# Patient Record
Sex: Male | Born: 1955 | Race: White | Hispanic: No | Marital: Married | Smoking: Former smoker
Health system: Southern US, Community
[De-identification: ages and names within clinical notes are randomized; demographics above are authoritative.]

## PROBLEM LIST (undated history)

## (undated) ENCOUNTER — Emergency Department (HOSPITAL_COMMUNITY): Payer: Self-pay | Source: Home / Self Care

## (undated) DIAGNOSIS — Z8719 Personal history of other diseases of the digestive system: Secondary | ICD-10-CM

## (undated) DIAGNOSIS — K219 Gastro-esophageal reflux disease without esophagitis: Secondary | ICD-10-CM

## (undated) DIAGNOSIS — H53419 Scotoma involving central area, unspecified eye: Secondary | ICD-10-CM

## (undated) DIAGNOSIS — G629 Polyneuropathy, unspecified: Secondary | ICD-10-CM

## (undated) DIAGNOSIS — G4733 Obstructive sleep apnea (adult) (pediatric): Secondary | ICD-10-CM

## (undated) DIAGNOSIS — H269 Unspecified cataract: Secondary | ICD-10-CM

## (undated) DIAGNOSIS — Z860100 Personal history of colon polyps, unspecified: Secondary | ICD-10-CM

## (undated) DIAGNOSIS — E119 Type 2 diabetes mellitus without complications: Secondary | ICD-10-CM

## (undated) DIAGNOSIS — H409 Unspecified glaucoma: Secondary | ICD-10-CM

## (undated) DIAGNOSIS — D509 Iron deficiency anemia, unspecified: Secondary | ICD-10-CM

## (undated) DIAGNOSIS — Z87442 Personal history of urinary calculi: Secondary | ICD-10-CM

## (undated) DIAGNOSIS — I639 Cerebral infarction, unspecified: Secondary | ICD-10-CM

## (undated) DIAGNOSIS — Z973 Presence of spectacles and contact lenses: Secondary | ICD-10-CM

## (undated) DIAGNOSIS — K5790 Diverticulosis of intestine, part unspecified, without perforation or abscess without bleeding: Secondary | ICD-10-CM

## (undated) DIAGNOSIS — K5792 Diverticulitis of intestine, part unspecified, without perforation or abscess without bleeding: Secondary | ICD-10-CM

## (undated) DIAGNOSIS — N189 Chronic kidney disease, unspecified: Secondary | ICD-10-CM

## (undated) DIAGNOSIS — F419 Anxiety disorder, unspecified: Secondary | ICD-10-CM

## (undated) DIAGNOSIS — D649 Anemia, unspecified: Secondary | ICD-10-CM

## (undated) DIAGNOSIS — T7840XA Allergy, unspecified, initial encounter: Secondary | ICD-10-CM

## (undated) HISTORY — DX: Diverticulitis of intestine, part unspecified, without perforation or abscess without bleeding: K57.92

## (undated) HISTORY — DX: Unspecified glaucoma: H40.9

## (undated) HISTORY — DX: Unspecified cataract: H26.9

## (undated) HISTORY — PX: COLONOSCOPY: SHX174

## (undated) HISTORY — DX: Allergy, unspecified, initial encounter: T78.40XA

## (undated) HISTORY — DX: Type 2 diabetes mellitus without complications: E11.9

## (undated) HISTORY — DX: Gastro-esophageal reflux disease without esophagitis: K21.9

## (undated) HISTORY — PX: OTHER SURGICAL HISTORY: SHX169

## (undated) HISTORY — DX: Diverticulosis of intestine, part unspecified, without perforation or abscess without bleeding: K57.90

## (undated) HISTORY — DX: Anemia, unspecified: D64.9

## (undated) HISTORY — DX: Cerebral infarction, unspecified: I63.9

## (undated) HISTORY — PX: UPPER GASTROINTESTINAL ENDOSCOPY: SHX188

## (undated) HISTORY — PX: WISDOM TOOTH EXTRACTION: SHX21

---

## 2001-05-24 ENCOUNTER — Encounter: Admission: RE | Admit: 2001-05-24 | Discharge: 2001-06-23 | Payer: Self-pay | Admitting: Family Medicine

## 2003-09-29 ENCOUNTER — Encounter: Admission: RE | Admit: 2003-09-29 | Discharge: 2003-09-29 | Payer: Self-pay | Admitting: General Surgery

## 2003-10-20 ENCOUNTER — Ambulatory Visit (HOSPITAL_COMMUNITY): Admission: RE | Admit: 2003-10-20 | Discharge: 2003-10-20 | Payer: Self-pay | Admitting: Gastroenterology

## 2008-03-16 ENCOUNTER — Ambulatory Visit: Payer: Self-pay | Admitting: Gastroenterology

## 2008-03-16 DIAGNOSIS — K5900 Constipation, unspecified: Secondary | ICD-10-CM | POA: Insufficient documentation

## 2008-03-16 DIAGNOSIS — E119 Type 2 diabetes mellitus without complications: Secondary | ICD-10-CM | POA: Insufficient documentation

## 2008-03-16 DIAGNOSIS — Z794 Long term (current) use of insulin: Secondary | ICD-10-CM | POA: Insufficient documentation

## 2008-04-12 ENCOUNTER — Encounter: Payer: Self-pay | Admitting: Gastroenterology

## 2008-04-12 ENCOUNTER — Telehealth: Payer: Self-pay | Admitting: Gastroenterology

## 2008-04-18 ENCOUNTER — Telehealth: Payer: Self-pay | Admitting: Gastroenterology

## 2008-04-19 ENCOUNTER — Ambulatory Visit: Payer: Self-pay | Admitting: Gastroenterology

## 2009-04-18 ENCOUNTER — Emergency Department (HOSPITAL_COMMUNITY): Admission: EM | Admit: 2009-04-18 | Discharge: 2009-04-18 | Payer: Self-pay | Admitting: Family Medicine

## 2011-03-04 LAB — POCT URINALYSIS DIP (DEVICE)
Bilirubin Urine: NEGATIVE
Glucose, UA: 500 mg/dL — AB
Hgb urine dipstick: NEGATIVE
Ketones, ur: NEGATIVE mg/dL
Nitrite: NEGATIVE
Protein, ur: NEGATIVE mg/dL
Specific Gravity, Urine: 1.01 (ref 1.005–1.030)
Urobilinogen, UA: 0.2 mg/dL (ref 0.0–1.0)
pH: 5.5 (ref 5.0–8.0)

## 2012-02-09 ENCOUNTER — Encounter: Payer: Self-pay | Admitting: Cardiology

## 2012-09-01 ENCOUNTER — Encounter: Payer: Self-pay | Admitting: Gastroenterology

## 2012-09-24 ENCOUNTER — Encounter: Payer: Self-pay | Admitting: Gastroenterology

## 2012-10-04 ENCOUNTER — Ambulatory Visit (INDEPENDENT_AMBULATORY_CARE_PROVIDER_SITE_OTHER): Payer: 59 | Admitting: Gastroenterology

## 2012-10-04 ENCOUNTER — Encounter: Payer: Self-pay | Admitting: Gastroenterology

## 2012-10-04 VITALS — BP 126/60 | HR 104 | Ht 69.0 in | Wt 198.8 lb

## 2012-10-04 DIAGNOSIS — K219 Gastro-esophageal reflux disease without esophagitis: Secondary | ICD-10-CM | POA: Insufficient documentation

## 2012-10-04 DIAGNOSIS — R1032 Left lower quadrant pain: Secondary | ICD-10-CM

## 2012-10-04 MED ORDER — OMEPRAZOLE-SODIUM BICARBONATE 40-1100 MG PO CAPS: ORAL_CAPSULE | ORAL | Status: DC

## 2012-10-04 NOTE — Assessment & Plan Note (Addendum)
Pt has long-standing reflux. He's currently more symptomatic, primarily at night. Although he's been taking ibuprofen for couple of years, this may be contributing to his symptoms.  Recommendations #1 D/C protonix and begin Zegerid 40 mg every morning and each bedtime #2 pt was instructed to take his sinus medication earlier in the evening #3 if symptoms are not improved we'll consider holding Advil and possibly doing an upper endoscopy

## 2012-10-04 NOTE — Patient Instructions (Addendum)
Discontinue Protonix Follow in one month Call back in 1 week Take advil early in the day as needed We are giving you Zegerid samples today

## 2012-10-04 NOTE — Progress Notes (Signed)
History of Present Illness: Mr. Fandrich is a pleasant 56 year old white male with diabetes and history of esophageal stricture here for evaluation of pyrosis. Symptoms are primarily nocturnal. He's been taking Protonix twice a day for years. He also takes a sinus medication that contains ibuprofen before going to bed. He's been on this regimen for a couple of years as well. He denies dysphagia sore throat or cough. Claims to have had several episodes of low-grade diverticulitis manifested by discomfort in the left lower quadrant. He's been on antibiotics in the past for this. Endoscopy 2004 demonstrated a distal esophageal stricture which was dilated.     Past Medical History  Diagnosis Date  . DM (diabetes mellitus)   . Kidney stones   . Diverticulosis   . Diverticulitis   . GERD (gastroesophageal reflux disease)   . Cataracts, bilateral    Past Surgical History  Procedure Date  . Cataract surgery     bilateral   family history includes Colon polyps in his father and Diabetes in his mother and sister. Current Outpatient Prescriptions  Medication Sig Dispense Refill  . gabapentin (NEURONTIN) 300 MG capsule Take 300 mg by mouth daily.      Marland Kitchen glimepiride (AMARYL) 4 MG tablet Take 4 mg by mouth 2 (two) times daily.      . Liraglutide (VICTOZA Caroline) Inject 1.8 mLs into the skin daily.      . metFORMIN (GLUCOPHAGE) 1000 MG tablet Take 500 mg by mouth 2 (two) times daily with a meal. Take 4 tablets by mouth every night      . Pantoprazole Sodium (PROTONIX PO) Take 1 tablet by mouth 2 (two) times daily.      . sertraline (ZOLOFT) 25 MG tablet Take 25 mg by mouth daily.       Allergies as of 10/04/2012 - Review Complete 10/04/2012  Allergen Reaction Noted  . Aspirin  03/16/2008  . Penicillins  03/16/2008    reports that he quit smoking about 15 years ago. He has never used smokeless tobacco. He reports that he does not drink alcohol or use illicit drugs.     Review of Systems: Pertinent  positive and negative review of systems were noted in the above HPI section. All other review of systems were otherwise negative.  Vital signs were reviewed in today's medical record Physical Exam: General: Well developed , well nourished, no acute distress Head: Normocephalic and atraumatic Eyes:  sclerae anicteric, EOMI Ears: Normal auditory acuity Mouth: No deformity or lesions Neck: Supple, no masses or thyromegaly Lungs: Clear throughout to auscultation Heart: Regular rate and rhythm; no murmurs, rubs or bruits Abdomen: Soft, non tender and non distended. No masses, hepatosplenomegaly or hernias noted. Normal Bowel sounds. There is diastases recti Rectal:deferred Musculoskeletal: Symmetrical with no gross deformities  Skin: No lesions on visible extremities Pulses:  Normal pulses noted Extremities: No clubbing, cyanosis, edema or deformities noted Neurological: Alert oriented x 4, grossly nonfocal Cervical Nodes:  No significant cervical adenopathy Inguinal Nodes: No significant inguinal adenopathy Psychological:  Alert and cooperative. Normal mood and affect

## 2012-10-04 NOTE — Assessment & Plan Note (Signed)
The patient has a history of intermittent left lower quadrant pain attributed to diverticulitis.  Last colonoscopy 2009.  Recommendations #1 no further GI workup. Should he should have recurrent pain would consider CT scan to document diverticulitis.

## 2012-11-08 ENCOUNTER — Ambulatory Visit (INDEPENDENT_AMBULATORY_CARE_PROVIDER_SITE_OTHER): Payer: 59 | Admitting: Gastroenterology

## 2012-11-08 ENCOUNTER — Encounter: Payer: Self-pay | Admitting: Gastroenterology

## 2012-11-08 VITALS — BP 120/64 | HR 100 | Ht 69.0 in | Wt 196.0 lb

## 2012-11-08 DIAGNOSIS — K219 Gastro-esophageal reflux disease without esophagitis: Secondary | ICD-10-CM

## 2012-11-08 MED ORDER — OMEPRAZOLE-SODIUM BICARBONATE 40-1100 MG PO CAPS: ORAL_CAPSULE | ORAL | Status: DC

## 2012-11-08 NOTE — Progress Notes (Signed)
History of Present Illness:  The patient has returned for followup of pyrosis. On a regimen of Zegerid before breakfast and dinner he is symptom-free. He has no GI complaints.    Review of Systems: Pertinent positive and negative review of systems were noted in the above HPI section. All other review of systems were otherwise negative.    Current Medications, Allergies, Past Medical History, Past Surgical History, Family History and Social History were reviewed in [employer] electronic medical record  Vital signs were reviewed in today's medical record. Physical Exam: General: Well developed , well nourished, no acute distress

## 2012-11-08 NOTE — Assessment & Plan Note (Signed)
Symptoms are well controlled on twice a day Zegerid. Plan to reduce to once daily in the evenings. Patient is at liberty to switch to Zegerid every morning if he prefers.

## 2012-11-08 NOTE — Patient Instructions (Addendum)
Follow up as needed 90 day supply of Zegerid sent to [facility]

## 2013-01-17 ENCOUNTER — Telehealth: Payer: Self-pay | Admitting: Gastroenterology

## 2013-01-17 ENCOUNTER — Other Ambulatory Visit: Payer: Self-pay | Admitting: Gastroenterology

## 2013-01-17 DIAGNOSIS — H4010X0 Unspecified open-angle glaucoma, stage unspecified: Secondary | ICD-10-CM | POA: Insufficient documentation

## 2013-01-17 DIAGNOSIS — R109 Unspecified abdominal pain: Secondary | ICD-10-CM

## 2013-01-17 DIAGNOSIS — H53419 Scotoma involving central area, unspecified eye: Secondary | ICD-10-CM | POA: Insufficient documentation

## 2013-01-17 MED ORDER — CIPROFLOXACIN HCL 500 MG PO TABS: 500.0000 mg | ORAL_TABLET | Freq: Two times a day (BID) | ORAL | Status: AC

## 2013-01-17 NOTE — Telephone Encounter (Signed)
Begin Cipro 500 mg twice a day and schedule CT of the abdomen and pelvis. Carefully instructed to contact me if she's not improving over the next 24-48 hours

## 2013-01-17 NOTE — Telephone Encounter (Signed)
Pt states he is having a flare of his diverticulitis. Pt states that over the weekend he started having stomach pain, loose stools and chills. Pt is requesting some antibiotics be called in for him. Please advise.

## 2013-01-17 NOTE — Telephone Encounter (Signed)
Pt aware and rx sent to pharmacy. 

## 2013-05-16 ENCOUNTER — Other Ambulatory Visit: Payer: Self-pay | Admitting: Gastroenterology

## 2013-06-03 ENCOUNTER — Telehealth: Payer: Self-pay | Admitting: Gastroenterology

## 2013-06-06 NOTE — Telephone Encounter (Signed)
Dr Arlyce Dice, the CT was not ordered in February , 2014 after a Diverticulitis attack; he did take the Cipro. At that time he was having a fever and cramping. Today, he reports bouts of diarrhea and cramping now and then, but nothing like February; states he's avoiding salads. Do we order the CT since he's basicall asymptomatic? Thanks.

## 2013-06-06 NOTE — Telephone Encounter (Signed)
Informed pt no need for CT scan and he stated understanding.

## 2013-06-06 NOTE — Telephone Encounter (Signed)
No need for CT scan.

## 2013-12-09 ENCOUNTER — Other Ambulatory Visit: Payer: Self-pay | Admitting: Gastroenterology

## 2014-12-11 ENCOUNTER — Telehealth: Payer: Self-pay | Admitting: Gastroenterology

## 2014-12-11 MED ORDER — OMEPRAZOLE-SODIUM BICARBONATE 40-1100 MG PO CAPS: ORAL_CAPSULE | ORAL | Status: DC

## 2014-12-11 NOTE — Telephone Encounter (Signed)
LM that med was sent

## 2015-01-10 ENCOUNTER — Other Ambulatory Visit: Payer: Self-pay | Admitting: *Deleted

## 2015-01-10 MED ORDER — OMEPRAZOLE 40 MG PO CPDR: 40.0000 mg | DELAYED_RELEASE_CAPSULE | Freq: Two times a day (BID) | ORAL | Status: DC

## 2015-01-10 MED ORDER — SODIUM BICARBONATE 650 MG PO TABS: 650.0000 mg | ORAL_TABLET | Freq: Two times a day (BID) | ORAL | Status: DC

## 2015-01-31 ENCOUNTER — Ambulatory Visit (INDEPENDENT_AMBULATORY_CARE_PROVIDER_SITE_OTHER): Payer: 59 | Admitting: Gastroenterology

## 2015-01-31 ENCOUNTER — Encounter: Payer: Self-pay | Admitting: Gastroenterology

## 2015-01-31 VITALS — BP 130/70 | HR 104 | Ht 69.0 in | Wt 192.1 lb

## 2015-01-31 DIAGNOSIS — R12 Heartburn: Secondary | ICD-10-CM

## 2015-01-31 DIAGNOSIS — K219 Gastro-esophageal reflux disease without esophagitis: Secondary | ICD-10-CM

## 2015-01-31 NOTE — Patient Instructions (Signed)
Gastroesophageal Reflux Disease, Adult  Gastroesophageal reflux disease (GERD) happens when acid from your stomach flows up into the esophagus. When acid comes in contact with the esophagus, the acid causes soreness (inflammation) in the esophagus. Over time, GERD may create small holes (ulcers) in the lining of the esophagus.  CAUSES   · Increased body weight. This puts pressure on the stomach, making acid rise from the stomach into the esophagus.  · Smoking. This increases acid production in the stomach.  · Drinking alcohol. This causes decreased pressure in the lower esophageal sphincter (valve or ring of muscle between the esophagus and stomach), allowing acid from the stomach into the esophagus.  · Late evening meals and a full stomach. This increases pressure and acid production in the stomach.  · A malformed lower esophageal sphincter.  Sometimes, no cause is found.  SYMPTOMS   · Burning pain in the lower part of the mid-chest behind the breastbone and in the mid-stomach area. This may occur twice a week or more often.  · Trouble swallowing.  · Sore throat.  · Dry cough.  · Asthma-like symptoms including chest tightness, shortness of breath, or wheezing.  DIAGNOSIS   Your caregiver may be able to diagnose GERD based on your symptoms. In some cases, X-rays and other tests may be done to check for complications or to check the condition of your stomach and esophagus.  TREATMENT   Your caregiver may recommend over-the-counter or prescription medicines to help decrease acid production. Ask your caregiver before starting or adding any new medicines.   HOME CARE INSTRUCTIONS   · Change the factors that you can control. Ask your caregiver for guidance concerning weight loss, quitting smoking, and alcohol consumption.  · Avoid foods and drinks that make your symptoms worse, such as:  ¨ Caffeine or alcoholic drinks.  ¨ Chocolate.  ¨ Peppermint or mint flavorings.  ¨ Garlic and onions.  ¨ Spicy foods.  ¨ Citrus fruits,  such as oranges, lemons, or limes.  ¨ Tomato-based foods such as sauce, chili, salsa, and pizza.  ¨ Fried and fatty foods.  · Avoid lying down for the 3 hours prior to your bedtime or prior to taking a nap.  · Eat small, frequent meals instead of large meals.  · Wear loose-fitting clothing. Do not wear anything tight around your waist that causes pressure on your stomach.  · Raise the head of your bed 6 to 8 inches with wood blocks to help you sleep. Extra pillows will not help.  · Only take over-the-counter or prescription medicines for pain, discomfort, or fever as directed by your caregiver.  · Do not take aspirin, ibuprofen, or other nonsteroidal anti-inflammatory drugs (NSAIDs).  SEEK IMMEDIATE MEDICAL CARE IF:   · You have pain in your arms, neck, jaw, teeth, or back.  · Your pain increases or changes in intensity or duration.  · You develop nausea, vomiting, or sweating (diaphoresis).  · You develop shortness of breath, or you faint.  · Your vomit is green, yellow, black, or looks like coffee grounds or blood.  · Your stool is red, bloody, or black.  These symptoms could be signs of other problems, such as heart disease, gastric bleeding, or esophageal bleeding.  MAKE SURE YOU:   · Understand these instructions.  · Will watch your condition.  · Will get help right away if you are not doing well or get worse.  Document Released: 08/20/2005 Document Revised: 02/02/2012 Document Reviewed: 05/30/2011  ExitCare® Patient   Information ©2015 [employer]. This information is not intended to replace advice given to you by your health care provider. Make sure you discuss any questions you have with your health care provider.  Food Choices for Gastroesophageal Reflux Disease  When you have gastroesophageal reflux disease (GERD), the foods you eat and your eating habits are very important. Choosing the right foods can help ease the discomfort of GERD.  WHAT GENERAL GUIDELINES DO I NEED TO FOLLOW?  · Choose fruits,  vegetables, whole grains, low-fat dairy products, and low-fat meat, fish, and poultry.  · Limit fats such as oils, salad dressings, butter, nuts, and avocado.  · Keep a food diary to identify foods that cause symptoms.  · Avoid foods that cause reflux. These may be different for different people.  · Eat frequent small meals instead of three large meals each day.  · Eat your meals slowly, in a relaxed setting.  · Limit fried foods.  · Cook foods using methods other than frying.  · Avoid drinking alcohol.  · Avoid drinking large amounts of liquids with your meals.  · Avoid bending over or lying down until 2-3 hours after eating.  WHAT FOODS ARE NOT RECOMMENDED?  The following are some foods and drinks that may worsen your symptoms:  Vegetables  Tomatoes. Tomato juice. Tomato and spaghetti sauce. Chili peppers. Onion and garlic. Horseradish.  Fruits  Oranges, grapefruit, and lemon (fruit and juice).  Meats  High-fat meats, fish, and poultry. This includes hot dogs, ribs, ham, sausage, salami, and bacon.  Dairy  Whole milk and chocolate milk. Sour cream. Cream. Butter. Ice cream. Cream cheese.   Beverages  Coffee and tea, with or without caffeine. Carbonated beverages or energy drinks.  Condiments  Hot sauce. Barbecue sauce.   Sweets/Desserts  Chocolate and cocoa. Donuts. Peppermint and spearmint.  Fats and Oils  High-fat foods, including French fries and potato chips.  Other  Vinegar. Strong spices, such as black pepper, white pepper, red pepper, cayenne, curry powder, cloves, ginger, and chili powder.  The items listed above may not be a complete list of foods and beverages to avoid. Contact your dietitian for more information.  Document Released: 11/10/2005 Document Revised: 11/15/2013 Document Reviewed: 09/14/2013  ExitCare® Patient Information ©2015 [employer]. This information is not intended to replace advice given to you by your health care provider. Make sure you discuss any questions you have with your  health care provider.

## 2015-01-31 NOTE — Progress Notes (Signed)
      History of Present Illness:  [name] has reflux for which she is taking omeprazole.  He takes antacids as needed.  In 2004 a distal esophageal stricture was dilated.  With dietary discretion reflux symptoms are well controlled.    Review of Systems: Pertinent positive and negative review of systems were noted in the above HPI section. All other review of systems were otherwise negative.    Current Medications, Allergies, Past Medical History, Past Surgical History, Family History and Social History were reviewed in [place] record  Vital signs were reviewed in today's medical record. Physical Exam: General: Well developed , well nourished, no acute distress Skin: anicteric Head: Normocephalic and atraumatic Eyes:  sclerae anicteric, EOMI Ears: Normal auditory acuity Mouth: No deformity or lesions Lungs: Clear throughout to auscultation Heart: Regular rate and rhythm; no murmurs, rubs or bruits Abdomen: Soft, non tender and non distended. No masses, hepatosplenomegaly or hernias noted. Normal Bowel sounds Rectal:deferred Musculoskeletal: Symmetrical with no gross deformities  Pulses:  Normal pulses noted Extremities: No clubbing, cyanosis, edema or deformities noted Neurological: Alert oriented x 4, grossly nonfocal Psychological:  Alert and cooperative. Normal mood and affect  See Assessment and Plan under Problem List

## 2015-01-31 NOTE — Assessment & Plan Note (Signed)
Symptoms well-controlled with omeprazole.  He has occasional breakthrough pyrosis for which she takes antacids.  Plan to continue current therapy.

## 2015-03-23 ENCOUNTER — Telehealth: Payer: Self-pay | Admitting: Gastroenterology

## 2015-03-23 NOTE — Telephone Encounter (Signed)
Called back and left a message in the voicemail. Asked if she would fax the patient's records to [phone]

## 2015-03-26 NOTE — Telephone Encounter (Signed)
Records received. Patient will see Adam Quinn 03/29/15. Beverlee Nims will contact the patient.

## 2015-03-29 ENCOUNTER — Ambulatory Visit: Payer: 59 | Admitting: Nurse Practitioner

## 2015-04-13 ENCOUNTER — Ambulatory Visit (INDEPENDENT_AMBULATORY_CARE_PROVIDER_SITE_OTHER): Payer: 59 | Admitting: Nurse Practitioner

## 2015-04-13 ENCOUNTER — Encounter: Payer: Self-pay | Admitting: Nurse Practitioner

## 2015-04-13 VITALS — BP 130/64 | HR 88 | Ht 68.0 in | Wt 191.0 lb

## 2015-04-13 DIAGNOSIS — K219 Gastro-esophageal reflux disease without esophagitis: Secondary | ICD-10-CM

## 2015-04-13 DIAGNOSIS — D509 Iron deficiency anemia, unspecified: Secondary | ICD-10-CM | POA: Diagnosis not present

## 2015-04-13 NOTE — Progress Notes (Signed)
HPI :  Patient is a 59 year old male known remotely to Dr. Deatra Ina. He had a screening colonoscopy May 2009 with findings only diverticulosis. Patient is referred by PCP for evaluation of iron deficiency anemia. Labs late April revealed hemoglobin of 9.7, MCV of 70. Patient states that 3 Hemoccult cards were negative a few months back. He was started on iron process 6 months ago but only takes it when he remembers which is usually 2-3 times a week. Patient has taken 4 ibuprofen at bedtime for many years. He is on twice daily PPI for history of GERD  Past Medical History  Diagnosis Date  . DM (diabetes mellitus)   . Kidney stones   . Diverticulosis   . Diverticulitis   . GERD (gastroesophageal reflux disease)   . Hyperlipemia     Family History  Problem Relation Age of Onset  . Colon polyps Father   . Diabetes Mother   . Diabetes Sister     x 2   History  Substance Use Topics  . Smoking status: Former Smoker    Quit date: 10/04/1997  . Smokeless tobacco: Never Used  . Alcohol Use: No   Current Outpatient Prescriptions  Medication Sig Dispense Refill  . gabapentin (NEURONTIN) 300 MG capsule Take 300 mg by mouth daily.    Marland Kitchen glimepiride (AMARYL) 4 MG tablet Take 4 mg by mouth 2 (two) times daily.    . Liraglutide (VICTOZA Terminous) Inject 1.8 mLs into the skin daily.    Marland Kitchen lisinopril (PRINIVIL,ZESTRIL) 5 MG tablet Take 5 mg by mouth daily.    . metFORMIN (GLUCOPHAGE) 1000 MG tablet Take 500 mg by mouth 2 (two) times daily with a meal. Take 4 tablets by mouth every night    . omeprazole (PRILOSEC) 40 MG capsule Take 1 capsule (40 mg total) by mouth 2 (two) times daily. 60 capsule 3  . sertraline (ZOLOFT) 25 MG tablet Take 100 mg by mouth daily.     . simvastatin (ZOCOR) 20 MG tablet Take 20 mg by mouth every evening.    . sodium bicarbonate 650 MG tablet Take 1 tablet (650 mg total) by mouth 2 (two) times daily. 60 tablet 3   No current facility-administered medications for this  visit.   Allergies  Allergen Reactions  . Aspirin   . Penicillins      Review of Systems: All systems reviewed and negative except where noted in HPI.   Physical Exam: BP 130/64 mmHg  Pulse 88  Ht 5\' 8"  (1.727 m)  Wt 191 lb (86.637 kg)  BMI 29.05 kg/m2 Constitutional: Pleasant,well-developed, white male in no acute distress. HEENT: Normocephalic and atraumatic. Conjunctivae are normal. No scleral icterus. Neck supple.  Cardiovascular: Normal rate, regular rhythm.  Pulmonary/chest: Effort normal and breath sounds normal. No wheezing, rales or rhonchi. Abdominal: Soft, nondistended, nontender. Bowel sounds active throughout. There are no masses palpable. No hepatomegaly. Extremities: no edema Lymphadenopathy: No cervical adenopathy noted. Neurological: Alert and oriented to person place and time. Skin: Skin is warm and dry. No rashes noted. Psychiatric: Normal mood and affect. Behavior is normal.   ASSESSMENT AND PLAN:  59 year-old male with microcytic anemia. Apparently started on iron 6 months ago but only taking it 2-3 times a week. No overt bleeding. Patient states 3 Hemoccult cards were negative a few months back. He does take 4 ibuprofen a day. For further evaluation of microcytic anemia patient will be scheduled for upper endoscopy. His last colonoscopy was in 2007, I  think is reasonable to proceed with repeat colonoscopy as well. The risks, benefits, and alternatives to EGD and colonoscopy with possible biopsy and possible polypectomy were discussed with the patient and he consents to proceed.   CC: Kelton Pillar, MD

## 2015-04-13 NOTE — Patient Instructions (Signed)
After you discuss this with your wife, call us and we will be glad to schedule you for the Endoscopy and Colonoscopy. Whether it is at [facility] or our [facility], in our building.  Regarding insurance costs,  With your wife being a [employer] Employee, the procedures would be cheaper done in our facility.

## 2015-04-16 NOTE — Progress Notes (Signed)
Reviewed and agree with management. Sandy Salaam. Deatra Ina, M.D., [facility] [-'-[id number]\ AZG" JHGoiuytre

## 2015-04-17 ENCOUNTER — Telehealth: Payer: Self-pay | Admitting: Gastroenterology

## 2015-04-17 ENCOUNTER — Other Ambulatory Visit: Payer: Self-pay

## 2015-04-17 DIAGNOSIS — Z1211 Encounter for screening for malignant neoplasm of colon: Secondary | ICD-10-CM

## 2015-04-17 DIAGNOSIS — D509 Iron deficiency anemia, unspecified: Secondary | ICD-10-CM

## 2015-04-17 NOTE — Telephone Encounter (Signed)
Beth can you help me with this? Im in clinic with Adam Quinn and Im bringing patients back tomorrow. Until the afternoon. I dont think we have any hospital spots

## 2015-04-17 NOTE — Telephone Encounter (Signed)
Left message for patient to call back. Double procedure scheduled for 06/04/15 arrive at 11:00 am to [facility].

## 2015-04-18 NOTE — Telephone Encounter (Signed)
Patient is checking the date to be certain his wife can be there for him.

## 2015-05-07 ENCOUNTER — Telehealth: Payer: Self-pay | Admitting: Gastroenterology

## 2015-05-07 NOTE — Telephone Encounter (Signed)
Returned [employer].  Need authorization to refill patients Omeprazole  Refilled with 3 refills

## 2015-05-21 ENCOUNTER — Ambulatory Visit (AMBULATORY_SURGERY_CENTER): Payer: Self-pay | Admitting: *Deleted

## 2015-05-21 VITALS — Ht 68.0 in | Wt 192.4 lb

## 2015-05-21 DIAGNOSIS — D509 Iron deficiency anemia, unspecified: Secondary | ICD-10-CM

## 2015-05-21 NOTE — Progress Notes (Signed)
No egg or soy allergy No issues with past sedation No diet pills No home 02 emmi declined

## 2015-05-29 ENCOUNTER — Encounter (HOSPITAL_COMMUNITY): Payer: Self-pay | Admitting: *Deleted

## 2015-05-30 ENCOUNTER — Telehealth: Payer: Self-pay | Admitting: Gastroenterology

## 2015-05-30 MED ORDER — SODIUM BICARBONATE 650 MG PO TABS: 650.0000 mg | ORAL_TABLET | Freq: Two times a day (BID) | ORAL | Status: DC

## 2015-05-30 NOTE — Telephone Encounter (Signed)
Medication sent.

## 2015-06-04 ENCOUNTER — Encounter (HOSPITAL_COMMUNITY): Payer: Self-pay | Admitting: *Deleted

## 2015-06-04 ENCOUNTER — Ambulatory Visit (HOSPITAL_COMMUNITY)
Admission: RE | Admit: 2015-06-04 | Discharge: 2015-06-04 | Disposition: A | Discharge status: Home / Self Care | Payer: 59 | Source: Ambulatory Visit | Attending: Gastroenterology | Admitting: Gastroenterology

## 2015-06-04 ENCOUNTER — Ambulatory Visit (HOSPITAL_COMMUNITY): Payer: 59 | Admitting: Anesthesiology

## 2015-06-04 ENCOUNTER — Encounter (HOSPITAL_COMMUNITY)
Admission: RE | Disposition: A | Discharge status: Home / Self Care | Payer: Self-pay | Source: Ambulatory Visit | Attending: Gastroenterology

## 2015-06-04 DIAGNOSIS — Z87891 Personal history of nicotine dependence: Secondary | ICD-10-CM | POA: Insufficient documentation

## 2015-06-04 DIAGNOSIS — K2970 Gastritis, unspecified, without bleeding: Secondary | ICD-10-CM | POA: Diagnosis not present

## 2015-06-04 DIAGNOSIS — D120 Benign neoplasm of cecum: Secondary | ICD-10-CM | POA: Diagnosis not present

## 2015-06-04 DIAGNOSIS — K219 Gastro-esophageal reflux disease without esophagitis: Secondary | ICD-10-CM | POA: Insufficient documentation

## 2015-06-04 DIAGNOSIS — K2990 Gastroduodenitis, unspecified, without bleeding: Secondary | ICD-10-CM

## 2015-06-04 DIAGNOSIS — K5730 Diverticulosis of large intestine without perforation or abscess without bleeding: Secondary | ICD-10-CM | POA: Insufficient documentation

## 2015-06-04 DIAGNOSIS — D509 Iron deficiency anemia, unspecified: Secondary | ICD-10-CM | POA: Diagnosis present

## 2015-06-04 DIAGNOSIS — Z8371 Family history of colonic polyps: Secondary | ICD-10-CM | POA: Diagnosis not present

## 2015-06-04 DIAGNOSIS — Z1211 Encounter for screening for malignant neoplasm of colon: Secondary | ICD-10-CM

## 2015-06-04 DIAGNOSIS — E119 Type 2 diabetes mellitus without complications: Secondary | ICD-10-CM | POA: Diagnosis not present

## 2015-06-04 DIAGNOSIS — Z79899 Other long term (current) drug therapy: Secondary | ICD-10-CM | POA: Insufficient documentation

## 2015-06-04 DIAGNOSIS — E785 Hyperlipidemia, unspecified: Secondary | ICD-10-CM | POA: Diagnosis not present

## 2015-06-04 DIAGNOSIS — K648 Other hemorrhoids: Secondary | ICD-10-CM | POA: Diagnosis not present

## 2015-06-04 DIAGNOSIS — Z791 Long term (current) use of non-steroidal anti-inflammatories (NSAID): Secondary | ICD-10-CM | POA: Diagnosis not present

## 2015-06-04 HISTORY — PX: ESOPHAGOGASTRODUODENOSCOPY: SHX5428

## 2015-06-04 HISTORY — PX: COLONOSCOPY: SHX5424

## 2015-06-04 HISTORY — DX: Personal history of urinary calculi: Z87.442

## 2015-06-04 HISTORY — DX: Anxiety disorder, unspecified: F41.9

## 2015-06-04 HISTORY — DX: Chronic kidney disease, unspecified: N18.9

## 2015-06-04 SURGERY — EGD (ESOPHAGOGASTRODUODENOSCOPY)
Anesthesia: Monitor Anesthesia Care

## 2015-06-04 MED ORDER — PROPOFOL 10 MG/ML IV BOLUS
INTRAVENOUS | Status: AC
  Filled 2015-06-04: qty 20

## 2015-06-04 MED ORDER — LACTATED RINGERS IV SOLN
INTRAVENOUS | Status: DC
  Administered 2015-06-04: 1000 mL via INTRAVENOUS

## 2015-06-04 MED ORDER — SODIUM CHLORIDE 0.9 % IV SOLN: INTRAVENOUS | Status: DC

## 2015-06-04 MED ORDER — PROPOFOL INFUSION 10 MG/ML OPTIME
INTRAVENOUS | Status: DC | PRN
  Administered 2015-06-04: 300 ug/kg/min via INTRAVENOUS

## 2015-06-04 NOTE — Op Note (Signed)
[facility] [place], [address]   ENDOSCOPY PROCEDURE REPORT  PATIENT: Adam, Quinn  MR#: [id number] BIRTHDATE: 18-Apr-1956 , 58  yrs. old GENDER: male ENDOSCOPIST: Inda Castle, MD REFERRED BY:  Kelton Pillar, M.D. PROCEDURE DATE:  06/04/2015 PROCEDURE:  EGD, diagnostic ASA CLASS:     Class II INDICATIONS:  iron deficiency anemia.  shouldn't had previously been taking NSAIDs MEDICATIONS: Residual sedation present and Monitored anesthesia care  TOPICAL ANESTHETIC:  DESCRIPTION OF PROCEDURE: After the risks benefits and alternatives of the procedure were thoroughly explained, informed consent was obtained.  The    endoscope was introduced through the mouth and advanced to the second portion of the duodenum , Without limitations.  The instrument was slowly withdrawn as the mucosa was fully examined.    STOMACH: Moderate non-erosive gastritis (inflammation) was found in the gastric antrum.   Except for the findings listed, the EGD was otherwise normal.  Retroflexed views revealed no abnormalities. The scope was then withdrawn from the patient and the procedure completed.  COMPLICATIONS: There were no immediate complications.  ENDOSCOPIC IMPRESSION: 1.   Non-erosive gastritis (inflammation) was found in the gastric antrum 2.   EGD was otherwise normal    RECOMMENDATIONS: follow-up Hemoccults in 10-14 days CBC  REPEAT EXAM:  eSigned:  Inda Castle, MD 06/04/2015 1:18 PM    CC:

## 2015-06-04 NOTE — Discharge Instructions (Signed)
Esophagogastroduodenoscopy °Care After °Refer to this sheet in the next few weeks. These instructions provide you with information on caring for yourself after your procedure. Your caregiver may also give you more specific instructions. Your treatment has been planned according to current medical practices, but problems sometimes occur. Call your caregiver if you have any problems or questions after your procedure.  °HOME CARE INSTRUCTIONS °· Do not eat or drink anything until the numbing medicine (local anesthetic) has worn off and your gag reflex has returned. You will know that the local anesthetic has worn off when you can swallow comfortably. °· Do not drive for 12 hours after the procedure or as directed by your caregiver. °· Only take medicines as directed by your caregiver. °SEEK MEDICAL CARE IF:  °· You cannot stop coughing. °· You are not urinating at all or less than usual. °SEEK IMMEDIATE MEDICAL CARE IF: °· You have difficulty swallowing. °· You cannot eat or drink. °· You have worsening throat or chest pain. °· You have dizziness, lightheadedness, or you faint. °· You have nausea or vomiting. °· You have chills. °· You have a fever. °· You have severe abdominal pain. °· You have black, tarry, or bloody stools. °Document Released: 10/27/2012 Document Reviewed: 10/27/2012 °ExitCare® Patient Information ©2015 [employer]. This information is not intended to replace advice given to you by your health care provider. Make sure you discuss any questions you have with your health care provider. ° °Colonoscopy, Care After °Refer to this sheet in the next few weeks. These instructions provide you with information on caring for yourself after your procedure. Your health care provider may also give you more specific instructions. Your treatment has been planned according to current medical practices, but problems sometimes occur. Call your health care provider if you have any problems or questions after your  procedure. °WHAT TO EXPECT AFTER THE PROCEDURE  °After your procedure, it is typical to have the following: °· A small amount of blood in your stool. °· Moderate amounts of gas and mild abdominal cramping or bloating. °HOME CARE INSTRUCTIONS °· Do not drive, operate machinery, or sign important documents for 24 hours. °· You may shower and resume your regular physical activities, but move at a slower pace for the first 24 hours. °· Take frequent rest periods for the first 24 hours. °· Walk around or put a warm pack on your abdomen to help reduce abdominal cramping and bloating. °· Drink enough fluids to keep your urine clear or pale yellow. °· You may resume your normal diet as instructed by your health care provider. Avoid heavy or fried foods that are hard to digest. °· Avoid drinking alcohol for 24 hours or as instructed by your health care provider. °· Only take over-the-counter or prescription medicines as directed by your health care provider. °· If a tissue sample (biopsy) was taken during your procedure: °¨ Do not take aspirin or blood thinners for 7 days, or as instructed by your health care provider. °¨ Do not drink alcohol for 7 days, or as instructed by your health care provider. °¨ Eat soft foods for the first 24 hours. °SEEK MEDICAL CARE IF: °You have persistent spotting of blood in your stool 2-3 days after the procedure. °SEEK IMMEDIATE MEDICAL CARE IF: °· You have more than a small spotting of blood in your stool. °· You pass large blood clots in your stool. °· Your abdomen is swollen (distended). °· You have nausea or vomiting. °· You have a   fever. °· You have increasing abdominal pain that is not relieved with medicine. °Document Released: 06/24/2004 Document Revised: 08/31/2013 Document Reviewed: 07/18/2013 °ExitCare® Patient Information ©2015 [employer]. This information is not intended to replace advice given to you by your health care provider. Make sure you discuss any questions you have  with your health care provider. ° ° ° °

## 2015-06-04 NOTE — Anesthesia Procedure Notes (Signed)
Procedure Name: MAC Date/Time: 06/04/2015 12:37 PM Performed by: Dione Booze Pre-anesthesia Checklist: Patient identified, Emergency Drugs available, Suction available and Patient being monitored Patient Re-evaluated:Patient Re-evaluated prior to inductionOxygen Delivery Method: Nasal cannula Placement Confirmation: positive ETCO2

## 2015-06-04 NOTE — H&P (Signed)
Patient is a 59 year old male known remotely to Dr. Deatra Ina. He had a screening colonoscopy May 2009 with findings only diverticulosis. Patient is referred by PCP for evaluation of iron deficiency anemia. Labs late April revealed hemoglobin of 9.7, MCV of 70. Patient states that 3 Hemoccult cards were negative a few months back. He was started on iron process 6 months ago but only takes it when he remembers which is usually 2-3 times a week. Patient has taken 4 ibuprofen at bedtime for many years. He is on twice daily PPI for history of GERD  Past Medical History  Diagnosis Date  . DM (diabetes mellitus)   . Kidney stones   . Diverticulosis   . Diverticulitis   . GERD (gastroesophageal reflux disease)   . Hyperlipemia     Family History  Problem Relation Age of Onset  . Colon polyps Father   . Diabetes Mother   . Diabetes Sister     x 2   History  Substance Use Topics  . Smoking status: Former Smoker    Quit date: 10/04/1997  . Smokeless tobacco: Never Used  . Alcohol Use: No   Current Outpatient Prescriptions  Medication Sig Dispense Refill  . gabapentin (NEURONTIN) 300 MG capsule Take 300 mg by mouth daily.    Marland Kitchen glimepiride (AMARYL) 4 MG tablet Take 4 mg by mouth 2 (two) times daily.    . Liraglutide (VICTOZA [place]) Inject 1.8 mLs into the skin daily.    Marland Kitchen lisinopril (PRINIVIL,ZESTRIL) 5 MG tablet Take 5 mg by mouth daily.    . metFORMIN (GLUCOPHAGE) 1000 MG tablet Take 500 mg by mouth 2 (two) times daily with a meal. Take 4 tablets by mouth every night    . omeprazole (PRILOSEC) 40 MG capsule Take 1 capsule (40 mg total) by mouth 2 (two) times daily. 60 capsule 3  . sertraline (ZOLOFT) 25 MG tablet Take 100 mg by mouth daily.     . simvastatin (ZOCOR) 20 MG tablet Take 20 mg by mouth every evening.    . sodium bicarbonate 650 MG tablet Take 1 tablet (650 mg total) by mouth 2  (two) times daily. 60 tablet 3   No current facility-administered medications for this visit.   Allergies  Allergen Reactions  . Aspirin   . Penicillins      Review of Systems: All systems reviewed and negative except where noted in HPI.   Physical Exam: BP 130/64 mmHg  Pulse 88  Ht 5\' 8"  (1.727 m)  Wt 191 lb (86.637 kg)  BMI 29.05 kg/m2 Constitutional: Pleasant,well-developed, white male in no acute distress. HEENT: Normocephalic and atraumatic. Conjunctivae are normal. No scleral icterus. Neck supple.  Cardiovascular: Normal rate, regular rhythm.  Pulmonary/chest: Effort normal and breath sounds normal. No wheezing, rales or rhonchi. Abdominal: Soft, nondistended, nontender. Bowel sounds active throughout. There are no masses palpable. No hepatomegaly. Extremities: no edema Lymphadenopathy: No cervical adenopathy noted. Neurological: Alert and oriented to person place and time. Skin: Skin is warm and dry. No rashes noted. Psychiatric: Normal mood and affect. Behavior is normal.   ASSESSMENT AND PLAN:  59 year-old male with microcytic anemia. Apparently started on iron 6 months ago but only taking it 2-3 times a week. No overt bleeding. Patient states 3 Hemoccult cards were negative a few months back. He does take 4 ibuprofen a day. For further evaluation of microcytic anemia patient will be scheduled for upper endoscopy. His last colonoscopy was in 2007, I think is reasonable to proceed  with repeat colonoscopy as well. The risks, benefits, and alternatives to EGD and colonoscopy with possible biopsy and possible polypectomy were discussed with the patient and he consents to proceed.

## 2015-06-04 NOTE — Transfer of Care (Signed)
Immediate Anesthesia Transfer of Care Note  Patient: Adam Quinn  Procedure(s) Performed: Procedure(s): ESOPHAGOGASTRODUODENOSCOPY (EGD) (N/A) COLONOSCOPY (N/A)  Patient Location: PACU and Endoscopy Unit  Anesthesia Type:MAC  Level of Consciousness: awake, alert , oriented and patient cooperative  Airway & Oxygen Therapy: Patient Spontanous Breathing and Patient connected to nasal cannula oxygen  Post-op Assessment: Report given to RN and Post -op Vital signs reviewed and stable  Post vital signs: Reviewed and stable  Last Vitals:  Filed Vitals:   06/04/15 1102  BP: 174/86  Temp: 37.1 C  Resp: 16    Complications: No apparent anesthesia complications

## 2015-06-04 NOTE — Op Note (Addendum)
[facility] [place], [address]   COLONOSCOPY PROCEDURE REPORT     EXAM DATE: 06/04/2015  PATIENT NAME:      Adam Quinn, Adam Quinn           MR #:      [id number]  BIRTHDATE:       Nov 28, 1955      VISIT #:     [id number]  ATTENDING:     Inda Castle, MD     STATUS:     outpatient ASSISTANT:      Cletis Athens and Dustin Flock  INDICATIONS:  The patient is a 59 yr old male here for a colonoscopy due to iron deficiency anemia. PROCEDURE PERFORMED:     Colonoscopy with snare polypectomy MEDICATIONS:     Monitored anesthesia care ESTIMATED BLOOD LOSS:     None  CONSENT: The patient understands the risks and benefits of the procedure and understands that these risks include, but are not limited to: sedation, allergic reaction, infection, perforation and/or bleeding. Alternative means of evaluation and treatment include, among others: physical exam, x-rays, and/or surgical intervention. The patient elects to proceed with this endoscopic procedure.  DESCRIPTION OF PROCEDURE: During intra-op preparation period all mechanical & medical equipment was checked for proper function. Hand hygiene and appropriate measures for infection prevention was taken. After the risks, benefits and alternatives of the procedure were thoroughly explained, Informed consent was verified, confirmed and timeout was successfully executed by the treatment team. A digital exam revealed no abnormalities of the rectum. The EC-[id number] ([id number]) endoscope was introduced through the anus and advanced to the cecum, which was identified by both the appendix and ileocecal valve. (Suprep was used) good. The instrument was then slowly withdrawn as the colon was fully examined.Estimated blood loss is zero unless otherwise noted in this procedure report. COLON FINDINGS: A semi-pedunculated polyp measuring 15 mm in size was found at the cecum.  A polypectomy was performed using  snare cautery.  The resection was complete but the polyp tissue was not retrieved.   There was moderate diverticulosis noted in the ascending colon, at the cecum, in the transverse colon, sigmoid colon, and descending colon.   Internal hemorrhoids were found. Retroflexed views revealed no abnormalities. The scope was then completely withdrawn from the patient and the procedure terminated.  SCOPE WITHDRAWAL TIME:    ADVERSE EVENTS:      There were no immediate complications.  IMPRESSIONS:     1.  Semi-pedunculated polyp was found at the cecum; polypectomy was performed using snare cautery 2.  There was moderate diverticulosis noted in the ascending colon, at the cecum, in the transverse colon, sigmoid colon, and descending colon 3.  Internal hemorrhoids  RECOMMENDATIONS:     Repeat Colonoscopy in 3 years. RECALL:  _____________________________ Inda Castle, MD eSigned:  Inda Castle, MD 06/04/2015 1:18 PM Revised: 06/04/2015 1:18 PM  cc:  Kelton Pillar, MD   CPT CODES: ICD CODES:  The ICD and CPT codes recommended by this software are interpretations from the data that the clinical staff has captured with the software.  The verification of the translation of this report to the ICD and CPT codes and modifiers is the sole responsibility of the health care institution and practicing physician where this report was generated.  [employer] will not be held responsible for the validity of the ICD and CPT codes included on this report.  AMA assumes no liability for data contained or  not contained herein. CPT is a Designer, television/film set of the [employer].   PATIENT NAME:  Adam Quinn MR#: [id number]

## 2015-06-04 NOTE — Anesthesia Preprocedure Evaluation (Signed)
Anesthesia Evaluation  Patient identified by MRN, date of birth, ID band Patient awake    Reviewed: Allergy & Precautions, NPO status , Patient's Chart, lab work & pertinent test results  Airway Mallampati: II  TM Distance: >3 FB Neck ROM: Full    Dental no notable dental hx.    Pulmonary former smoker,  breath sounds clear to auscultation  Pulmonary exam normal       Cardiovascular negative cardio ROS Normal cardiovascular examRhythm:Regular Rate:Normal     Neuro/Psych negative neurological ROS  negative psych ROS   GI/Hepatic negative GI ROS, Neg liver ROS,   Endo/Other  diabetes, Type 2, Oral Hypoglycemic Agents  Renal/GU negative Renal ROS  negative genitourinary   Musculoskeletal negative musculoskeletal ROS (+)   Abdominal   Peds negative pediatric ROS (+)  Hematology negative hematology ROS (+)   Anesthesia Other Findings   Reproductive/Obstetrics negative OB ROS                             Anesthesia Physical Anesthesia Plan  ASA: II  Anesthesia Plan: MAC   Post-op Pain Management:    Induction:   Airway Management Planned: Natural Airway  Additional Equipment:   Intra-op Plan:   Post-operative Plan:   Informed Consent: I have reviewed the patients History and Physical, chart, labs and discussed the procedure including the risks, benefits and alternatives for the proposed anesthesia with the patient or authorized representative who has indicated his/her understanding and acceptance.   Dental advisory given  Plan Discussed with: CRNA  Anesthesia Plan Comments:         Anesthesia Quick Evaluation

## 2015-06-05 ENCOUNTER — Encounter (HOSPITAL_COMMUNITY): Payer: Self-pay | Admitting: Gastroenterology

## 2015-06-05 NOTE — Anesthesia Postprocedure Evaluation (Signed)
  Anesthesia Post-op Note  Patient: Adam Quinn  Procedure(s) Performed: Procedure(s) (LRB): ESOPHAGOGASTRODUODENOSCOPY (EGD) (N/A) COLONOSCOPY (N/A)  Patient Location: PACU  Anesthesia Type: MAC  Level of Consciousness: awake and alert   Airway and Oxygen Therapy: Patient Spontanous Breathing  Post-op Pain: mild  Post-op Assessment: Post-op Vital signs reviewed, Patient's Cardiovascular Status Stable, Respiratory Function Stable, Patent Airway and No signs of Nausea or vomiting  Last Vitals:  Filed Vitals:   06/04/15 1339  BP: 161/77  Pulse: 88  Temp:   Resp: 14    Post-op Vital Signs: stable   Complications: No apparent anesthesia complications

## 2015-06-11 ENCOUNTER — Other Ambulatory Visit: Payer: Self-pay

## 2015-06-11 DIAGNOSIS — D509 Iron deficiency anemia, unspecified: Secondary | ICD-10-CM

## 2015-06-12 ENCOUNTER — Telehealth: Payer: Self-pay

## 2015-06-12 NOTE — Telephone Encounter (Signed)
Patient had an EGD on 06/04/15. He needs follow up CBC and Hemoccult cards as per procedure note. Hemoccult cards mailed to the patient. Left a message on his Voice mail. DPR on file.

## 2015-10-16 ENCOUNTER — Encounter: Payer: Self-pay | Admitting: Gastroenterology

## 2015-12-03 DIAGNOSIS — E291 Testicular hypofunction: Secondary | ICD-10-CM | POA: Diagnosis not present

## 2015-12-24 DIAGNOSIS — E291 Testicular hypofunction: Secondary | ICD-10-CM | POA: Diagnosis not present

## 2016-01-14 DIAGNOSIS — E291 Testicular hypofunction: Secondary | ICD-10-CM | POA: Diagnosis not present

## 2016-02-04 DIAGNOSIS — E291 Testicular hypofunction: Secondary | ICD-10-CM | POA: Diagnosis not present

## 2016-03-10 DIAGNOSIS — E291 Testicular hypofunction: Secondary | ICD-10-CM | POA: Diagnosis not present

## 2016-03-20 DIAGNOSIS — N183 Chronic kidney disease, stage 3 (moderate): Secondary | ICD-10-CM | POA: Diagnosis not present

## 2016-03-20 DIAGNOSIS — E785 Hyperlipidemia, unspecified: Secondary | ICD-10-CM | POA: Diagnosis not present

## 2016-03-20 DIAGNOSIS — E1142 Type 2 diabetes mellitus with diabetic polyneuropathy: Secondary | ICD-10-CM | POA: Diagnosis not present

## 2016-03-20 DIAGNOSIS — F39 Unspecified mood [affective] disorder: Secondary | ICD-10-CM | POA: Diagnosis not present

## 2016-03-20 DIAGNOSIS — E291 Testicular hypofunction: Secondary | ICD-10-CM | POA: Diagnosis not present

## 2016-03-20 DIAGNOSIS — I129 Hypertensive chronic kidney disease with stage 1 through stage 4 chronic kidney disease, or unspecified chronic kidney disease: Secondary | ICD-10-CM | POA: Diagnosis not present

## 2016-03-20 DIAGNOSIS — R002 Palpitations: Secondary | ICD-10-CM | POA: Diagnosis not present

## 2016-03-20 DIAGNOSIS — N5201 Erectile dysfunction due to arterial insufficiency: Secondary | ICD-10-CM | POA: Diagnosis not present

## 2016-03-20 DIAGNOSIS — Z0000 Encounter for general adult medical examination without abnormal findings: Secondary | ICD-10-CM | POA: Diagnosis not present

## 2016-03-20 DIAGNOSIS — D509 Iron deficiency anemia, unspecified: Secondary | ICD-10-CM | POA: Diagnosis not present

## 2016-03-27 ENCOUNTER — Other Ambulatory Visit (HOSPITAL_COMMUNITY): Payer: Self-pay | Admitting: Family Medicine

## 2016-03-27 DIAGNOSIS — R202 Paresthesia of skin: Secondary | ICD-10-CM

## 2016-03-27 DIAGNOSIS — R209 Unspecified disturbances of skin sensation: Secondary | ICD-10-CM

## 2016-03-31 DIAGNOSIS — E291 Testicular hypofunction: Secondary | ICD-10-CM | POA: Diagnosis not present

## 2016-04-03 ENCOUNTER — Ambulatory Visit (HOSPITAL_COMMUNITY)
Admission: RE | Admit: 2016-04-03 | Discharge: 2016-04-03 | Disposition: A | Discharge status: Home / Self Care | Payer: 59 | Source: Ambulatory Visit | Attending: Family Medicine | Admitting: Family Medicine

## 2016-04-03 DIAGNOSIS — M4802 Spinal stenosis, cervical region: Secondary | ICD-10-CM | POA: Diagnosis not present

## 2016-04-03 DIAGNOSIS — I6502 Occlusion and stenosis of left vertebral artery: Secondary | ICD-10-CM | POA: Diagnosis not present

## 2016-04-03 DIAGNOSIS — R209 Unspecified disturbances of skin sensation: Secondary | ICD-10-CM

## 2016-04-03 DIAGNOSIS — R202 Paresthesia of skin: Secondary | ICD-10-CM | POA: Diagnosis not present

## 2016-04-03 MED ORDER — GADOBENATE DIMEGLUMINE 529 MG/ML IV SOLN
19.0000 mL | Freq: Once | INTRAVENOUS | Status: AC | PRN
  Administered 2016-04-03: 19 mL via INTRAVENOUS

## 2016-04-22 DIAGNOSIS — E291 Testicular hypofunction: Secondary | ICD-10-CM | POA: Diagnosis not present

## 2016-05-12 DIAGNOSIS — E291 Testicular hypofunction: Secondary | ICD-10-CM | POA: Diagnosis not present

## 2016-05-14 DIAGNOSIS — R200 Anesthesia of skin: Secondary | ICD-10-CM | POA: Diagnosis not present

## 2016-05-14 DIAGNOSIS — Z6829 Body mass index (BMI) 29.0-29.9, adult: Secondary | ICD-10-CM | POA: Diagnosis not present

## 2016-05-14 DIAGNOSIS — R030 Elevated blood-pressure reading, without diagnosis of hypertension: Secondary | ICD-10-CM | POA: Diagnosis not present

## 2016-05-16 ENCOUNTER — Other Ambulatory Visit (HOSPITAL_COMMUNITY): Payer: Self-pay | Admitting: Neurological Surgery

## 2016-05-16 DIAGNOSIS — R200 Anesthesia of skin: Secondary | ICD-10-CM

## 2016-05-22 ENCOUNTER — Ambulatory Visit (HOSPITAL_COMMUNITY)
Admission: RE | Admit: 2016-05-22 | Discharge: 2016-05-22 | Disposition: A | Discharge status: Home / Self Care | Payer: 59 | Source: Ambulatory Visit | Attending: Neurological Surgery | Admitting: Neurological Surgery

## 2016-05-22 DIAGNOSIS — R200 Anesthesia of skin: Secondary | ICD-10-CM

## 2016-05-22 DIAGNOSIS — I639 Cerebral infarction, unspecified: Secondary | ICD-10-CM | POA: Diagnosis not present

## 2016-05-22 LAB — POCT I-STAT CREATININE: Creatinine, Ser: 1.3 mg/dL — ABNORMAL HIGH (ref 0.61–1.24)

## 2016-05-22 MED ORDER — GADOBENATE DIMEGLUMINE 529 MG/ML IV SOLN
20.0000 mL | Freq: Once | INTRAVENOUS | Status: AC | PRN
  Administered 2016-05-22: 18 mL via INTRAVENOUS

## 2016-06-02 DIAGNOSIS — E291 Testicular hypofunction: Secondary | ICD-10-CM | POA: Diagnosis not present

## 2016-06-04 DIAGNOSIS — Z6829 Body mass index (BMI) 29.0-29.9, adult: Secondary | ICD-10-CM | POA: Diagnosis not present

## 2016-06-04 DIAGNOSIS — I639 Cerebral infarction, unspecified: Secondary | ICD-10-CM | POA: Diagnosis not present

## 2016-06-19 DIAGNOSIS — H401123 Primary open-angle glaucoma, left eye, severe stage: Secondary | ICD-10-CM | POA: Diagnosis not present

## 2016-06-19 DIAGNOSIS — E113292 Type 2 diabetes mellitus with mild nonproliferative diabetic retinopathy without macular edema, left eye: Secondary | ICD-10-CM | POA: Diagnosis not present

## 2016-06-19 DIAGNOSIS — H5213 Myopia, bilateral: Secondary | ICD-10-CM | POA: Diagnosis not present

## 2016-06-19 DIAGNOSIS — H40051 Ocular hypertension, right eye: Secondary | ICD-10-CM | POA: Diagnosis not present

## 2016-06-19 DIAGNOSIS — Z961 Presence of intraocular lens: Secondary | ICD-10-CM | POA: Diagnosis not present

## 2016-06-19 DIAGNOSIS — H401113 Primary open-angle glaucoma, right eye, severe stage: Secondary | ICD-10-CM | POA: Diagnosis not present

## 2016-06-23 DIAGNOSIS — E291 Testicular hypofunction: Secondary | ICD-10-CM | POA: Diagnosis not present

## 2016-06-26 ENCOUNTER — Encounter: Payer: Self-pay | Admitting: Neurology

## 2016-06-26 ENCOUNTER — Ambulatory Visit (INDEPENDENT_AMBULATORY_CARE_PROVIDER_SITE_OTHER): Payer: 59 | Admitting: Neurology

## 2016-06-26 DIAGNOSIS — I639 Cerebral infarction, unspecified: Secondary | ICD-10-CM

## 2016-06-26 DIAGNOSIS — R200 Anesthesia of skin: Secondary | ICD-10-CM | POA: Diagnosis not present

## 2016-06-26 DIAGNOSIS — I6502 Occlusion and stenosis of left vertebral artery: Secondary | ICD-10-CM

## 2016-06-26 DIAGNOSIS — I6381 Other cerebral infarction due to occlusion or stenosis of small artery: Secondary | ICD-10-CM | POA: Insufficient documentation

## 2016-06-26 DIAGNOSIS — I6509 Occlusion and stenosis of unspecified vertebral artery: Secondary | ICD-10-CM | POA: Insufficient documentation

## 2016-06-26 MED ORDER — CLOPIDOGREL BISULFATE 75 MG PO TABS: 75.0000 mg | ORAL_TABLET | Freq: Every day | ORAL | 11 refills | Status: DC

## 2016-06-26 NOTE — Patient Instructions (Signed)
I had a long d/w patient and wife about his recent stroke, risk for recurrent stroke/TIAs, personally independently reviewed imaging studies and stroke evaluation results and answered questions.check CT angiogram of the neck to evaluate left vertebral artery occlusion and polysomnogram for sleep apnea. Start Plavix 75 mg daily  for secondary stroke prevention and maintain strict control of hypertension with blood pressure goal below 130/90, diabetes with hemoglobin A1c goal below 6.5% and lipids with LDL cholesterol goal below 70 mg/dL. I also advised the patient to eat a healthy diet with plenty of whole grains, cereals, fruits and vegetables, exercise regularly and maintain ideal body weight Followup in the future with me in 3 months or call earlier if necessary.  Stroke Prevention Some medical conditions and behaviors are associated with an increased chance of having a stroke. You may prevent a stroke by making healthy choices and managing medical conditions. HOW CAN I REDUCE MY RISK OF HAVING A STROKE?   Stay physically active. Get at least 30 minutes of activity on most or all days.  Do not smoke. It may also be helpful to avoid exposure to secondhand smoke.  Limit alcohol use. Moderate alcohol use is considered to be:  No more than 2 drinks per day for men.  No more than 1 drink per day for nonpregnant women.  Eat healthy foods. This involves:  Eating 5 or more servings of fruits and vegetables a day.  Making dietary changes that address high blood pressure (hypertension), high cholesterol, diabetes, or obesity.  Manage your cholesterol levels.  Making food choices that are high in fiber and low in saturated fat, trans fat, and cholesterol may control cholesterol levels.  Take any prescribed medicines to control cholesterol as directed by your health care provider.  Manage your diabetes.  Controlling your carbohydrate and sugar intake is recommended to manage diabetes.  Take any  prescribed medicines to control diabetes as directed by your health care provider.  Control your hypertension.  Making food choices that are low in salt (sodium), saturated fat, trans fat, and cholesterol is recommended to manage hypertension.  Ask your health care provider if you need treatment to lower your blood pressure. Take any prescribed medicines to control hypertension as directed by your health care provider.  If you are 30-67 years of age, have your blood pressure checked every 3-5 years. If you are 75 years of age or older, have your blood pressure checked every year.  Maintain a healthy weight.  Reducing calorie intake and making food choices that are low in sodium, saturated fat, trans fat, and cholesterol are recommended to manage weight.  Stop drug abuse.  Avoid taking birth control pills.  Talk to your health care provider about the risks of taking birth control pills if you are over 25 years old, smoke, get migraines, or have ever had a blood clot.  Get evaluated for sleep disorders (sleep apnea).  Talk to your health care provider about getting a sleep evaluation if you snore a lot or have excessive sleepiness.  Take medicines only as directed by your health care provider.  For some people, aspirin or blood thinners (anticoagulants) are helpful in reducing the risk of forming abnormal blood clots that can lead to stroke. If you have the irregular heart rhythm of atrial fibrillation, you should be on a blood thinner unless there is a good reason you cannot take them.  Understand all your medicine instructions.  Make sure that other conditions (such as anemia or atherosclerosis)  are addressed. SEEK IMMEDIATE MEDICAL CARE IF:   You have sudden weakness or numbness of the face, arm, or leg, especially on one side of the body.  Your face or eyelid droops to one side.  You have sudden confusion.  You have trouble speaking (aphasia) or understanding.  You have  sudden trouble seeing in one or both eyes.  You have sudden trouble walking.  You have dizziness.  You have a loss of balance or coordination.  You have a sudden, severe headache with no known cause.  You have new chest pain or an irregular heartbeat. Any of these symptoms may represent a serious problem that is an emergency. Do not wait to see if the symptoms will go away. Get medical help at once. Call your local emergency services (911 in [place] Do not drive yourself to the hospital.   This information is not intended to replace advice given to you by your health care provider. Make sure you discuss any questions you have with your health care provider.   Document Released: 12/18/2004 Document Revised: 12/01/2014 Document Reviewed: 05/13/2013 Elsevier Interactive Patient Education [employer].

## 2016-06-26 NOTE — Progress Notes (Signed)
[facility] [address] [place]. [place] [address] [phone]       OFFICE CONSULT NOTE  [name] Date of Birth:  03-Nov-1956 Medical Record Number:  [id number]   Referring MD:  Kristeen Miss  Reason for Referral:  stroke  HPI: Mr [name] is a 28 year Caucasian male who developed sudden onset of left face and neck and arm pain and paresthesias on April 15 while lifting boxes when he was mowing his house. He describes a feeling of sensation of on the top of his head and face. this lasted for a couple of weeks and gradually improved. he did not seek immediate help but eventually size primary physician. tender mri scan cervical spine which i have reviewed which showed mild foraminal degenerative changes without significant compression. he was referred to dr. 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 the left vertebral artery flow void was absent in the right vertebral artery was dominant and from the basilar artery. patient has no significant vascular risk factors except diabetes which appears to be well controlled on last hemoglobin a1c a few months ago was 6.7. he has no history of hypertension. he has been started on statins years ago for more for primary prevention  As part of a studybut has no known history of cardiac disease or strokes. he does not smoke. he does have however family history is multiple family members on both sides having strokes. he is allergic to aspirin. patient does have history of snoring and restless sleep as well as he feels significantly tired upon awakening. he has not yet had a sleep study.  ROS:   14 system review of systems is positive for   hearing loss, ringing in the ears, snoring, importance, cramps, and  anemia, numbness, dizziness, anxiety, decreased energy, snoring and all other systems negative PMH:  Past Medical History:  Diagnosis Date  . Allergy    seasonal  . Anemia   . Anxiety   . Cataract    cataract surgery bilaterally resolved issues  . Chronic kidney disease    small protien showing uses Lisinopril for this  . Diverticulitis   . Diverticulosis   . DM (diabetes mellitus) ([place])   . GERD (gastroesophageal reflux disease)   . Glaucoma   . History of kidney stones    x1  . Stroke [facility])     Social History:  Social History   Social History  . Marital status: Married    Spouse name: N/A  . Number of children: 3  . Years of education: N/A   Occupational History  . consultant    Social History Main Topics  . Smoking status: Former Smoker    Quit date: 10/04/1997  . Smokeless tobacco: Never Used  . Alcohol use No  . Drug use: No  . Sexual activity: Not on file   Other Topics Concern  . Not on file   Social History Narrative  . No narrative on file    Medications:   Current Outpatient Prescriptions on File Prior to Visit  Medication Sig Dispense Refill  . ferrous fumarate (HEMOCYTE - 106 MG FE) 325 (106 FE) MG TABS tablet Take 1 tablet by mouth daily.    Marland Kitchen gabapentin (NEURONTIN) 300 MG capsule Take 300 mg by mouth  daily.    . glimepiride (AMARYL) 4 MG tablet Take 4 mg by mouth 2 (two) times daily.    . Liraglutide (VICTOZA [place]) Inject 1.8 mLs into the skin daily.    Marland Kitchen lisinopril (PRINIVIL,ZESTRIL) 5 MG tablet Take 5 mg by mouth daily.    . Loratadine (ALAVERT PO) Take 1 tablet by mouth at bedtime. Alavert D    . omeprazole (PRILOSEC) 40 MG capsule Take 1 capsule (40 mg total) by mouth 2 (two) times daily. 60 capsule 3  . sertraline (ZOLOFT) 25 MG tablet Take 100 mg by mouth daily.     . simvastatin (ZOCOR) 20 MG tablet Take 20 mg by mouth every evening.    . sodium bicarbonate 650 MG tablet Take 1 tablet (650 mg total) by mouth 2 (two) times daily. 60  tablet 3  . Testosterone 40.5 MG/2.5GM (1.62%) GEL Place 4 Squirts onto the skin daily.    . Travoprost, BAK Free, (TRAVATAN Z) 0.004 % SOLN ophthalmic solution Place 1 drop into both eyes at bedtime.     No current facility-administered medications on file prior to visit.     Allergies:   Allergies  Allergen Reactions  . Aspirin Hives  . Shrimp [Shellfish Allergy] Hives    Just shrimp     Physical Exam General: mildly obese seated, in no evident distress Head: head normocephalic and atraumatic.   Neck: supple with no carotid or supraclavicular bruits Cardiovascular: regular rate and rhythm, no murmurs Musculoskeletal: no deformity Skin:  no rash/petichiae Vascular:  Normal pulses all extremities  Neurologic Exam Mental Status: Awake and fully alert. Oriented to place and time. Recent and remote memory intact. Attention span, concentration and fund of knowledge appropriate. Mood and affect appropriate.  Cranial Nerves: Fundoscopic exam reveals sharp disc margins. Pupils equal, briskly reactive to light. Extraocular movements full without nystagmus. Visual fields full to confrontation. Hearing intact. Facial sensation intact. Face, tongue, palate moves normally and symmetrically.  Motor: Normal bulk and tone. Normal strength in all tested extremity muscles. Sensory.: intact to touch , pinprick , position and vibratory sensation.  Coordination: Rapid alternating movements normal in all extremities. Finger-to-nose and heel-to-shin performed accurately bilaterally. Gait and Station: Arises from chair without difficulty. Stance is normal. Gait demonstrates normal stride length and balance . Able to heel, toe and tandem walk with slight difficulty.  Reflexes: 1+ and symmetric. Toes downgoing.   NIHSS  0 Modified Rankin  1   ASSESSMENT: 89 year Caucasian male with episode of left face and arm pain and numbness likely secondary to a small brainstem infarct. MRI scan of the brain shows  multiple bilateral subcortical lacunar infarcts. Etiology unclear as to small vessel disease or left vertebral  artery occlusion    PLAN: I had a long d/w patient and wife about his recent stroke, risk for recurrent stroke/TIAs, personally independently reviewed imaging studies and stroke evaluation results and answered questions.check CT angiogram of the neck to evaluate left vertebral artery occlusion versus congenital hypolasia and polysomnogram for sleep apnea. Start Plavix 75 mg daily  for secondary stroke prevention and maintain strict control of hypertension with blood pressure goal below 130/90, diabetes with hemoglobin A1c goal below 6.5% and lipids with LDL cholesterol goal below 70 mg/dL. I also advised the patient to eat a healthy diet with plenty of whole grains, cereals, fruits and vegetables, exercise regularly and maintain ideal body weight .greater than 50% time during this 45 and a consultation visit was spent on counseling and coordination  of care about stroke and TIA risk, prevention and treatment Followup in the future with me in 3 months or call earlier if necessary.  Antony Contras, MD  [facility] [address] [place], [place] [address]  Phone [phone] Fax [phone]  Note: This document was prepared with digital dictation and possible smart phrase technology. Any transcriptional errors that result from this process are unintentional.

## 2016-07-02 DIAGNOSIS — H40051 Ocular hypertension, right eye: Secondary | ICD-10-CM | POA: Diagnosis not present

## 2016-07-02 DIAGNOSIS — H401113 Primary open-angle glaucoma, right eye, severe stage: Secondary | ICD-10-CM | POA: Diagnosis not present

## 2016-07-08 ENCOUNTER — Ambulatory Visit (INDEPENDENT_AMBULATORY_CARE_PROVIDER_SITE_OTHER): Payer: 59 | Admitting: Neurology

## 2016-07-08 ENCOUNTER — Encounter: Payer: Self-pay | Admitting: Neurology

## 2016-07-08 VITALS — BP 130/72 | HR 96 | Resp 20 | Ht 68.0 in | Wt 195.0 lb

## 2016-07-08 DIAGNOSIS — G478 Other sleep disorders: Secondary | ICD-10-CM

## 2016-07-08 DIAGNOSIS — G4752 REM sleep behavior disorder: Secondary | ICD-10-CM

## 2016-07-08 DIAGNOSIS — R0683 Snoring: Secondary | ICD-10-CM | POA: Diagnosis not present

## 2016-07-08 NOTE — Addendum Note (Signed)
Addended by: Larey Seat on: 07/08/2016 12:09 PM   Modules accepted: Orders

## 2016-07-08 NOTE — Progress Notes (Signed)
SLEEP MEDICINE CLINIC   Provider:  Larey Seat, M D  Referring Provider: Kelton Pillar, MD Primary Care Physician:  Osborne Casco, MD  Chief Complaint  Patient presents with  . New Patient (Initial Visit)    never had a sleep study, snores at night    HPI:  Adam Quinn is a 60 y.o. male , seen here as a referral from Dr. Leonie Man,  Adam Quinn is a 60 year old, right-handed, Caucasian married male who developed sudden onset of left face and neck as well as left arm pain with paresthesias Tesio has been 03/08/2016. He had just lifted some heavy boxes he was moving. He described a feeling of a sensation on the top of his head and face when from their descending. Interesting is that it lasted a couple of weeks therefore he did not seek immediate help. An MRI scan of the cervical spine has been performed by Dr. Laurann Montana, and there was no significant compression noted and next Dr. Ellene Route ordered an MRI scan of the brain  This was performed on 05/22/2016 and it showed an old left pontine paramedian lacunar infarct and multiple smaller lacunar infarcts bilaterally the left vertebral artery was blocked. Dr. Leonie Man evaluated the vascular risk factors in his previous note. The patient does not smoke has nor has family history of cardiac disease but family history of multiple family members having strokes. He has an allergy to aspirin. Since he reported also a history of snoring and restless sleep and daytime fatigue Dr. Leonie Man had felt it  indicated to order a sleep study.  Adam Quinn reports that in April 2017 he and his wife had a stress full time, caring for his in laws. Soon after they had to move their household. He works as an Passenger transport manager.  Adam Quinn is office bound and very sedentary.   Sleep habits are as follows: The patient usually goes to bed and falls asleep promptly at about 11 PM, his bedroom is cool and quiet and dark. He shares a bed with his wife, who has noted him to  snore loudly and she has also reported that he sits up in bed gasping for breath. He has none or one bathroom break at night this usually does not fragment his sleep. He sleeps usually through until 6 AM in the morning. He relies in an alarm to wake him for work. He sleeps on 2 pillows and he prefers to go to sleep on his right side. He spends a lot of time sleeping supine. Over the last couple of years he has more often acted out upon a dream and he reports plenty of visit dreams. He has hit his wife accidentally he has rash or kicked in bed and he has woken himself sleep talking or yelling. He does not sleep walk. Sometimes he is woken up by his acid reflux condition.   Sleep medical history and family sleep history:  No known family history of sleep disorder.   Social history:  Mr. Files does not drink alcohol, he does not use tobacco products in any form, he drinks Diet Coke with caffeine 4-5 cans a day. No coffee, but used to drink sweet coffee.     Dr.  Clydene Fake ASSESSMENT: 59 year Caucasian male with episode of left face and arm pain and numbness likely secondary to a small brainstem infarct. MRI scan of the brain shows multiple bilateral subcortical lacunar infarcts. Etiology unclear as to small vessel disease or left vertebral  artery  occlusion  PLAN: I had a long d/w patient and wife about his recent stroke, risk for recurrent stroke/TIAs, personally independently reviewed imaging studies and stroke evaluation results and answered questions.check CT angiogram of the neck to evaluate left vertebral artery occlusion versus congenital hypolasia and polysomnogram for sleep apnea. Start Plavix 75 mg daily  for secondary stroke prevention and maintain strict control of hypertension with blood pressure goal below 130/90, diabetes with hemoglobin A1c goal below 6.5% and lipids with LDL cholesterol goal below 70 mg/dL. I also advised the patient to eat a healthy diet with plenty of whole grains,  cereals, fruits and vegetables, exercise regularly and maintain ideal body weight .greater than 50% time during this 45 and a consultation visit was spent on counseling and coordination of care about stroke and TIA risk, prevention and treatment Followup in the future with me in 3 months or call earlier if necessary.  Adam Contras, MD  Review of Systems: Out of a complete 14 system review, the patient complains of only the following symptoms, and all other reviewed systems are negative. Sleep talking, yelling, thrashing and kicking, REM BD, no RLS, cramping in legs.   Epworth score 8-9  , Fatigue severity score 50  , depression score n/a   Social History   Social History  . Marital status: Married    Spouse name: N/A  . Number of children: 3  . Years of education: N/A   Occupational History  . consultant    Social History Main Topics  . Smoking status: Former Smoker    Quit date: 10/04/1997  . Smokeless tobacco: Never Used  . Alcohol use No  . Drug use: No  . Sexual activity: Not on file   Other Topics Concern  . Not on file   Social History Narrative  . No narrative on file    Family History  Problem Relation Age of Onset  . Colon polyps Father   . Lung cancer Father   . Diabetes Mother   . Dementia Mother   . Diabetes Sister     x 2  . Stroke Maternal Grandmother   . Stroke Paternal Grandfather   . Colon cancer Neg Hx   . Rectal cancer Neg Hx   . Stomach cancer Neg Hx     Past Medical History:  Diagnosis Date  . Allergy    seasonal  . Anemia   . Anxiety   . Cataract    cataract surgery bilaterally resolved issues  . Chronic kidney disease    small protien showing uses Lisinopril for this  . Diverticulitis   . Diverticulosis   . DM (diabetes mellitus) ([place])   . GERD (gastroesophageal reflux disease)   . Glaucoma   . History of kidney stones    x1  . Stroke [facility])     Past Surgical History:  Procedure Laterality Date  . cataract surgery      bilateral  . COLONOSCOPY    . COLONOSCOPY N/A 06/04/2015   Procedure: COLONOSCOPY;  Surgeon: Inda Castle, MD;  Location: [facility] ENDOSCOPY;  Service: Endoscopy;  Laterality: N/A;  . ESOPHAGOGASTRODUODENOSCOPY N/A 06/04/2015   Procedure: ESOPHAGOGASTRODUODENOSCOPY (EGD);  Surgeon: Inda Castle, MD;  Location: [facility];  Service: Endoscopy;  Laterality: N/A;  . UPPER GASTROINTESTINAL ENDOSCOPY    . WISDOM TOOTH EXTRACTION     with sedation    Current Outpatient Prescriptions  Medication Sig Dispense Refill  . clopidogrel (PLAVIX) 75 MG tablet Take 1 tablet (75 mg  total) by mouth daily. 30 tablet 11  . Coenzyme Q10 (COQ10 PO) Take 200 mg by mouth 2 (two) times daily.    . ferrous fumarate (HEMOCYTE - 106 MG FE) 325 (106 FE) MG TABS tablet Take 1 tablet by mouth daily.    Marland Kitchen gabapentin (NEURONTIN) 300 MG capsule Take 300 mg by mouth daily.    Marland Kitchen glimepiride (AMARYL) 4 MG tablet Take 4 mg by mouth 2 (two) times daily.    Marland Kitchen ibuprofen (ADVIL,MOTRIN) 800 MG tablet Take 800 mg by mouth every 8 (eight) hours as needed.    . Liraglutide (VICTOZA [place]) Inject 1.8 mLs into the skin daily.    Marland Kitchen lisinopril (PRINIVIL,ZESTRIL) 5 MG tablet Take 5 mg by mouth daily.    . Loratadine (ALAVERT PO) Take 1 tablet by mouth at bedtime. Alavert D    . metFORMIN (GLUCOPHAGE) 500 MG tablet Take 500 mg by mouth.    Marland Kitchen omeprazole (PRILOSEC) 40 MG capsule Take 1 capsule (40 mg total) by mouth 2 (two) times daily. 60 capsule 3  . sertraline (ZOLOFT) 25 MG tablet Take 100 mg by mouth daily.     . simvastatin (ZOCOR) 20 MG tablet Take 20 mg by mouth every evening.    . sodium bicarbonate 650 MG tablet Take 1 tablet (650 mg total) by mouth 2 (two) times daily. 60 tablet 3  . TESTOSTERONE IM Inject into the muscle.    . Travoprost, BAK Free, (TRAVATAN Z) 0.004 % SOLN ophthalmic solution Place 1 drop into both eyes at bedtime.     No current facility-administered medications for this visit.     Allergies as of 07/08/2016  - Review Complete 07/08/2016  Allergen Reaction Noted  . Aspirin Hives 03/16/2008  . Shrimp [shellfish allergy] Hives 05/21/2015    Vitals: BP 130/72   Pulse 96   Resp 20   Ht 5\' 8"  (1.727 m)   Wt 195 lb (88.5 kg)   BMI 29.65 kg/m  Last Weight:  Wt Readings from Last 1 Encounters:  07/08/16 195 lb (88.5 kg)   [id number] mass index is 29.65 kg/m.     Last Height:   Ht Readings from Last 1 Encounters:  07/08/16 5\' 8"  (1.727 m)    Physical exam:  General: The patient is awake, alert and appears not in acute distress. The patient is well groomed. Head: Normocephalic, atraumatic. Neck is supple. Mallampati 4,  neck circumference: 17.5 short and thick. Nasal airflow patent today, TMJ is not  evident . Retrognathia is seen.  Double chin.  Cardiovascular:  Regular rate and rhythm , without  murmurs or carotid bruit, and without distended neck veins. Respiratory: Lungs are clear to auscultation. Skin:  Without evidence of edema, or rash Trunk: BMI is elevated , abdominal and truncal girth. The patient's posture is erect  Neurologic exam : The patient is awake and alert, oriented to place and time.   Memory subjective described as intact.  Attention span & concentration ability appears normal.  Speech is fluent,  without  dysarthria, but with dysphonia or aphasia.  Mood and affect are appropriate.  Cranial nerves: Pupils are equal and briskly reactive to light. Funduscopic exam without evidence of pallor or edema. Extraocular movements  in vertical and horizontal planes intact and without nystagmus. Visual fields by finger perimetry are intact.Hearing to finger rub intact. Facial sensation intact to fine touch.Facial motor strength is symmetric and tongue and uvula move midline. Shoulder shrug was symmetrical.   The patient was advised  of the nature of the diagnosed sleep disorder , the treatment options and risks for general a health and wellness arising from not treating the  condition.  I spent more than 30 minutes of face to face time with the patient. Greater than 50% of time was spent in counseling and coordination of care. We have discussed the diagnosis and differential and I answered the patient's questions.     Assessment:  After physical and neurologic examination, review of laboratory studies,  Personal review of imaging studies, reports of other /same  Imaging studies ,  Results of polysomnography/ neurophysiology testing and pre-existing records as far as provided in visit., my assessment is   1) Mr. Broshears has significant risk factors for obstructive sleep apnea including his body mass index, and his neck circumference. He has a Mallampati grade 4 the tip of the uvula cannot lift above the tongue ground, his tonsils have atrophied he does have swelling of the soft palate and some reddening indicating that he does suffer from acid reflux. This would definitely explain his snoring. As to the degree that apnea may be present I will order a sleep study. Since he has suffered several silent strokes the sleep study will also give [place] evidence of paroxysmal atrial atrial fibrillation, nocturnal blood pressure spikes or heart rate irregularities. Hypoxemia will be evaluated as well he does not have sleep related headaches and therefore I will not need Mammography to be part of the study. He has reported REM behavior and I will ask the technologist to place this patient in a bedroom with excellent video and audio to see if his REM stages are associated with muscle tone abnormalities He is invited to take 5 or 6 mg of melatonin each night to suppress REM behavior disorder until he has a sleep study.    Plan:  Treatment plan and additional workup : SPLIT , no Co2.  REM BD, treat with Melatonin, OTC     Adam Partridge Kataryna Mcquilkin MD  07/08/2016   CC: Kelton Pillar, Md [address] [place], [place] [id number]

## 2016-07-10 ENCOUNTER — Telehealth: Payer: Self-pay | Admitting: Neurology

## 2016-07-10 ENCOUNTER — Other Ambulatory Visit: Payer: Self-pay

## 2016-07-10 DIAGNOSIS — I6502 Occlusion and stenosis of left vertebral artery: Secondary | ICD-10-CM

## 2016-07-10 NOTE — Telephone Encounter (Signed)
Rn call [facility] and spoke with the radiology department. They stated patient needs orders saying I stat creatine. While on the phone the staff stated it was correct. Pt schedule for Ct angio contrast. Orders put in correctly.

## 2016-07-10 NOTE — Addendum Note (Signed)
Addended by: Marval Regal on: 07/10/2016 03:18 PM   Modules accepted: Orders

## 2016-07-10 NOTE — Telephone Encounter (Signed)
[facility] [phone] called to advise, patient is scheduled for 8:00am tomorrow for CT scan with contrast, they do not have a recent Creatinine on patient. If no recent Creatinine, order can be put in epic and they will do when patient gets there tomorrow. Skyped nurse Katrina, she will put order in epic for Creatinine.

## 2016-07-11 ENCOUNTER — Ambulatory Visit (HOSPITAL_COMMUNITY)
Admission: RE | Admit: 2016-07-11 | Discharge: 2016-07-11 | Disposition: A | Discharge status: Home / Self Care | Payer: 59 | Source: Ambulatory Visit | Attending: Neurology | Admitting: Neurology

## 2016-07-11 ENCOUNTER — Encounter (HOSPITAL_COMMUNITY): Payer: Self-pay

## 2016-07-11 DIAGNOSIS — I6502 Occlusion and stenosis of left vertebral artery: Secondary | ICD-10-CM | POA: Diagnosis not present

## 2016-07-11 LAB — POCT I-STAT CREATININE: Creatinine, Ser: 1.3 mg/dL — ABNORMAL HIGH (ref 0.61–1.24)

## 2016-07-11 MED ORDER — IOPAMIDOL (ISOVUE-370) INJECTION 76%
100.0000 mL | Freq: Once | INTRAVENOUS | Status: AC | PRN
  Administered 2016-07-11: 100 mL via INTRAVENOUS

## 2016-07-14 DIAGNOSIS — E291 Testicular hypofunction: Secondary | ICD-10-CM | POA: Diagnosis not present

## 2016-07-17 DIAGNOSIS — H401123 Primary open-angle glaucoma, left eye, severe stage: Secondary | ICD-10-CM | POA: Diagnosis not present

## 2016-08-04 DIAGNOSIS — E291 Testicular hypofunction: Secondary | ICD-10-CM | POA: Diagnosis not present

## 2016-08-13 ENCOUNTER — Ambulatory Visit (INDEPENDENT_AMBULATORY_CARE_PROVIDER_SITE_OTHER): Payer: 59 | Admitting: Neurology

## 2016-08-13 DIAGNOSIS — R0683 Snoring: Secondary | ICD-10-CM

## 2016-08-13 DIAGNOSIS — G4710 Hypersomnia, unspecified: Secondary | ICD-10-CM | POA: Diagnosis not present

## 2016-08-13 DIAGNOSIS — G4752 REM sleep behavior disorder: Secondary | ICD-10-CM

## 2016-08-25 DIAGNOSIS — Z23 Encounter for immunization: Secondary | ICD-10-CM | POA: Diagnosis not present

## 2016-08-25 DIAGNOSIS — E291 Testicular hypofunction: Secondary | ICD-10-CM | POA: Diagnosis not present

## 2016-09-03 ENCOUNTER — Telehealth: Payer: Self-pay

## 2016-09-03 DIAGNOSIS — G4733 Obstructive sleep apnea (adult) (pediatric): Secondary | ICD-10-CM

## 2016-09-03 NOTE — Telephone Encounter (Signed)
I called pt to discuss sleep study results. No answer, left a message asking him to call me back.    

## 2016-09-08 NOTE — Telephone Encounter (Signed)
I spoke to pt regarding his sleep study results. 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 Dr. Brett Fairy recommends coming in for a full-night attended, CPAP titration study to optimize therapy. Dr. Brett Fairy also recommends melatonin to treat the PLMs, and if this doesn't work, pt should try klonopin. Pt is agreeable to a cpap titration study. Pt says that he does not take narcotics so therefore, cannot reduce narcotic dose. I advised pt to avoid caffeine containing beverages and chocolate after lunch time. Pt verbalized understanding of results. Pt had no questions at this time but was encouraged to call back if questions arise.  Pt says that he received an "EOB" which says he owes the sleep lab money. He was under the impression he just needed to pay his copay. I advised him that I would ask the sleep lab/accounting to call him. Pt verbalized understanding.  I spoke to [place] in Billing, and she will call the pt to discuss.

## 2016-09-15 DIAGNOSIS — E291 Testicular hypofunction: Secondary | ICD-10-CM | POA: Diagnosis not present

## 2016-09-22 DIAGNOSIS — I639 Cerebral infarction, unspecified: Secondary | ICD-10-CM | POA: Diagnosis not present

## 2016-09-22 DIAGNOSIS — I129 Hypertensive chronic kidney disease with stage 1 through stage 4 chronic kidney disease, or unspecified chronic kidney disease: Secondary | ICD-10-CM | POA: Diagnosis not present

## 2016-09-22 DIAGNOSIS — G4730 Sleep apnea, unspecified: Secondary | ICD-10-CM | POA: Diagnosis not present

## 2016-09-22 DIAGNOSIS — E1142 Type 2 diabetes mellitus with diabetic polyneuropathy: Secondary | ICD-10-CM | POA: Diagnosis not present

## 2016-09-22 DIAGNOSIS — L723 Sebaceous cyst: Secondary | ICD-10-CM | POA: Diagnosis not present

## 2016-09-22 DIAGNOSIS — E1121 Type 2 diabetes mellitus with diabetic nephropathy: Secondary | ICD-10-CM | POA: Diagnosis not present

## 2016-09-22 DIAGNOSIS — D509 Iron deficiency anemia, unspecified: Secondary | ICD-10-CM | POA: Diagnosis not present

## 2016-09-22 DIAGNOSIS — E11319 Type 2 diabetes mellitus with unspecified diabetic retinopathy without macular edema: Secondary | ICD-10-CM | POA: Diagnosis not present

## 2016-09-24 ENCOUNTER — Ambulatory Visit (INDEPENDENT_AMBULATORY_CARE_PROVIDER_SITE_OTHER): Payer: 59 | Admitting: Neurology

## 2016-09-24 DIAGNOSIS — G4733 Obstructive sleep apnea (adult) (pediatric): Secondary | ICD-10-CM

## 2016-09-26 ENCOUNTER — Telehealth: Payer: Self-pay | Admitting: Neurology

## 2016-09-26 DIAGNOSIS — G4733 Obstructive sleep apnea (adult) (pediatric): Secondary | ICD-10-CM

## 2016-09-26 NOTE — Telephone Encounter (Signed)
CPAP ordered  at 7 cm water with airfit P 10 nasal pillow in small size.

## 2016-09-29 NOTE — Telephone Encounter (Signed)
I spoke to pt.  I advised him that his cpap titration results revealed a good response to cpap titration and Dr. Brett Fairy recommends starting a CPAP. Pt is agreeable to starting a cpap. I advised pt that an order for cpap will be sent to a DME and they will call pt for set up within a week. Pt was advised to not drive or operate hazardous machinery when sleepy. I advised pt to plan dietary and exercise routines to lose weight and reduce caffeine intake. Will send order to [facility]. Pt verbalized understanding of results. A follow up appt was made for 12/22/16 at 11:00am. Pt had no questions at this time but was encouraged to call back if questions arise.

## 2016-09-30 ENCOUNTER — Ambulatory Visit (INDEPENDENT_AMBULATORY_CARE_PROVIDER_SITE_OTHER): Payer: 59 | Admitting: Neurology

## 2016-09-30 ENCOUNTER — Encounter: Payer: Self-pay | Admitting: Neurology

## 2016-09-30 VITALS — BP 130/77 | HR 99 | Ht 68.0 in | Wt 196.4 lb

## 2016-09-30 DIAGNOSIS — I6990 Unspecified sequelae of unspecified cerebrovascular disease: Secondary | ICD-10-CM | POA: Diagnosis not present

## 2016-09-30 NOTE — Progress Notes (Signed)
[facility] [address] [place]. [place] [address] [phone]       OFFICE FOLLOW UP VISIT NOTE  [name] Date of Birth:  01/05/1956 Medical Record Number:  [id number]   Referring MD:  Kristeen Miss  Reason for Referral:  stroke  HPI: Initial Consult 06/26/2016 ;  [name] is a 31 year Caucasian male who developed sudden onset of left face and neck and arm pain and paresthesias on April 15 while lifting boxes when he was mowing his house. He describes a feeling of sensation of on the top of his head and face. this lasted for a couple of weeks and gradually improved. he did not seek immediate help but eventually size primary physician. tender mri scan cervical spine which i have reviewed which showed mild foraminal degenerative changes without significant compression. he was referred to dr. 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 the left vertebral artery flow void was absent in the right vertebral artery was dominant and from the basilar artery. patient has no significant vascular risk factors except diabetes which appears to be well controlled on last hemoglobin a1c a few months ago was 6.7. he has no history of hypertension. he has been started on statins years ago for more for primary prevention  As part of a studybut has no known history of cardiac disease or strokes. he does not smoke. he does have however family history is multiple family members on both sides having strokes. he is allergic to aspirin. patient does have history of snoring and restless sleep as well as he feels significantly tired upon awakening. he has not yet had a sleep study. Update 09/30/2016 ; Patient returns for follow-up after last 3 months ago. He states is doing  well and has not had any recurrent stroke or TIA symptoms. 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 Patient feels as a code fully advanced no deficits. Patient has not been taking Plavix as is concerned about possible interaction with omeprazole which is taking and was pointed out to him by his brother-in-law. Patient states his tried Pepcid before but did not work for him. I advised him to discuss with his primary physician switching to centigrade which he could tolerate value in the past. Patient is allergic to aspirin and cannot be switch to aspirin. He did undergo polysomnogram and was diagnosed with sleep apnea. He has been fitted with a CPAP mask but has not yet received that and plans to start it soon. He was seen by Dr. Brett Fairy for sleep apnea as well. ROS:   14 system review of systems is positive for   hearing loss, ringing in the ears, snoring, importance, cramps, and anemia, numbness, dizziness, anxiety, decreased energy, snoring and all other systems negative PMH:  Past Medical History:  Diagnosis Date  . Allergy    seasonal  . Anemia   . Anxiety   . Cataract    cataract surgery bilaterally resolved issues  . Chronic kidney disease    small protien showing uses Lisinopril for this  . Diverticulitis   . Diverticulosis   . DM (diabetes mellitus) ([place])   . GERD (gastroesophageal reflux disease)   .  Glaucoma   . History of kidney stones    x1  . Stroke [facility])     Social History:  Social History   Social History  . Marital status: Married    Spouse name: N/A  . Number of children: 3  . Years of education: N/A   Occupational History  . consultant    Social History Main Topics  . Smoking status: Former Smoker    Quit date: 10/04/1997  . Smokeless tobacco: Never Used  . Alcohol use No  . Drug use: No  . Sexual activity: Not on file   Other Topics Concern    . Not on file   Social History Narrative  . No narrative on file    Medications:   Current Outpatient Prescriptions on File Prior to Visit  Medication Sig Dispense Refill  . Coenzyme Q10 (COQ10 PO) Take 200 mg by mouth 2 (two) times daily.    . ferrous fumarate (HEMOCYTE - 106 MG FE) 325 (106 FE) MG TABS tablet Take 1 tablet by mouth daily.    Marland Kitchen gabapentin (NEURONTIN) 300 MG capsule Take 300 mg by mouth daily.    Marland Kitchen glimepiride (AMARYL) 4 MG tablet Take 4 mg by mouth 2 (two) times daily.    Marland Kitchen ibuprofen (ADVIL,MOTRIN) 800 MG tablet Take 800 mg by mouth every 8 (eight) hours as needed.    . Liraglutide (VICTOZA [place]) Inject 1.8 mLs into the skin daily.    Marland Kitchen lisinopril (PRINIVIL,ZESTRIL) 5 MG tablet Take 5 mg by mouth daily.    . Loratadine (ALAVERT PO) Take 1 tablet by mouth at bedtime. Alavert D    . omeprazole (PRILOSEC) 40 MG capsule Take 1 capsule (40 mg total) by mouth 2 (two) times daily. 60 capsule 3  . sertraline (ZOLOFT) 25 MG tablet Take 100 mg by mouth daily.     . simvastatin (ZOCOR) 20 MG tablet Take 20 mg by mouth every evening.    . sodium bicarbonate 650 MG tablet Take 1 tablet (650 mg total) by mouth 2 (two) times daily. 60 tablet 3  . TESTOSTERONE IM Inject into the muscle.    . Travoprost, BAK Free, (TRAVATAN Z) 0.004 % SOLN ophthalmic solution Place 1 drop into both eyes at bedtime.    . clopidogrel (PLAVIX) 75 MG tablet Take 1 tablet (75 mg total) by mouth daily. (Patient not taking: Reported on 09/30/2016) 30 tablet 11   No current facility-administered medications on file prior to visit.     Allergies:   Allergies  Allergen Reactions  . Aspirin Hives  . Shrimp [Shellfish Allergy] Hives    Just shrimp     Physical Exam General: mildly obese seated, in no evident distress Head: head normocephalic and atraumatic.   Neck: supple with no carotid or supraclavicular bruits Cardiovascular: regular rate and rhythm, no murmurs Musculoskeletal: no deformity Skin:   no rash/petichiae Vascular:  Normal pulses all extremities  Neurologic Exam Mental Status: Awake and fully alert. Oriented to place and time. Recent and remote memory intact. Attention span, concentration and fund of knowledge appropriate. Mood and affect appropriate.  Cranial Nerves: Fundoscopic exam not done. Pupils equal, briskly reactive to light. Extraocular movements full without nystagmus. Visual fields full to confrontation. Hearing intact. Facial sensation intact. Face, tongue, palate moves normally and symmetrically.  Motor: Normal bulk and tone. Normal strength in all tested extremity muscles. Sensory.: intact to touch , pinprick , position and vibratory sensation.  Coordination: Rapid alternating movements normal in all extremities.  Finger-to-nose and heel-to-shin performed accurately bilaterally. Gait and Station: Arises from chair without difficulty. Stance is normal. Gait demonstrates normal stride length and balance . Able to heel, toe and tandem walk with slight difficulty.  Reflexes: 1+ and symmetric. Toes downgoing.   NIHSS  0 Modified Rankin  1   ASSESSMENT: 58 year Caucasian male with episode of left face and arm pain and numbness likely secondary to a small brainstem infarct. MRI scan of the brain shows multiple bilateral subcortical lacunar infarcts. Etiology unclear as to small vessel disease or left vertebral  artery occlusion. New diagnosis of obstructive sleep apnea     PLAN: I had a long d/w patient and his wife  about his recent stroke, risk for recurrent stroke/TIAs, personally independently reviewed imaging studies and stroke evaluation results and answered questions.Continue Plavix  for secondary stroke prevention and maintain strict control of hypertension with blood pressure goal below 130/90, diabetes with hemoglobin A1c goal below 6.5% and lipids with LDL cholesterol goal below 70 mg/dL. I also counseled the patient to start using CPAP and to be compliant  with it. Follow-up with Dr. Brett Fairy for obstructive sleep apnea treatment I also advised the patient to eat a healthy diet with plenty of whole grains, cereals, fruits and vegetables, exercise regularly and maintain ideal body weight .greater than 50% time during this 25 minute visit was spent on counseling and coordination of care about stroke risk and sleep apnea Followup in the future with my nurse practitioner in 6 months or call earlier if necessary.  Antony Contras, MD  [facility] [address] [place], [place] [address]  Phone [phone] Fax [phone]  Note: This document was prepared with digital dictation and possible smart phrase technology. Any transcriptional errors that result from this process are unintentional.

## 2016-09-30 NOTE — Patient Instructions (Addendum)
Sleep Apnea  Sleep apnea is a sleep disorder characterized by abnormal pauses in breathing while you sleep. When your breathing pauses, the level of oxygen in your blood decreases. This causes you to move out of deep sleep and into light sleep. As a result, your quality of sleep is poor, and the system that carries your blood throughout your body (cardiovascular system) experiences stress. If sleep apnea remains untreated, the following conditions can develop:  High blood pressure (hypertension).  Coronary artery disease.  Inability to achieve or maintain an erection (impotence).  Impairment of your thought process (cognitive dysfunction). There are three types of sleep apnea: 1. Obstructive sleep apnea--Pauses in breathing during sleep because of a blocked airway. 2. Central sleep apnea--Pauses in breathing during sleep because the area of the brain that controls your breathing does not send the correct signals to the muscles that control breathing. 3. Mixed sleep apnea--A combination of both obstructive and central sleep apnea. RISK FACTORS The following risk factors can increase your risk of developing sleep apnea:  Being overweight.  Smoking.  Having narrow passages in your nose and throat.  Being of older age.  Being male.  Alcohol use.  Sedative and tranquilizer use.  Ethnicity. Among individuals younger than 35 years, African Americans are at increased risk of sleep apnea. SYMPTOMS   Difficulty staying asleep.  Daytime sleepiness and fatigue.  Loss of energy.  Irritability.  Loud, heavy snoring.  Morning headaches.  Trouble concentrating.  Forgetfulness.  Decreased interest in sex.  Unexplained sleepiness. DIAGNOSIS  In order to diagnose sleep apnea, your caregiver will perform a physical examination. A sleep study done in the comfort of your own home may be appropriate if you are otherwise healthy. Your caregiver may also recommend that you spend the  night in a sleep lab. In the sleep lab, several monitors record information about your heart, lungs, and brain while you sleep. Your leg and arm movements and blood oxygen level are also recorded. TREATMENT The following actions may help to resolve mild sleep apnea:  Sleeping on your side.   Using a decongestant if you have nasal congestion.   Avoiding the use of depressants, including alcohol, sedatives, and narcotics.   Losing weight and modifying your diet if you are overweight. There also are devices and treatments to help open your airway:  Oral appliances. These are custom-made mouthpieces that shift your lower jaw forward and slightly open your bite. This opens your airway.  Devices that create positive airway pressure. This positive pressure "splints" your airway open to help you breathe better during sleep. The following devices create positive airway pressure:  Continuous positive airway pressure (CPAP) device. The CPAP device creates a continuous level of air pressure with an air pump. The air is delivered to your airway through a mask while you sleep. This continuous pressure keeps your airway open.  Nasal expiratory positive airway pressure (EPAP) device. The EPAP device creates positive air pressure as you exhale. The device consists of single-use valves, which are inserted into each nostril and held in place by adhesive. The valves create very little resistance when you inhale but create much more resistance when you exhale. That increased resistance creates the positive airway pressure. This positive pressure while you exhale keeps your airway open, making it easier to breath when you inhale again.  Bilevel positive airway pressure (BPAP) device. The BPAP device is used mainly in patients with central sleep apnea. This device is similar to the CPAP device because   it also uses an air pump to deliver continuous air pressure through a mask. However, with the BPAP machine, the  pressure is set at two different levels. The pressure when you exhale is lower than the pressure when you inhale.  Surgery. Typically, surgery is only done if you cannot comply with less invasive treatments or if the less invasive treatments do not improve your condition. Surgery involves removing excess tissue in your airway to create a wider passage way.   This information is not intended to replace advice given to you by your health care provider. Make sure you discuss any questions you have with your health care provider.   Document Released: 10/31/2002 Document Revised: 12/01/2014 Document Reviewed: 03/18/2012 Elsevier Interactive Patient Education 2016 [employer]   

## 2016-10-01 NOTE — Progress Notes (Signed)
I agree with the assessment and plan as directed by Dr Leonie Man  Future follow up with NP.     Larey Seat, MD

## 2016-10-03 ENCOUNTER — Ambulatory Visit: Payer: 59 | Admitting: Neurology

## 2016-10-06 DIAGNOSIS — E291 Testicular hypofunction: Secondary | ICD-10-CM | POA: Diagnosis not present

## 2016-10-20 DIAGNOSIS — G4733 Obstructive sleep apnea (adult) (pediatric): Secondary | ICD-10-CM | POA: Diagnosis not present

## 2016-10-28 DIAGNOSIS — E291 Testicular hypofunction: Secondary | ICD-10-CM | POA: Diagnosis not present

## 2016-11-19 DIAGNOSIS — E291 Testicular hypofunction: Secondary | ICD-10-CM | POA: Diagnosis not present

## 2016-11-19 DIAGNOSIS — G4733 Obstructive sleep apnea (adult) (pediatric): Secondary | ICD-10-CM | POA: Diagnosis not present

## 2016-12-08 DIAGNOSIS — E291 Testicular hypofunction: Secondary | ICD-10-CM | POA: Diagnosis not present

## 2016-12-20 DIAGNOSIS — G4733 Obstructive sleep apnea (adult) (pediatric): Secondary | ICD-10-CM | POA: Diagnosis not present

## 2016-12-22 ENCOUNTER — Ambulatory Visit: Payer: Self-pay | Admitting: Neurology

## 2016-12-23 ENCOUNTER — Encounter: Payer: Self-pay | Admitting: Neurology

## 2016-12-29 DIAGNOSIS — E291 Testicular hypofunction: Secondary | ICD-10-CM | POA: Diagnosis not present

## 2017-01-14 DIAGNOSIS — R300 Dysuria: Secondary | ICD-10-CM | POA: Diagnosis not present

## 2017-01-14 DIAGNOSIS — E1142 Type 2 diabetes mellitus with diabetic polyneuropathy: Secondary | ICD-10-CM | POA: Diagnosis not present

## 2017-01-19 DIAGNOSIS — E291 Testicular hypofunction: Secondary | ICD-10-CM | POA: Diagnosis not present

## 2017-01-20 DIAGNOSIS — G4733 Obstructive sleep apnea (adult) (pediatric): Secondary | ICD-10-CM | POA: Diagnosis not present

## 2017-01-29 DIAGNOSIS — G4733 Obstructive sleep apnea (adult) (pediatric): Secondary | ICD-10-CM | POA: Diagnosis not present

## 2017-02-09 DIAGNOSIS — E291 Testicular hypofunction: Secondary | ICD-10-CM | POA: Diagnosis not present

## 2017-03-02 DIAGNOSIS — E291 Testicular hypofunction: Secondary | ICD-10-CM | POA: Diagnosis not present

## 2017-03-23 DIAGNOSIS — E291 Testicular hypofunction: Secondary | ICD-10-CM | POA: Diagnosis not present

## 2017-03-30 ENCOUNTER — Encounter: Payer: Self-pay | Admitting: Nurse Practitioner

## 2017-03-30 ENCOUNTER — Ambulatory Visit (INDEPENDENT_AMBULATORY_CARE_PROVIDER_SITE_OTHER): Payer: 59 | Admitting: Nurse Practitioner

## 2017-03-30 VITALS — BP 146/72 | HR 96 | Ht 68.0 in | Wt 202.2 lb

## 2017-03-30 DIAGNOSIS — G4733 Obstructive sleep apnea (adult) (pediatric): Secondary | ICD-10-CM

## 2017-03-30 DIAGNOSIS — I10 Essential (primary) hypertension: Secondary | ICD-10-CM | POA: Insufficient documentation

## 2017-03-30 DIAGNOSIS — E785 Hyperlipidemia, unspecified: Secondary | ICD-10-CM | POA: Diagnosis not present

## 2017-03-30 DIAGNOSIS — I639 Cerebral infarction, unspecified: Secondary | ICD-10-CM

## 2017-03-30 DIAGNOSIS — Z9989 Dependence on other enabling machines and devices: Secondary | ICD-10-CM | POA: Diagnosis not present

## 2017-03-30 DIAGNOSIS — I6381 Other cerebral infarction due to occlusion or stenosis of small artery: Secondary | ICD-10-CM

## 2017-03-30 MED ORDER — CLOPIDOGREL BISULFATE 75 MG PO TABS
75.0000 mg | ORAL_TABLET | Freq: Every day | ORAL | 1 refills | Status: DC
Start: 2017-03-30 — End: 2017-11-05

## 2017-03-30 NOTE — Patient Instructions (Signed)
Start Plavix for secondary stroke prevention LDL cholesterol below 70 , Continue Zocor for hyperlipidemia labs followed by primary care Diabetes with hemoglobin A1c below 6.5 continue diabetic medications Keep blood pressure systolic less than 335 today's reading 146/72 Diabetic diets plenty of whole grains fruits and vegetables Continue CPAP machine for obstructive sleep apnea  Discharge from stroke service

## 2017-03-30 NOTE — Progress Notes (Signed)
[facility]  PATIENT: Adam Quinn DOB: 19-Oct-1956   REASON FOR VISIT: Follow-up for history of stroke the diagnosis of obstructive sleep apnea with CPAP HISTORY FROM: Patient    HISTORY OF PRESENT ILLNESS:Initial Consult 06/26/2016 ;  Adam Quinn is a 39 year Caucasian male who developed sudden onset of left face and neck and arm pain and paresthesias on April 15 while lifting boxes when he was mowing his house. He describes a feeling of sensation of on the top of his head and face. this lasted for a couple of weeks and gradually improved. he did not seek immediate help but eventually size primary physician. tender mri scan cervical spine which i have reviewed which showed mild foraminal degenerative changes without significant compression. he was referred to dr. 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 the left vertebral artery flow void was absent in the right vertebral artery was dominant and from the basilar artery. patient has no significant vascular risk factors except diabetes which appears to be well controlled on last hemoglobin a1c a few months ago was 6.7. he has no history of hypertension. he has been started on statins years ago for more for primary prevention  As part of a studybut has no known history of cardiac disease or strokes. he does not smoke. he does have however family history is multiple family members on both sides having strokes. he is allergic to aspirin. patient does have history of snoring and restless sleep as well as he feels significantly tired upon awakening. he has not yet had a sleep study. Update 09/30/2016 ; Patient returns for follow-up after last 3 months ago. He states is doing well and has not had any recurrent  stroke or TIA symptoms. 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 Patient feels as a code fully advanced no deficits. Patient has not been taking Plavix as is concerned about possible interaction with omeprazole which is taking and was pointed out to him by his brother-in-law. Patient states his tried Pepcid before but did not work for him. I advised him to discuss with his primary physician  Patient is allergic to aspirin and cannot be switch to aspirin. He did undergo polysomnogram and was diagnosed with sleep apnea. He has been fitted with a CPAP mask but has not yet received that and plans to start it soon. He was seen by Dr. Brett Fairy for sleep apnea as well. 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 the left vertebral artery flow void was absent in the right vertebral artery was dominant and from the basilar artery. The left vertebral artery is small size likely from birth. The patient has not had further stroke or TIA symptoms The patient tells me today he has never started the Plavix because his wife who is an RN told him if he ever had surgery he would have to be off the medication for several days. Patient has an allergy to aspirin. Patient is diabetic  and claims his fasting sugars are between 120-170. Blood pressure in the office today 146/72 he claims he is compliant with his blood pressure medications. Patient was recently diagnosed with obstructive sleep apnea and is now on CPAP. He claims his fatigue is much less. He has not had a follow-up with his sleep doctor.  He returns for reevaluation  REVIEW OF SYSTEMS: Full 14 system review of systems performed and notable  only for those listed, all others are neg:  Constitutional: neg  Cardiovascular: neg Ear/Nose/Throat: Ringing in the ears Skin: neg Eyes: neg Respiratory: neg Gastroitestinal: neg  Hematology/Lymphatic: Anemia Endocrine: neg Musculoskeletal:neg Allergy/Immunology: neg Neurological: neg Psychiatric: neg Sleep : Obstructive sleep apnea with CPAP   ALLERGIES: Allergies  Allergen Reactions  . Aspirin Hives  . Shrimp [Shellfish Allergy] Hives    Just shrimp     HOME MEDICATIONS: Outpatient Medications Prior to Visit  Medication Sig Dispense Refill  . Coenzyme Q10 (COQ10 PO) Take 200 mg by mouth 2 (two) times daily.    . ferrous fumarate (HEMOCYTE - 106 MG FE) 325 (106 FE) MG TABS tablet Take 1 tablet by mouth daily.    Marland Kitchen gabapentin (NEURONTIN) 300 MG capsule Take 300 mg by mouth daily.    Marland Kitchen ibuprofen (ADVIL,MOTRIN) 800 MG tablet Take 800 mg by mouth every 8 (eight) hours as needed.    . Liraglutide (VICTOZA [place]) Inject 1.8 mLs into the skin daily.    Marland Kitchen lisinopril (PRINIVIL,ZESTRIL) 5 MG tablet Take 5 mg by mouth daily.    . Loratadine (ALAVERT PO) Take 1 tablet by mouth at bedtime. Alavert D    . MELATONIN PO Take 9 mg by mouth at bedtime.     . metFORMIN (GLUCOPHAGE-XR) 500 MG 24 hr tablet Take 500 mg by mouth daily with breakfast.    . omeprazole (PRILOSEC) 40 MG capsule Take 1 capsule (40 mg total) by mouth 2 (two) times daily. 60 capsule 3  . sertraline (ZOLOFT) 25 MG tablet Take 100 mg by mouth daily.     . simvastatin (ZOCOR) 20 MG tablet Take 20 mg by mouth every evening.    . sodium bicarbonate 650 MG tablet Take 1 tablet (650 mg total) by mouth 2 (two) times daily. 60 tablet 3  . TESTOSTERONE IM Inject into the muscle.    . Travoprost, BAK Free, (TRAVATAN Z) 0.004 % SOLN ophthalmic solution Place 1 drop into both eyes at bedtime.    . clopidogrel (PLAVIX) 75 MG tablet Take 1 tablet (75 mg total) by mouth daily. (Patient not taking: Reported on 09/30/2016) 30 tablet 11    . glimepiride (AMARYL) 4 MG tablet Take 4 mg by mouth 2 (two) times daily.     No facility-administered medications prior to visit.     PAST MEDICAL HISTORY: Past Medical History:  Diagnosis Date  . Allergy    seasonal  . Anemia   . Anxiety   . Cataract    cataract surgery bilaterally resolved issues  . Chronic kidney disease    small protien showing uses Lisinopril for this  . Diverticulitis   . Diverticulosis   . DM (diabetes mellitus) ([place])   . GERD (gastroesophageal reflux disease)   . Glaucoma   . History of kidney stones    x1  . Stroke [facility])     PAST SURGICAL HISTORY: Past Surgical History:  Procedure Laterality Date  . cataract surgery     bilateral  . COLONOSCOPY    . COLONOSCOPY N/A 06/04/2015  Procedure: COLONOSCOPY;  Surgeon: Inda Castle, MD;  Location: [facility] ENDOSCOPY;  Service: Endoscopy;  Laterality: N/A;  . ESOPHAGOGASTRODUODENOSCOPY N/A 06/04/2015   Procedure: ESOPHAGOGASTRODUODENOSCOPY (EGD);  Surgeon: Inda Castle, MD;  Location: [facility];  Service: Endoscopy;  Laterality: N/A;  . UPPER GASTROINTESTINAL ENDOSCOPY    . WISDOM TOOTH EXTRACTION     with sedation    FAMILY HISTORY: Family History  Problem Relation Age of Onset  . Colon polyps Father   . Lung cancer Father   . Diabetes Mother   . Dementia Mother   . Diabetes Sister     x 2  . Stroke Maternal Grandmother   . Stroke Paternal Grandfather   . Colon cancer Neg Hx   . Rectal cancer Neg Hx   . Stomach cancer Neg Hx     SOCIAL HISTORY: Social History   Social History  . Marital status: Married    Spouse name: N/A  . Number of children: 3  . Years of education: N/A   Occupational History  . consultant    Social History Main Topics  . Smoking status: Former Smoker    Quit date: 10/04/1997  . Smokeless tobacco: Never Used  . Alcohol use No  . Drug use: No  . Sexual activity: Not on file   Other Topics Concern  . Not on file   Social History Narrative  . No  narrative on file     PHYSICAL EXAM  Vitals:   03/30/17 1517  BP: (!) 146/72  Pulse: 96  Weight: 202 lb 3.2 oz (91.7 kg)  Height: 5\' 8"  (1.727 m)   Body mass index is 30.74 kg/m.  Generalized: Well developed, obese male in no acute distress  Head: normocephalic and atraumatic,. Oropharynx benign  Neck: Supple, no carotid bruits  Cardiac: Regular rate rhythm, no murmur  Musculoskeletal: No deformity   Neurological examination   Mentation: Alert oriented to time, place, history taking. Attention span and concentration appropriate. Recent and remote memory intact.  Follows all commands speech and language fluent.   Cranial nerve II-XII: Pupils were equal round reactive to light extraocular movements were full, visual field were full on confrontational test. Facial sensation and strength were normal. hearing was intact to finger rubbing bilaterally. Uvula tongue midline. head turning and shoulder shrug were normal and symmetric.Tongue protrusion into cheek strength was normal. Motor: normal bulk and tone, full strength in the BUE, BLE, Sensory: normal and symmetric to light touch, in the upper and lower extremities Coordination: finger-nose-finger, heel-to-shin bilaterally, no dysmetria Reflexes: 1+ upper lower and symmetric, plantar responses were flexor bilaterally. Gait and Station: Rising up from seated position without assistance, normal stance,  moderate stride, good arm swing, smooth turning, able to perform tiptoe, and heel walking without difficulty. Tandem gait is steady  DIAGNOSTIC DATA (LABS, IMAGING, TESTING) - I reviewed patient records, labs, notes, testing and imaging myself where available.      Component Value Date/Time   CREATININE 1.30 (H) 07/11/2016 0109      ASSESSMENT AND PLAN 47 year Caucasian male with episode of left face and arm pain and numbness likely secondary to a small brainstem infarct. MRI scan of the brain 6/2017shows multiple bilateral  subcortical lacunar infarcts. Etiology unclear as to small vessel disease or left vertebral  artery occlusion. New diagnosis of obstructive sleep apnea. He has a past medical history of  Chronic kidney disease;  And DM (diabetes mellitus) ([place]); patient has never started Plavix.   PLAN: Discussed  with Dr. Leonie Man who came in and talked to the patient about the importance of taking Plavix to prevent stroke. Patient is allergic to aspirin. Patient is concerned that he would have to be off the medication for a major surgical procedure however he has no surgical procedures planned and he would have to be off of aspirin or other anti-coagulants if surgery was planned . Start Plavix for secondary stroke prevention will refill for 6 months and then obtain from PCP Dr. Laurann Montana LDL cholesterol below 70 , Continue Zocor for hyperlipidemia labs followed by primary care Diabetes with hemoglobin A1c below 6.5 continue diabetic medications, hemoglobin A1c followed by primary care Keep blood pressure systolic less than 117 today's reading 146/72, continue blood pressure medications Diabetic diet, plenty of whole grains fruits and vegetables Continue CPAP machine for obstructive sleep apnea  Need to exercise by walking for overall health and well-being Discharge from stroke service, no further stroke or TIA symptoms since April 2017 when he developed sudden onset of left face and neck and arm pain and paresthesias  lifting boxes when he was moving his house.  I spent 30 min  in total face to face time with the patient more than 50% of which was spent counseling and coordination of care, reviewing test results reviewing medications and discussing and reviewing the diagnosis of stroke and treatment options and management of risk factors. Adam Quinn, [facility], [facility], APRN  [facility] [address] [place], [place] [address] [phone]

## 2017-03-31 NOTE — Progress Notes (Signed)
I agree with the above plan 

## 2017-04-06 DIAGNOSIS — N183 Chronic kidney disease, stage 3 (moderate): Secondary | ICD-10-CM | POA: Diagnosis not present

## 2017-04-06 DIAGNOSIS — Z0000 Encounter for general adult medical examination without abnormal findings: Secondary | ICD-10-CM | POA: Diagnosis not present

## 2017-04-06 DIAGNOSIS — F39 Unspecified mood [affective] disorder: Secondary | ICD-10-CM | POA: Diagnosis not present

## 2017-04-06 DIAGNOSIS — N5201 Erectile dysfunction due to arterial insufficiency: Secondary | ICD-10-CM | POA: Diagnosis not present

## 2017-04-06 DIAGNOSIS — E1121 Type 2 diabetes mellitus with diabetic nephropathy: Secondary | ICD-10-CM | POA: Diagnosis not present

## 2017-04-06 DIAGNOSIS — E785 Hyperlipidemia, unspecified: Secondary | ICD-10-CM | POA: Diagnosis not present

## 2017-04-06 DIAGNOSIS — D509 Iron deficiency anemia, unspecified: Secondary | ICD-10-CM | POA: Diagnosis not present

## 2017-04-06 DIAGNOSIS — Z125 Encounter for screening for malignant neoplasm of prostate: Secondary | ICD-10-CM | POA: Diagnosis not present

## 2017-04-06 DIAGNOSIS — E1142 Type 2 diabetes mellitus with diabetic polyneuropathy: Secondary | ICD-10-CM | POA: Diagnosis not present

## 2017-04-06 DIAGNOSIS — I129 Hypertensive chronic kidney disease with stage 1 through stage 4 chronic kidney disease, or unspecified chronic kidney disease: Secondary | ICD-10-CM | POA: Diagnosis not present

## 2017-04-06 DIAGNOSIS — E291 Testicular hypofunction: Secondary | ICD-10-CM | POA: Diagnosis not present

## 2017-04-06 DIAGNOSIS — I639 Cerebral infarction, unspecified: Secondary | ICD-10-CM | POA: Diagnosis not present

## 2017-04-13 DIAGNOSIS — E291 Testicular hypofunction: Secondary | ICD-10-CM | POA: Diagnosis not present

## 2017-04-13 DIAGNOSIS — D509 Iron deficiency anemia, unspecified: Secondary | ICD-10-CM | POA: Diagnosis not present

## 2017-04-21 ENCOUNTER — Encounter: Payer: Self-pay | Admitting: Neurology

## 2017-04-23 ENCOUNTER — Encounter: Payer: Self-pay | Admitting: Neurology

## 2017-04-23 ENCOUNTER — Ambulatory Visit: Payer: Self-pay | Admitting: Neurology

## 2017-04-23 VITALS — BP 122/65 | HR 106 | Resp 20 | Ht 68.0 in | Wt 202.0 lb

## 2017-04-23 DIAGNOSIS — G4752 REM sleep behavior disorder: Secondary | ICD-10-CM | POA: Diagnosis not present

## 2017-04-23 DIAGNOSIS — Z9989 Dependence on other enabling machines and devices: Secondary | ICD-10-CM | POA: Diagnosis not present

## 2017-04-23 DIAGNOSIS — G4733 Obstructive sleep apnea (adult) (pediatric): Secondary | ICD-10-CM

## 2017-04-23 DIAGNOSIS — D649 Anemia, unspecified: Secondary | ICD-10-CM | POA: Diagnosis not present

## 2017-04-23 NOTE — Progress Notes (Signed)
SLEEP MEDICINE CLINIC   Provider:  Larey Seat, M D  Referring Provider: Kelton Pillar, MD Primary Care Physician:  Kelton Pillar, MD  Chief Complaint  Patient presents with  . Follow-up    cpap    HPI:  Adam Quinn is a 61 y.o. male , seen here as a referral from Dr. Leonie Man,   Adam Quinn underwent a CPAP titration study following a baseline polysomnogram which revealed an AHI of 11.6 and an oxygen nadir of 75% desaturation. He was titrated to 7 cm water pressure. It was also recommended that he reduce his caffeine intake. He had reported REM BD- and was asked t use melatonin before any prescription medication w should be used. He takes 9 mg with good success.  Interval history the patient is here for his yearly revisit dedicated to CPAP compliance. He has used his machine 97% of the last 30 days with an average user time of 7 hours and 19 minutes, CPAP is still set at 7 cm water with an EPR of 3 cm water the residual AHI is 3.5 he does have occasional air leaks. 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   Adam Quinn is a 61 year old, right-handed, Caucasian married male who developed sudden onset of left face and neck as well as left arm pain with paresthesias Tesio has been 03/08/2016. He had just lifted some heavy boxes he was moving. He described a feeling of a sensation on the top of his head and face when from their descending. Interesting is that it lasted a couple of weeks therefore he did not seek immediate help. An MRI scan of the cervical spine has been performed by Dr. Laurann Montana, and there was no significant compression noted and next Dr. 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 Dr. Leonie Man evaluated the vascular risk factors in his previous note.   The patient does not smoke has nor has family history of cardiac disease but family history of multiple family members having strokes. He has an allergy to aspirin. Since he reported also a history of snoring and restless sleep and daytime fatigue Dr. Leonie Man had felt it  indicated to order a sleep study. Adam Quinn reports that in April 2017 he and his wife had a stress full time, caring for his in laws. Soon after they had to move their household. He works as an Passenger transport manager.  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   Sleep habits are as follows: The patient usually goes to bed and falls asleep promptly at about 11 PM, his bedroom is cool and quiet and dark. He shares a bed with his wife, who has noted him to snore loudly and she has also reported that he sits up in bed gasping for breath. He has none or one bathroom break at night this usually does not fragment his sleep. He sleeps usually through until 6 AM in the morning. He relies in an alarm to wake him for work. He sleeps on 2 pillows and he prefers to go to sleep on his right side. He spends a lot of time sleeping supine. Over the last couple of years he has more often acted out  upon a dream and he reports plenty of visit dreams. He has hit his wife accidentally he has rash or kicked in bed and he has woken himself sleep talking or yelling. He does not sleep walk. Sometimes he is woken up by his acid reflux condition. Sleep medical history and family sleep history:  No known family history of sleep disorder.  Social history:  Adam Quinn does not drink alcohol, he does not use tobacco products in any form, he drinks Diet Coke with caffeine 4-5 cans a day. He considered this an addiction. No coffee, but used to drink sweet coffee.  Adam Quinn has significant risk factors for obstructive sleep apnea including his body mass index, and his neck circumference. He  has a Mallampati grade 4 the tip of the uvula cannot lift above the tongue ground, his tonsils have atrophied he does have swelling of the soft palate and some reddening indicating that he does suffer from acid reflux. This would definitely explain his snoring. As to the degree that apnea may be present I will order a sleep study. Since he has suffered several silent strokes the sleep study will also give [place] evidence of paroxysmal atrial atrial fibrillation, nocturnal blood pressure spikes or heart rate irregularities. Hypoxemia will be evaluated as well he does not have sleep related headaches and therefore I will not need Mammography to be part of the study. He has reported REM behavior and I will ask the technologist to place this patient in a bedroom with excellent video and audio to see if his REM stages are associated with muscle tone abnormalities He is invited to take 5 or 6 mg of melatonin each night to suppress REM behavior disorder until he has a sleep study.     Dr.  Clydene Fake ASSESSMENT: 79 year Caucasian male with episode of left face and arm pain and numbness likely secondary to a small brainstem infarct. MRI scan of the brain shows multiple bilateral subcortical lacunar infarcts. Etiology unclear as to small vessel disease or left vertebral  artery occlusion  PLAN: I had a long d/w patient and wife about his recent stroke, risk for recurrent stroke/TIAs, personally independently reviewed imaging studies and stroke evaluation results and answered questions.check CT angiogram of the neck to evaluate left vertebral artery occlusion versus congenital hypolasia and polysomnogram for sleep apnea. Start Plavix 75 mg daily  for secondary stroke prevention and maintain strict control of hypertension with blood pressure goal below 130/90, diabetes with hemoglobin A1c goal below 6.5% and lipids with LDL cholesterol goal below 70 mg/dL. I also advised the patient to eat a healthy diet with plenty of  whole grains, cereals, fruits and vegetables, exercise regularly and maintain ideal body weight .greater than 50% time during this 45 and a consultation visit was spent on counseling and coordination of care about stroke and TIA risk, prevention and treatment Followup in the future with me in 3 months or call earlier if necessary.  Antony Contras, MD  Review of Systems: Out of a complete 14 system review, the patient complains of only the following symptoms, and all other reviewed systems are negative. Sleep talking, yelling, thrashing and kicking, REM BD, no RLS, cramping in legs.   Epworth score 8-9  , Fatigue severity score 50  , depression score n/a   Social History   Social History  . Marital status: Married    Spouse name: N/A  . Number of children: 3  . Years of education: N/A   Occupational  History  . consultant    Social History Main Topics  . Smoking status: Former Smoker    Quit date: 10/04/1997  . Smokeless tobacco: Never Used  . Alcohol use No  . Drug use: No  . Sexual activity: Not on file   Other Topics Concern  . Not on file   Social History Narrative  . No narrative on file    Family History  Problem Relation Age of Onset  . Colon polyps Father   . Lung cancer Father   . Diabetes Mother   . Dementia Mother   . Diabetes Sister        x 2  . Stroke Maternal Grandmother   . Stroke Paternal Grandfather   . Colon cancer Neg Hx   . Rectal cancer Neg Hx   . Stomach cancer Neg Hx     Past Medical History:  Diagnosis Date  . Allergy    seasonal  . Anemia   . Anxiety   . Cataract    cataract surgery bilaterally resolved issues  . Chronic kidney disease    small protien showing uses Lisinopril for this  . Diverticulitis   . Diverticulosis   . DM (diabetes mellitus) ([place])   . GERD (gastroesophageal reflux disease)   . Glaucoma   . History of kidney stones    x1  . Stroke [facility])     Past Surgical History:  Procedure Laterality Date  . cataract  surgery     bilateral  . COLONOSCOPY    . COLONOSCOPY N/A 06/04/2015   Procedure: COLONOSCOPY;  Surgeon: Inda Castle, MD;  Location: [facility] ENDOSCOPY;  Service: Endoscopy;  Laterality: N/A;  . ESOPHAGOGASTRODUODENOSCOPY N/A 06/04/2015   Procedure: ESOPHAGOGASTRODUODENOSCOPY (EGD);  Surgeon: Inda Castle, MD;  Location: [facility];  Service: Endoscopy;  Laterality: N/A;  . UPPER GASTROINTESTINAL ENDOSCOPY    . WISDOM TOOTH EXTRACTION     with sedation    Current Outpatient Prescriptions  Medication Sig Dispense Refill  . clopidogrel (PLAVIX) 75 MG tablet Take 1 tablet (75 mg total) by mouth daily. 90 tablet 1  . Coenzyme Q10 (COQ10 PO) Take 200 mg by mouth 2 (two) times daily.    . ferrous fumarate (HEMOCYTE - 106 MG FE) 325 (106 FE) MG TABS tablet Take 1 tablet by mouth daily.    Marland Kitchen gabapentin (NEURONTIN) 300 MG capsule Take 300 mg by mouth daily.    Marland Kitchen ibuprofen (ADVIL,MOTRIN) 800 MG tablet Take 800 mg by mouth every 8 (eight) hours as needed.    . Liraglutide (VICTOZA Carter) Inject 1.8 mLs into the skin daily.    Marland Kitchen lisinopril (PRINIVIL,ZESTRIL) 5 MG tablet Take 5 mg by mouth daily.    . Loratadine (ALAVERT PO) Take 1 tablet by mouth at bedtime. Alavert D    . MELATONIN PO Take 9 mg by mouth at bedtime.     . metFORMIN (GLUCOPHAGE-XR) 500 MG 24 hr tablet Take 500 mg by mouth daily with breakfast.    . omeprazole (PRILOSEC) 40 MG capsule Take 1 capsule (40 mg total) by mouth 2 (two) times daily. 60 capsule 3  . sertraline (ZOLOFT) 25 MG tablet Take 100 mg by mouth daily.     . simvastatin (ZOCOR) 20 MG tablet Take 20 mg by mouth every evening.    . sodium bicarbonate 650 MG tablet Take 1 tablet (650 mg total) by mouth 2 (two) times daily. 60 tablet 3  . TESTOSTERONE IM Inject into the muscle.    Marland Kitchen  TOUJEO SOLOSTAR 300 UNIT/ML SOPN Inject 40 Units into the skin at bedtime.   1  . Travoprost, BAK Free, (TRAVATAN Z) 0.004 % SOLN ophthalmic solution Place 1 drop into both eyes at bedtime.      No current facility-administered medications for this visit.     Allergies as of 04/23/2017 - Review Complete 04/23/2017  Allergen Reaction Noted  . Aspirin Hives 03/16/2008  . Shrimp [shellfish allergy] Hives 05/21/2015    Vitals: BP 122/65   Pulse (!) 106   Resp 20   Ht 5\' 8"  (1.727 m)   Wt 202 lb (91.6 kg)   BMI 30.71 kg/m  Last Weight:  Wt Readings from Last 1 Encounters:  04/23/17 202 lb (91.6 kg)   DVV:OHYW mass index is 30.71 kg/m.     Last Height:   Ht Readings from Last 1 Encounters:  04/23/17 5\' 8"  (1.727 m)    Physical exam:  General: The patient is awake, alert and appears not in acute distress. The patient is well groomed.he looks very pale, and his tongue is pale.  Head: Normocephalic, atraumatic. Neck is supple. Mallampati 4,  neck circumference: 17.5 short and thick. Nasal airflow patent today, Retrognathia is seen.  Double chin.  Cardiovascular:  Regular rate and rhythm , without  murmurs or carotid bruit, and without distended neck veins. Respiratory: Lungs are clear to auscultation. Skin:  Without evidence of edema- Trunk: BMI is elevated , abdominal and truncal girth. The patient's posture is erect  Neurologic exam : The patient is awake and alert, oriented to place and time.   Memory subjective described as intact.  Attention span & concentration ability appears normal.  Speech is fluent,  without  dysarthria, but with dysphonia or aphasia.  Mood and affect are appropriate.  Cranial nerves: Pupils are equal and briskly reactive to light. Visual fields by finger perimetry are intact.Hearing to finger rub intact. Facial sensation intact to fine touch.Facial motor strength is symmetric and tongue and uvula move midline.    The patient was advised of the nature of the diagnosed sleep disorder , the treatment options and risks for general a health and wellness arising from not treating the condition.  I spent more than 15 minutes of face to face  time with the patient. Greater than 50% of time was spent in counseling and coordination of care. We have discussed the diagnosis and differential and I answered the patient's questions.     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 By mouth at bedtime.  2 mild obstructive sleep apnea is very well controlled under the current CPAP setting of only 7 cm water pressure with an EPR of 3. The residual AHI is 3.5 insufficient. Compliance is 97%  3) the patient has recovered well from his pontine stroke, he is now released from the stroke clinic under Dr. Leonie Man guidance  4). Anemia. Adam Quinn has struggled with anemia for several years he's taking oral iron supplements but his last hemoglobin and hematocrit was still low. Dr. Leonie Man recomended a hematologist referral. HIs wife (RN)  sees a doctor in [facility].    Plan:  Treatment plan and additional workup :  I like for the patient to continue melatonin, his oral iron and vitamin B complex, it would probably not hurt to take a vitamin D. He should continue using his CPAP as compliant as he is now. I would like indeed for him to see a hematologist to be worked up for anemia.      Asencion Partridge Ernestine Rohman MD  04/23/2017   CC:    CC:

## 2017-05-04 DIAGNOSIS — E291 Testicular hypofunction: Secondary | ICD-10-CM | POA: Diagnosis not present

## 2017-05-25 DIAGNOSIS — E291 Testicular hypofunction: Secondary | ICD-10-CM | POA: Diagnosis not present

## 2017-05-26 ENCOUNTER — Ambulatory Visit: Payer: 59

## 2017-05-26 ENCOUNTER — Other Ambulatory Visit (HOSPITAL_BASED_OUTPATIENT_CLINIC_OR_DEPARTMENT_OTHER): Payer: 59

## 2017-05-26 ENCOUNTER — Ambulatory Visit (HOSPITAL_BASED_OUTPATIENT_CLINIC_OR_DEPARTMENT_OTHER): Payer: 59 | Admitting: Family

## 2017-05-26 ENCOUNTER — Other Ambulatory Visit: Payer: Self-pay | Admitting: Family

## 2017-05-26 VITALS — BP 142/66 | HR 99 | Temp 98.2°F | Resp 18 | Ht 68.0 in | Wt 200.0 lb

## 2017-05-26 DIAGNOSIS — D649 Anemia, unspecified: Secondary | ICD-10-CM | POA: Diagnosis not present

## 2017-05-26 DIAGNOSIS — D509 Iron deficiency anemia, unspecified: Secondary | ICD-10-CM | POA: Diagnosis not present

## 2017-05-26 DIAGNOSIS — E291 Testicular hypofunction: Secondary | ICD-10-CM

## 2017-05-26 DIAGNOSIS — K909 Intestinal malabsorption, unspecified: Secondary | ICD-10-CM | POA: Insufficient documentation

## 2017-05-26 DIAGNOSIS — Z8673 Personal history of transient ischemic attack (TIA), and cerebral infarction without residual deficits: Secondary | ICD-10-CM

## 2017-05-26 DIAGNOSIS — K648 Other hemorrhoids: Secondary | ICD-10-CM

## 2017-05-26 DIAGNOSIS — E119 Type 2 diabetes mellitus without complications: Secondary | ICD-10-CM

## 2017-05-26 DIAGNOSIS — D500 Iron deficiency anemia secondary to blood loss (chronic): Secondary | ICD-10-CM | POA: Insufficient documentation

## 2017-05-26 LAB — COMPREHENSIVE METABOLIC PANEL
ALT: 22 U/L (ref 0–55)
AST: 16 U/L (ref 5–34)
Albumin: 3.7 g/dL (ref 3.5–5.0)
Alkaline Phosphatase: 83 U/L (ref 40–150)
Anion Gap: 11 mEq/L (ref 3–11)
BUN: 12.7 mg/dL (ref 7.0–26.0)
CALCIUM: 9.5 mg/dL (ref 8.4–10.4)
CHLORIDE: 104 meq/L (ref 98–109)
CO2: 25 meq/L (ref 22–29)
Creatinine: 1.2 mg/dL (ref 0.7–1.3)
EGFR: 62 mL/min/{1.73_m2} — ABNORMAL LOW (ref >90)
Glucose: 175 mg/dl — ABNORMAL HIGH (ref 70–140)
POTASSIUM: 4.2 meq/L (ref 3.5–5.1)
SODIUM: 140 meq/L (ref 136–145)
Total Bilirubin: 0.28 mg/dL (ref 0.20–1.20)
Total Protein: 6.9 g/dL (ref 6.4–8.3)

## 2017-05-26 LAB — CHCC SATELLITE - SMEAR

## 2017-05-26 LAB — CBC WITH DIFFERENTIAL (CANCER CENTER ONLY)
BASO#: 0.1 10*3/uL (ref 0.0–0.2)
BASO%: 1.1 % (ref 0.0–2.0)
EOS ABS: 0.1 10*3/uL (ref 0.0–0.5)
EOS%: 1.8 % (ref 0.0–7.0)
HCT: 32.7 % — ABNORMAL LOW (ref 38.7–49.9)
HGB: 9.1 g/dL — ABNORMAL LOW (ref 13.0–17.1)
LYMPH#: 1.2 10*3/uL (ref 0.9–3.3)
LYMPH%: 21.8 % (ref 14.0–48.0)
MCH: 18.8 pg — ABNORMAL LOW (ref 28.0–33.4)
MCHC: 27.8 g/dL — AB (ref 32.0–35.9)
MCV: 68 fL — ABNORMAL LOW (ref 82–98)
MONO#: 0.5 10*3/uL (ref 0.1–0.9)
MONO%: 8.5 % (ref 0.0–13.0)
NEUT%: 66.8 % (ref 40.0–80.0)
NEUTROS ABS: 3.7 10*3/uL (ref 1.5–6.5)
PLATELETS: 301 10*3/uL (ref 145–400)
RBC: 4.84 10*6/uL (ref 4.20–5.70)
RDW: 18.8 % — AB (ref 11.1–15.7)
WBC: 5.5 10*3/uL (ref 4.0–10.0)

## 2017-05-26 LAB — IRON AND TIBC
%SAT: 3 % — AB (ref 20–55)
Iron: 16 ug/dL — ABNORMAL LOW (ref 42–163)
TIBC: 470 ug/dL — AB (ref 202–409)
UIBC: 454 ug/dL — AB (ref 117–376)

## 2017-05-26 LAB — LACTATE DEHYDROGENASE: LDH: 156 U/L (ref 125–245)

## 2017-05-26 LAB — FERRITIN: Ferritin: 6 ng/ml — ABNORMAL LOW (ref 22–316)

## 2017-05-26 NOTE — Progress Notes (Signed)
Hematology/Oncology Consultation   Name: Adam Quinn      MRN: [id number]    Location: Room/bed info not found  Date: 05/26/2017 Time:10:13 AM   REFERRING PHYSICIAN: Larey Seat, MD  REASON FOR CONSULT: Anemia   DIAGNOSIS:  Iron deficiency anemia Erythropoietin deficiency anemia  Of Note: Patient has history of ischemic stroke   HISTORY OF PRESENT ILLNESS: Adam Quinn is a very pleasant 61 yo caucasian gentleman with history of anemia. He is symptomatic with fatigue and mild SOB.  He has tried taking oral iron BID for the last 3-4 years but has had no improvement. Hgb today is 9.1 with an MCV of 68.  He has type II diabetes and is on Victoza, Toujeo and Metformin daily. His last Hgb A1c was 8.5.  He has low testosterone and has been receiving injections every 3 weeks fort he last year or so.  He has history of an ischemic stroke in April 2017. He is currently on Plavix. He denies having had any episodes of bleeding but has noticed that he does bruise easily.  His last endoscopy/colonoscopy were in 2016. He had 1 polyp removed from the cecum and had also had internal hemorrhoids.  No fever, chills, n/v, cough, rash, dizziness, chest pain, palpitations, abdominal pain or changes in bowel or bladder habits. He stool is dark and he has had constipation with the oral iron supplement.  He has sleep apnea and uses a CPAP at night.  No swelling or tenderness in his extremities at this time. The neuropathy in his feet is unchanged.  He is on simvastatin and has occasional aches and pains and leg/foot cramping. He has started taking COQ10 which seems to have helped.  He has maintained a good appetite and is staying well hydrated. His weight is stable.  He was previously a smoker, 1 ppd, but quit in 1997. No ETOH. He stays busy helping with his family and working as an Passenger transport manager.   ROS: All other 10 point review of systems is negative.   PAST MEDICAL HISTORY:   Past Medical History:   Diagnosis Date  . Allergy    seasonal  . Anemia   . Anxiety   . Cataract    cataract surgery bilaterally resolved issues  . Chronic kidney disease    small protien showing uses Lisinopril for this  . Diverticulitis   . Diverticulosis   . DM (diabetes mellitus) ([place])   . GERD (gastroesophageal reflux disease)   . Glaucoma   . History of kidney stones    x1  . Stroke [facility])     ALLERGIES: Allergies  Allergen Reactions  . Aspirin Hives  . Shrimp [Shellfish Allergy] Hives    Just shrimp       MEDICATIONS:  Current Outpatient Prescriptions on File Prior to Visit  Medication Sig Dispense Refill  . clopidogrel (PLAVIX) 75 MG tablet Take 1 tablet (75 mg total) by mouth daily. 90 tablet 1  . Coenzyme Q10 (COQ10 PO) Take 200 mg by mouth 2 (two) times daily.    . ferrous fumarate (HEMOCYTE - 106 MG FE) 325 (106 FE) MG TABS tablet Take 2 tablets by mouth daily.     Marland Kitchen gabapentin (NEURONTIN) 300 MG capsule Take 300 mg by mouth daily.    Marland Kitchen ibuprofen (ADVIL,MOTRIN) 800 MG tablet Take 800 mg by mouth every 8 (eight) hours as needed.    . Liraglutide (VICTOZA [place]) Inject 1.8 mLs into the skin daily.    Marland Kitchen lisinopril (PRINIVIL,ZESTRIL)  5 MG tablet Take 5 mg by mouth daily.    . Loratadine (ALAVERT PO) Take 1 tablet by mouth at bedtime. Alavert D    . MELATONIN PO Take 9 mg by mouth at bedtime.     . metFORMIN (GLUCOPHAGE-XR) 500 MG 24 hr tablet Take 2,000 mg by mouth at bedtime. Take four tablets at bedtime.    Marland Kitchen omeprazole (PRILOSEC) 40 MG capsule Take 1 capsule (40 mg total) by mouth 2 (two) times daily. 60 capsule 3  . sertraline (ZOLOFT) 25 MG tablet Take 100 mg by mouth daily.     . simvastatin (ZOCOR) 20 MG tablet Take 20 mg by mouth every evening.    . sodium bicarbonate 650 MG tablet Take 1 tablet (650 mg total) by mouth 2 (two) times daily. 60 tablet 3  . TESTOSTERONE IM Inject into the muscle.    Nelva Nay SOLOSTAR 300 UNIT/ML SOPN Inject 42 Units into the skin at bedtime.   1  .  Travoprost, BAK Free, (TRAVATAN Z) 0.004 % SOLN ophthalmic solution Place 1 drop into both eyes at bedtime.     No current facility-administered medications on file prior to visit.      PAST SURGICAL HISTORY Past Surgical History:  Procedure Laterality Date  . cataract surgery     bilateral  . COLONOSCOPY    . COLONOSCOPY N/A 06/04/2015   Procedure: COLONOSCOPY;  Surgeon: Inda Castle, MD;  Location: [facility];  Service: Endoscopy;  Laterality: N/A;  . ESOPHAGOGASTRODUODENOSCOPY N/A 06/04/2015   Procedure: ESOPHAGOGASTRODUODENOSCOPY (EGD);  Surgeon: Inda Castle, MD;  Location: [facility];  Service: Endoscopy;  Laterality: N/A;  . UPPER GASTROINTESTINAL ENDOSCOPY    . WISDOM TOOTH EXTRACTION     with sedation    FAMILY HISTORY: Family History  Problem Relation Age of Onset  . Colon polyps Father   . Lung cancer Father   . Diabetes Mother   . Dementia Mother   . Diabetes Sister        x 2  . Stroke Maternal Grandmother   . Stroke Paternal Grandfather   . Colon cancer Neg Hx   . Rectal cancer Neg Hx   . Stomach cancer Neg Hx     SOCIAL HISTORY:  reports that he quit smoking about 19 years ago. He has never used smokeless tobacco. He reports that he does not drink alcohol or use drugs.  PERFORMANCE STATUS: The patient's performance status is 1 - Symptomatic but completely ambulatory  PHYSICAL EXAM: Most Recent Vital Signs: Blood pressure (!) 142/66, pulse 99, temperature 98.2 F (36.8 C), temperature source Oral, resp. rate 18, height 5\' 8"  (1.727 m), weight 200 lb (90.7 kg), SpO2 98 %. BP (!) 142/66 (BP Location: Left Arm, Patient Position: Sitting)   Pulse 99   Temp 98.2 F (36.8 C) (Oral)   Resp 18   Ht 5\' 8"  (1.727 m)   Wt 200 lb (90.7 kg)   SpO2 98%   BMI 30.41 kg/m   General Appearance:    Alert, cooperative, no distress, appears stated age  Head:    Normocephalic, without obvious abnormality, atraumatic  Eyes:    PERRL, conjunctiva/corneas clear,  EOM's intact, fundi    benign, both eyes             Throat:   Lips, mucosa, and tongue normal; teeth and gums normal  Neck:   Supple, symmetrical, trachea midline, no adenopathy;       thyroid:  No enlargement/tenderness/nodules; no  carotid   bruit or JVD  Back:     Symmetric, no curvature, ROM normal, no CVA tenderness  Lungs:     Clear to auscultation bilaterally, respirations unlabored  Chest wall:    No tenderness or deformity  Heart:    Regular rate and rhythm, S1 and S2 normal, no murmur, rub   or gallop  Abdomen:     Soft, non-tender, bowel sounds active all four quadrants,    no masses, no organomegaly        Extremities:   Extremities normal, atraumatic, no cyanosis or edema  Pulses:   2+ and symmetric all extremities  Skin:   Skin color, texture, turgor normal, no rashes or lesions  Lymph nodes:   Cervical, supraclavicular, and axillary nodes normal  Neurologic:   CNII-XII intact. Normal strength, sensation and reflexes      throughout    LABORATORY DATA:  Results for orders placed or performed in visit on 05/26/17 (from the past 48 hour(s))  CBC w/Diff     Status: Abnormal   Collection Time: 05/26/17  8:56 AM  Result Value Ref Range   WBC 5.5 4.0 - 10.0 10e3/uL   RBC 4.84 4.20 - 5.70 10e6/uL   HGB 9.1 (L) 13.0 - 17.1 g/dL   HCT 32.7 (L) 38.7 - 49.9 %   MCV 68 (L) 82 - 98 fL   MCH 18.8 (L) 28.0 - 33.4 pg   MCHC 27.8 (L) 32.0 - 35.9 g/dL   RDW 18.8 (H) 11.1 - 15.7 %   Platelets 301 145 - 400 10e3/uL   NEUT# 3.7 1.5 - 6.5 10e3/uL   LYMPH# 1.2 0.9 - 3.3 10e3/uL   MONO# 0.5 0.1 - 0.9 10e3/uL   Eosinophils Absolute 0.1 0.0 - 0.5 10e3/uL   BASO# 0.1 0.0 - 0.2 10e3/uL   NEUT% 66.8 40.0 - 80.0 %   LYMPH% 21.8 14.0 - 48.0 %   MONO% 8.5 0.0 - 13.0 %   EOS% 1.8 0.0 - 7.0 %   BASO% 1.1 0.0 - 2.0 %  Smear     Status: None   Collection Time: 05/26/17  8:56 AM  Result Value Ref Range   Smear Result Smear Available       RADIOGRAPHY: No results found.      PATHOLOGY: None   ASSESSMENT/PLAN: Mr. Iglesia is a very pleasant 61 yo caucasian gentleman with history of anemia. He is symptomatic with fatigue and mild SOB. Hgb is 9.1 with an MCV of 68.  We will see what his lab work shows and bring him back in later this week for IV iron if needed.  With his history of ischemic stroke, now on plavix, we may need to forgo the ESA.  We discussed the multiple factors including malabsorption of iron and diabetes effect on the kidneys and erythropoietin production in detail. Once we have his lab work we will schedule a follow-up and repeat lab work.    All questions were answered. He will contact our office with any questions or concerns. We can certainly see him much sooner if necessary.  He was discussed with and also seen by Dr. Marin Olp and he is in agreement with the aforementioned.   [facility] M     Addendum:  I saw and examined patient with Adam Quinn. I agree with her assessment.  I'm sure that he has iron deficiency. He has had a very thorough GI evaluation. I suspect he may not have absorption of iron.  I also suspect  that he may have erythropoietin efficiency. He has diabetes. 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  Unfortunately, we probably cannot use ESA on him because of past history of strokes.  We will see what his iron studies show.  We spent about 40 minutes with him. We answered all of his questions. We went over his lab work.  Lattie Haw, MD

## 2017-05-27 LAB — RETICULOCYTES: Reticulocyte Count: 1.4 % (ref 0.6–2.6)

## 2017-05-27 LAB — VITAMIN B12: VITAMIN B 12: 290 pg/mL (ref 232–1245)

## 2017-05-27 LAB — ERYTHROPOIETIN: Erythropoietin: 87.1 m[IU]/mL — ABNORMAL HIGH (ref 2.6–18.5)

## 2017-05-28 ENCOUNTER — Other Ambulatory Visit: Payer: Self-pay | Admitting: Family

## 2017-05-28 ENCOUNTER — Ambulatory Visit (HOSPITAL_BASED_OUTPATIENT_CLINIC_OR_DEPARTMENT_OTHER): Payer: 59

## 2017-05-28 VITALS — BP 129/61 | HR 94 | Temp 98.0°F | Resp 16

## 2017-05-28 DIAGNOSIS — D500 Iron deficiency anemia secondary to blood loss (chronic): Secondary | ICD-10-CM

## 2017-05-28 DIAGNOSIS — D509 Iron deficiency anemia, unspecified: Secondary | ICD-10-CM

## 2017-05-28 MED ORDER — SODIUM CHLORIDE 0.9 % IV SOLN
Freq: Once | INTRAVENOUS | Status: AC
  Administered 2017-05-28: 15:00:00 via INTRAVENOUS

## 2017-05-28 MED ORDER — SODIUM CHLORIDE 0.9 % IV SOLN
510.0000 mg | Freq: Once | INTRAVENOUS | Status: AC
  Administered 2017-05-28: 510 mg via INTRAVENOUS
  Filled 2017-05-28: qty 17

## 2017-05-28 NOTE — Patient Instructions (Signed)

## 2017-06-08 ENCOUNTER — Ambulatory Visit (HOSPITAL_BASED_OUTPATIENT_CLINIC_OR_DEPARTMENT_OTHER): Payer: 59

## 2017-06-08 VITALS — BP 131/72 | HR 82 | Temp 97.9°F | Resp 20

## 2017-06-08 DIAGNOSIS — D500 Iron deficiency anemia secondary to blood loss (chronic): Secondary | ICD-10-CM

## 2017-06-08 DIAGNOSIS — D509 Iron deficiency anemia, unspecified: Secondary | ICD-10-CM | POA: Diagnosis not present

## 2017-06-08 MED ORDER — SODIUM CHLORIDE 0.9 % IV SOLN
Freq: Once | INTRAVENOUS | Status: AC
  Administered 2017-06-08: 14:00:00 via INTRAVENOUS

## 2017-06-08 MED ORDER — SODIUM CHLORIDE 0.9 % IV SOLN
510.0000 mg | Freq: Once | INTRAVENOUS | Status: AC
  Administered 2017-06-08: 510 mg via INTRAVENOUS
  Filled 2017-06-08: qty 17

## 2017-06-08 NOTE — Patient Instructions (Signed)

## 2017-06-09 DIAGNOSIS — G4733 Obstructive sleep apnea (adult) (pediatric): Secondary | ICD-10-CM | POA: Diagnosis not present

## 2017-06-15 DIAGNOSIS — E291 Testicular hypofunction: Secondary | ICD-10-CM | POA: Diagnosis not present

## 2017-06-22 DIAGNOSIS — E113293 Type 2 diabetes mellitus with mild nonproliferative diabetic retinopathy without macular edema, bilateral: Secondary | ICD-10-CM | POA: Diagnosis not present

## 2017-06-22 DIAGNOSIS — H401132 Primary open-angle glaucoma, bilateral, moderate stage: Secondary | ICD-10-CM | POA: Diagnosis not present

## 2017-06-22 DIAGNOSIS — H35033 Hypertensive retinopathy, bilateral: Secondary | ICD-10-CM | POA: Diagnosis not present

## 2017-06-22 DIAGNOSIS — Z961 Presence of intraocular lens: Secondary | ICD-10-CM | POA: Diagnosis not present

## 2017-07-03 ENCOUNTER — Other Ambulatory Visit: Payer: Self-pay | Admitting: *Deleted

## 2017-07-06 ENCOUNTER — Ambulatory Visit (HOSPITAL_BASED_OUTPATIENT_CLINIC_OR_DEPARTMENT_OTHER): Payer: 59 | Admitting: Family

## 2017-07-06 ENCOUNTER — Other Ambulatory Visit: Payer: Self-pay | Admitting: Family

## 2017-07-06 ENCOUNTER — Other Ambulatory Visit (HOSPITAL_BASED_OUTPATIENT_CLINIC_OR_DEPARTMENT_OTHER): Payer: 59

## 2017-07-06 VITALS — BP 153/73 | HR 81 | Temp 98.5°F | Resp 18 | Wt 202.0 lb

## 2017-07-06 DIAGNOSIS — D509 Iron deficiency anemia, unspecified: Secondary | ICD-10-CM

## 2017-07-06 DIAGNOSIS — K909 Intestinal malabsorption, unspecified: Secondary | ICD-10-CM | POA: Diagnosis not present

## 2017-07-06 DIAGNOSIS — E291 Testicular hypofunction: Secondary | ICD-10-CM | POA: Diagnosis not present

## 2017-07-06 DIAGNOSIS — D500 Iron deficiency anemia secondary to blood loss (chronic): Secondary | ICD-10-CM

## 2017-07-06 LAB — CBC WITH DIFFERENTIAL (CANCER CENTER ONLY)
BASO#: 0 10*3/uL (ref 0.0–0.2)
BASO%: 0.7 % (ref 0.0–2.0)
EOS ABS: 0.1 10*3/uL (ref 0.0–0.5)
EOS%: 1.5 % (ref 0.0–7.0)
HCT: 42.4 % (ref 38.7–49.9)
HEMOGLOBIN: 13.2 g/dL (ref 13.0–17.1)
LYMPH#: 1.1 10*3/uL (ref 0.9–3.3)
LYMPH%: 18.3 % (ref 14.0–48.0)
MCH: 24.7 pg — AB (ref 28.0–33.4)
MCHC: 31.1 g/dL — AB (ref 32.0–35.9)
MONO#: 0.6 10*3/uL (ref 0.1–0.9)
MONO%: 9.4 % (ref 0.0–13.0)
NEUT%: 70.1 % (ref 40.0–80.0)
NEUTROS ABS: 4.2 10*3/uL (ref 1.5–6.5)
Platelets: 218 10*3/uL (ref 145–400)
RBC: 5.34 10*6/uL (ref 4.20–5.70)
WBC: 6 10*3/uL (ref 4.0–10.0)

## 2017-07-06 LAB — TECHNOLOGIST REVIEW CHCC SATELLITE

## 2017-07-06 NOTE — Progress Notes (Signed)
Hematology and Oncology Follow Up Visit  JAICOB DIA [id number] [birth year] 61 y.o. 07/06/2017   Principle Diagnosis:  Iron deficiency anemia Erythropoietin deficiency anemia  Current Therapy:   IV iron as indicated    Interim History:  Mr. Scheck is is here today for follow-up. He received 2 doses of IV iron last month and has responded nicely. He states that he is feeling much better but still has occasional fatigue and chills that come and go.  He has had no episodes of bleeding, no bruising or petechiae.  No fever, n/v, dizziness, SOB, chest pain, palpitations, abdominal pain or changes in bowel or bladder habits.  No lymphadenopathy found on exam.  He is using his CPAP at night.  No swelling, tenderness, numbness or tingling in his extremities. No c/o pain at this time.  He has maintained a good appetite and is staying well hydrated. His weight is stable.  He states that his blood sugars have been better controlled.   ECOG Performance Status: 1 - Symptomatic but completely ambulatory  Medications:  Allergies as of 07/06/2017      Reactions   Aspirin Hives   Shrimp [shellfish Allergy] Hives   Just shrimp      Medication List       Accurate as of 07/06/17  1:21 PM. Always use your most recent med list.          ALAVERT PO Take 1 tablet by mouth at bedtime. Alavert D   clopidogrel 75 MG tablet Commonly known as:  PLAVIX Take 1 tablet (75 mg total) by mouth daily.   COQ10 PO Take 200 mg by mouth 2 (two) times daily.   ferrous fumarate 325 (106 Fe) MG Tabs tablet Commonly known as:  HEMOCYTE - 106 mg FE Take 2 tablets by mouth daily.   gabapentin 300 MG capsule Commonly known as:  NEURONTIN Take 300 mg by mouth daily.   ibuprofen 800 MG tablet Commonly known as:  ADVIL,MOTRIN Take 800 mg by mouth every 8 (eight) hours as needed.   lisinopril 5 MG tablet Commonly known as:  PRINIVIL,ZESTRIL Take 5 mg by mouth daily.   MELATONIN PO Take 9 mg by  mouth at bedtime.   metFORMIN 500 MG 24 hr tablet Commonly known as:  GLUCOPHAGE-XR Take 2,000 mg by mouth at bedtime. Take four tablets at bedtime.   omeprazole 40 MG capsule Commonly known as:  PRILOSEC Take 1 capsule (40 mg total) by mouth 2 (two) times daily.   sertraline 25 MG tablet Commonly known as:  ZOLOFT Take 100 mg by mouth daily.   simvastatin 20 MG tablet Commonly known as:  ZOCOR Take 20 mg by mouth every evening.   sodium bicarbonate 650 MG tablet Take 1 tablet (650 mg total) by mouth 2 (two) times daily.   TESTOSTERONE IM Inject into the muscle.   TOUJEO SOLOSTAR 300 UNIT/ML Sopn Generic drug:  Insulin Glargine Inject 42 Units into the skin at bedtime.   TRAVATAN Z 0.004 % Soln ophthalmic solution Generic drug:  Travoprost (BAK Free) Place 1 drop into both eyes at bedtime.   VICTOZA [employer] Inject 1.8 mLs into the skin daily.       Allergies:  Allergies  Allergen Reactions  . Aspirin Hives  . Shrimp [Shellfish Allergy] Hives    Just shrimp     Past Medical History, Surgical history, Social history, and Family History were reviewed and updated.  Review of Systems: All other 10 point review of systems is  negative.   Physical Exam:  vitals were not taken for this visit.  Wt Readings from Last 3 Encounters:  05/26/17 200 lb (90.7 kg)  04/23/17 202 lb (91.6 kg)  03/30/17 202 lb 3.2 oz (91.7 kg)    Ocular: Sclerae unicteric, pupils equal, round and reactive to light Ear-nose-throat: Oropharynx clear, dentition fair Lymphatic: No cervical, supraclavicular or axillary adenopathy Lungs no rales or rhonchi, good excursion bilaterally Heart regular rate and rhythm, no murmur appreciated Abd soft, nontender, positive bowel sounds, no liver or spleen tip palpated on exam, no fluid wave MSK no focal spinal tenderness, no joint edema Neuro: non-focal, well-oriented, appropriate affect Breasts: Deferred   Lab Results  Component Value Date   WBC 5.5  05/26/2017   HGB 9.1 (L) 05/26/2017   HCT 32.7 (L) 05/26/2017   MCV 68 (L) 05/26/2017   PLT 301 05/26/2017   Lab Results  Component Value Date   FERRITIN 6 (L) 05/26/2017   IRON 16 (L) 05/26/2017   TIBC 470 (H) 05/26/2017   UIBC 454 (H) 05/26/2017   IRONPCTSAT 3 (L) 05/26/2017   Lab Results  Component Value Date   RBC 4.84 05/26/2017   No results found for: KPAFRELGTCHN, LAMBDASER, KAPLAMBRATIO No results found for: IGGSERUM, IGA, IGMSERUM No results found for: Odetta Pink, SPEI   Chemistry      Component Value Date/Time   NA 140 05/26/2017 0856   K 4.2 05/26/2017 0856   CO2 25 05/26/2017 0856   BUN 12.7 05/26/2017 0856   CREATININE 1.2 05/26/2017 0856      Component Value Date/Time   CALCIUM 9.5 05/26/2017 0856   ALKPHOS 83 05/26/2017 0856   AST 16 05/26/2017 0856   ALT 22 05/26/2017 0856   BILITOT 0.28 05/26/2017 0856      Impression and Plan: Mr. Albro is a very pleasant 61 yo caucasian gentleman with iron deficiency anemia secondary to malabsorption. His Hgb is up to 13.2 with an MCV of 79.  He is feeling much better but still has some occasional fatigue and chills.  We will see what his iron studies show and bring him back in later this week for infusion if needed.  We will go ahead and plan to see him back again in 2 months for repeat lab work and follow-up.  He will contact our office with any questions or concerns. We can certainly see him sooner if need be.   Eliezer Bottom, NP 8/13/20181:21 PM

## 2017-07-07 LAB — IRON AND TIBC
%SAT: 12 % — AB (ref 20–55)
IRON: 43 ug/dL (ref 42–163)
TIBC: 349 ug/dL (ref 202–409)
UIBC: 306 ug/dL (ref 117–376)

## 2017-07-07 LAB — RETICULOCYTES: Reticulocyte Count: 0.9 % (ref 0.6–2.6)

## 2017-07-07 LAB — FERRITIN: FERRITIN: 57 ng/mL (ref 22–316)

## 2017-07-10 ENCOUNTER — Ambulatory Visit (HOSPITAL_BASED_OUTPATIENT_CLINIC_OR_DEPARTMENT_OTHER): Payer: 59

## 2017-07-10 VITALS — BP 137/72 | HR 89 | Temp 98.3°F | Resp 18

## 2017-07-10 DIAGNOSIS — D509 Iron deficiency anemia, unspecified: Secondary | ICD-10-CM | POA: Diagnosis not present

## 2017-07-10 DIAGNOSIS — D500 Iron deficiency anemia secondary to blood loss (chronic): Secondary | ICD-10-CM

## 2017-07-10 MED ORDER — FERUMOXYTOL INJECTION 510 MG/17 ML
510.0000 mg | Freq: Once | INTRAVENOUS | Status: AC
  Administered 2017-07-10: 510 mg via INTRAVENOUS
  Filled 2017-07-10: qty 17

## 2017-07-10 NOTE — Patient Instructions (Signed)

## 2017-07-21 IMAGING — CT CT ANGIO NECK
1 of 6 series · 12 of 46 positions shown, 18 images · IV contrast (ISOVUE)
Comparison: MRI and MRA of the head 05/22/2016

CLINICAL DATA: Occlusion and stenosis of vertebral artery. History
of stroke

EXAM:
CT ANGIOGRAPHY NECK
TECHNIQUE: Multidetector CT imaging of the neck was performed using the
standard protocol during bolus administration of intravenous
contrast. Multiplanar CT image reconstructions and MIPs were
obtained to evaluate the vascular anatomy. Carotid stenosis
measurements (when applicable) are obtained utilizing NASCET
criteria, using the distal internal carotid diameter as the
denominator.
CONTRAST:  100 mL Isovue 370 IV

[Series 4: cta neck · axial · 0.39mm/px · z∈[-238,-34]mm · 12 of 120 slices shown, 18 images]
[im 9/120  soft-tissue]
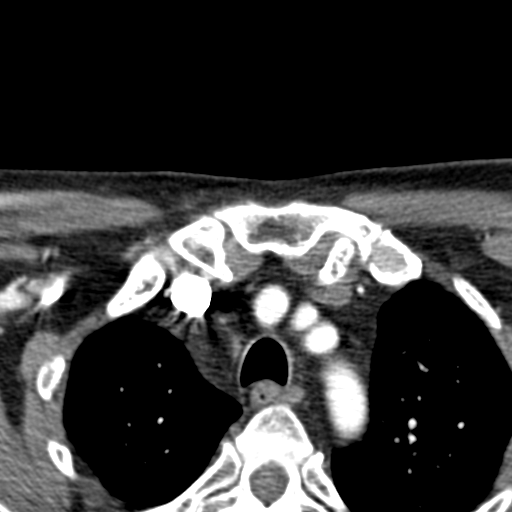
[im 9/120  bone]
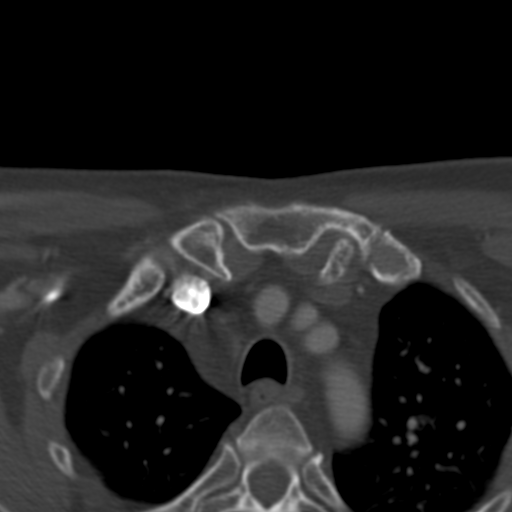
[im 18/120  soft-tissue]
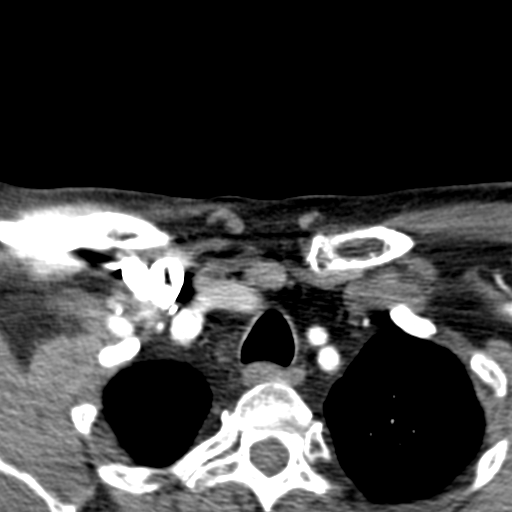
[im 26/120  soft-tissue]
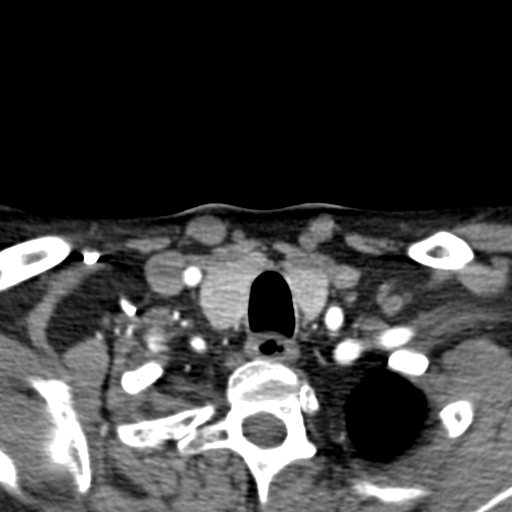
[im 35/120  soft-tissue]
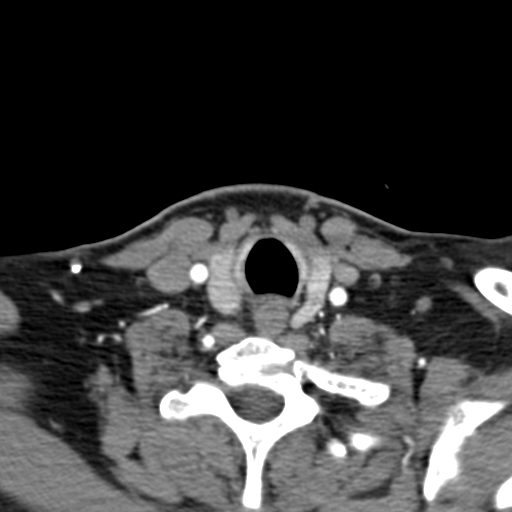
[im 43/120  soft-tissue]
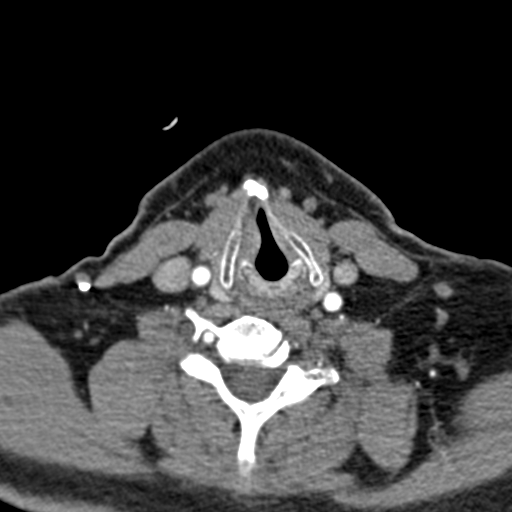
[im 52/120  soft-tissue]
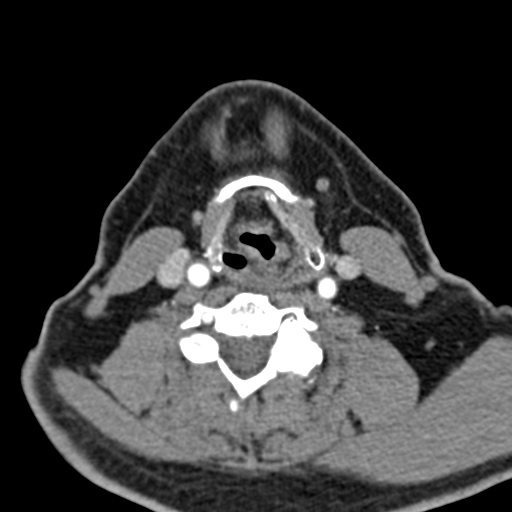
[im 69/120  soft-tissue]
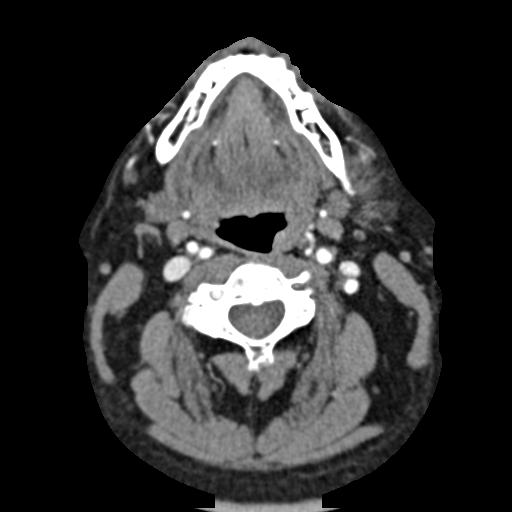
[im 77/120  soft-tissue]
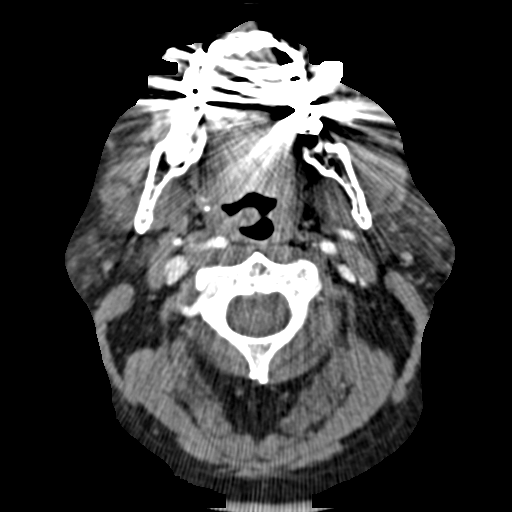
[im 86/120  soft-tissue]
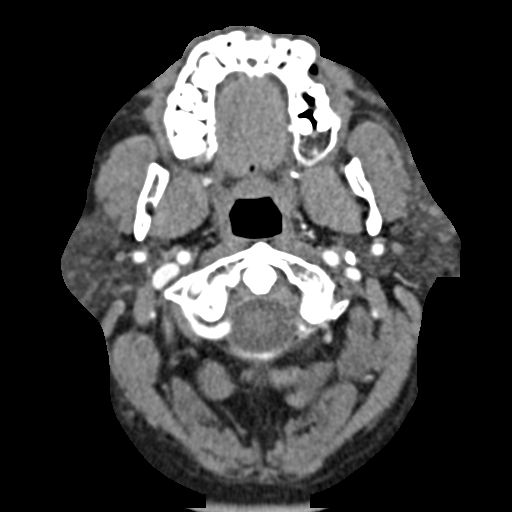
[im 86/120  lung]
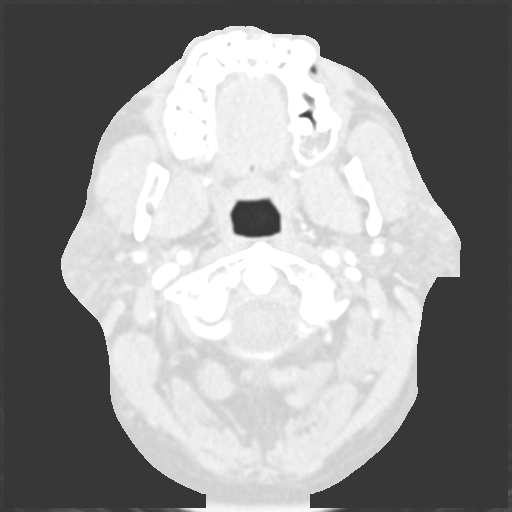
[im 86/120  bone]
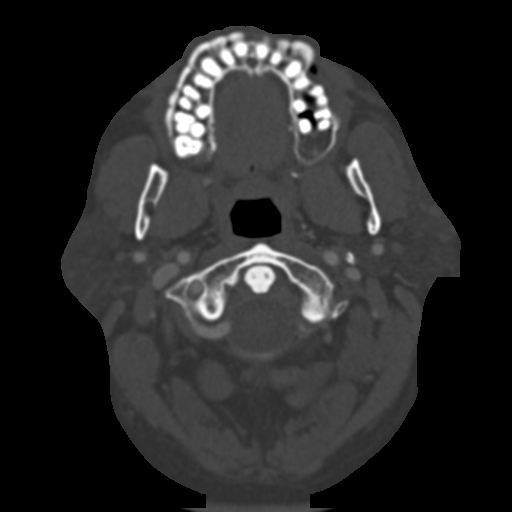
[im 94/120  soft-tissue]
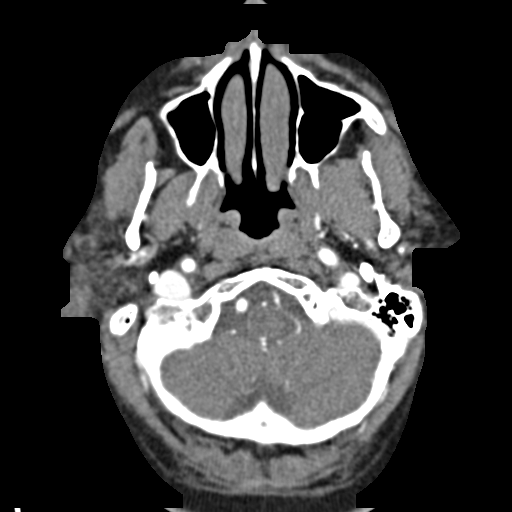
[im 94/120  lung]
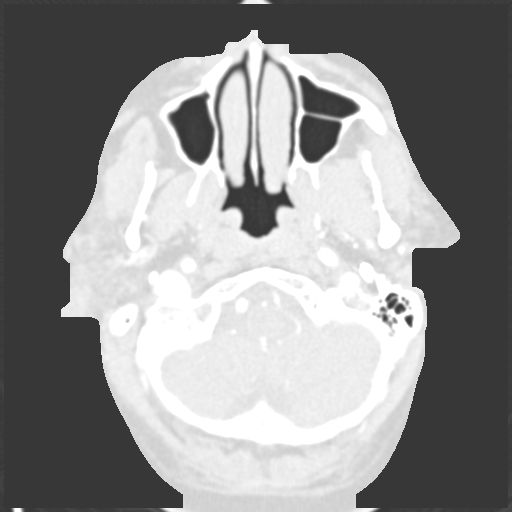
[im 103/120  soft-tissue]
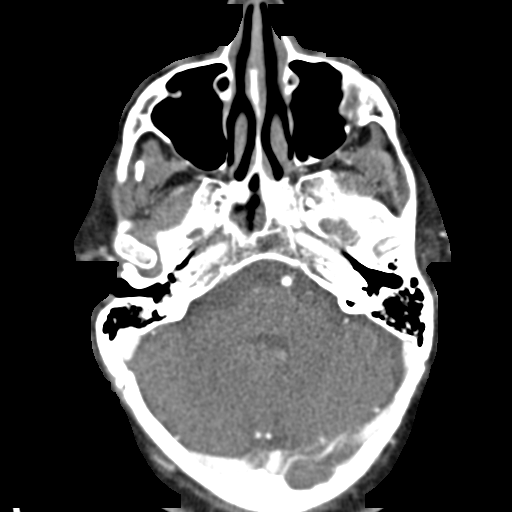
[im 103/120  lung]
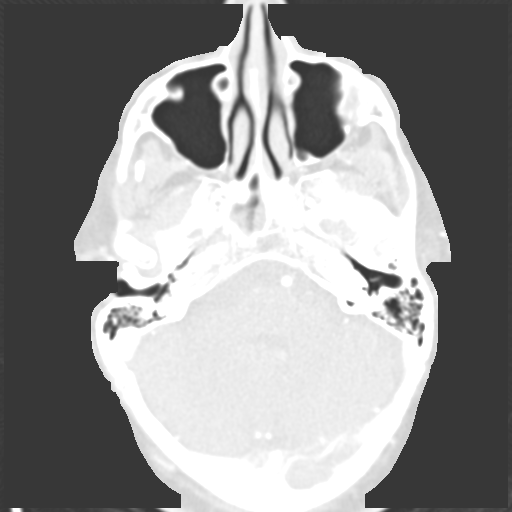
[im 111/120  soft-tissue]
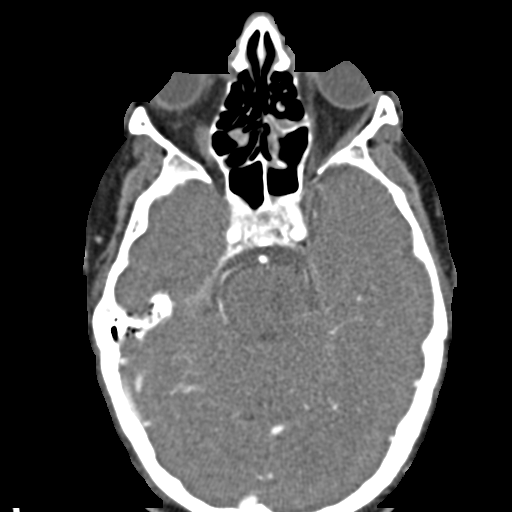
[im 111/120  lung]
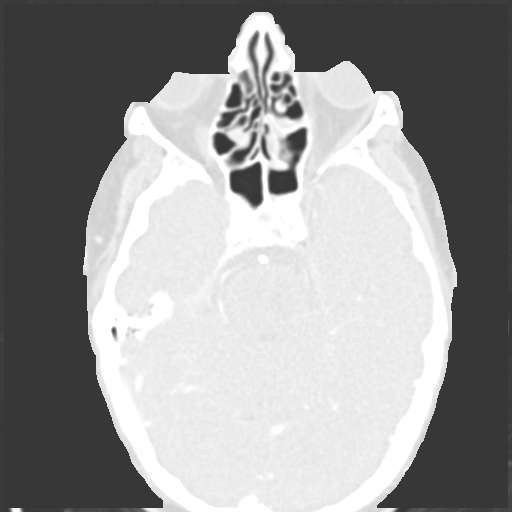

[12 of 46 positions shown; findings below may reference images not displayed]

FINDINGS: Aortic arch: Normal aortic arch. Three vessel aortic arch. Proximal
great vessels widely patent without significant atherosclerotic
disease.

Right carotid system: Normal right carotid. Negative for
atherosclerotic disease or dissection. No stenosis

Left carotid system: Normal left carotid. Negative for stenosis,
dissection, or atherosclerotic disease

Vertebral arteries:Right vertebral artery widely patent to the
basilar without stenosis or atherosclerotic disease. No dissection.

Left vertebral artery is very small and patent proximally. This
appears to occlude in the mid cervical spine with reconstitution at
the C2 level. The vessel then appears to occlude at the level of the
dura and reconstitutes distally via PICA collaterals. PICA patent
bilaterally.

Skeleton: Cervical spine degenerative changes. There is moderate
facet degeneration on the left at C3-4, C4-5, C5-6, and C6-7. Facet
overgrowth causes marked vertebral artery foramen stenosis on the
left at C5-6 which could be the cause of vertebral artery occlusion.
Additional foraminal encroachment also present due to bony
overgrowth at C3-4 and C4-5. No acute skeletal abnormality.

Other neck: Mild enlargement right lobe of the thyroid compatible
with goiter. No mass. No adenopathy in the neck.

Upper chest: Lung apices clear.  Superior mediastinum negative.
IMPRESSION: Normal carotid artery in the neck bilaterally. Normal right
vertebral artery.

Congenitally small left vertebral artery which is patent proximally
and then occludes in the mid neck with reconstitution distally via
collaterals. Left-sided facet hypertrophy causing left vertebral
artery foraminal encroachment which could be the cause of left
vertebral artery occlusion in the mid neck.

No evidence of atherosclerotic disease in the neck.

## 2017-07-28 DIAGNOSIS — E291 Testicular hypofunction: Secondary | ICD-10-CM | POA: Diagnosis not present

## 2017-08-03 DIAGNOSIS — Z7984 Long term (current) use of oral hypoglycemic drugs: Secondary | ICD-10-CM | POA: Diagnosis not present

## 2017-08-03 DIAGNOSIS — E1121 Type 2 diabetes mellitus with diabetic nephropathy: Secondary | ICD-10-CM | POA: Diagnosis not present

## 2017-08-03 DIAGNOSIS — Z794 Long term (current) use of insulin: Secondary | ICD-10-CM | POA: Diagnosis not present

## 2017-08-17 DIAGNOSIS — E291 Testicular hypofunction: Secondary | ICD-10-CM | POA: Diagnosis not present

## 2017-09-07 ENCOUNTER — Other Ambulatory Visit (HOSPITAL_BASED_OUTPATIENT_CLINIC_OR_DEPARTMENT_OTHER): Payer: 59

## 2017-09-07 ENCOUNTER — Ambulatory Visit (HOSPITAL_BASED_OUTPATIENT_CLINIC_OR_DEPARTMENT_OTHER): Payer: 59 | Admitting: Family

## 2017-09-07 VITALS — BP 160/81 | HR 86 | Temp 98.2°F | Resp 16 | Wt 201.0 lb

## 2017-09-07 DIAGNOSIS — K909 Intestinal malabsorption, unspecified: Secondary | ICD-10-CM | POA: Diagnosis not present

## 2017-09-07 DIAGNOSIS — D509 Iron deficiency anemia, unspecified: Secondary | ICD-10-CM

## 2017-09-07 DIAGNOSIS — E291 Testicular hypofunction: Secondary | ICD-10-CM | POA: Diagnosis not present

## 2017-09-07 DIAGNOSIS — D508 Other iron deficiency anemias: Secondary | ICD-10-CM

## 2017-09-07 DIAGNOSIS — Z23 Encounter for immunization: Secondary | ICD-10-CM | POA: Diagnosis not present

## 2017-09-07 LAB — CBC WITH DIFFERENTIAL (CANCER CENTER ONLY)
BASO#: 0 10*3/uL (ref 0.0–0.2)
BASO%: 0.5 % (ref 0.0–2.0)
EOS%: 1.1 % (ref 0.0–7.0)
Eosinophils Absolute: 0.1 10*3/uL (ref 0.0–0.5)
HEMATOCRIT: 47.4 % (ref 38.7–49.9)
HEMOGLOBIN: 15.9 g/dL (ref 13.0–17.1)
LYMPH#: 1.1 10*3/uL (ref 0.9–3.3)
LYMPH%: 17.2 % (ref 14.0–48.0)
MCH: 29 pg (ref 28.0–33.4)
MCHC: 33.5 g/dL (ref 32.0–35.9)
MCV: 86 fL (ref 82–98)
MONO#: 0.5 10*3/uL (ref 0.1–0.9)
MONO%: 7.6 % (ref 0.0–13.0)
NEUT%: 73.6 % (ref 40.0–80.0)
NEUTROS ABS: 4.6 10*3/uL (ref 1.5–6.5)
Platelets: 225 10*3/uL (ref 145–400)
RBC: 5.49 10*6/uL (ref 4.20–5.70)
RDW: 17.3 % — ABNORMAL HIGH (ref 11.1–15.7)
WBC: 6.2 10*3/uL (ref 4.0–10.0)

## 2017-09-07 LAB — IRON AND TIBC
%SAT: 17 % — AB (ref 20–55)
IRON: 64 ug/dL (ref 42–163)
TIBC: 378 ug/dL (ref 202–409)
UIBC: 313 ug/dL (ref 117–376)

## 2017-09-07 LAB — FERRITIN: FERRITIN: 33 ng/mL (ref 22–316)

## 2017-09-07 NOTE — Progress Notes (Signed)
Hematology and Oncology Follow Up Visit  Adam Quinn [id number] 11-Dec-1955 61 y.o. 09/07/2017   Principle Diagnosis:  Iron deficiency anemia Erythropoietin deficiency anemia  Current Therapy:   IV iron as indicated    Interim History:  Adam Quinn is here today for follow-up. He is doing quite well since receiving IV iron in August and has no complaints at this time.  His Hgb is now up to 15.9 with an MCV of 86. He denies having had any episodes of bleeding, bruising or petechiae.  No fever, chills, n/v, cough, rash, dizziness, SOB, chest pain, palpitations, abdominal pain or changes in bowel or bladder habits.  No swelling, tenderness, numbness or tingling in his extremities. No c/o pain.  He has maintained a good appetite and is staying well hydrated. His weight is stable.  He states that his recent A1c was improved at 7.7.   ECOG Performance Status: 0 - Asymptomatic  Medications:  Allergies as of 09/07/2017      Reactions   Aspirin Hives   Shrimp [shellfish Allergy] Hives   Just shrimp      Medication List       Accurate as of 09/07/17  9:12 AM. Always use your most recent med list.          ALAVERT PO Take 1 tablet by mouth at bedtime. Alavert D   clopidogrel 75 MG tablet Commonly known as:  PLAVIX Take 1 tablet (75 mg total) by mouth daily.   COQ10 PO Take 200 mg by mouth 2 (two) times daily.   ferrous fumarate 325 (106 Fe) MG Tabs tablet Commonly known as:  HEMOCYTE - 106 mg FE Take 2 tablets by mouth daily.   FREESTYLE LITE test strip Generic drug:  glucose blood   gabapentin 300 MG capsule Commonly known as:  NEURONTIN Take 300 mg by mouth daily.   ibuprofen 800 MG tablet Commonly known as:  ADVIL,MOTRIN Take 800 mg by mouth every 8 (eight) hours as needed.   lisinopril 5 MG tablet Commonly known as:  PRINIVIL,ZESTRIL Take 5 mg by mouth daily.   MELATONIN PO Take 9 mg by mouth at bedtime.   metFORMIN 500 MG 24 hr tablet Commonly  known as:  GLUCOPHAGE-XR Take 2,000 mg by mouth at bedtime. Take four tablets at bedtime.   omeprazole 40 MG capsule Commonly known as:  PRILOSEC Take 1 capsule (40 mg total) by mouth 2 (two) times daily.   sertraline 100 MG tablet Commonly known as:  ZOLOFT   simvastatin 20 MG tablet Commonly known as:  ZOCOR Take 20 mg by mouth every evening.   sodium bicarbonate 650 MG tablet Take 1 tablet (650 mg total) by mouth 2 (two) times daily.   TESTOSTERONE IM Inject into the muscle.   TOUJEO SOLOSTAR 300 UNIT/ML Sopn Generic drug:  Insulin Glargine Inject 42 Units into the skin at bedtime.   TRAVATAN Z 0.004 % Soln ophthalmic solution Generic drug:  Travoprost (BAK Free) Place 1 drop into both eyes at bedtime.   VICTOZA [employer] Inject 1.8 mLs into the skin daily.       Allergies:  Allergies  Allergen Reactions  . Aspirin Hives  . Shrimp [Shellfish Allergy] Hives    Just shrimp     Past Medical History, Surgical history, Social history, and Family History were reviewed and updated.  Review of Systems: All other 10 point review of systems is negative.   Physical Exam:  vitals were not taken for this visit.  Wt  Readings from Last 3 Encounters:  07/06/17 202 lb (91.6 kg)  05/26/17 200 lb (90.7 kg)  04/23/17 202 lb (91.6 kg)    Ocular: Sclerae unicteric, pupils equal, round and reactive to light Ear-nose-throat: Oropharynx clear, dentition fair Lymphatic: No cervical, supraclavicular or axillary adenopathy Lungs no rales or rhonchi, good excursion bilaterally Heart regular rate and rhythm, no murmur appreciated Abd soft, nontender, positive bowel sounds, no liver or spleen tip palpated on exam, no fluid wave MSK no focal spinal tenderness, no joint edema Neuro: non-focal, well-oriented, appropriate affect Breasts: Deferred   Lab Results  Component Value Date   WBC 6.0 07/06/2017   HGB 13.2 07/06/2017   HCT 42.4 07/06/2017   MCV 79 run X2 (L) 07/06/2017   PLT  218 07/06/2017   Lab Results  Component Value Date   FERRITIN 57 07/06/2017   IRON 43 07/06/2017   TIBC 349 07/06/2017   UIBC 306 07/06/2017   IRONPCTSAT 12 (L) 07/06/2017   Lab Results  Component Value Date   RBC 5.34 07/06/2017   No results found for: KPAFRELGTCHN, LAMBDASER, KAPLAMBRATIO No results found for: IGGSERUM, IGA, IGMSERUM No results found for: Odetta Pink, SPEI   Chemistry      Component Value Date/Time   NA 140 05/26/2017 0856   K 4.2 05/26/2017 0856   CO2 25 05/26/2017 0856   BUN 12.7 05/26/2017 0856   CREATININE 1.2 05/26/2017 0856      Component Value Date/Time   CALCIUM 9.5 05/26/2017 0856   ALKPHOS 83 05/26/2017 0856   AST 16 05/26/2017 0856   ALT 22 05/26/2017 0856   BILITOT 0.28 05/26/2017 0856       Impression and Plan: Adam Quinn is a very pleasant 61 yo caucasian gentleman with iron deficiency anemia secondary to malabsorption. Hgb is 15.9 with an MCV of 86. He has responded nicely to IV iron and is asymptomatic at this time.  We will see what his iron studies show and bring him back in later this week for an infusion if needed.  We will go ahead and plan to see him back in another 3 months for follow-up and lab.  He will contact our office with any questions or concerns. We can certainly see him sooner if need be.   Eliezer Bottom, NP 10/15/20189:12 AM

## 2017-09-08 LAB — RETICULOCYTES: Reticulocyte Count: 1 % (ref 0.6–2.6)

## 2017-09-11 ENCOUNTER — Ambulatory Visit (HOSPITAL_BASED_OUTPATIENT_CLINIC_OR_DEPARTMENT_OTHER): Payer: 59

## 2017-09-11 VITALS — BP 150/64 | HR 97 | Temp 98.2°F | Resp 16

## 2017-09-11 DIAGNOSIS — D509 Iron deficiency anemia, unspecified: Secondary | ICD-10-CM

## 2017-09-11 DIAGNOSIS — D500 Iron deficiency anemia secondary to blood loss (chronic): Secondary | ICD-10-CM

## 2017-09-11 MED ORDER — SODIUM CHLORIDE 0.9 % IV SOLN
510.0000 mg | Freq: Once | INTRAVENOUS | Status: AC
  Administered 2017-09-11: 510 mg via INTRAVENOUS
  Filled 2017-09-11: qty 17

## 2017-09-11 NOTE — Patient Instructions (Signed)

## 2017-09-28 DIAGNOSIS — E291 Testicular hypofunction: Secondary | ICD-10-CM | POA: Diagnosis not present

## 2017-10-13 DIAGNOSIS — E291 Testicular hypofunction: Secondary | ICD-10-CM | POA: Diagnosis not present

## 2017-10-13 DIAGNOSIS — E785 Hyperlipidemia, unspecified: Secondary | ICD-10-CM | POA: Diagnosis not present

## 2017-10-13 DIAGNOSIS — E1142 Type 2 diabetes mellitus with diabetic polyneuropathy: Secondary | ICD-10-CM | POA: Diagnosis not present

## 2017-10-13 DIAGNOSIS — E1121 Type 2 diabetes mellitus with diabetic nephropathy: Secondary | ICD-10-CM | POA: Diagnosis not present

## 2017-10-13 DIAGNOSIS — N183 Chronic kidney disease, stage 3 (moderate): Secondary | ICD-10-CM | POA: Diagnosis not present

## 2017-10-13 DIAGNOSIS — E11319 Type 2 diabetes mellitus with unspecified diabetic retinopathy without macular edema: Secondary | ICD-10-CM | POA: Diagnosis not present

## 2017-10-13 DIAGNOSIS — I129 Hypertensive chronic kidney disease with stage 1 through stage 4 chronic kidney disease, or unspecified chronic kidney disease: Secondary | ICD-10-CM | POA: Diagnosis not present

## 2017-10-19 DIAGNOSIS — E1121 Type 2 diabetes mellitus with diabetic nephropathy: Secondary | ICD-10-CM | POA: Diagnosis not present

## 2017-10-19 DIAGNOSIS — E291 Testicular hypofunction: Secondary | ICD-10-CM | POA: Diagnosis not present

## 2017-10-28 ENCOUNTER — Other Ambulatory Visit: Payer: Self-pay | Admitting: Nurse Practitioner

## 2017-10-28 DIAGNOSIS — I6381 Other cerebral infarction due to occlusion or stenosis of small artery: Secondary | ICD-10-CM

## 2017-11-05 ENCOUNTER — Other Ambulatory Visit: Payer: Self-pay | Admitting: *Deleted

## 2017-11-05 DIAGNOSIS — I6381 Other cerebral infarction due to occlusion or stenosis of small artery: Secondary | ICD-10-CM

## 2017-11-05 MED ORDER — CLOPIDOGREL BISULFATE 75 MG PO TABS: 75.0000 mg | ORAL_TABLET | Freq: Every day | ORAL | 0 refills | Status: DC

## 2017-11-05 NOTE — Telephone Encounter (Signed)
LMVM for pt that he is discharged from our stroke service and will need to get from his pcp, I will go ahead and give 2 months at this time and he is to check with his pcp to refill.  If gets from our office will need one yr RV for refills.

## 2017-11-09 DIAGNOSIS — E291 Testicular hypofunction: Secondary | ICD-10-CM | POA: Diagnosis not present

## 2017-11-23 DIAGNOSIS — E291 Testicular hypofunction: Secondary | ICD-10-CM | POA: Diagnosis not present

## 2017-12-07 ENCOUNTER — Other Ambulatory Visit: Payer: Self-pay

## 2017-12-07 ENCOUNTER — Inpatient Hospital Stay: Payer: 59 | Attending: Family | Admitting: Family

## 2017-12-07 ENCOUNTER — Inpatient Hospital Stay: Payer: 59

## 2017-12-07 VITALS — BP 120/80 | HR 94 | Temp 97.7°F | Resp 18 | Wt 202.0 lb

## 2017-12-07 DIAGNOSIS — D509 Iron deficiency anemia, unspecified: Secondary | ICD-10-CM | POA: Insufficient documentation

## 2017-12-07 DIAGNOSIS — D508 Other iron deficiency anemias: Secondary | ICD-10-CM

## 2017-12-07 DIAGNOSIS — E291 Testicular hypofunction: Secondary | ICD-10-CM | POA: Diagnosis not present

## 2017-12-07 DIAGNOSIS — D500 Iron deficiency anemia secondary to blood loss (chronic): Secondary | ICD-10-CM

## 2017-12-07 DIAGNOSIS — K909 Intestinal malabsorption, unspecified: Secondary | ICD-10-CM | POA: Diagnosis not present

## 2017-12-07 LAB — CBC WITH DIFFERENTIAL (CANCER CENTER ONLY)
BASOS ABS: 0 10*3/uL (ref 0.0–0.1)
BASOS PCT: 1 %
EOS ABS: 0.1 10*3/uL (ref 0.0–0.5)
Eosinophils Relative: 2 %
HEMATOCRIT: 51.7 % — AB (ref 38.7–49.9)
Hemoglobin: 17.2 g/dL — ABNORMAL HIGH (ref 13.0–17.1)
Lymphocytes Relative: 19 %
Lymphs Abs: 1.2 10*3/uL (ref 0.9–3.3)
MCH: 31 pg (ref 28.0–33.4)
MCHC: 33.3 g/dL (ref 32.0–35.9)
MCV: 93.2 fL (ref 82.0–98.0)
MONO ABS: 0.6 10*3/uL (ref 0.1–0.9)
MONOS PCT: 9 %
NEUTROS ABS: 4.2 10*3/uL (ref 1.5–6.5)
NEUTROS PCT: 69 %
Platelet Count: 191 10*3/uL (ref 140–400)
RBC: 5.55 MIL/uL (ref 4.20–5.70)
RDW: 14.7 % (ref 11.1–15.7)
WBC Count: 6.1 10*3/uL (ref 4.0–10.3)

## 2017-12-07 LAB — FERRITIN: FERRITIN: 65 ng/mL (ref 22–316)

## 2017-12-07 LAB — RETICULOCYTES
RBC.: 5.33 MIL/uL (ref 4.20–5.82)
Retic Count, Absolute: 85.3 10*3/uL (ref 34.8–93.9)
Retic Ct Pct: 1.6 % (ref 0.8–1.8)

## 2017-12-07 LAB — IRON AND TIBC
IRON: 69 ug/dL (ref 42–163)
SATURATION RATIOS: 20 % — AB (ref 42–163)
TIBC: 351 ug/dL (ref 202–409)
UIBC: 283 ug/dL

## 2017-12-07 NOTE — Progress Notes (Signed)
Hematology and Oncology Follow Up Visit  Adam Quinn [id number] 1956/04/07 62 y.o. 12/07/2017   Principle Diagnosis:  Iron deficiency anemia Erythropoietin deficiency anemia  Current Therapy:   IV iron as indicated    Interim History:  Adam Quinn is here today for follow-up. He is doing well and his Hgb has come up to 17.2. He is doing Testosterone injections once every 2 weeks.  No fever, chills, n/v, cough, rash, SOB, chest pain, palpitations, abdominal pain or changes in bowel or bladder habits.  He has occasional dizziness on Neurontin. No episodes of bleeding. He does bruise easily on Plavix.  No lymphadenopathy found on exam.  No swelling, tenderness, numbness or tingling in his extremities. No c/o pain.  He is eating well and staying hydrated. Weight is stable.  He states that his blood sugars have been well controlled.   ECOG Performance Status: 1 - Symptomatic but completely ambulatory  Medications:  Allergies as of 12/07/2017      Reactions   Aspirin Hives   Shrimp [shellfish Allergy] Hives   Just shrimp      Medication List        Accurate as of 12/07/17  9:06 AM. Always use your most recent med list.          ALAVERT PO Take 1 tablet by mouth at bedtime. Alavert D   clopidogrel 75 MG tablet Commonly known as:  PLAVIX Take 1 tablet (75 mg total) by mouth daily.   COQ10 PO Take 200 mg by mouth 2 (two) times daily.   FREESTYLE LITE test strip Generic drug:  glucose blood   gabapentin 300 MG capsule Commonly known as:  NEURONTIN Take 300 mg by mouth daily.   ibuprofen 800 MG tablet Commonly known as:  ADVIL,MOTRIN Take 800 mg by mouth every 8 (eight) hours as needed.   lisinopril 5 MG tablet Commonly known as:  PRINIVIL,ZESTRIL Take 5 mg by mouth daily.   MELATONIN PO Take 9 mg by mouth at bedtime.   metFORMIN 500 MG 24 hr tablet Commonly known as:  GLUCOPHAGE-XR Take 2,000 mg by mouth at bedtime. Take four tablets at bedtime.     omeprazole 40 MG capsule Commonly known as:  PRILOSEC Take 1 capsule (40 mg total) by mouth 2 (two) times daily.   sertraline 100 MG tablet Commonly known as:  ZOLOFT   simvastatin 20 MG tablet Commonly known as:  ZOCOR Take 20 mg by mouth every evening.   sodium bicarbonate 650 MG tablet Take 1 tablet (650 mg total) by mouth 2 (two) times daily.   TESTOSTERONE IM Inject into the muscle.   TOUJEO SOLOSTAR 300 UNIT/ML Sopn Generic drug:  Insulin Glargine Inject 42 Units into the skin at bedtime.   TRAVATAN Z 0.004 % Soln ophthalmic solution Generic drug:  Travoprost (BAK Free) Place 1 drop into both eyes at bedtime.       Allergies:  Allergies  Allergen Reactions  . Aspirin Hives  . Shrimp [Shellfish Allergy] Hives    Just shrimp     Past Medical History, Surgical history, Social history, and Family History were reviewed and updated.  Review of Systems: All other 10 point review of systems is negative.   Physical Exam:  vitals were not taken for this visit.   Wt Readings from Last 3 Encounters:  09/07/17 201 lb (91.2 kg)  07/06/17 202 lb (91.6 kg)  05/26/17 200 lb (90.7 kg)    Ocular: Sclerae unicteric, pupils equal, round and reactive  to light Ear-nose-throat: Oropharynx clear, dentition fair Lymphatic: No cervical, supraclavicular or axillary adenopathy Lungs no rales or rhonchi, good excursion bilaterally Heart regular rate and rhythm, no murmur appreciated Abd soft, nontender, positive bowel sounds, no liver or spleen tip palpated on exam, no fluid wave  MSK no focal spinal tenderness, no joint edema Neuro: non-focal, well-oriented, appropriate affect Breasts: Deferred   Lab Results  Component Value Date   WBC 6.2 09/07/2017   HGB 15.9 09/07/2017   HCT 47.4 09/07/2017   MCV 86 09/07/2017   PLT 225 09/07/2017   Lab Results  Component Value Date   FERRITIN 33 09/07/2017   IRON 64 09/07/2017   TIBC 378 09/07/2017   UIBC 313 09/07/2017    IRONPCTSAT 17 (L) 09/07/2017   Lab Results  Component Value Date   RBC 5.49 09/07/2017   No results found for: KPAFRELGTCHN, LAMBDASER, KAPLAMBRATIO No results found for: IGGSERUM, IGA, IGMSERUM No results found for: Odetta Pink, SPEI   Chemistry      Component Value Date/Time   NA 140 05/26/2017 0856   K 4.2 05/26/2017 0856   CO2 25 05/26/2017 0856   BUN 12.7 05/26/2017 0856   CREATININE 1.2 05/26/2017 0856      Component Value Date/Time   CALCIUM 9.5 05/26/2017 0856   ALKPHOS 83 05/26/2017 0856   AST 16 05/26/2017 0856   ALT 22 05/26/2017 0856   BILITOT 0.28 05/26/2017 0856      Impression and Plan: Adam Quinn is a very pleasant 62 yo caucasian gentleman with iron deficiency anemia secondary to malabsorption. 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  We will continue to follow along with him and plan to see him back in another 4 months for follow-up and lab.  He will contact our office with any questions or concerns. We can certainly see him sooner if need be.   Adam Peace, NP 1/14/20199:06 AM

## 2017-12-21 DIAGNOSIS — E291 Testicular hypofunction: Secondary | ICD-10-CM | POA: Diagnosis not present

## 2018-01-04 DIAGNOSIS — E291 Testicular hypofunction: Secondary | ICD-10-CM | POA: Diagnosis not present

## 2018-01-18 DIAGNOSIS — E291 Testicular hypofunction: Secondary | ICD-10-CM | POA: Diagnosis not present

## 2018-02-01 DIAGNOSIS — E291 Testicular hypofunction: Secondary | ICD-10-CM | POA: Diagnosis not present

## 2018-02-15 DIAGNOSIS — E291 Testicular hypofunction: Secondary | ICD-10-CM | POA: Diagnosis not present

## 2018-02-15 DIAGNOSIS — Z7984 Long term (current) use of oral hypoglycemic drugs: Secondary | ICD-10-CM | POA: Diagnosis not present

## 2018-02-15 DIAGNOSIS — E1121 Type 2 diabetes mellitus with diabetic nephropathy: Secondary | ICD-10-CM | POA: Diagnosis not present

## 2018-03-01 DIAGNOSIS — E291 Testicular hypofunction: Secondary | ICD-10-CM | POA: Diagnosis not present

## 2018-03-15 DIAGNOSIS — E291 Testicular hypofunction: Secondary | ICD-10-CM | POA: Diagnosis not present

## 2018-03-29 DIAGNOSIS — E291 Testicular hypofunction: Secondary | ICD-10-CM | POA: Diagnosis not present

## 2018-04-05 ENCOUNTER — Encounter: Payer: Self-pay | Admitting: Gastroenterology

## 2018-04-05 ENCOUNTER — Inpatient Hospital Stay (HOSPITAL_BASED_OUTPATIENT_CLINIC_OR_DEPARTMENT_OTHER): Payer: 59 | Admitting: Family

## 2018-04-05 ENCOUNTER — Inpatient Hospital Stay: Payer: 59

## 2018-04-05 ENCOUNTER — Other Ambulatory Visit: Payer: Self-pay

## 2018-04-05 ENCOUNTER — Inpatient Hospital Stay: Payer: 59 | Attending: Hematology & Oncology

## 2018-04-05 VITALS — BP 141/80 | HR 106 | Temp 98.1°F | Resp 16 | Wt 198.0 lb

## 2018-04-05 DIAGNOSIS — Z8673 Personal history of transient ischemic attack (TIA), and cerebral infarction without residual deficits: Secondary | ICD-10-CM | POA: Insufficient documentation

## 2018-04-05 DIAGNOSIS — D582 Other hemoglobinopathies: Secondary | ICD-10-CM

## 2018-04-05 DIAGNOSIS — G629 Polyneuropathy, unspecified: Secondary | ICD-10-CM | POA: Diagnosis not present

## 2018-04-05 DIAGNOSIS — D509 Iron deficiency anemia, unspecified: Secondary | ICD-10-CM | POA: Insufficient documentation

## 2018-04-05 DIAGNOSIS — E119 Type 2 diabetes mellitus without complications: Secondary | ICD-10-CM | POA: Diagnosis not present

## 2018-04-05 DIAGNOSIS — E291 Testicular hypofunction: Secondary | ICD-10-CM | POA: Diagnosis not present

## 2018-04-05 DIAGNOSIS — K909 Intestinal malabsorption, unspecified: Secondary | ICD-10-CM

## 2018-04-05 DIAGNOSIS — D508 Other iron deficiency anemias: Secondary | ICD-10-CM

## 2018-04-05 LAB — CBC WITH DIFFERENTIAL (CANCER CENTER ONLY)
Basophils Absolute: 0 10*3/uL (ref 0.0–0.1)
Basophils Relative: 1 %
Eosinophils Absolute: 0.1 10*3/uL (ref 0.0–0.5)
Eosinophils Relative: 1 %
HEMATOCRIT: 54.6 % — AB (ref 38.7–49.9)
HEMOGLOBIN: 18.4 g/dL — AB (ref 13.0–17.1)
LYMPHS PCT: 16 %
Lymphs Abs: 1.1 10*3/uL (ref 0.9–3.3)
MCH: 29.9 pg (ref 28.0–33.4)
MCHC: 33.7 g/dL (ref 32.0–35.9)
MCV: 88.8 fL (ref 82.0–98.0)
MONO ABS: 0.6 10*3/uL (ref 0.1–0.9)
MONOS PCT: 8 %
NEUTROS ABS: 5.2 10*3/uL (ref 1.5–6.5)
Neutrophils Relative %: 74 %
Platelet Count: 209 10*3/uL (ref 145–400)
RBC: 6.15 MIL/uL — ABNORMAL HIGH (ref 4.20–5.70)
RDW: 13.9 % (ref 11.1–15.7)
WBC Count: 7 10*3/uL (ref 4.0–10.0)

## 2018-04-05 LAB — IRON AND TIBC
IRON: 73 ug/dL (ref 42–163)
SATURATION RATIOS: 19 % — AB (ref 42–163)
TIBC: 390 ug/dL (ref 202–409)
UIBC: 317 ug/dL

## 2018-04-05 LAB — FERRITIN: Ferritin: 40 ng/mL (ref 22–316)

## 2018-04-05 LAB — RETICULOCYTES
RBC.: 6.01 MIL/uL — AB (ref 4.20–5.82)
Retic Count, Absolute: 78.1 10*3/uL (ref 34.8–93.9)
Retic Ct Pct: 1.3 % (ref 0.8–1.8)

## 2018-04-05 NOTE — Progress Notes (Signed)
Hematology and Oncology Follow Up Visit  JAHFARI AMBERS [id number] 17-Sep-1956 62 y.o. 04/05/2018   Principle Diagnosis:  Iron deficiency anemia Erythropoietin deficiency anemia  Current Therapy:   IV iron as indicated    Interim History:  Mr. Utsey is here today for follow-up. He is feeling fatigued and has had some episodes of blurred vision and dizziness. He has been receiving testosterone injections every 2 weeks and his Hgb is now 18.4 and Hct 54.6. 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 With his elevated Hgb is at significant risk of having another stroke and/or development of thrombus.  He uses his CPAP at night and has a follow-up with his SA doctor later this month.  He has uncontrolled diabetes and states that he was changed to Trulicity in May.  No fever, chills, n/v, cough, rash, SOB, chest pain, palpitations, abdominal pain or changes in bowel or bladder habits.  No bruising, bleeding or petechiae. No lymphadenopathy noted on exam.  The neuropathy in his hands and feet is unchanged. No swelling or tenderness in his extremities.  He has no appetite but is staying hydrated. His weight is stable.   ECOG Performance Status: 1 - Symptomatic but completely ambulatory  Medications:  Allergies as of 04/05/2018      Reactions   Aspirin Hives   Shrimp [shellfish Allergy] Hives   Just shrimp      Medication List        Accurate as of 04/05/18 11:13 AM. Always use your most recent med list.          ALAVERT PO Take 1 tablet by mouth at bedtime. Alavert D   clopidogrel 75 MG tablet Commonly known as:  PLAVIX Take 1 tablet (75 mg total) by mouth daily.   COQ10 PO Take 200 mg by mouth 2 (two) times daily.   dorzolamide 2 % ophthalmic solution Commonly known as:  TRUSOPT   FREESTYLE LITE test strip Generic drug:  glucose blood   gabapentin 300 MG capsule Commonly known as:  NEURONTIN Take 300 mg by mouth  daily.   ibuprofen 800 MG tablet Commonly known as:  ADVIL,MOTRIN Take 800 mg by mouth every 8 (eight) hours as needed.   lisinopril 5 MG tablet Commonly known as:  PRINIVIL,ZESTRIL Take 5 mg by mouth daily.   MELATONIN PO Take 9 mg by mouth at bedtime.   metFORMIN 500 MG 24 hr tablet Commonly known as:  GLUCOPHAGE-XR Take 2,000 mg by mouth at bedtime. Take four tablets at bedtime.   omeprazole 40 MG capsule Commonly known as:  PRILOSEC Take 1 capsule (40 mg total) by mouth 2 (two) times daily.   sertraline 100 MG tablet Commonly known as:  ZOLOFT daily.   simvastatin 20 MG tablet Commonly known as:  ZOCOR Take 20 mg by mouth every evening.   sodium bicarbonate 650 MG tablet Take 1 tablet (650 mg total) by mouth 2 (two) times daily.   TESTOSTERONE IM Inject into the muscle. Every two weeks   TOUJEO SOLOSTAR 300 UNIT/ML Sopn Generic drug:  Insulin Glargine Inject 44 Units into the skin at bedtime.   TRAVATAN Z 0.004 % Soln ophthalmic solution Generic drug:  Travoprost (BAK Free) Place 1 drop into both eyes at bedtime.   TRULICITY 8.12 XN/1.7GY Sopn Generic drug:  Dulaglutide Inject into the skin.       Allergies:  Allergies  Allergen Reactions  . Aspirin Hives  . Shrimp [  Shellfish Allergy] Hives    Just shrimp     Past Medical History, Surgical history, Social history, and Family History were reviewed and updated.  Review of Systems: All other 10 point review of systems is negative.   Physical Exam:  weight is 198 lb (89.8 kg). His oral temperature is 98.1 F (36.7 C). His blood pressure is 141/80 (abnormal) and his pulse is 106 (abnormal). His respiration is 16 and oxygen saturation is 95%.   Wt Readings from Last 3 Encounters:  04/05/18 198 lb (89.8 kg)  12/07/17 202 lb (91.6 kg)  09/07/17 201 lb (91.2 kg)    Ocular: Sclerae unicteric, pupils equal, round and reactive to light Ear-nose-throat: Oropharynx clear, dentition fair Lymphatic: No  cervical, supraclavicular or axillary adenopathy Lungs no rales or rhonchi, good excursion bilaterally Heart regular rate and rhythm, no murmur appreciated Abd soft, nontender, positive bowel sounds, no liver or spleen tip palpated on exam, no fluid wave  MSK no focal spinal tenderness, no joint edema Neuro: non-focal, well-oriented, appropriate affect Breasts: Deferred   Lab Results  Component Value Date   WBC 7.0 04/05/2018   HGB 18.4 (H) 04/05/2018   HCT 54.6 (H) 04/05/2018   MCV 88.8 04/05/2018   PLT 209 04/05/2018   Lab Results  Component Value Date   FERRITIN 65 12/07/2017   IRON 69 12/07/2017   TIBC 351 12/07/2017   UIBC 283 12/07/2017   IRONPCTSAT 20 (L) 12/07/2017   Lab Results  Component Value Date   RETICCTPCT 1.6 12/07/2017   RBC 6.15 (H) 04/05/2018   No results found for: KPAFRELGTCHN, LAMBDASER, KAPLAMBRATIO No results found for: IGGSERUM, IGA, IGMSERUM No results found for: Odetta Pink, SPEI   Chemistry      Component Value Date/Time   NA 140 05/26/2017 0856   K 4.2 05/26/2017 0856   CO2 25 05/26/2017 0856   BUN 12.7 05/26/2017 0856   CREATININE 1.2 05/26/2017 0856      Component Value Date/Time   CALCIUM 9.5 05/26/2017 0856   ALKPHOS 83 05/26/2017 0856   AST 16 05/26/2017 0856   ALT 22 05/26/2017 0856   BILITOT 0.28 05/26/2017 0856      Impression and Plan: Mr. 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  He now has a high Hgb/Hct secondary to frequent testosterone injections. 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 I spoke with Dr. Marin Olp and we will phlebotomize him today and again next week. He will hold his testosterone for now and we will recheck at his follow-up in 1 month.  He would like to have his testosterone checked and given here as it is much closer to home.  He  will contact our office with any questions or concerns. We can certainly see him sooner if need be.   Laverna Peace, NP 5/13/201911:13 AM

## 2018-04-05 NOTE — Progress Notes (Signed)
Adam Quinn presents today for phlebotomy per MD orders. Phlebotomy procedure started at 1110 and ended at 1130 via 18 g angio cath to left ac. 550 grams removed. Patient observed for 30 minutes after procedure without any incident.  Snack and drink taken.  Patient tolerated procedure well. IV needle removed intact.  Pressure dressing clean, dry and intact to left ac at time of discharge.  VS stable.  Phlebotomy discharge instructions reviewed with pt.  Teach back done.

## 2018-04-05 NOTE — Patient Instructions (Signed)

## 2018-04-06 ENCOUNTER — Other Ambulatory Visit: Payer: 59

## 2018-04-06 ENCOUNTER — Ambulatory Visit: Payer: 59 | Admitting: Family

## 2018-04-12 ENCOUNTER — Inpatient Hospital Stay: Payer: 59

## 2018-04-12 VITALS — BP 136/74 | HR 98

## 2018-04-12 DIAGNOSIS — D500 Iron deficiency anemia secondary to blood loss (chronic): Secondary | ICD-10-CM

## 2018-04-12 DIAGNOSIS — D582 Other hemoglobinopathies: Secondary | ICD-10-CM

## 2018-04-12 MED ORDER — SODIUM CHLORIDE 0.9 % IV SOLN: 510.0000 mg | Freq: Once | INTRAVENOUS | Status: DC

## 2018-04-12 NOTE — Patient Instructions (Signed)
Therapeutic Phlebotomy Therapeutic phlebotomy is the controlled removal of blood from a person's body for the purpose of treating a medical condition. The procedure is similar to donating blood. Usually, about a pint (470 mL, or 0.47L) of blood is removed. The average adult has 9-12 pints (4.3-5.7 L) of blood. Therapeutic phlebotomy may be used to treat the following medical conditions:  Hemochromatosis. This is a condition in which the blood contains too much iron.  Polycythemia vera. This is a condition in which the blood contains too many red blood cells.  Porphyria cutanea tarda. This is a disease in which an important part of hemoglobin is not made properly. It results in the buildup of abnormal amounts of porphyrins in the body.  Sickle cell disease. This is a condition in which the red blood cells form an abnormal crescent shape rather than a round shape.  Tell a health care provider about:  Any allergies you have.  All medicines you are taking, including vitamins, herbs, eye drops, creams, and over-the-counter medicines.  Any problems you or family members have had with anesthetic medicines.  Any blood disorders you have.  Any surgeries you have had.  Any medical conditions you have. What are the risks? Generally, this is a safe procedure. However, problems may occur, including:  Nausea or light-headedness.  Low blood pressure.  Soreness, bleeding, swelling, or bruising at the needle insertion site.  Infection.  What happens before the procedure?  Follow instructions from your health care provider about eating or drinking restrictions.  Ask your health care provider about changing or stopping your regular medicines. This is especially important if you are taking diabetes medicines or blood thinners.  Wear clothing with sleeves that can be raised above the elbow.  Plan to have someone take you home after the procedure.  You may have a blood sample taken. What  happens during the procedure?  A needle will be inserted into one of your veins.  Tubing and a collection bag will be attached to that needle.  Blood will flow through the needle and tubing into the collection bag.  You may be asked to open and close your hand slowly and continually during the entire collection.  After the specified amount of blood has been removed from your body, the collection bag and tubing will be clamped.  The needle will be removed from your vein.  Pressure will be held on the site of the needle insertion to stop the bleeding.  A bandage (dressing) will be placed over the needle insertion site. The procedure may vary among health care providers and hospitals. What happens after the procedure?  Your recovery will be assessed and monitored.  You can return to your normal activities as directed by your health care provider. This information is not intended to replace advice given to you by your health care provider. Make sure you discuss any questions you have with your health care provider. Document Released: 04/14/2011 Document Revised: 07/12/2016 Document Reviewed: 11/06/2014 Elsevier Interactive Patient Education  2018 Elsevier Inc.  

## 2018-04-12 NOTE — Progress Notes (Signed)
Adam Quinn presents today for phlebotomy per MD orders. Phlebotomy procedure started at 0900 and ended at 0920 via 18 g angio cath to left ac.  525 grams removed. Patient observed for 30 minutes after procedure without any incident.  Snack and drink taken.  Patient tolerated procedure well. IV needle removed intact.  Pressure dressing to left ac clean, dry and intact at time of discharge.

## 2018-04-22 ENCOUNTER — Encounter: Payer: Self-pay | Admitting: Nurse Practitioner

## 2018-04-22 NOTE — Progress Notes (Signed)
[facility]  PATIENT: Adam Quinn DOB: 1956/05/31   REASON FOR VISIT: Follow-up for obstructive sleep apnea with CPAP HISTORY FROM: Patient    HISTORY OF PRESENT ILLNESS:UPDATE 6/3/2019CM Adam Quinn, 62 year old male returns for follow-up for his obstructive sleep apnea and CPAP compliance.  He has no issues with his CPAP machine.  Data dated 03/24/2018-04/22/2018 shows compliance greater than 4 hours at 93%.  Average usage 7 hours 35 minutes.  Set pressure 7 cm.  EPR level 3 AHI 4.7 ESS 3.  He has a history of iron deficiency anemia secondary to malabsorption.  Has a significant history of high hemoglobin hematocrit secondary to frequent testosterone injections.  He gets periodic phlebotomies.  He returns for reevaluation. 04/23/17 CDMr. Quinn underwent a CPAP titration study following a baseline polysomnogram which revealed an AHI of 11.6 and an oxygen nadir of 75% desaturation. He was titrated to 7 cm water pressure. It was also recommended that he reduce his caffeine intake. He had reported REM BD- and was asked t use melatonin before any prescription medication w should be used. He takes 9 mg with good success.  Interval history the patient is here for his yearly revisit dedicated to CPAP compliance. He has used his machine 97% of the last 30 days with an average user time of 7 hours and 19 minutes, CPAP is still set at 7 cm water with an EPR of 3 cm water the residual AHI is 3.5 he does have occasional air leaks. 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   Adam Quinn is a 62 year old, right-handed, Caucasian married male who developed sudden onset of left face and neck as well as left arm pain with paresthesias Tesio has been 03/08/2016. He had just lifted some heavy boxes he was moving. He described a feeling of a sensation on the top of his head and  face when from their descending. Interesting is that it lasted a couple of weeks therefore he did not seek immediate help. An MRI scan of the cervical spine has been performed by Dr. Laurann Montana, and there was no significant compression noted and next Dr. Ellene Route ordered an MRI scan of the brain  This was performed on 05/22/2016 and it showed an old left pontine paramedian lacunar infarct and multiple smaller lacunar infarcts bilaterally the left vertebral artery was blocked. Dr. Leonie Man evaluated the vascular risk factors in his previous note.   The patient does not smoke has nor has family history of cardiac disease but family history of multiple family members having strokes. He has an allergy to aspirin. Since he reported also a history of snoring and restless sleep and daytime fatigue Dr. Leonie Man had felt it  indicated to order a sleep study. Adam Quinn reports that in April 2017 he and his wife had a stress full time, caring for his in laws. Soon after they had to move their household. He works as an Passenger transport manager.  Adam Quinn is office bound and very sedentary.   Sleep habits are as follows: The patient usually goes to bed and falls asleep promptly at about 11 PM, his bedroom is cool and quiet and dark. He shares a bed with his wife, who has noted him to snore loudly and she has also reported that he sits up in bed gasping for breath. He has none or one bathroom break at  night this usually does not fragment his sleep. He sleeps usually through until 6 AM in the morning. He relies in an alarm to wake him for work. He sleeps on 2 pillows and he prefers to go to sleep on his right side. He spends a lot of time sleeping supine. Over the last couple of years he has more often acted out upon a dream and he reports plenty of visit dreams. He has hit his wife accidentally he has rash or kicked in bed and he has woken himself sleep talking or yelling. He does not sleep walk. Sometimes he is woken up by his acid  reflux condition.   REVIEW OF SYSTEMS: Full 14 system review of systems performed and notable only for those listed, all others are neg:  Constitutional: neg  Cardiovascular: neg Ear/Nose/Throat: neg  Skin: neg Eyes: neg Respiratory: neg Gastroitestinal: neg  Hematology/Lymphatic: neg  Endocrine: neg Musculoskeletal:neg Allergy/Immunology: neg Neurological: neg Psychiatric: neg Sleep : Obstructive sleep apnea with CPAP   ALLERGIES: Allergies  Allergen Reactions  . Aspirin Hives  . Shrimp [Shellfish Allergy] Hives    Just shrimp     HOME MEDICATIONS: Outpatient Medications Prior to Visit  Medication Sig Dispense Refill  . clopidogrel (PLAVIX) 75 MG tablet Take 1 tablet (75 mg total) by mouth daily. 90 tablet 0  . Coenzyme Q10 (COQ10 PO) Take 200 mg by mouth 2 (two) times daily.    . dorzolamide (TRUSOPT) 2 % ophthalmic solution   3  . FREESTYLE LITE test strip   3  . gabapentin (NEURONTIN) 300 MG capsule Take 300 mg by mouth daily.    Marland Kitchen ibuprofen (ADVIL,MOTRIN) 800 MG tablet Take 800 mg by mouth every 8 (eight) hours as needed.    Marland Kitchen lisinopril (PRINIVIL,ZESTRIL) 5 MG tablet Take 5 mg by mouth daily.    . Loratadine (ALAVERT PO) Take 1 tablet by mouth at bedtime. Alavert D    . MELATONIN PO Take 9 mg by mouth at bedtime.     . metFORMIN (GLUCOPHAGE-XR) 500 MG 24 hr tablet Take 2,000 mg by mouth at bedtime. Take four tablets at bedtime.    Marland Kitchen omeprazole (PRILOSEC) 40 MG capsule Take 1 capsule (40 mg total) by mouth 2 (two) times daily. 60 capsule 3  . sertraline (ZOLOFT) 100 MG tablet daily.   1  . simvastatin (ZOCOR) 20 MG tablet Take 20 mg by mouth every evening.    . sodium bicarbonate 650 MG tablet Take 1 tablet (650 mg total) by mouth 2 (two) times daily. 60 tablet 3  . TESTOSTERONE IM Inject into the muscle. Every two weeks    . TOUJEO SOLOSTAR 300 UNIT/ML SOPN Inject 44 Units into the skin at bedtime.   1  . Travoprost, BAK Free, (TRAVATAN Z) 0.004 % SOLN ophthalmic  solution Place 1 drop into both eyes at bedtime.    . TRULICITY 6.14 ER/1.5QM SOPN Inject into the skin.  5   No facility-administered medications prior to visit.     PAST MEDICAL HISTORY: Past Medical History:  Diagnosis Date  . Allergy    seasonal  . Anemia   . Anxiety   . Cataract    cataract surgery bilaterally resolved issues  . Chronic kidney disease    small protien showing uses Lisinopril for this  . Diverticulitis   . Diverticulosis   . DM (diabetes mellitus) ([place])   . GERD (gastroesophageal reflux disease)   . Glaucoma   . History of kidney stones  x1  . Stroke [facility])     PAST SURGICAL HISTORY: Past Surgical History:  Procedure Laterality Date  . cataract surgery     bilateral  . COLONOSCOPY    . COLONOSCOPY N/A 06/04/2015   Procedure: COLONOSCOPY;  Surgeon: Inda Castle, MD;  Location: [facility]COPY;  Service: Endoscopy;  Laterality: N/A;  . ESOPHAGOGASTRODUODENOSCOPY N/A 06/04/2015   Procedure: ESOPHAGOGASTRODUODENOSCOPY (EGD);  Surgeon: Inda Castle, MD;  Location: [facility];  Service: Endoscopy;  Laterality: N/A;  . UPPER GASTROINTESTINAL ENDOSCOPY    . WISDOM TOOTH EXTRACTION     with sedation    FAMILY HISTORY: Family History  Problem Relation Age of Onset  . Colon polyps Father   . Lung cancer Father   . Diabetes Mother   . Dementia Mother   . Diabetes Sister        x 2  . Stroke Maternal Grandmother   . Stroke Paternal Grandfather   . Colon cancer Neg Hx   . Rectal cancer Neg Hx   . Stomach cancer Neg Hx     SOCIAL HISTORY: Social History   Socioeconomic History  . Marital status: Married    Spouse name: Not on file  . Number of children: 3  . Years of education: Not on file  . Highest education level: Not on file  Occupational History  . Occupation: Optometrist  Social Needs  . Financial resource strain: Not on file  . Food insecurity:    Worry: Not on file    Inability: Not on file  . Transportation needs:     Medical: Not on file    Non-medical: Not on file  Tobacco Use  . Smoking status: Former Smoker    Last attempt to quit: 10/04/1997    Years since quitting: 20.5  . Smokeless tobacco: Never Used  Substance and Sexual Activity  . Alcohol use: No  . Drug use: No  . Sexual activity: Not on file  Lifestyle  . Physical activity:    Days per week: Not on file    Minutes per session: Not on file  . Stress: Not on file  Relationships  . Social connections:    Talks on phone: Not on file    Gets together: Not on file    Attends religious service: Not on file    Active member of club or organization: Not on file    Attends meetings of clubs or organizations: Not on file    Relationship status: Not on file  . Intimate partner violence:    Fear of current or ex partner: Not on file    Emotionally abused: Not on file    Physically abused: Not on file    Forced sexual activity: Not on file  Other Topics Concern  . Not on file  Social History Narrative  . Not on file     PHYSICAL EXAM  Vitals:   04/26/18 1506  BP: 114/61  Pulse: 97  Weight: 199 lb 9.6 oz (90.5 kg)  Height: 5\' 8"  (1.727 m)   Body mass index is 30.35 kg/m.  Generalized: Well developed, obese male in no acute distress  Head: normocephalic and atraumatic,. Oropharynx benign mallopatti 4 Neck: Supple, circumference 17.5 Musculoskeletal: No deformity   Neurological examination   Mentation: Alert oriented to time, place, history taking. Attention span and concentration appropriate. Recent and remote memory intact.  Follows all commands speech and language fluent.   Cranial nerve II-XII: Pupils were equal round reactive to light  extraocular movements were full, visual field were full on confrontational test. Facial sensation and strength were normal. hearing was intact to finger rubbing bilaterally. Uvula tongue midline. head turning and shoulder shrug were normal and symmetric.Tongue protrusion into cheek strength  was normal. Motor: normal bulk and tone, full strength in the BUE, BLE,  Sensory: normal and symmetric to light touch,  Coordination: finger-nose-finger,  no dysmetria Gait and Station: Rising up from seated position without assistance, normal stance,  moderate stride, good arm swing, smooth turning, able to perform tiptoe, and heel walking without difficulty. Tandem gait is steady, no assistive device  DIAGNOSTIC DATA (LABS, IMAGING, TESTING) - I reviewed patient records, labs, notes, testing and imaging myself where available.  Lab Results  Component Value Date   WBC 7.0 04/05/2018   HGB 18.4 (H) 04/05/2018   HCT 54.6 (H) 04/05/2018   MCV 88.8 04/05/2018   PLT 209 04/05/2018      Component Value Date/Time   NA 140 05/26/2017 0856   K 4.2 05/26/2017 0856   CO2 25 05/26/2017 0856   GLUCOSE 175 (H) 05/26/2017 0856   BUN 12.7 05/26/2017 0856   CREATININE 1.2 05/26/2017 0856   CALCIUM 9.5 05/26/2017 0856   PROT 6.9 05/26/2017 0856   ALBUMIN 3.7 05/26/2017 0856   AST 16 05/26/2017 0856   ALT 22 05/26/2017 0856   ALKPHOS 83 05/26/2017 0856   BILITOT 0.28 05/26/2017 0856    Lab Results  Component Value Date   VITAMINB12 290 05/26/2017       ASSESSMENT AND PLAN  62 y.o. year old male  has a past medical history of Allergy, Anemia, Anxiety, Chronic kidney disease, Diverticulitis, Diverticulosis, DM (diabetes mellitus) ([place]), Stroke ([place]).iron deficiency anemia secondary to malabsorption.  Has a significant history of high hemoglobin hematocrit secondary to frequent testosterone injections.  He gets periodic phlebotomies.  He is here to follow-up for his CPAP compliance.Data dated 03/24/2018-04/22/2018 shows compliance greater than 4 hours at 93%.  Average usage 7 hours 35 minutes.  Set pressure 7 cm.  EPR level 3 AHI 4.7 ESS 3.     PLAN: CPAP compliance 93% Continue same settings will order supplies F/U yearly and prn Dennie Bible, [facility], [facility], APRN  [facility] [address] [place], [place] [address] [phone]

## 2018-04-26 ENCOUNTER — Ambulatory Visit: Payer: 59 | Admitting: Nurse Practitioner

## 2018-04-26 ENCOUNTER — Encounter: Payer: Self-pay | Admitting: Nurse Practitioner

## 2018-04-26 VITALS — BP 114/61 | HR 97 | Ht 68.0 in | Wt 199.6 lb

## 2018-04-26 DIAGNOSIS — G4733 Obstructive sleep apnea (adult) (pediatric): Secondary | ICD-10-CM

## 2018-04-26 DIAGNOSIS — Z9989 Dependence on other enabling machines and devices: Secondary | ICD-10-CM | POA: Diagnosis not present

## 2018-04-26 NOTE — Patient Instructions (Signed)
CPAP compliance 93% Continue same settings F/U yearly and prn

## 2018-04-27 NOTE — Progress Notes (Signed)
Fax confirmation received for [employer], cpap [phone].

## 2018-04-29 NOTE — Progress Notes (Signed)
I agree with the assessment and plan as directed by NP .The patient is known to the stroke service.  Johnmichael Melhorn, MD

## 2018-05-03 DIAGNOSIS — I639 Cerebral infarction, unspecified: Secondary | ICD-10-CM | POA: Diagnosis not present

## 2018-05-03 DIAGNOSIS — N183 Chronic kidney disease, stage 3 (moderate): Secondary | ICD-10-CM | POA: Diagnosis not present

## 2018-05-03 DIAGNOSIS — E1142 Type 2 diabetes mellitus with diabetic polyneuropathy: Secondary | ICD-10-CM | POA: Diagnosis not present

## 2018-05-03 DIAGNOSIS — N5201 Erectile dysfunction due to arterial insufficiency: Secondary | ICD-10-CM | POA: Diagnosis not present

## 2018-05-03 DIAGNOSIS — E785 Hyperlipidemia, unspecified: Secondary | ICD-10-CM | POA: Diagnosis not present

## 2018-05-03 DIAGNOSIS — Z0000 Encounter for general adult medical examination without abnormal findings: Secondary | ICD-10-CM | POA: Diagnosis not present

## 2018-05-03 DIAGNOSIS — I129 Hypertensive chronic kidney disease with stage 1 through stage 4 chronic kidney disease, or unspecified chronic kidney disease: Secondary | ICD-10-CM | POA: Diagnosis not present

## 2018-05-03 DIAGNOSIS — F39 Unspecified mood [affective] disorder: Secondary | ICD-10-CM | POA: Diagnosis not present

## 2018-05-03 DIAGNOSIS — E1121 Type 2 diabetes mellitus with diabetic nephropathy: Secondary | ICD-10-CM | POA: Diagnosis not present

## 2018-05-06 ENCOUNTER — Encounter: Payer: Self-pay | Admitting: Family

## 2018-05-06 ENCOUNTER — Other Ambulatory Visit: Payer: Self-pay

## 2018-05-06 ENCOUNTER — Inpatient Hospital Stay: Payer: 59

## 2018-05-06 ENCOUNTER — Inpatient Hospital Stay: Payer: 59 | Attending: Hematology & Oncology | Admitting: Family

## 2018-05-06 VITALS — BP 146/63 | HR 87 | Temp 98.1°F | Resp 16 | Wt 198.0 lb

## 2018-05-06 DIAGNOSIS — D582 Other hemoglobinopathies: Secondary | ICD-10-CM

## 2018-05-06 DIAGNOSIS — E291 Testicular hypofunction: Secondary | ICD-10-CM

## 2018-05-06 DIAGNOSIS — D509 Iron deficiency anemia, unspecified: Secondary | ICD-10-CM | POA: Diagnosis not present

## 2018-05-06 DIAGNOSIS — D508 Other iron deficiency anemias: Secondary | ICD-10-CM

## 2018-05-06 DIAGNOSIS — K909 Intestinal malabsorption, unspecified: Secondary | ICD-10-CM | POA: Insufficient documentation

## 2018-05-06 DIAGNOSIS — D500 Iron deficiency anemia secondary to blood loss (chronic): Secondary | ICD-10-CM

## 2018-05-06 LAB — IRON AND TIBC
Iron: 77 ug/dL (ref 42–163)
SATURATION RATIOS: 20 % — AB (ref 42–163)
TIBC: 379 ug/dL (ref 202–409)
UIBC: 302 ug/dL

## 2018-05-06 LAB — CBC WITH DIFFERENTIAL (CANCER CENTER ONLY)
BASOS PCT: 1 %
Basophils Absolute: 0 10*3/uL (ref 0.0–0.1)
Eosinophils Absolute: 0.1 10*3/uL (ref 0.0–0.5)
Eosinophils Relative: 1 %
HEMATOCRIT: 44.1 % (ref 38.7–49.9)
HEMOGLOBIN: 14.7 g/dL (ref 13.0–17.1)
Lymphocytes Relative: 17 %
Lymphs Abs: 1 10*3/uL (ref 0.9–3.3)
MCH: 29.5 pg (ref 28.0–33.4)
MCHC: 33.3 g/dL (ref 32.0–35.9)
MCV: 88.6 fL (ref 82.0–98.0)
MONOS PCT: 7 %
Monocytes Absolute: 0.4 10*3/uL (ref 0.1–0.9)
NEUTROS ABS: 4.6 10*3/uL (ref 1.5–6.5)
NEUTROS PCT: 74 %
Platelet Count: 249 10*3/uL (ref 145–400)
RBC: 4.98 MIL/uL (ref 4.20–5.70)
RDW: 13.2 % (ref 11.1–15.7)
WBC Count: 6.1 10*3/uL (ref 4.0–10.0)

## 2018-05-06 LAB — CMP (CANCER CENTER ONLY)
ALBUMIN: 3.9 g/dL (ref 3.5–5.0)
ALT: 22 U/L (ref 0–55)
AST: 19 U/L (ref 5–34)
Alkaline Phosphatase: 100 U/L (ref 40–150)
Anion gap: 10 (ref 3–11)
BUN: 13 mg/dL (ref 7–26)
CHLORIDE: 102 mmol/L (ref 98–109)
CO2: 26 mmol/L (ref 22–29)
CREATININE: 1.22 mg/dL (ref 0.70–1.30)
Calcium: 10 mg/dL (ref 8.4–10.4)
GFR, Est AFR Am: >60 mL/min (ref >60)
GFR, Estimated: >60 mL/min (ref >60)
Glucose, Bld: 137 mg/dL (ref 70–140)
Potassium: 4.6 mmol/L (ref 3.5–5.1)
SODIUM: 138 mmol/L (ref 136–145)
Total Bilirubin: 0.3 mg/dL (ref 0.2–1.2)
Total Protein: 7.1 g/dL (ref 6.4–8.3)

## 2018-05-06 LAB — FERRITIN: FERRITIN: 36 ng/mL (ref 22–316)

## 2018-05-06 NOTE — Progress Notes (Signed)
Hematology and Oncology Follow Up Visit  Adam Quinn [id number] 1955/11/29 62 y.o. 05/06/2018   Principle Diagnosis:  Iron deficiency anemia Erythropoietin deficiency anemia  Current Therapy:   IV iron as indicated   Interim History:  [name] is here today for follow-up. He states that he feels the best he has felt in 6 months. His energy is improved.  He is still off of Testosterone and his HGb and Hct have returned to normal range.  No fever, chills, n/v, cough, rash, dizziness, SOB, chest pain, palpitations, abdominal pain or changes in bowel or bladder habits.  No episodes of bleeding. He does bruise easily on Plavix.  No swelling, tenderness, numbness or tingling in his extremities. No c/o pain.  No lymphadenopathy.  His appetite comes and goes (due to Trulicity) but he is staying well hydrated. His weight is stable.  ECOG Performance Status: 1 - Symptomatic but completely ambulatory  Medications:  Allergies as of 05/06/2018      Reactions   Aspirin Hives   Shrimp [shellfish Allergy] Hives   Just shrimp      Medication List        Accurate as of 05/06/18 10:34 AM. Always use your most recent med list.          ALAVERT PO Take 1 tablet by mouth at bedtime. Alavert D   clopidogrel 75 MG tablet Commonly known as:  PLAVIX Take 1 tablet (75 mg total) by mouth daily.   COQ10 PO Take 200 mg by mouth 2 (two) times daily.   dorzolamide 2 % ophthalmic solution Commonly known as:  TRUSOPT   FREESTYLE LITE test strip Generic drug:  glucose blood   gabapentin 300 MG capsule Commonly known as:  NEURONTIN Take 300 mg by mouth daily.   ibuprofen 800 MG tablet Commonly known as:  ADVIL,MOTRIN Take 800 mg by mouth every 8 (eight) hours as needed.   lisinopril 5 MG tablet Commonly known as:  PRINIVIL,ZESTRIL Take 5 mg by mouth daily.   MELATONIN PO Take 9 mg by mouth at bedtime.   metFORMIN 500 MG 24 hr tablet Commonly known as:  GLUCOPHAGE-XR Take  2,000 mg by mouth at bedtime. Take four tablets at bedtime.   omeprazole 40 MG capsule Commonly known as:  PRILOSEC Take 1 capsule (40 mg total) by mouth 2 (two) times daily.   sertraline 100 MG tablet Commonly known as:  ZOLOFT daily.   simvastatin 20 MG tablet Commonly known as:  ZOCOR Take 20 mg by mouth every evening.   sodium bicarbonate 650 MG tablet Take 1 tablet (650 mg total) by mouth 2 (two) times daily.   TESTOSTERONE IM Inject into the muscle. Every two weeks   TOUJEO SOLOSTAR 300 UNIT/ML Sopn Generic drug:  Insulin Glargine Inject 44 Units into the skin at bedtime.   TRAVATAN Z 0.004 % Soln ophthalmic solution Generic drug:  Travoprost (BAK Free) Place 1 drop into both eyes at bedtime.   TRULICITY 7.82 NF/6.2ZH Sopn Generic drug:  Dulaglutide Inject into the skin.       Allergies:  Allergies  Allergen Reactions  . Aspirin Hives  . Shrimp [Shellfish Allergy] Hives    Just shrimp     Past Medical History, Surgical history, Social history, and Family History were reviewed and updated.  Review of Systems: All other 10 point review of systems is negative.   Physical Exam:  weight is 198 lb (89.8 kg). His oral temperature is 98.1 F (36.7 C). His blood  pressure is 146/63 (abnormal) and his pulse is 87. His respiration is 16 and oxygen saturation is 99%.   Wt Readings from Last 3 Encounters:  05/06/18 198 lb (89.8 kg)  04/26/18 199 lb 9.6 oz (90.5 kg)  04/05/18 198 lb (89.8 kg)    Ocular: Sclerae unicteric, pupils equal, round and reactive to light Ear-nose-throat: Oropharynx clear, dentition fair Lymphatic: No cervical, supraclavicular or axillary adenopathy Lungs no rales or rhonchi, good excursion bilaterally Heart regular rate and rhythm, no murmur appreciated Abd soft, nontender, positive bowel sounds, no liver or spleen tip palpated on exam, no fluid wave  MSK no focal spinal tenderness, no joint edema Neuro: non-focal, well-oriented,  appropriate affect Breasts: Deferred   Lab Results  Component Value Date   WBC 6.1 05/06/2018   HGB 14.7 05/06/2018   HCT 44.1 05/06/2018   MCV 88.6 05/06/2018   PLT 249 05/06/2018   Lab Results  Component Value Date   FERRITIN 40 04/05/2018   IRON 73 04/05/2018   TIBC 390 04/05/2018   UIBC 317 04/05/2018   IRONPCTSAT 19 (L) 04/05/2018   Lab Results  Component Value Date   RETICCTPCT 1.3 04/05/2018   RBC 4.98 05/06/2018   No results found for: KPAFRELGTCHN, LAMBDASER, KAPLAMBRATIO No results found for: IGGSERUM, IGA, IGMSERUM No results found for: Odetta Pink, SPEI   Chemistry      Component Value Date/Time   NA 140 05/26/2017 0856   K 4.2 05/26/2017 0856   CO2 25 05/26/2017 0856   BUN 12.7 05/26/2017 0856   CREATININE 1.2 05/26/2017 0856      Component Value Date/Time   CALCIUM 9.5 05/26/2017 0856   ALKPHOS 83 05/26/2017 0856   AST 16 05/26/2017 0856   ALT 22 05/26/2017 0856   BILITOT 0.28 05/26/2017 0856      Impression and Plan: [name] is a very pleasant 62 yo caucasian gentleman with iron deficiency anemia secondary to malabsorption. He is doing well and has no complaints at this time.  We will see what his testosterone and iron studies show and treat as needed.  We will go ahead and plan to see him back in another 6 weeks for follow-up.  He promises to contact our office with any questions or concerns. We can certainly see her sooner if need be.   Laverna Peace, NP 6/13/201910:34 AM

## 2018-05-07 LAB — TESTOSTERONE: Testosterone: 130 ng/dL — ABNORMAL LOW (ref 264–916)

## 2018-05-10 ENCOUNTER — Telehealth: Payer: Self-pay | Admitting: Family

## 2018-05-10 NOTE — Telephone Encounter (Signed)
Spoke with Adam Quinn and let him know his testosterone level. Patient is asymptomatic with this at this time so we will hold off on restarting his testosterone injections. He will let us know if he becomes symptomatic. Patient in agreement with the plan.

## 2018-05-12 ENCOUNTER — Ambulatory Visit: Payer: 59

## 2018-05-17 ENCOUNTER — Inpatient Hospital Stay: Payer: 59

## 2018-05-17 VITALS — BP 140/78 | HR 90 | Temp 97.9°F | Resp 20

## 2018-05-17 DIAGNOSIS — D500 Iron deficiency anemia secondary to blood loss (chronic): Secondary | ICD-10-CM

## 2018-05-17 DIAGNOSIS — D509 Iron deficiency anemia, unspecified: Secondary | ICD-10-CM | POA: Diagnosis not present

## 2018-05-17 DIAGNOSIS — K909 Intestinal malabsorption, unspecified: Secondary | ICD-10-CM | POA: Diagnosis not present

## 2018-05-17 MED ORDER — SODIUM CHLORIDE 0.9 % IV SOLN
INTRAVENOUS | Status: DC
  Administered 2018-05-17: 15:00:00 via INTRAVENOUS

## 2018-05-17 MED ORDER — FERUMOXYTOL INJECTION 510 MG/17 ML
510.0000 mg | Freq: Once | INTRAVENOUS | Status: AC
  Administered 2018-05-17: 510 mg via INTRAVENOUS
  Filled 2018-05-17: qty 17

## 2018-05-24 DIAGNOSIS — G4733 Obstructive sleep apnea (adult) (pediatric): Secondary | ICD-10-CM | POA: Diagnosis not present

## 2018-05-31 DIAGNOSIS — E1121 Type 2 diabetes mellitus with diabetic nephropathy: Secondary | ICD-10-CM | POA: Diagnosis not present

## 2018-05-31 DIAGNOSIS — Z125 Encounter for screening for malignant neoplasm of prostate: Secondary | ICD-10-CM | POA: Diagnosis not present

## 2018-06-17 ENCOUNTER — Encounter: Payer: Self-pay | Admitting: Family

## 2018-06-17 ENCOUNTER — Inpatient Hospital Stay: Payer: 59 | Attending: Hematology & Oncology | Admitting: Family

## 2018-06-17 ENCOUNTER — Inpatient Hospital Stay: Payer: 59

## 2018-06-17 ENCOUNTER — Other Ambulatory Visit: Payer: Self-pay

## 2018-06-17 VITALS — BP 148/80 | HR 94 | Temp 98.3°F | Resp 18 | Wt 200.0 lb

## 2018-06-17 DIAGNOSIS — R42 Dizziness and giddiness: Secondary | ICD-10-CM | POA: Insufficient documentation

## 2018-06-17 DIAGNOSIS — K909 Intestinal malabsorption, unspecified: Secondary | ICD-10-CM | POA: Insufficient documentation

## 2018-06-17 DIAGNOSIS — R5383 Other fatigue: Secondary | ICD-10-CM | POA: Insufficient documentation

## 2018-06-17 DIAGNOSIS — D500 Iron deficiency anemia secondary to blood loss (chronic): Secondary | ICD-10-CM

## 2018-06-17 DIAGNOSIS — D509 Iron deficiency anemia, unspecified: Secondary | ICD-10-CM | POA: Diagnosis not present

## 2018-06-17 DIAGNOSIS — D582 Other hemoglobinopathies: Secondary | ICD-10-CM

## 2018-06-17 DIAGNOSIS — E291 Testicular hypofunction: Secondary | ICD-10-CM

## 2018-06-17 LAB — CMP (CANCER CENTER ONLY)
ALK PHOS: 94 U/L (ref 38–126)
ALT: 39 U/L (ref 0–44)
AST: 24 U/L (ref 15–41)
Albumin: 4 g/dL (ref 3.5–5.0)
Anion gap: 11 (ref 5–15)
BUN: 13 mg/dL (ref 8–23)
CO2: 26 mmol/L (ref 22–32)
CREATININE: 1.27 mg/dL — AB (ref 0.61–1.24)
Calcium: 9.7 mg/dL (ref 8.9–10.3)
Chloride: 101 mmol/L (ref 98–111)
GFR, Estimated: 58 mL/min — ABNORMAL LOW (ref >60)
GLUCOSE: 159 mg/dL — AB (ref 70–99)
Potassium: 4.7 mmol/L (ref 3.5–5.1)
SODIUM: 138 mmol/L (ref 135–145)
TOTAL PROTEIN: 7.2 g/dL (ref 6.5–8.1)
Total Bilirubin: 0.3 mg/dL (ref 0.3–1.2)

## 2018-06-17 LAB — CBC WITH DIFFERENTIAL (CANCER CENTER ONLY)
Basophils Absolute: 0 10*3/uL (ref 0.0–0.1)
Basophils Relative: 0 %
Eosinophils Absolute: 0.1 10*3/uL (ref 0.0–0.5)
Eosinophils Relative: 2 %
HCT: 44.6 % (ref 38.7–49.9)
HEMOGLOBIN: 15.1 g/dL (ref 13.0–17.1)
LYMPHS ABS: 1.3 10*3/uL (ref 0.9–3.3)
LYMPHS PCT: 24 %
MCH: 30 pg (ref 28.0–33.4)
MCHC: 33.9 g/dL (ref 32.0–35.9)
MCV: 88.5 fL (ref 82.0–98.0)
Monocytes Absolute: 0.5 10*3/uL (ref 0.1–0.9)
Monocytes Relative: 9 %
NEUTROS PCT: 65 %
Neutro Abs: 3.6 10*3/uL (ref 1.5–6.5)
Platelet Count: 223 10*3/uL (ref 145–400)
RBC: 5.04 MIL/uL (ref 4.20–5.70)
RDW: 14.5 % (ref 11.1–15.7)
WBC: 5.5 10*3/uL (ref 4.0–10.0)

## 2018-06-17 NOTE — Progress Notes (Signed)
Hematology and Oncology Follow Up Visit  Adam Quinn [id number] [birth year] 62 y.o. 06/17/2018   Principle Diagnosis:  Iron deficiency anemia Erythropoietin deficiency anemia  Current Therapy:   IV iron as indicated   Interim History:  Adam Quinn is here today for follow-up. He is doing well and has no complaints at this time. He has had some mild intermittent fatigue and a couple episodes of dizziness.  Hct is 44.6 so no phlebotomy needed.  No episodes of bleeding on plavix. His skin is thin and he does bruise easily.  No fever, chills, n/v, cough, rash, SOB, chest pain, palpitations, abdominal pain or changes in bowel or bladder habits.  No falls or syncopal episodes.  No swelling or tenderness in his extremities. The numbness and tingling in his hands and feet is unchanged.  He has a good appetite and is staying well hydrated. His weight is stable.  Lymphadenopathy noted on exam.  His blood sugars are fairly well controlled. Last Hb A1c was 7.8.   ECOG Performance Status: 1 - Symptomatic but completely ambulatory  Medications:  Allergies as of 06/17/2018      Reactions   Aspirin Hives   Shrimp [shellfish Allergy] Hives   Just shrimp      Medication List        Accurate as of 06/17/18 11:09 AM. Always use your most recent med list.          ALAVERT PO Take 1 tablet by mouth at bedtime. Alavert D   clopidogrel 75 MG tablet Commonly known as:  PLAVIX Take 1 tablet (75 mg total) by mouth daily.   COQ10 PO Take 200 mg by mouth 2 (two) times daily.   dorzolamide 2 % ophthalmic solution Commonly known as:  TRUSOPT   FREESTYLE LITE test strip Generic drug:  glucose blood   gabapentin 300 MG capsule Commonly known as:  NEURONTIN Take 300 mg by mouth daily.   ibuprofen 800 MG tablet Commonly known as:  ADVIL,MOTRIN Take 800 mg by mouth every 8 (eight) hours as needed.   lisinopril 5 MG tablet Commonly known as:  PRINIVIL,ZESTRIL Take 5 mg by mouth  daily.   MELATONIN PO Take 9 mg by mouth at bedtime.   metFORMIN 500 MG 24 hr tablet Commonly known as:  GLUCOPHAGE-XR Take 2,000 mg by mouth at bedtime. Take four tablets at bedtime.   omeprazole 40 MG capsule Commonly known as:  PRILOSEC Take 1 capsule (40 mg total) by mouth 2 (two) times daily.   sertraline 100 MG tablet Commonly known as:  ZOLOFT daily.   simvastatin 20 MG tablet Commonly known as:  ZOCOR Take 20 mg by mouth every evening.   sodium bicarbonate 650 MG tablet Take 1 tablet (650 mg total) by mouth 2 (two) times daily.   TESTOSTERONE IM Inject into the muscle. Every two weeks   TOUJEO SOLOSTAR 300 UNIT/ML Sopn Generic drug:  Insulin Glargine Inject 44 Units into the skin at bedtime.   TRAVATAN Z 0.004 % Soln ophthalmic solution Generic drug:  Travoprost (BAK Free) Place 1 drop into both eyes at bedtime.   TRULICITY 7.82 NF/6.2ZH Sopn Generic drug:  Dulaglutide Inject into the skin.       Allergies:  Allergies  Allergen Reactions  . Aspirin Hives  . Shrimp [Shellfish Allergy] Hives    Just shrimp     Past Medical History, Surgical history, Social history, and Family History were reviewed and updated.  Review of Systems: All other 10 point  review of systems is negative.   Physical Exam:  vitals were not taken for this visit.   Wt Readings from Last 3 Encounters:  05/06/18 198 lb (89.8 kg)  04/26/18 199 lb 9.6 oz (90.5 kg)  04/05/18 198 lb (89.8 kg)    Ocular: Sclerae unicteric, pupils equal, round and reactive to light Ear-nose-throat: Oropharynx clear, dentition fair Lymphatic: No cervical, supraclavicular or axillary adenopathy Lungs no rales or rhonchi, good excursion bilaterally Heart regular rate and rhythm, no murmur appreciated Abd soft, nontender, positive bowel sounds, no liver or spleen tip palpated on exam, no fluid wave  MSK no focal spinal tenderness, no joint edema Neuro: non-focal, well-oriented, appropriate  affect Breasts: Deferred   Lab Results  Component Value Date   WBC 5.5 06/17/2018   HGB 15.1 06/17/2018   HCT 44.6 06/17/2018   MCV 88.5 06/17/2018   PLT 223 06/17/2018   Lab Results  Component Value Date   FERRITIN 36 05/06/2018   IRON 77 05/06/2018   TIBC 379 05/06/2018   UIBC 302 05/06/2018   IRONPCTSAT 20 (L) 05/06/2018   Lab Results  Component Value Date   RETICCTPCT 1.3 04/05/2018   RBC 5.04 06/17/2018   No results found for: KPAFRELGTCHN, LAMBDASER, KAPLAMBRATIO No results found for: IGGSERUM, IGA, IGMSERUM No results found for: Odetta Pink, SPEI   Chemistry      Component Value Date/Time   NA 138 05/06/2018 0907   NA 140 05/26/2017 0856   K 4.6 05/06/2018 0907   K 4.2 05/26/2017 0856   CL 102 05/06/2018 0907   CO2 26 05/06/2018 0907   CO2 25 05/26/2017 0856   BUN 13 05/06/2018 0907   BUN 12.7 05/26/2017 0856   CREATININE 1.22 05/06/2018 0907   CREATININE 1.2 05/26/2017 0856      Component Value Date/Time   CALCIUM 10.0 05/06/2018 0907   CALCIUM 9.5 05/26/2017 0856   ALKPHOS 100 05/06/2018 0907   ALKPHOS 83 05/26/2017 0856   AST 19 05/06/2018 0907   AST 16 05/26/2017 0856   ALT 22 05/06/2018 0907   ALT 22 05/26/2017 0856   BILITOT 0.3 05/06/2018 0907   BILITOT 0.28 05/26/2017 0856      Impression and Plan: Adam Quinn is a very pleasant 62 yo caucasian gentleman with iron deficiency anemia secondary to malabsorption. He is has some mild fatigue at times and has had a few episodes of dizziness.  Hct is 44.6% so no phlebotomy needed at this time.  We will see what his iron studies show and bring him back in for infusion if needed.  We will plan to see him back in another 3 months for follow-up.  He will contact our office with any questions or concerns. We can certainly see him sooner if need be.   Laverna Peace, NP 7/25/201911:09 AM

## 2018-06-18 LAB — IRON AND TIBC
Iron: 85 ug/dL (ref 42–163)
SATURATION RATIOS: 27 % — AB (ref 42–163)
TIBC: 316 ug/dL (ref 202–409)
UIBC: 231 ug/dL

## 2018-06-18 LAB — FERRITIN: FERRITIN: 159 ng/mL (ref 24–336)

## 2018-06-18 LAB — TESTOSTERONE: Testosterone: 164 ng/dL — ABNORMAL LOW (ref 264–916)

## 2018-06-28 DIAGNOSIS — H35033 Hypertensive retinopathy, bilateral: Secondary | ICD-10-CM | POA: Diagnosis not present

## 2018-06-28 DIAGNOSIS — H26491 Other secondary cataract, right eye: Secondary | ICD-10-CM | POA: Diagnosis not present

## 2018-06-28 DIAGNOSIS — H401132 Primary open-angle glaucoma, bilateral, moderate stage: Secondary | ICD-10-CM | POA: Diagnosis not present

## 2018-06-28 DIAGNOSIS — E113293 Type 2 diabetes mellitus with mild nonproliferative diabetic retinopathy without macular edema, bilateral: Secondary | ICD-10-CM | POA: Diagnosis not present

## 2018-06-28 DIAGNOSIS — H40053 Ocular hypertension, bilateral: Secondary | ICD-10-CM | POA: Diagnosis not present

## 2018-07-12 DIAGNOSIS — H35033 Hypertensive retinopathy, bilateral: Secondary | ICD-10-CM | POA: Diagnosis not present

## 2018-07-12 DIAGNOSIS — H401132 Primary open-angle glaucoma, bilateral, moderate stage: Secondary | ICD-10-CM | POA: Diagnosis not present

## 2018-07-12 DIAGNOSIS — H40053 Ocular hypertension, bilateral: Secondary | ICD-10-CM | POA: Diagnosis not present

## 2018-07-12 DIAGNOSIS — E113293 Type 2 diabetes mellitus with mild nonproliferative diabetic retinopathy without macular edema, bilateral: Secondary | ICD-10-CM | POA: Diagnosis not present

## 2018-07-14 DIAGNOSIS — H40053 Ocular hypertension, bilateral: Secondary | ICD-10-CM | POA: Diagnosis not present

## 2018-07-14 DIAGNOSIS — H401132 Primary open-angle glaucoma, bilateral, moderate stage: Secondary | ICD-10-CM | POA: Diagnosis not present

## 2018-07-14 DIAGNOSIS — H1859 Other hereditary corneal dystrophies: Secondary | ICD-10-CM | POA: Diagnosis not present

## 2018-07-14 DIAGNOSIS — H53419 Scotoma involving central area, unspecified eye: Secondary | ICD-10-CM | POA: Diagnosis not present

## 2018-09-01 DIAGNOSIS — H40053 Ocular hypertension, bilateral: Secondary | ICD-10-CM | POA: Diagnosis not present

## 2018-09-01 DIAGNOSIS — H53419 Scotoma involving central area, unspecified eye: Secondary | ICD-10-CM | POA: Diagnosis not present

## 2018-09-01 DIAGNOSIS — H1859 Other hereditary corneal dystrophies: Secondary | ICD-10-CM | POA: Diagnosis not present

## 2018-09-01 DIAGNOSIS — H401132 Primary open-angle glaucoma, bilateral, moderate stage: Secondary | ICD-10-CM | POA: Diagnosis not present

## 2018-09-20 ENCOUNTER — Inpatient Hospital Stay: Payer: 59

## 2018-09-20 ENCOUNTER — Inpatient Hospital Stay: Payer: 59 | Attending: Family | Admitting: Family

## 2018-09-20 VITALS — BP 145/71 | HR 78 | Temp 97.8°F | Resp 18 | Wt 199.4 lb

## 2018-09-20 DIAGNOSIS — K909 Intestinal malabsorption, unspecified: Secondary | ICD-10-CM | POA: Insufficient documentation

## 2018-09-20 DIAGNOSIS — D582 Other hemoglobinopathies: Secondary | ICD-10-CM

## 2018-09-20 DIAGNOSIS — Z79899 Other long term (current) drug therapy: Secondary | ICD-10-CM | POA: Insufficient documentation

## 2018-09-20 DIAGNOSIS — E291 Testicular hypofunction: Secondary | ICD-10-CM

## 2018-09-20 DIAGNOSIS — D509 Iron deficiency anemia, unspecified: Secondary | ICD-10-CM | POA: Diagnosis not present

## 2018-09-20 DIAGNOSIS — D500 Iron deficiency anemia secondary to blood loss (chronic): Secondary | ICD-10-CM

## 2018-09-20 LAB — CBC WITH DIFFERENTIAL (CANCER CENTER ONLY)
Abs Immature Granulocytes: 0.01 10*3/uL (ref 0.00–0.07)
Basophils Absolute: 0 10*3/uL (ref 0.0–0.1)
Basophils Relative: 0 %
EOS ABS: 0.1 10*3/uL (ref 0.0–0.5)
EOS PCT: 1 %
HEMATOCRIT: 42.7 % (ref 39.0–52.0)
Hemoglobin: 14 g/dL (ref 13.0–17.0)
IMMATURE GRANULOCYTES: 0 %
LYMPHS ABS: 1 10*3/uL (ref 0.7–4.0)
Lymphocytes Relative: 19 %
MCH: 30.4 pg (ref 26.0–34.0)
MCHC: 32.8 g/dL (ref 30.0–36.0)
MCV: 92.6 fL (ref 80.0–100.0)
MONOS PCT: 8 %
Monocytes Absolute: 0.4 10*3/uL (ref 0.1–1.0)
Neutro Abs: 3.7 10*3/uL (ref 1.7–7.7)
Neutrophils Relative %: 72 %
Platelet Count: 218 10*3/uL (ref 150–400)
RBC: 4.61 MIL/uL (ref 4.22–5.81)
RDW: 13.3 % (ref 11.5–15.5)
WBC: 5.1 10*3/uL (ref 4.0–10.5)
nRBC: 0 % (ref 0.0–0.2)

## 2018-09-20 LAB — CMP (CANCER CENTER ONLY)
ALK PHOS: 73 U/L (ref 26–84)
ALT: 38 U/L (ref 10–47)
ANION GAP: 2 — AB (ref 5–15)
AST: 30 U/L (ref 11–38)
Albumin: 3.6 g/dL (ref 3.5–5.0)
BILIRUBIN TOTAL: 0.5 mg/dL (ref 0.2–1.6)
BUN: 11 mg/dL (ref 7–22)
CALCIUM: 9.2 mg/dL (ref 8.0–10.3)
CO2: 28 mmol/L (ref 18–33)
Chloride: 109 mmol/L — ABNORMAL HIGH (ref 98–108)
Creatinine: 1.2 mg/dL (ref 0.60–1.20)
Glucose, Bld: 194 mg/dL — ABNORMAL HIGH (ref 73–118)
Potassium: 4.6 mmol/L (ref 3.3–4.7)
Sodium: 139 mmol/L (ref 128–145)
Total Protein: 6.7 g/dL (ref 6.4–8.1)

## 2018-09-20 LAB — IRON AND TIBC
IRON: 70 ug/dL (ref 42–163)
Saturation Ratios: 22 % — ABNORMAL LOW (ref 42–163)
TIBC: 320 ug/dL (ref 202–409)
UIBC: 250 ug/dL

## 2018-09-20 LAB — FERRITIN: Ferritin: 87 ng/mL (ref 24–336)

## 2018-09-20 NOTE — Progress Notes (Signed)
Hematology and Oncology Follow Up Visit  Adam Quinn [id number] [birth year] 62 y.o. 09/20/2018   Principle Diagnosis:  Iron deficiency anemia Erythropoietin deficiency anemia  Current Therapy:   IV iron as indicated    Interim History: Adam Quinn is here today for follow-up. He has had some fatigue and lower back and hip pain since moving to a new apartment and then moving his mother in law into their apartment last week. They are still unpacking boxes.  He has had no weakness, swelling, tenderness, numbness or tingling in his extremities.  No episodes of bleeding, no petechiae. He is on Plavix and does bruise.  No fever, chills, n/v, cough, rash, SOB, chest pain, palpitations, abdominal pain or changes in bowel or bladder habits.  He has had some dizziness and issue with balance including a fall. No neurological deficits on exam today. I recommended he follow-up with his PCP. He states that he has had inner ear issues in the past with dizziness.  He has a good appetite and is staying well hydrated. His weight is stable.   ECOG Performance Status: 1 - Symptomatic but completely ambulatory  Medications:  Allergies as of 09/20/2018      Reactions   Aspirin Hives   Shrimp [shellfish Allergy] Hives   Just shrimp      Medication List        Accurate as of 09/20/18  9:42 AM. Always use your most recent med list.          ALAVERT PO Take 1 tablet by mouth at bedtime. Alavert D   clopidogrel 75 MG tablet Commonly known as:  PLAVIX Take 1 tablet (75 mg total) by mouth daily.   COQ10 PO Take 200 mg by mouth 2 (two) times daily.   dorzolamide 2 % ophthalmic solution Commonly known as:  TRUSOPT   FREESTYLE LITE test strip Generic drug:  glucose blood   gabapentin 300 MG capsule Commonly known as:  NEURONTIN Take 300 mg by mouth daily.   ibuprofen 800 MG tablet Commonly known as:  ADVIL,MOTRIN Take 800 mg by mouth every 8 (eight) hours as needed.   lansoprazole  30 MG capsule Commonly known as:  PREVACID Take 1 capsule by mouth 2 (two) times daily.   lisinopril 5 MG tablet Commonly known as:  PRINIVIL,ZESTRIL Take 5 mg by mouth daily.   MELATONIN PO Take 9 mg by mouth at bedtime.   metFORMIN 500 MG 24 hr tablet Commonly known as:  GLUCOPHAGE-XR Take 2,000 mg by mouth at bedtime. Take four tablets at bedtime.   omeprazole 40 MG capsule Commonly known as:  PRILOSEC Take 1 capsule (40 mg total) by mouth 2 (two) times daily.   sertraline 100 MG tablet Commonly known as:  ZOLOFT daily.   simvastatin 20 MG tablet Commonly known as:  ZOCOR Take 20 mg by mouth every evening.   sodium bicarbonate 650 MG tablet Take 1 tablet (650 mg total) by mouth 2 (two) times daily.   TESTOSTERONE IM Inject into the muscle. Every two weeks   TOUJEO SOLOSTAR 300 UNIT/ML Sopn Generic drug:  Insulin Glargine Inject 44 Units into the skin at bedtime.   TRAVATAN Z 0.004 % Soln ophthalmic solution Generic drug:  Travoprost (BAK Free) Place 1 drop into both eyes at bedtime.   TRULICITY 0.35 KK/9.3GH Sopn Generic drug:  Dulaglutide Inject into the skin.       Allergies:  Allergies  Allergen Reactions  . Aspirin Hives  . Shrimp [Shellfish Allergy]  Hives    Just shrimp     Past Medical History, Surgical history, Social history, and Family History were reviewed and updated.  Review of Systems: All other 10 point review of systems is negative.   Physical Exam:  weight is 199 lb 6.4 oz (90.4 kg). His oral temperature is 97.8 F (36.6 C). His blood pressure is 145/71 (abnormal) and his pulse is 78. His respiration is 18 and oxygen saturation is 98%.   Wt Readings from Last 3 Encounters:  09/20/18 199 lb 6.4 oz (90.4 kg)  06/17/18 200 lb (90.7 kg)  05/06/18 198 lb (89.8 kg)    Ocular: Sclerae unicteric, pupils equal, round and reactive to light Ear-nose-throat: Oropharynx clear, dentition fair Lymphatic: No cervical, supraclavicular or  axillary adenopathy Lungs no rales or rhonchi, good excursion bilaterally Heart regular rate and rhythm, no murmur appreciated Abd soft, nontender, positive bowel sounds, no liver or spleen tip palpated on exam, no fluid wave  MSK no focal spinal tenderness, no joint edema Neuro: non-focal, well-oriented, appropriate affect Breasts: Deferred   Lab Results  Component Value Date   WBC 5.1 09/20/2018   HGB 14.0 09/20/2018   HCT 42.7 09/20/2018   MCV 92.6 09/20/2018   PLT 218 09/20/2018   Lab Results  Component Value Date   FERRITIN 159 06/17/2018   IRON 85 06/17/2018   TIBC 316 06/17/2018   UIBC 231 06/17/2018   IRONPCTSAT 27 (L) 06/17/2018   Lab Results  Component Value Date   RETICCTPCT 1.3 04/05/2018   RBC 4.61 09/20/2018   No results found for: KPAFRELGTCHN, LAMBDASER, KAPLAMBRATIO No results found for: IGGSERUM, IGA, IGMSERUM No results found for: Odetta Pink, SPEI   Chemistry      Component Value Date/Time   NA 138 06/17/2018 1058   NA 140 05/26/2017 0856   K 4.7 06/17/2018 1058   K 4.2 05/26/2017 0856   CL 101 06/17/2018 1058   CO2 26 06/17/2018 1058   CO2 25 05/26/2017 0856   BUN 13 06/17/2018 1058   BUN 12.7 05/26/2017 0856   CREATININE 1.27 (H) 06/17/2018 1058   CREATININE 1.2 05/26/2017 0856      Component Value Date/Time   CALCIUM 9.7 06/17/2018 1058   CALCIUM 9.5 05/26/2017 0856   ALKPHOS 94 06/17/2018 1058   ALKPHOS 83 05/26/2017 0856   AST 24 06/17/2018 1058   AST 16 05/26/2017 0856   ALT 39 06/17/2018 1058   ALT 22 05/26/2017 0856   BILITOT 0.3 06/17/2018 1058   BILITOT 0.28 05/26/2017 0856       Impression and Plan: Adam Quinn is a very pleasant 62 yo caucasian gentleman with 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 He has some fatigue at times.  We will see what his iron show and bring him back in for infusion if needed.  We will go ahead and plan to see him back in  another 3 months for follow-up.  He will contact our office with any questions or concerns. We can certainly see her sooner if need be.    Laverna Peace, NP 10/28/20199:42 AM

## 2018-09-21 LAB — TESTOSTERONE: Testosterone: 160 ng/dL — ABNORMAL LOW (ref 264–916)

## 2018-09-27 ENCOUNTER — Inpatient Hospital Stay: Payer: 59 | Attending: Hematology & Oncology

## 2018-09-27 VITALS — BP 125/68 | HR 74 | Temp 97.9°F | Resp 17

## 2018-09-27 DIAGNOSIS — D509 Iron deficiency anemia, unspecified: Secondary | ICD-10-CM | POA: Diagnosis not present

## 2018-09-27 DIAGNOSIS — H401112 Primary open-angle glaucoma, right eye, moderate stage: Secondary | ICD-10-CM | POA: Diagnosis not present

## 2018-09-27 DIAGNOSIS — K909 Intestinal malabsorption, unspecified: Secondary | ICD-10-CM | POA: Diagnosis not present

## 2018-09-27 DIAGNOSIS — D500 Iron deficiency anemia secondary to blood loss (chronic): Secondary | ICD-10-CM

## 2018-09-27 DIAGNOSIS — H40051 Ocular hypertension, right eye: Secondary | ICD-10-CM | POA: Diagnosis not present

## 2018-09-27 DIAGNOSIS — D582 Other hemoglobinopathies: Secondary | ICD-10-CM

## 2018-09-27 MED ORDER — SODIUM CHLORIDE 0.9 % IV SOLN
510.0000 mg | Freq: Once | INTRAVENOUS | Status: AC
  Administered 2018-09-27: 510 mg via INTRAVENOUS
  Filled 2018-09-27: qty 17

## 2018-09-27 MED ORDER — SODIUM CHLORIDE 0.9 % IV SOLN
INTRAVENOUS | Status: DC
  Administered 2018-09-27: 14:00:00 via INTRAVENOUS
  Filled 2018-09-27: qty 250

## 2018-10-11 DIAGNOSIS — H401122 Primary open-angle glaucoma, left eye, moderate stage: Secondary | ICD-10-CM | POA: Diagnosis not present

## 2018-11-01 DIAGNOSIS — I129 Hypertensive chronic kidney disease with stage 1 through stage 4 chronic kidney disease, or unspecified chronic kidney disease: Secondary | ICD-10-CM | POA: Diagnosis not present

## 2018-11-01 DIAGNOSIS — E291 Testicular hypofunction: Secondary | ICD-10-CM | POA: Diagnosis not present

## 2018-11-01 DIAGNOSIS — I639 Cerebral infarction, unspecified: Secondary | ICD-10-CM | POA: Diagnosis not present

## 2018-11-01 DIAGNOSIS — Z1159 Encounter for screening for other viral diseases: Secondary | ICD-10-CM | POA: Diagnosis not present

## 2018-11-01 DIAGNOSIS — E1142 Type 2 diabetes mellitus with diabetic polyneuropathy: Secondary | ICD-10-CM | POA: Diagnosis not present

## 2018-11-01 DIAGNOSIS — N183 Chronic kidney disease, stage 3 (moderate): Secondary | ICD-10-CM | POA: Diagnosis not present

## 2018-11-01 DIAGNOSIS — E1121 Type 2 diabetes mellitus with diabetic nephropathy: Secondary | ICD-10-CM | POA: Diagnosis not present

## 2018-11-01 DIAGNOSIS — D509 Iron deficiency anemia, unspecified: Secondary | ICD-10-CM | POA: Diagnosis not present

## 2018-11-01 DIAGNOSIS — Z23 Encounter for immunization: Secondary | ICD-10-CM | POA: Diagnosis not present

## 2018-11-22 ENCOUNTER — Encounter: Payer: Self-pay | Admitting: Nurse Practitioner

## 2018-12-20 ENCOUNTER — Other Ambulatory Visit: Payer: 59

## 2018-12-20 ENCOUNTER — Ambulatory Visit: Payer: 59 | Admitting: Family

## 2018-12-22 ENCOUNTER — Inpatient Hospital Stay: Payer: 59 | Admitting: Family

## 2018-12-22 ENCOUNTER — Inpatient Hospital Stay: Payer: 59

## 2018-12-24 ENCOUNTER — Encounter: Payer: Self-pay | Admitting: Family

## 2018-12-24 ENCOUNTER — Telehealth: Payer: Self-pay | Admitting: Family

## 2018-12-24 ENCOUNTER — Other Ambulatory Visit: Payer: Self-pay

## 2018-12-24 ENCOUNTER — Inpatient Hospital Stay (HOSPITAL_BASED_OUTPATIENT_CLINIC_OR_DEPARTMENT_OTHER): Payer: 59 | Admitting: Family

## 2018-12-24 ENCOUNTER — Inpatient Hospital Stay: Payer: 59 | Attending: Hematology & Oncology

## 2018-12-24 VITALS — BP 129/71 | HR 100 | Temp 98.0°F | Resp 18 | Ht 68.0 in | Wt 196.8 lb

## 2018-12-24 DIAGNOSIS — R5383 Other fatigue: Secondary | ICD-10-CM | POA: Insufficient documentation

## 2018-12-24 DIAGNOSIS — R42 Dizziness and giddiness: Secondary | ICD-10-CM

## 2018-12-24 DIAGNOSIS — E291 Testicular hypofunction: Secondary | ICD-10-CM

## 2018-12-24 DIAGNOSIS — D509 Iron deficiency anemia, unspecified: Secondary | ICD-10-CM | POA: Diagnosis not present

## 2018-12-24 DIAGNOSIS — D500 Iron deficiency anemia secondary to blood loss (chronic): Secondary | ICD-10-CM

## 2018-12-24 DIAGNOSIS — K909 Intestinal malabsorption, unspecified: Secondary | ICD-10-CM

## 2018-12-24 LAB — CMP (CANCER CENTER ONLY)
ALK PHOS: 87 U/L (ref 38–126)
ALT: 29 U/L (ref 0–44)
ANION GAP: 10 (ref 5–15)
AST: 15 U/L (ref 15–41)
Albumin: 4.4 g/dL (ref 3.5–5.0)
BILIRUBIN TOTAL: 0.3 mg/dL (ref 0.3–1.2)
BUN: 13 mg/dL (ref 8–23)
CALCIUM: 9.6 mg/dL (ref 8.9–10.3)
CO2: 26 mmol/L (ref 22–32)
Chloride: 101 mmol/L (ref 98–111)
Creatinine: 1.14 mg/dL (ref 0.61–1.24)
GFR, Est AFR Am: >60 mL/min (ref >60)
GFR, Estimated: >60 mL/min (ref >60)
GLUCOSE: 270 mg/dL — AB (ref 70–99)
Potassium: 4.1 mmol/L (ref 3.5–5.1)
Sodium: 137 mmol/L (ref 135–145)
TOTAL PROTEIN: 6.6 g/dL (ref 6.5–8.1)

## 2018-12-24 LAB — CBC WITH DIFFERENTIAL (CANCER CENTER ONLY)
Abs Immature Granulocytes: 0.03 10*3/uL (ref 0.00–0.07)
Basophils Absolute: 0 10*3/uL (ref 0.0–0.1)
Basophils Relative: 1 %
EOS PCT: 1 %
Eosinophils Absolute: 0.1 10*3/uL (ref 0.0–0.5)
HEMATOCRIT: 31.9 % — AB (ref 39.0–52.0)
HEMOGLOBIN: 10.7 g/dL — AB (ref 13.0–17.0)
Immature Granulocytes: 0 %
LYMPHS ABS: 1.6 10*3/uL (ref 0.7–4.0)
LYMPHS PCT: 23 %
MCH: 32.1 pg (ref 26.0–34.0)
MCHC: 33.5 g/dL (ref 30.0–36.0)
MCV: 95.8 fL (ref 80.0–100.0)
MONOS PCT: 9 %
Monocytes Absolute: 0.6 10*3/uL (ref 0.1–1.0)
Neutro Abs: 4.5 10*3/uL (ref 1.7–7.7)
Neutrophils Relative %: 66 %
Platelet Count: 262 10*3/uL (ref 150–400)
RBC: 3.33 MIL/uL — ABNORMAL LOW (ref 4.22–5.81)
RDW: 13.8 % (ref 11.5–15.5)
WBC Count: 6.8 10*3/uL (ref 4.0–10.5)
nRBC: 0 % (ref 0.0–0.2)

## 2018-12-24 NOTE — Progress Notes (Signed)
Hematology and Oncology Follow Up Visit  Adam Quinn [id number] March 03, 1956 63 y.o. 12/24/2018   Principle Diagnosis:  Iron deficiency anemia Erythropoietin deficiency anemia  Current Therapy:   IV iron as indicated    Interim History:  Adam Quinn is here today for follow-up. He is symptomatic with fatigue and dizziness due to vertigo.  His Hgb is down at 10.7, MCV 95.  He denies having any episodes of bleeding, no bruising or petechiae.  He states that he had a stomach virus last weekend but that his diarrhea has since resolved.  No fever, chills, n/v, rash, cough, rash, SOB, chest pain, palpitations, abdominal pain or changes in bowel or bladder habits.  He has had some sinus congestion and drainage due to allergies with a dry cough off and on. He is taking allergy medicine daily.  No swelling, tenderness, numbness or tingling in her extremities.  No lymphadenopathy noted on exam.  He has maintained a good appetite and is staying well hydrated. His weight is stable.   ECOG Performance Status: 1 - Symptomatic but completely ambulatory  Medications:  Allergies as of 12/24/2018      Reactions   Aspirin Hives   Shrimp [shellfish Allergy] Hives   Just shrimp      Medication List       Accurate as of December 24, 2018  2:04 PM. Always use your most recent med list.        ALAVERT PO Take 1 tablet by mouth at bedtime. Alavert D   clopidogrel 75 MG tablet Commonly known as:  PLAVIX Take 1 tablet (75 mg total) by mouth daily.   COQ10 PO Take 200 mg by mouth 2 (two) times daily.   FREESTYLE LITE test strip Generic drug:  glucose blood   gabapentin 300 MG capsule Commonly known as:  NEURONTIN Take 300 mg by mouth daily.   ibuprofen 800 MG tablet Commonly known as:  ADVIL,MOTRIN Take 800 mg by mouth every 8 (eight) hours as needed.   lansoprazole 30 MG capsule Commonly known as:  PREVACID Take 1 capsule by mouth 2 (two) times daily.   lisinopril 5 MG  tablet Commonly known as:  PRINIVIL,ZESTRIL Take 5 mg by mouth daily.   LUMIGAN 0.01 % Soln Generic drug:  bimatoprost Place 1 drop into both eyes at bedtime.   MELATONIN PO Take 9 mg by mouth at bedtime.   metFORMIN 500 MG 24 hr tablet Commonly known as:  GLUCOPHAGE-XR Take 2,000 mg by mouth at bedtime. Take four tablets at bedtime.   sertraline 100 MG tablet Commonly known as:  ZOLOFT daily.   simvastatin 20 MG tablet Commonly known as:  ZOCOR Take 20 mg by mouth every evening.   sodium bicarbonate 650 MG tablet Take 1 tablet (650 mg total) by mouth 2 (two) times daily.   TESTOSTERONE IM Inject into the muscle. Every two weeks   TOUJEO SOLOSTAR 300 UNIT/ML Sopn Generic drug:  Insulin Glargine (1 Unit Dial) Inject 44 Units into the skin at bedtime.   TRULICITY 7.78 EU/2.3NT Sopn Generic drug:  Dulaglutide Inject into the skin.       Allergies:  Allergies  Allergen Reactions  . Aspirin Hives  . Shrimp [Shellfish Allergy] Hives    Just shrimp     Past Medical History, Surgical history, Social history, and Family History were reviewed and updated.  Review of Systems: All other 10 point review of systems is negative.   Physical Exam:  vitals were not taken for  this visit.   Wt Readings from Last 3 Encounters:  09/20/18 199 lb 6.4 oz (90.4 kg)  06/17/18 200 lb (90.7 kg)  05/06/18 198 lb (89.8 kg)    Ocular: Sclerae unicteric, pupils equal, round and reactive to light Ear-nose-throat: Oropharynx clear, dentition fair Lymphatic: No cervical, supraclavicular or axillary adenopathy Lungs no rales or rhonchi, good excursion bilaterally Heart regular rate and rhythm, no murmur appreciated Abd soft, nontender, positive bowel sounds, no liver or spleen tip palpated on exam, no fluid wave  MSK no focal spinal tenderness, no joint edema Neuro: non-focal, well-oriented, appropriate affect Breasts: Deferred   Lab Results  Component Value Date   WBC 6.8  12/24/2018   HGB 10.7 (L) 12/24/2018   HCT 31.9 (L) 12/24/2018   MCV 95.8 12/24/2018   PLT 262 12/24/2018   Lab Results  Component Value Date   FERRITIN 87 09/20/2018   IRON 70 09/20/2018   TIBC 320 09/20/2018   UIBC 250 09/20/2018   IRONPCTSAT 22 (L) 09/20/2018   Lab Results  Component Value Date   RETICCTPCT 1.3 04/05/2018   RBC 3.33 (L) 12/24/2018   No results found for: KPAFRELGTCHN, LAMBDASER, KAPLAMBRATIO No results found for: IGGSERUM, IGA, IGMSERUM No results found for: Odetta Pink, SPEI   Chemistry      Component Value Date/Time   NA 139 09/20/2018 0910   NA 140 05/26/2017 0856   K 4.6 09/20/2018 0910   K 4.2 05/26/2017 0856   CL 109 (H) 09/20/2018 0910   CO2 28 09/20/2018 0910   CO2 25 05/26/2017 0856   BUN 11 09/20/2018 0910   BUN 12.7 05/26/2017 0856   CREATININE 1.20 09/20/2018 0910   CREATININE 1.2 05/26/2017 0856      Component Value Date/Time   CALCIUM 9.2 09/20/2018 0910   CALCIUM 9.5 05/26/2017 0856   ALKPHOS 73 09/20/2018 0910   ALKPHOS 83 05/26/2017 0856   AST 30 09/20/2018 0910   AST 16 05/26/2017 0856   ALT 38 09/20/2018 0910   ALT 22 05/26/2017 0856   BILITOT 0.5 09/20/2018 0910   BILITOT 0.28 05/26/2017 0856       Impression and Plan: Adam Quinn is a very pleasant 63 yo caucasian gentleman with 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 He has been symptomatic with fatigue and dizziness with Hgb 10.7.  We will see what his iron studies and testosterone levels look like and bring him back in for infusion and/or injection if needed.  We will go ahead and plan to see him back in another 4 months.  He will contact our office with any questions or concerns. We can certainly see her sooner if need be.   Laverna Peace, NP 1/31/20202:04 PM

## 2018-12-24 NOTE — Telephone Encounter (Signed)
Appointments scheduled letter/calendar mailed per 1/31 los

## 2018-12-25 LAB — TESTOSTERONE: Testosterone: 237 ng/dL — ABNORMAL LOW (ref 264–916)

## 2018-12-27 LAB — IRON AND TIBC
Iron: 48 ug/dL (ref 42–163)
SATURATION RATIOS: 15 % — AB (ref 20–55)
TIBC: 314 ug/dL (ref 202–409)
UIBC: 266 ug/dL (ref 117–376)

## 2018-12-27 LAB — FERRITIN: Ferritin: 118 ng/mL (ref 24–336)

## 2018-12-28 ENCOUNTER — Telehealth: Payer: Self-pay | Admitting: Family

## 2018-12-28 NOTE — Telephone Encounter (Signed)
Called and spoke with patient regarding appointment added per 2/4 sch msg

## 2018-12-31 ENCOUNTER — Other Ambulatory Visit: Payer: Self-pay

## 2018-12-31 ENCOUNTER — Inpatient Hospital Stay: Payer: 59 | Attending: Hematology & Oncology

## 2018-12-31 VITALS — BP 131/61 | HR 103 | Temp 97.7°F

## 2018-12-31 DIAGNOSIS — D509 Iron deficiency anemia, unspecified: Secondary | ICD-10-CM | POA: Diagnosis not present

## 2018-12-31 DIAGNOSIS — D500 Iron deficiency anemia secondary to blood loss (chronic): Secondary | ICD-10-CM

## 2018-12-31 MED ORDER — SODIUM CHLORIDE 0.9 % IV SOLN
510.0000 mg | Freq: Once | INTRAVENOUS | Status: AC
  Administered 2018-12-31: 510 mg via INTRAVENOUS
  Filled 2018-12-31: qty 17

## 2018-12-31 MED ORDER — SODIUM CHLORIDE 0.9 % IV SOLN
INTRAVENOUS | Status: DC
  Administered 2018-12-31: 14:00:00 via INTRAVENOUS
  Filled 2018-12-31: qty 250

## 2018-12-31 NOTE — Patient Instructions (Signed)

## 2018-12-31 NOTE — Progress Notes (Signed)
Patient doesn't want to stay for the 30 minute post IV Iron observation. VSS. Patient discharged ambulatory without complaints or concerns.

## 2019-01-17 ENCOUNTER — Other Ambulatory Visit: Payer: Self-pay | Admitting: Nurse Practitioner

## 2019-01-17 DIAGNOSIS — I6381 Other cerebral infarction due to occlusion or stenosis of small artery: Secondary | ICD-10-CM

## 2019-01-21 ENCOUNTER — Other Ambulatory Visit: Payer: Self-pay | Admitting: Nurse Practitioner

## 2019-01-21 DIAGNOSIS — I6381 Other cerebral infarction due to occlusion or stenosis of small artery: Secondary | ICD-10-CM

## 2019-01-27 DIAGNOSIS — H401132 Primary open-angle glaucoma, bilateral, moderate stage: Secondary | ICD-10-CM | POA: Diagnosis not present

## 2019-01-27 DIAGNOSIS — H40053 Ocular hypertension, bilateral: Secondary | ICD-10-CM | POA: Diagnosis not present

## 2019-01-27 DIAGNOSIS — H1859 Other hereditary corneal dystrophies: Secondary | ICD-10-CM | POA: Diagnosis not present

## 2019-01-27 DIAGNOSIS — H53419 Scotoma involving central area, unspecified eye: Secondary | ICD-10-CM | POA: Diagnosis not present

## 2019-04-21 ENCOUNTER — Encounter: Payer: Self-pay | Admitting: Gastroenterology

## 2019-04-21 ENCOUNTER — Ambulatory Visit (INDEPENDENT_AMBULATORY_CARE_PROVIDER_SITE_OTHER): Payer: 59 | Admitting: Gastroenterology

## 2019-04-21 ENCOUNTER — Other Ambulatory Visit: Payer: Self-pay

## 2019-04-21 DIAGNOSIS — Z86010 Personal history of colonic polyps: Secondary | ICD-10-CM | POA: Diagnosis not present

## 2019-04-21 DIAGNOSIS — Z8673 Personal history of transient ischemic attack (TIA), and cerebral infarction without residual deficits: Secondary | ICD-10-CM | POA: Diagnosis not present

## 2019-04-21 NOTE — Progress Notes (Signed)
This patient contacted our office requesting a physician telemedicine consultation regarding clinical questions and/or test results.  If new patient, they were referred by self  Participants on the conference : myself and patient   The patient consented to this consultation and was aware that a charge will be placed through their insurance.  I was in my office and the patient was at home   Encounter time:  Total time 31 minutes, with 21 minutes spent with patient on D[employer]imity   _____________________________________________________________________________________________              [facility] Consult Note:  History: Adam Quinn 04/21/2019  Referring provider: Kelton Pillar, MD  Reason for consult/chief complaint: History of colon polyp  Subjective  HPI: Seen by Dr. Deatra Quinn 2016 for iron deficiency anemia with negative Hemoccult stool cards.  EGD normal, no duodenal biopsies taken.  Colonoscopy complete to cecum with reportedly good prep removed cecal polyp, reportedly 15 mm in diameter.  Resected but not retrieved. Recommendation was for a repeat colonoscopy in 3 years.  Adam Quinn has followed regularly with hematology for iron deficiency anemia and periodic iron infusions, most recently in early February of this year.  Adam Quinn contacted [place] at the urging of his wife, who recalled that he was due for surveillance colonoscopy.  He denies abdominal pain, change in bowel habits or rectal bleeding.  He had posterior circulation CVA 3 years ago, last evaluated by neurology 2 years ago, and is maintained on Plavix, which is prescribed by his primary care provider.  ROS:  Review of Systems  He denies chest pain dyspnea or dysuria No residual weakness or dysarthria after the CVA  Past Medical History: Past Medical History:  Diagnosis Date  . Allergy    seasonal  . Anemia   . Anxiety   . Cataract    cataract surgery bilaterally resolved issues  . Chronic  kidney disease    small protien showing uses Lisinopril for this  . Diverticulitis   . Diverticulosis   . DM (diabetes mellitus) ([place])   . GERD (gastroesophageal reflux disease)   . Glaucoma   . History of kidney stones    x1  . Stroke [facility])      Past Surgical History: Past Surgical History:  Procedure Laterality Date  . cataract surgery     bilateral  . COLONOSCOPY    . COLONOSCOPY N/A 06/04/2015   Procedure: COLONOSCOPY;  Surgeon: Inda Castle, MD;  Location: [facility] ENDOSCOPY;  Service: Endoscopy;  Laterality: N/A;  . ESOPHAGOGASTRODUODENOSCOPY N/A 06/04/2015   Procedure: ESOPHAGOGASTRODUODENOSCOPY (EGD);  Surgeon: Inda Castle, MD;  Location: [facility];  Service: Endoscopy;  Laterality: N/A;  . UPPER GASTROINTESTINAL ENDOSCOPY    . WISDOM TOOTH EXTRACTION     with sedation     Family History: Family History  Problem Relation Age of Onset  . Colon polyps Father   . Lung cancer Father   . Diabetes Mother   . Dementia Mother   . Diabetes Sister        x 2  . Stroke Maternal Grandmother   . Stroke Paternal Grandfather   . Colon cancer Neg Hx   . Rectal cancer Neg Hx   . Stomach cancer Neg Hx   . Pancreatic cancer Neg Hx     Social History: Social History   Socioeconomic History  . Marital status: Married    Spouse name: Not on file  . Number of children: 3  . Years of education: Not  on file  . Highest education level: Not on file  Occupational History  . Occupation: Optometrist  Social Needs  . Financial resource strain: Not on file  . Food insecurity:    Worry: Not on file    Inability: Not on file  . Transportation needs:    Medical: Not on file    Non-medical: Not on file  Tobacco Use  . Smoking status: Former Smoker    Last attempt to quit: 10/04/1997    Years since quitting: 21.5  . Smokeless tobacco: Never Used  Substance and Sexual Activity  . Alcohol use: No  . Drug use: No  . Sexual activity: Not on file  Lifestyle  . Physical  activity:    Days per week: Not on file    Minutes per session: Not on file  . Stress: Not on file  Relationships  . Social connections:    Talks on phone: Not on file    Gets together: Not on file    Attends religious service: Not on file    Active member of club or organization: Not on file    Attends meetings of clubs or organizations: Not on file    Relationship status: Not on file  Other Topics Concern  . Not on file  Social History Narrative  . Not on file    Allergies: Allergies  Allergen Reactions  . Aspirin Hives  . Shrimp [Shellfish Allergy] Hives    Just shrimp     Outpatient Meds: Current Outpatient Medications  Medication Sig Dispense Refill  . clopidogrel (PLAVIX) 75 MG tablet Take 1 tablet (75 mg total) by mouth daily. 90 tablet 0  . Coenzyme Q10 (COQ10 PO) Take 200 mg by mouth 2 (two) times daily.    Marland Kitchen FREESTYLE LITE test strip   3  . gabapentin (NEURONTIN) 300 MG capsule Take 300 mg by mouth daily.    Marland Kitchen ibuprofen (ADVIL,MOTRIN) 800 MG tablet Take 800 mg by mouth every 8 (eight) hours as needed.    . lansoprazole (PREVACID) 30 MG capsule Take 1 capsule by mouth 2 (two) times daily.  1  . lisinopril (PRINIVIL,ZESTRIL) 5 MG tablet Take 5 mg by mouth daily.    . Loratadine (ALAVERT PO) Take 1 tablet by mouth at bedtime. Alavert D    . LUMIGAN 0.01 % SOLN Place 1 drop into both eyes at bedtime.  3  . MELATONIN PO Take 9 mg by mouth at bedtime.     . metFORMIN (GLUCOPHAGE-XR) 500 MG 24 hr tablet Take 2,000 mg by mouth at bedtime. Take four tablets at bedtime.    . sertraline (ZOLOFT) 100 MG tablet daily.   1  . simvastatin (ZOCOR) 20 MG tablet Take 20 mg by mouth every evening.    . sodium bicarbonate 650 MG tablet Take 1 tablet (650 mg total) by mouth 2 (two) times daily. 60 tablet 3  . TESTOSTERONE IM Inject into the muscle. Every two weeks    . TOUJEO SOLOSTAR 300 UNIT/ML SOPN Inject 44 Units into the skin at bedtime.   1  . TRULICITY 3.61 WE/3.1VQ SOPN  Inject into the skin.  5   No current facility-administered medications for this visit.       ___________________________________________________________________ Objective   Exam:   No exam-virtual visit  He is well-appearing, pleasant and conversational, sitting in an arm chair at home.  Labs:  CBC Latest Ref Rng & Units 12/24/2018 09/20/2018 06/17/2018  WBC 4.0 - 10.5 K/uL 6.8 5.1  5.5  Hemoglobin 13.0 - 17.0 g/dL 10.7(L) 14.0 15.1  Hematocrit 39.0 - 52.0 % 31.9(L) 42.7 44.6  Platelets 150 - 400 K/uL 262 218 223   No pathology available, since colon polyp was not retrieved.  Assessment: Encounter Diagnoses  Name Primary?  . History of colonic polyps Yes  . History of CVA (cerebrovascular accident)     He is due for a surveillance colonoscopy, and would like to proceed.  He was agreeable after discussion of procedure and risks.  Adam Quinn would like to have his procedure Quinn in the [facility] long outpatient endoscopy lab because his wife works there in scheduling.  I explained that that will significantly limit the availability because of how few slots are available in that setting, especially with current restrictions.  I offered to perform it in our outpatient endoscopy lab, and I think it would also very likely be less expensive for him to have it there versus at the hospital.  Nevertheless, he would feel most comfortable doing it in the hospital endoscopy lab.  Plan:  Colonoscopy will be arranged at [facility] long outpatient endoscopy lab..  Regarding his antiplatelet therapy, this appears to be currently managed by his primary care provider.  We will send them a copy of my note to make sure they are agreeable to holding Plavix 5 days prior to procedure.  Given how long it has been since the CVA, I suspect they will probably be agreeable.  Adam Quinn understands the small but real risk of stroke for the period of time while he is off Plavix.  Thank you for the courtesy of this  consult.  Please call me with any questions or concerns.  Nelida Meuse III  CC: Referring provider noted above

## 2019-04-22 ENCOUNTER — Other Ambulatory Visit: Payer: Self-pay

## 2019-04-22 ENCOUNTER — Inpatient Hospital Stay: Payer: 59 | Attending: Hematology & Oncology

## 2019-04-22 ENCOUNTER — Inpatient Hospital Stay (HOSPITAL_BASED_OUTPATIENT_CLINIC_OR_DEPARTMENT_OTHER): Payer: 59 | Admitting: Family

## 2019-04-22 VITALS — BP 142/75 | HR 92 | Resp 19 | Ht 68.0 in | Wt 195.4 lb

## 2019-04-22 DIAGNOSIS — D500 Iron deficiency anemia secondary to blood loss (chronic): Secondary | ICD-10-CM

## 2019-04-22 DIAGNOSIS — K909 Intestinal malabsorption, unspecified: Secondary | ICD-10-CM

## 2019-04-22 DIAGNOSIS — D509 Iron deficiency anemia, unspecified: Secondary | ICD-10-CM | POA: Diagnosis not present

## 2019-04-22 DIAGNOSIS — E291 Testicular hypofunction: Secondary | ICD-10-CM

## 2019-04-22 LAB — CBC WITH DIFFERENTIAL (CANCER CENTER ONLY)
Abs Immature Granulocytes: 0.01 10*3/uL (ref 0.00–0.07)
Basophils Absolute: 0 10*3/uL (ref 0.0–0.1)
Basophils Relative: 1 %
Eosinophils Absolute: 0.1 10*3/uL (ref 0.0–0.5)
Eosinophils Relative: 1 %
HCT: 45.3 % (ref 39.0–52.0)
Hemoglobin: 14.9 g/dL (ref 13.0–17.0)
Immature Granulocytes: 0 %
Lymphocytes Relative: 20 %
Lymphs Abs: 1.4 10*3/uL (ref 0.7–4.0)
MCH: 30.2 pg (ref 26.0–34.0)
MCHC: 32.9 g/dL (ref 30.0–36.0)
MCV: 91.9 fL (ref 80.0–100.0)
Monocytes Absolute: 0.6 10*3/uL (ref 0.1–1.0)
Monocytes Relative: 9 %
Neutro Abs: 4.8 10*3/uL (ref 1.7–7.7)
Neutrophils Relative %: 69 %
Platelet Count: 250 10*3/uL (ref 150–400)
RBC: 4.93 MIL/uL (ref 4.22–5.81)
RDW: 13 % (ref 11.5–15.5)
WBC Count: 6.9 10*3/uL (ref 4.0–10.5)
nRBC: 0 % (ref 0.0–0.2)

## 2019-04-22 LAB — CMP (CANCER CENTER ONLY)
ALT: 17 U/L (ref 0–44)
AST: 11 U/L — ABNORMAL LOW (ref 15–41)
Albumin: 4.3 g/dL (ref 3.5–5.0)
Alkaline Phosphatase: 92 U/L (ref 38–126)
Anion gap: 11 (ref 5–15)
BUN: 11 mg/dL (ref 8–23)
CO2: 28 mmol/L (ref 22–32)
Calcium: 9.2 mg/dL (ref 8.9–10.3)
Chloride: 102 mmol/L (ref 98–111)
Creatinine: 1.11 mg/dL (ref 0.61–1.24)
GFR, Est AFR Am: >60 mL/min (ref >60)
GFR, Estimated: >60 mL/min (ref >60)
Glucose, Bld: 227 mg/dL — ABNORMAL HIGH (ref 70–99)
Potassium: 4.1 mmol/L (ref 3.5–5.1)
Sodium: 141 mmol/L (ref 135–145)
Total Bilirubin: 0.3 mg/dL (ref 0.3–1.2)
Total Protein: 7 g/dL (ref 6.5–8.1)

## 2019-04-22 NOTE — Progress Notes (Signed)
Hematology and Oncology Follow Up Visit  Adam Quinn [id number] 22-Jun-1956 63 y.o. 04/22/2019   Principle Diagnosis:  Iron deficiency anemia Erythropoietin deficiency anemia  Current Therapy:   IV iron as indicated   Interim History:  [name] is here today for follow-up. He is doing fairly well but has had some fatigue at times. He was laid off from work in February and is back to [employer] which he states has been slow due to the virus.  He states that he is due and waiting to be able to schedule his colonoscopy in the next couple months.  No episodes of bleeding, no bruising or petechiae.  He denies fever, chills, n/v, cough, rash, dizziness, SOB, chest pain, palpitations, abdominal pain or changes in bowel or bladder habits.  No swelling, numbness or tingling in his extremities at this time.  Generalized aches and pains that wax and wane.  No lymphadenopathy noted on exam.  He is eating well and staying hydrated. His weight is stable.   ECOG Performance Status: 1 - Symptomatic but completely ambulatory  Medications:  Allergies as of 04/22/2019      Reactions   Aspirin Hives   Shrimp [shellfish Allergy] Hives   Just shrimp      Medication List       Accurate as of Apr 22, 2019  1:36 PM. If you have any questions, ask your nurse or doctor.        ALAVERT PO Take 1 tablet by mouth at bedtime. Alavert D   clopidogrel 75 MG tablet Commonly known as:  PLAVIX Take 1 tablet (75 mg total) by mouth daily.   COQ10 PO Take 200 mg by mouth 2 (two) times daily.   FREESTYLE LITE test strip Generic drug:  glucose blood   gabapentin 300 MG capsule Commonly known as:  NEURONTIN Take 300 mg by mouth daily.   ibuprofen 800 MG tablet Commonly known as:  ADVIL Take 800 mg by mouth every 8 (eight) hours as needed.   lansoprazole 30 MG capsule Commonly known as:  PREVACID Take 1 capsule by mouth 2 (two) times daily.   lisinopril 5 MG tablet Commonly known  as:  ZESTRIL Take 5 mg by mouth daily.   Lumigan 0.01 % Soln Generic drug:  bimatoprost Place 1 drop into both eyes at bedtime.   MELATONIN PO Take 9 mg by mouth at bedtime.   metFORMIN 500 MG 24 hr tablet Commonly known as:  GLUCOPHAGE-XR Take 2,000 mg by mouth at bedtime. Take four tablets at bedtime.   sertraline 100 MG tablet Commonly known as:  ZOLOFT daily.   simvastatin 20 MG tablet Commonly known as:  ZOCOR Take 20 mg by mouth every evening.   sodium bicarbonate 650 MG tablet Take 1 tablet (650 mg total) by mouth 2 (two) times daily.   TESTOSTERONE IM Inject into the muscle. Every two weeks   Toujeo SoloStar 300 UNIT/ML Sopn Generic drug:  Insulin Glargine (1 Unit Dial) Inject 44 Units into the skin at bedtime.   Trulicity 5.27 PO/2.4MP Sopn Generic drug:  Dulaglutide Inject into the skin.       Allergies:  Allergies  Allergen Reactions  . Aspirin Hives  . Shrimp [Shellfish Allergy] Hives    Just shrimp     Past Medical History, Surgical history, Social history, and Family History were reviewed and updated.  Review of Systems: All other 10 point review of systems is negative.   Physical Exam:  vitals were not  taken for this visit.   Wt Readings from Last 3 Encounters:  12/24/18 196 lb 12.8 oz (89.3 kg)  09/20/18 199 lb 6.4 oz (90.4 kg)  06/17/18 200 lb (90.7 kg)    Ocular: Sclerae unicteric, pupils equal, round and reactive to light Ear-nose-throat: Oropharynx clear, dentition fair Lymphatic: No cervical or supraclavicular adenopathy Lungs no rales or rhonchi, good excursion bilaterally Heart regular rate and rhythm, no murmur appreciated Abd soft, nontender, positive bowel sounds, no liver or spleen tip palpated on exam, no fluid wave  MSK no focal spinal tenderness, no joint edema Neuro: non-focal, well-oriented, appropriate affect Breasts: Deferred   Lab Results  Component Value Date   WBC 6.8 12/24/2018   HGB 10.7 (L) 12/24/2018    HCT 31.9 (L) 12/24/2018   MCV 95.8 12/24/2018   PLT 262 12/24/2018   Lab Results  Component Value Date   FERRITIN 118 12/24/2018   IRON 48 12/24/2018   TIBC 314 12/24/2018   UIBC 266 12/24/2018   IRONPCTSAT 15 (L) 12/24/2018   Lab Results  Component Value Date   RETICCTPCT 1.3 04/05/2018   RBC 3.33 (L) 12/24/2018   No results found for: KPAFRELGTCHN, LAMBDASER, KAPLAMBRATIO No results found for: Kandis Cocking, IGMSERUM No results found for: Odetta Pink, SPEI   Chemistry      Component Value Date/Time   NA 137 12/24/2018 1343   NA 140 05/26/2017 0856   K 4.1 12/24/2018 1343   K 4.2 05/26/2017 0856   CL 101 12/24/2018 1343   CO2 26 12/24/2018 1343   CO2 25 05/26/2017 0856   BUN 13 12/24/2018 1343   BUN 12.7 05/26/2017 0856   CREATININE 1.14 12/24/2018 1343   CREATININE 1.2 05/26/2017 0856      Component Value Date/Time   CALCIUM 9.6 12/24/2018 1343   CALCIUM 9.5 05/26/2017 0856   ALKPHOS 87 12/24/2018 1343   ALKPHOS 83 05/26/2017 0856   AST 15 12/24/2018 1343   AST 16 05/26/2017 0856   ALT 29 12/24/2018 1343   ALT 22 05/26/2017 0856   BILITOT 0.3 12/24/2018 1343   BILITOT 0.28 05/26/2017 0856       Impression and Plan: 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  He has had some fatigue at times. Hgb is 14.9, MCV 91.  We will see what his iron studies show and bring him back in for infusion if needed. We will go ahead and plan to see him back in another 6 months.  He will contact our office with any questions or concerns. We can certainly see him sooner if need be.   Laverna Peace, NP 5/29/20201:36 PM

## 2019-04-23 LAB — TESTOSTERONE: Testosterone: 229 ng/dL — ABNORMAL LOW (ref 264–916)

## 2019-04-25 LAB — FERRITIN: Ferritin: 99 ng/mL (ref 24–336)

## 2019-04-25 LAB — IRON AND TIBC
Iron: 61 ug/dL (ref 42–163)
Saturation Ratios: 18 % — ABNORMAL LOW (ref 20–55)
TIBC: 338 ug/dL (ref 202–409)
UIBC: 277 ug/dL (ref 117–376)

## 2019-04-26 ENCOUNTER — Telehealth: Payer: Self-pay | Admitting: Hematology & Oncology

## 2019-04-26 NOTE — Telephone Encounter (Signed)
lmom to inform pt of iron infusion 6/5 at 1030 am per 6/2 sch msg

## 2019-04-27 ENCOUNTER — Other Ambulatory Visit: Payer: Self-pay

## 2019-04-27 ENCOUNTER — Telehealth: Payer: Self-pay

## 2019-04-27 DIAGNOSIS — Z8673 Personal history of transient ischemic attack (TIA), and cerebral infarction without residual deficits: Secondary | ICD-10-CM

## 2019-04-27 DIAGNOSIS — Z86010 Personal history of colonic polyps: Secondary | ICD-10-CM

## 2019-04-27 NOTE — Telephone Encounter (Deleted)
[facility] Pre-operative Risk Assessment     Request for surgical clearance:     Endoscopy Procedure  What type of surgery is being performed?     Colonoscopy   When is this surgery scheduled?     05-05-2019  What type of clearance is required ?   Pharmacy  Are there any medications that need to be held prior to surgery and how long? Yes, Plavix, 5 days prior  Practice name and name of physician performing surgery?      [facility]  Anesthesia type (None, local, MAC, general) ? MAC  Thank you,   Please route back to [facility]

## 2019-04-27 NOTE — Telephone Encounter (Signed)
Letter faxed to Dr Wonda Amis office for request for clearance to hold Plavix prior to colonoscopy

## 2019-04-28 DIAGNOSIS — H401132 Primary open-angle glaucoma, bilateral, moderate stage: Secondary | ICD-10-CM | POA: Diagnosis not present

## 2019-04-28 DIAGNOSIS — H40053 Ocular hypertension, bilateral: Secondary | ICD-10-CM | POA: Diagnosis not present

## 2019-04-29 ENCOUNTER — Other Ambulatory Visit: Payer: Self-pay

## 2019-04-29 ENCOUNTER — Inpatient Hospital Stay: Payer: 59 | Attending: Hematology & Oncology

## 2019-04-29 VITALS — BP 134/70 | HR 94 | Temp 97.6°F | Resp 20

## 2019-04-29 DIAGNOSIS — D509 Iron deficiency anemia, unspecified: Secondary | ICD-10-CM | POA: Insufficient documentation

## 2019-04-29 DIAGNOSIS — K909 Intestinal malabsorption, unspecified: Secondary | ICD-10-CM | POA: Diagnosis not present

## 2019-04-29 DIAGNOSIS — D500 Iron deficiency anemia secondary to blood loss (chronic): Secondary | ICD-10-CM

## 2019-04-29 MED ORDER — SODIUM CHLORIDE 0.9 % IV SOLN
INTRAVENOUS | Status: DC
  Administered 2019-04-29: 11:00:00 via INTRAVENOUS
  Filled 2019-04-29: qty 250

## 2019-04-29 MED ORDER — SODIUM CHLORIDE 0.9 % IV SOLN
510.0000 mg | Freq: Once | INTRAVENOUS | Status: AC
  Administered 2019-04-29: 510 mg via INTRAVENOUS
  Filled 2019-04-29: qty 17

## 2019-04-29 NOTE — Patient Instructions (Signed)

## 2019-04-29 NOTE — Telephone Encounter (Signed)
Incoming fax reply from Dr Wonda Amis office. Pt has been notified and aware.  He states clear understanding to hold 5 days prior to procedure.

## 2019-05-02 ENCOUNTER — Ambulatory Visit: Payer: 59 | Admitting: Nurse Practitioner

## 2019-05-03 ENCOUNTER — Telehealth: Payer: Self-pay | Admitting: Gastroenterology

## 2019-05-03 NOTE — Telephone Encounter (Signed)
Patient advised that he needs to use Suprep as per Dr. Loletha Carrow instructions.

## 2019-05-03 NOTE — Telephone Encounter (Signed)
Patient has a procedure on 6-15 at [facility]  And would like to know if he can use miralax

## 2019-05-04 ENCOUNTER — Telehealth: Payer: Self-pay

## 2019-05-04 NOTE — Telephone Encounter (Signed)
Adam Quinn the patients wife called on his behalf. She does not want him to take Suprep bowel prep ( he did last time 2016) due to the "side effects" she only wants him to do the Miralx prep ( she's a nurse) she said he's done well on this before. Would you like to to the Miralax with a 2 day prep?  Please advise?

## 2019-05-05 ENCOUNTER — Other Ambulatory Visit: Payer: Self-pay

## 2019-05-05 ENCOUNTER — Other Ambulatory Visit (HOSPITAL_COMMUNITY)
Admission: RE | Admit: 2019-05-05 | Discharge: 2019-05-05 | Disposition: A | Discharge status: Home / Self Care | Payer: 59 | Source: Ambulatory Visit | Attending: Gastroenterology | Admitting: Gastroenterology

## 2019-05-05 DIAGNOSIS — Z1159 Encounter for screening for other viral diseases: Secondary | ICD-10-CM | POA: Diagnosis not present

## 2019-05-05 NOTE — Telephone Encounter (Signed)
He had Suprep for the 2016 exam, and the prep was reportedly good.  I have not found that the MiraLAX prep consistently works as well as the [employer], but if they would prefer to use the MiraLAX prep, I am agreeable.  If you would like to do MiraLAX prep, then it would require two 5 mg Dulcolax tablets the morning of prep day, then 2 more 5 mg Dulcolax tablets at about 3 PM prep day, with split dose MiraLAX solution to start per usual evening time (5-6 PM)

## 2019-05-05 NOTE — Telephone Encounter (Signed)
New instructions have been sent on mychart as patient wanted to proceed with the Miralax prep.

## 2019-05-06 ENCOUNTER — Other Ambulatory Visit: Payer: Self-pay

## 2019-05-06 ENCOUNTER — Encounter (HOSPITAL_COMMUNITY): Payer: Self-pay

## 2019-05-06 LAB — NOVEL CORONAVIRUS, NAA (HOSP ORDER, SEND-OUT TO REF LAB; TAT 18-24 HRS): SARS-CoV-2, NAA: NOT DETECTED

## 2019-05-06 NOTE — Progress Notes (Signed)
SPOKE W/ Martin     SCREENING SYMPTOMS OF COVID 19:   COUGH--NO  RUNNY NOSE--- NO  SORE THROAT---NO  NASAL CONGESTION----NO  SNEEZING----NO  SHORTNESS OF BREATH---NO  DIFFICULTY BREATHING---NO  TEMP >100.0 -----NO  UNEXPLAINED BODY ACHES------NO  CHILLS -------- NO  HEADACHES ---------NO  LOSS OF SMELL/ TASTE --------NO    HAVE YOU OR ANY FAMILY MEMBER TRAVELLED PAST 14 DAYS OUT OF THE   COUNTY---TRAVELLED TO [place] STATE----NO COUNTRY----NO  HAVE YOU OR ANY FAMILY MEMBER BEEN EXPOSED TO ANYONE WITH COVID 19? NO

## 2019-05-09 ENCOUNTER — Ambulatory Visit (HOSPITAL_COMMUNITY)
Admission: RE | Admit: 2019-05-09 | Discharge: 2019-05-09 | Disposition: A | Discharge status: Home / Self Care | Payer: 59 | Attending: Gastroenterology | Admitting: Gastroenterology

## 2019-05-09 ENCOUNTER — Encounter (HOSPITAL_COMMUNITY): Payer: Self-pay | Admitting: *Deleted

## 2019-05-09 ENCOUNTER — Ambulatory Visit (HOSPITAL_COMMUNITY): Payer: 59 | Admitting: Anesthesiology

## 2019-05-09 ENCOUNTER — Other Ambulatory Visit: Payer: Self-pay

## 2019-05-09 ENCOUNTER — Encounter (HOSPITAL_COMMUNITY)
Admission: RE | Disposition: A | Discharge status: Home / Self Care | Payer: Self-pay | Source: Home / Self Care | Attending: Gastroenterology

## 2019-05-09 DIAGNOSIS — Z87891 Personal history of nicotine dependence: Secondary | ICD-10-CM | POA: Insufficient documentation

## 2019-05-09 DIAGNOSIS — E1122 Type 2 diabetes mellitus with diabetic chronic kidney disease: Secondary | ICD-10-CM | POA: Diagnosis not present

## 2019-05-09 DIAGNOSIS — G4733 Obstructive sleep apnea (adult) (pediatric): Secondary | ICD-10-CM | POA: Diagnosis not present

## 2019-05-09 DIAGNOSIS — K5790 Diverticulosis of intestine, part unspecified, without perforation or abscess without bleeding: Secondary | ICD-10-CM | POA: Diagnosis not present

## 2019-05-09 DIAGNOSIS — Z86010 Personal history of colonic polyps: Secondary | ICD-10-CM | POA: Insufficient documentation

## 2019-05-09 DIAGNOSIS — E1142 Type 2 diabetes mellitus with diabetic polyneuropathy: Secondary | ICD-10-CM | POA: Diagnosis not present

## 2019-05-09 DIAGNOSIS — Z8673 Personal history of transient ischemic attack (TIA), and cerebral infarction without residual deficits: Secondary | ICD-10-CM | POA: Diagnosis not present

## 2019-05-09 DIAGNOSIS — K5730 Diverticulosis of large intestine without perforation or abscess without bleeding: Secondary | ICD-10-CM | POA: Insufficient documentation

## 2019-05-09 DIAGNOSIS — I129 Hypertensive chronic kidney disease with stage 1 through stage 4 chronic kidney disease, or unspecified chronic kidney disease: Secondary | ICD-10-CM | POA: Insufficient documentation

## 2019-05-09 DIAGNOSIS — I10 Essential (primary) hypertension: Secondary | ICD-10-CM | POA: Diagnosis not present

## 2019-05-09 DIAGNOSIS — Z09 Encounter for follow-up examination after completed treatment for conditions other than malignant neoplasm: Secondary | ICD-10-CM | POA: Diagnosis not present

## 2019-05-09 DIAGNOSIS — N189 Chronic kidney disease, unspecified: Secondary | ICD-10-CM | POA: Diagnosis not present

## 2019-05-09 DIAGNOSIS — D649 Anemia, unspecified: Secondary | ICD-10-CM | POA: Diagnosis not present

## 2019-05-09 HISTORY — DX: Scotoma involving central area, unspecified eye: H53.419

## 2019-05-09 HISTORY — PX: COLONOSCOPY WITH PROPOFOL: SHX5780

## 2019-05-09 HISTORY — DX: Personal history of colon polyps, unspecified: Z86.0100

## 2019-05-09 HISTORY — DX: Iron deficiency anemia, unspecified: D50.9

## 2019-05-09 HISTORY — DX: Presence of spectacles and contact lenses: Z97.3

## 2019-05-09 HISTORY — DX: Obstructive sleep apnea (adult) (pediatric): G47.33

## 2019-05-09 HISTORY — DX: Polyneuropathy, unspecified: G62.9

## 2019-05-09 HISTORY — DX: Personal history of other diseases of the digestive system: Z87.19

## 2019-05-09 HISTORY — DX: Personal history of colonic polyps: Z86.010

## 2019-05-09 LAB — GLUCOSE, CAPILLARY: Glucose-Capillary: 173 mg/dL — ABNORMAL HIGH (ref 70–99)

## 2019-05-09 SURGERY — COLONOSCOPY WITH PROPOFOL
Anesthesia: Monitor Anesthesia Care

## 2019-05-09 MED ORDER — PROPOFOL 10 MG/ML IV BOLUS
INTRAVENOUS | Status: AC
  Filled 2019-05-09: qty 20

## 2019-05-09 MED ORDER — PHENYLEPHRINE 40 MCG/ML (10ML) SYRINGE FOR IV PUSH (FOR BLOOD PRESSURE SUPPORT)
PREFILLED_SYRINGE | INTRAVENOUS | Status: DC | PRN
  Administered 2019-05-09 (×2): 80 ug via INTRAVENOUS

## 2019-05-09 MED ORDER — LIDOCAINE 2% (20 MG/ML) 5 ML SYRINGE
INTRAMUSCULAR | Status: DC | PRN
  Administered 2019-05-09: 80 mg via INTRAVENOUS

## 2019-05-09 MED ORDER — PROPOFOL 500 MG/50ML IV EMUL
INTRAVENOUS | Status: DC | PRN
  Administered 2019-05-09: 150 ug/kg/min via INTRAVENOUS

## 2019-05-09 MED ORDER — LACTATED RINGERS IV SOLN
INTRAVENOUS | Status: DC
  Administered 2019-05-09: 1000 mL via INTRAVENOUS

## 2019-05-09 MED ORDER — PROPOFOL 10 MG/ML IV BOLUS
INTRAVENOUS | Status: AC
  Filled 2019-05-09: qty 40

## 2019-05-09 MED ORDER — SODIUM CHLORIDE 0.9 % IV SOLN: INTRAVENOUS | Status: DC

## 2019-05-09 MED ORDER — PROPOFOL 10 MG/ML IV BOLUS
INTRAVENOUS | Status: DC | PRN
  Administered 2019-05-09: 20 mg via INTRAVENOUS
  Administered 2019-05-09: 30 mg via INTRAVENOUS

## 2019-05-09 SURGICAL SUPPLY — 21 items

## 2019-05-09 NOTE — Anesthesia Postprocedure Evaluation (Signed)
Anesthesia Post Note  Patient: Adam Quinn  Procedure(s) Performed: COLONOSCOPY WITH PROPOFOL (N/A )     Patient location during evaluation: PACU Anesthesia Type: MAC Level of consciousness: awake and alert Pain management: pain level controlled Vital Signs Assessment: post-procedure vital signs reviewed and stable Respiratory status: spontaneous breathing, nonlabored ventilation, respiratory function stable and patient connected to nasal cannula oxygen Cardiovascular status: stable and blood pressure returned to baseline Postop Assessment: no apparent nausea or vomiting Anesthetic complications: no    Last Vitals:  Vitals:   05/09/19 1010 05/09/19 1018  BP: (!) 117/59 131/78  Pulse: 93 92  Resp: 16 13  Temp:    SpO2: 100% 97%    Last Pain:  Vitals:   05/09/19 1004  TempSrc: Other (Comment)  PainSc: 0-No pain                 Tiajuana Amass

## 2019-05-09 NOTE — Discharge Instructions (Signed)
RESUME PLAVIX (CLOPIDOGREL) TODAY.  _________________________________________________________________________________  YOU HAD AN ENDOSCOPIC PROCEDURE TODAY: Refer to the procedure report and other information in the discharge instructions given to you for any specific questions about what was found during the examination. If this information does not answer your questions, please call [place] office at [phone] to clarify.   YOU SHOULD EXPECT: Some feelings of bloating in the abdomen. Passage of more gas than usual. Walking can help get rid of the air that was put into your GI tract during the procedure and reduce the bloating. If you had a lower endoscopy (such as a colonoscopy or flexible sigmoidoscopy) you may notice spotting of blood in your stool or on the toilet paper. Some abdominal soreness may be present for a day or two, also.  DIET: Your first meal following the procedure should be a light meal and then it is ok to progress to your normal diet. A half-sandwich or bowl of soup is an example of a good first meal. Heavy or fried foods are harder to digest and may make you feel nauseous or bloated. Drink plenty of fluids but you should avoid alcoholic beverages for 24 hours. If you had a esophageal dilation, please see attached instructions for diet.    ACTIVITY: Your care partner should take you home directly after the procedure. You should plan to take it easy, moving slowly for the rest of the day. You can resume normal activity the day after the procedure however YOU SHOULD NOT DRIVE, use power tools, machinery or perform tasks that involve climbing or major physical exertion for 24 hours (because of the sedation medicines used during the test).   SYMPTOMS TO REPORT IMMEDIATELY: A gastroenterologist can be reached at any hour. Please call [phone]  for any of the following symptoms:  Following lower endoscopy (colonoscopy, flexible sigmoidoscopy) Excessive amounts of blood in the  stool  Significant tenderness, worsening of abdominal pains  Swelling of the abdomen that is new, acute  Fever of 100 or higher   FOLLOW UP:  If any biopsies were taken you will be contacted by phone or by letter within the next 1-3 weeks. Call [phone]  if you have not heard about the biopsies in 3 weeks.  Please also call with any specific questions about appointments or follow up tests.

## 2019-05-09 NOTE — Op Note (Signed)
[facility] Patient Name: Adam Quinn Procedure Date: 05/09/2019 MRN: [id number] Attending MD: Estill Cotta. Loletha Carrow , MD Date of Birth: [birth year] CSN: [id number] Age: 63 Admit Type: Outpatient Procedure:                Colonoscopy Indications:              Surveillance: Personal history of adenomatous                            polyps on last colonoscopy 5 years ago (78mm TA;                            2015) Providers:                Estill Cotta. Loletha Carrow, MD, Cleda Daub, RN, Cherylynn Ridges, Technician, Caryl Pina CRNA Referring MD:              Medicines:                 Complications:            No immediate complications. Estimated Blood Loss:     Estimated blood loss: none. Procedure:                Pre-Anesthesia Assessment:                           - Prior to the procedure, a History and Physical                            was performed, and patient medications and                            allergies were reviewed. The patient's tolerance of                            previous anesthesia was also reviewed. The risks                            and benefits of the procedure and the sedation                            options and risks were discussed with the patient.                            All questions were answered, and informed consent                            was obtained. Prior Anticoagulants: The patient has                            taken Plavix (clopidogrel), last dose was 5 days                            prior  to procedure. ASA Grade Assessment: III - A                            patient with severe systemic disease. After                            reviewing the risks and benefits, the patient was                            deemed in satisfactory condition to undergo the                            procedure.                           After obtaining informed consent, the colonoscope                            was passed  under direct vision. Throughout the                            procedure, the patient's blood pressure, pulse, and                            oxygen saturations were monitored continuously. The                            CF-HQ190L ([id number] Olympus colonoscope was                            introduced through the anus and advanced to the the                            cecum, identified by appendiceal orifice and                            ileocecal valve. The colonoscopy was performed with                            difficulty due to inadequate bowel prep. The                            patient tolerated the procedure well. The quality                            of the bowel preparation was poor. The ileocecal                            valve, appendiceal orifice, and rectum were                            photographed. Scope In: 9:45:04 AM Scope Out: 9:55:57 AM Scope Withdrawal Time: 0 hours 7 minutes 46 seconds  Total Procedure Duration: 0 hours 10 minutes 53 seconds  Findings:  The perianal and digital rectal examinations were normal.      Copious quantities of semi-solid stool was found in the entire colon,       precluding visualization. Lavage of the area was performed, resulting in       incomplete clearance with continued poor visualization.      Diverticula were found in the entire colon.      Retroflexion in the rectum was not performed. Impression:               - Preparation of the colon was poor.                           - Stool in the entire examined colon.                           - Diverticulosis in the entire examined colon.                           - No specimens collected. Moderate Sedation:      Not Applicable - Patient had care per Anesthesia. Recommendation:           - Patient has a contact number available for                            emergencies. The signs and symptoms of potential                            delayed complications were discussed with  the                            patient. Return to normal activities tomorrow.                            Written discharge instructions were provided to the                            patient.                           - Resume previous diet.                           - Resume Plavix (clopidogrel) at prior dose today.                           - Repeat colonoscopy in 6 months for surveillance                            (4 liter PEG prep). Procedure Code(s):        --- Professional ---                           [phone], Colonoscopy, flexible; diagnostic, including                            collection of specimen(s) by brushing or washing,  when performed (separate procedure) Diagnosis Code(s):        --- Professional ---                           Z86.010, Personal history of colonic polyps CPT copyright 2019 [employer] Medical Association. All rights reserved. The codes documented in this report are preliminary and upon coder review may  be revised to meet current compliance requirements. Henry L. Loletha Carrow, MD 05/09/2019 10:06:08 AM This report has been signed electronically. Number of Addenda: 0

## 2019-05-09 NOTE — Transfer of Care (Signed)
Immediate Anesthesia Transfer of Care Note  Patient: Adam Quinn  Procedure(s) Performed: COLONOSCOPY WITH PROPOFOL (N/A )  Patient Location: Endoscopy Unit  Anesthesia Type:MAC  Level of Consciousness: awake, alert , oriented and patient cooperative  Airway & Oxygen Therapy: Patient Spontanous Breathing and Patient connected to face mask oxygen  Post-op Assessment: Report given to RN, Post -op Vital signs reviewed and stable and Patient moving all extremities  Post vital signs: Reviewed and stable  Last Vitals:  Vitals Value Taken Time  BP    Temp    Pulse    Resp    SpO2      Last Pain:  Vitals:   05/09/19 0841  TempSrc: Oral  PainSc: 0-No pain         Complications: No apparent anesthesia complications

## 2019-05-09 NOTE — H&P (Signed)
History:  This patient presents for endoscopic testing for history of colon polyp.  Adam Quinn Referring physician: Kelton Pillar, MD  Past Medical History: Past Medical History:  Diagnosis Date  . Allergy    seasonal  . Anxiety   . Cataract    cataract surgery bilaterally resolved issues  . Central scotoma   . Chronic kidney disease    small protien showing uses Lisinopril for this  . Diverticulitis   . Diverticulosis   . DM (diabetes mellitus) ([place])   . GERD (gastroesophageal reflux disease)   . Glaucoma   . History of colon polyps   . History of hiatal hernia   . History of kidney stones    x1  . Iron deficiency anemia    Iron infusion 04/29/2019  . Neuropathy   . OSA on CPAP   . Stroke ([place])   . Wears glasses    READING     Past Surgical History: Past Surgical History:  Procedure Laterality Date  . cataract surgery     bilateral  . COLONOSCOPY    . COLONOSCOPY N/A 06/04/2015   Procedure: COLONOSCOPY;  Surgeon: Inda Castle, MD;  Location: [facility];  Service: Endoscopy;  Laterality: N/A;  . ESOPHAGOGASTRODUODENOSCOPY N/A 06/04/2015   Procedure: ESOPHAGOGASTRODUODENOSCOPY (EGD);  Surgeon: Inda Castle, MD;  Location: [facility];  Service: Endoscopy;  Laterality: N/A;  . UPPER GASTROINTESTINAL ENDOSCOPY    . WISDOM TOOTH EXTRACTION     with sedation    Allergies: Allergies  Allergen Reactions  . Aspirin Hives  . Shrimp [Shellfish Allergy] Hives    Just shrimp     Outpatient Meds: Current Facility-Administered Medications  Medication Dose Route Frequency Provider Last Rate Last Dose  . 0.9 %  sodium chloride infusion   Intravenous Continuous Danis, Estill Cotta III, MD      . lactated ringers infusion   Intravenous Continuous Nelida Meuse III, MD 10 mL/hr at 05/09/19 0912 1,000 mL at 05/09/19 0912      ___________________________________________________________________ Objective   Exam:  BP (!) 153/84   Pulse (!) 109   Temp 98.5  F (36.9 C) (Oral)   Resp 16   Ht 5\' 8"  (1.727 m)   Wt 86.2 kg   SpO2 97%   BMI 28.89 kg/m    CV: RRR without murmur, S1/S2, no JVD, no peripheral edema  Resp: clear to auscultation bilaterally, normal RR and effort noted  GI: soft, no tenderness, with active bowel sounds. No guarding or palpable organomegaly noted. Large upper abdominal wall rectus diastasis  Neuro: awake, alert and oriented x 3. Normal gross motor function and fluent speech   Assessment:  Hx colon polyps  Plan:  Colonoscopy   Nelida Meuse III

## 2019-05-09 NOTE — Anesthesia Preprocedure Evaluation (Addendum)
Anesthesia Evaluation  Patient identified by MRN, date of birth, ID band Patient awake    Reviewed: Allergy & Precautions, NPO status , Patient's Chart, lab work & pertinent test results  Airway Mallampati: II       Dental  (+) Dental Advisory Given   Pulmonary sleep apnea and Continuous Positive Airway Pressure Ventilation , former smoker,    breath sounds clear to auscultation       Cardiovascular hypertension,  Rhythm:Regular Rate:Normal     Neuro/Psych CVA    GI/Hepatic Neg liver ROS, hiatal hernia, GERD  ,  Endo/Other  diabetes, Type 2  Renal/GU Renal disease     Musculoskeletal   Abdominal   Peds  Hematology  (+) anemia ,   Anesthesia Other Findings   Reproductive/Obstetrics                            Anesthesia Physical Anesthesia Plan  ASA: III  Anesthesia Plan: MAC   Post-op Pain Management:    Induction:   PONV Risk Score and Plan: 1 and Propofol infusion, Ondansetron and Treatment may vary due to age or medical condition  Airway Management Planned: Simple Face Mask and Natural Airway  Additional Equipment: None  Intra-op Plan:   Post-operative Plan:   Informed Consent: I have reviewed the patients History and Physical, chart, labs and discussed the procedure including the risks, benefits and alternatives for the proposed anesthesia with the patient or authorized representative who has indicated his/her understanding and acceptance.       Plan Discussed with: CRNA  Anesthesia Plan Comments:         Anesthesia Quick Evaluation

## 2019-05-10 DIAGNOSIS — E1121 Type 2 diabetes mellitus with diabetic nephropathy: Secondary | ICD-10-CM | POA: Diagnosis not present

## 2019-05-10 DIAGNOSIS — E291 Testicular hypofunction: Secondary | ICD-10-CM | POA: Diagnosis not present

## 2019-06-27 ENCOUNTER — Telehealth: Payer: Self-pay | Admitting: *Deleted

## 2019-06-27 ENCOUNTER — Emergency Department (HOSPITAL_BASED_OUTPATIENT_CLINIC_OR_DEPARTMENT_OTHER): Payer: 59

## 2019-06-27 ENCOUNTER — Emergency Department (HOSPITAL_BASED_OUTPATIENT_CLINIC_OR_DEPARTMENT_OTHER)
Admission: EM | Admit: 2019-06-27 | Discharge: 2019-06-27 | Disposition: A | Discharge status: Home / Self Care | Payer: 59 | Attending: Emergency Medicine | Admitting: Emergency Medicine

## 2019-06-27 ENCOUNTER — Emergency Department (HOSPITAL_COMMUNITY): Payer: 59

## 2019-06-27 ENCOUNTER — Encounter (HOSPITAL_BASED_OUTPATIENT_CLINIC_OR_DEPARTMENT_OTHER): Payer: Self-pay

## 2019-06-27 ENCOUNTER — Other Ambulatory Visit: Payer: Self-pay

## 2019-06-27 DIAGNOSIS — E119 Type 2 diabetes mellitus without complications: Secondary | ICD-10-CM | POA: Insufficient documentation

## 2019-06-27 DIAGNOSIS — Z87891 Personal history of nicotine dependence: Secondary | ICD-10-CM | POA: Diagnosis not present

## 2019-06-27 DIAGNOSIS — G4733 Obstructive sleep apnea (adult) (pediatric): Secondary | ICD-10-CM

## 2019-06-27 DIAGNOSIS — Z7984 Long term (current) use of oral hypoglycemic drugs: Secondary | ICD-10-CM | POA: Diagnosis not present

## 2019-06-27 DIAGNOSIS — Z79899 Other long term (current) drug therapy: Secondary | ICD-10-CM | POA: Insufficient documentation

## 2019-06-27 DIAGNOSIS — K219 Gastro-esophageal reflux disease without esophagitis: Secondary | ICD-10-CM | POA: Diagnosis present

## 2019-06-27 DIAGNOSIS — I639 Cerebral infarction, unspecified: Secondary | ICD-10-CM | POA: Diagnosis present

## 2019-06-27 DIAGNOSIS — E785 Hyperlipidemia, unspecified: Secondary | ICD-10-CM | POA: Diagnosis present

## 2019-06-27 DIAGNOSIS — I10 Essential (primary) hypertension: Secondary | ICD-10-CM | POA: Diagnosis not present

## 2019-06-27 DIAGNOSIS — Z7901 Long term (current) use of anticoagulants: Secondary | ICD-10-CM | POA: Insufficient documentation

## 2019-06-27 DIAGNOSIS — R202 Paresthesia of skin: Secondary | ICD-10-CM | POA: Diagnosis not present

## 2019-06-27 DIAGNOSIS — Z20828 Contact with and (suspected) exposure to other viral communicable diseases: Secondary | ICD-10-CM | POA: Diagnosis not present

## 2019-06-27 DIAGNOSIS — J3489 Other specified disorders of nose and nasal sinuses: Secondary | ICD-10-CM | POA: Diagnosis not present

## 2019-06-27 DIAGNOSIS — F329 Major depressive disorder, single episode, unspecified: Secondary | ICD-10-CM

## 2019-06-27 DIAGNOSIS — R200 Anesthesia of skin: Secondary | ICD-10-CM | POA: Diagnosis present

## 2019-06-27 DIAGNOSIS — F32A Depression, unspecified: Secondary | ICD-10-CM

## 2019-06-27 DIAGNOSIS — R51 Headache: Secondary | ICD-10-CM | POA: Diagnosis not present

## 2019-06-27 DIAGNOSIS — Z9989 Dependence on other enabling machines and devices: Secondary | ICD-10-CM

## 2019-06-27 LAB — COMPREHENSIVE METABOLIC PANEL
ALT: 20 U/L (ref 0–44)
AST: 15 U/L (ref 15–41)
Albumin: 4.1 g/dL (ref 3.5–5.0)
Alkaline Phosphatase: 95 U/L (ref 38–126)
Anion gap: 11 (ref 5–15)
BUN: 14 mg/dL (ref 8–23)
CO2: 24 mmol/L (ref 22–32)
Calcium: 9.6 mg/dL (ref 8.9–10.3)
Chloride: 104 mmol/L (ref 98–111)
Creatinine, Ser: 0.92 mg/dL (ref 0.61–1.24)
GFR calc Af Amer: >60 mL/min (ref >60)
GFR calc non Af Amer: >60 mL/min (ref >60)
Glucose, Bld: 142 mg/dL — ABNORMAL HIGH (ref 70–99)
Potassium: 4.7 mmol/L (ref 3.5–5.1)
Sodium: 139 mmol/L (ref 135–145)
Total Bilirubin: 0.4 mg/dL (ref 0.3–1.2)
Total Protein: 7.1 g/dL (ref 6.5–8.1)

## 2019-06-27 LAB — URINALYSIS, ROUTINE W REFLEX MICROSCOPIC
Bilirubin Urine: NEGATIVE
Glucose, UA: NEGATIVE mg/dL
Hgb urine dipstick: NEGATIVE
Ketones, ur: NEGATIVE mg/dL
Leukocytes,Ua: NEGATIVE
Nitrite: NEGATIVE
Protein, ur: NEGATIVE mg/dL
Specific Gravity, Urine: <1.005 — ABNORMAL LOW (ref 1.005–1.030)
pH: 6 (ref 5.0–8.0)

## 2019-06-27 LAB — CBC WITH DIFFERENTIAL/PLATELET
Abs Immature Granulocytes: 0.02 10*3/uL (ref 0.00–0.07)
Basophils Absolute: 0 10*3/uL (ref 0.0–0.1)
Basophils Relative: 1 %
Eosinophils Absolute: 0.1 10*3/uL (ref 0.0–0.5)
Eosinophils Relative: 2 %
HCT: 45.8 % (ref 39.0–52.0)
Hemoglobin: 14.9 g/dL (ref 13.0–17.0)
Immature Granulocytes: 0 %
Lymphocytes Relative: 14 %
Lymphs Abs: 1.3 10*3/uL (ref 0.7–4.0)
MCH: 31 pg (ref 26.0–34.0)
MCHC: 32.5 g/dL (ref 30.0–36.0)
MCV: 95.2 fL (ref 80.0–100.0)
Monocytes Absolute: 0.8 10*3/uL (ref 0.1–1.0)
Monocytes Relative: 9 %
Neutro Abs: 6.7 10*3/uL (ref 1.7–7.7)
Neutrophils Relative %: 74 %
Platelets: 239 10*3/uL (ref 150–400)
RBC: 4.81 MIL/uL (ref 4.22–5.81)
RDW: 13.3 % (ref 11.5–15.5)
WBC: 8.9 10*3/uL (ref 4.0–10.5)
nRBC: 0 % (ref 0.0–0.2)

## 2019-06-27 NOTE — ED Provider Notes (Addendum)
[facility] EMERGENCY DEPARTMENT Provider Note   CSN: [id number] Arrival date & time: 06/27/19  1514    History   Chief Complaint Chief Complaint  Patient presents with  . Numbness    HPI Adam Quinn is a 63 y.o. male.     The history is provided by the patient and medical records. No language interpreter was used.   Adam Quinn is a 63 y.o. male who presents to the Emergency Department complaining of numbness. He presents to the emergency department complaining of numbness to the right periorbital region as well as the right thumb since Saturday morning. Numbness is constant in nature. Today he developed a mild left temporal headache. He denies any fevers, nausea, vomiting, weakness, vision changes. He does have a history of prior stroke that presented similarly. He takes Plavix daily. He does have an aspirin allergy. He presents today to the emergency department because he contacted his PCP, who recommended ED evaluation. Past Medical History:  Diagnosis Date  . Allergy    seasonal  . Anxiety   . Cataract    cataract surgery bilaterally resolved issues  . Central scotoma   . Chronic kidney disease    small protien showing uses Lisinopril for this  . Diverticulitis   . Diverticulosis   . DM (diabetes mellitus) ([place])   . GERD (gastroesophageal reflux disease)   . Glaucoma   . History of colon polyps   . History of hiatal hernia   . History of kidney stones    x1  . Iron deficiency anemia    Iron infusion 04/29/2019  . Neuropathy   . OSA on CPAP   . Stroke ([place])   . Wears glasses    READING    Patient Active Problem List   Diagnosis Date Noted  . Stroke ([place]) 06/27/2019  . Depression 06/27/2019  . Elevated hemoglobin ([place]) 04/05/2018  . Iron deficiency anemia due to chronic blood loss 05/26/2017  . Malabsorption of iron 05/26/2017  . HTN (hypertension) 03/30/2017  . Hyperlipemia 03/30/2017  . OSA on CPAP 03/30/2017  . Snoring 07/08/2016  .  Uncontrolled REM sleep behavior disorder 07/08/2016  . Lacunar infarct, acute ([place]) 06/26/2016  . Numbness 06/26/2016  . Occlusion and stenosis of vertebral artery 06/26/2016  . Benign neoplasm of cecum 06/04/2015  . Diverticulosis of colon without hemorrhage 06/04/2015  . Internal hemorrhoids 06/04/2015  . Gastritis and gastroduodenitis 06/04/2015  . Microcytic anemia 04/13/2015  . Esophageal reflux 10/04/2012  . Diabetes mellitus without complication ([place]) 16/60/6301  . CONSTIPATION 03/16/2008    Past Surgical History:  Procedure Laterality Date  . cataract surgery     bilateral  . COLONOSCOPY    . COLONOSCOPY N/A 06/04/2015   Procedure: COLONOSCOPY;  Surgeon: Inda Castle, MD;  Location: [facility];  Service: Endoscopy;  Laterality: N/A;  . COLONOSCOPY WITH PROPOFOL N/A 05/09/2019   Procedure: COLONOSCOPY WITH PROPOFOL;  Surgeon: Doran Stabler, MD;  Location: [facility];  Service: Gastroenterology;  Laterality: N/A;  . ESOPHAGOGASTRODUODENOSCOPY N/A 06/04/2015   Procedure: ESOPHAGOGASTRODUODENOSCOPY (EGD);  Surgeon: Inda Castle, MD;  Location: [facility];  Service: Endoscopy;  Laterality: N/A;  . UPPER GASTROINTESTINAL ENDOSCOPY    . WISDOM TOOTH EXTRACTION     with sedation        Home Medications    Prior to Admission medications   Medication Sig Start Date End Date Taking? Authorizing Provider  clopidogrel (PLAVIX) 75 MG tablet Take 1 tablet (75 mg  total) by mouth daily. Patient taking differently: Take 75 mg by mouth at bedtime.  11/05/17  Yes Dennie Bible, NP  dorzolamide (TRUSOPT) 2 % ophthalmic solution Place 1 drop into both eyes 2 (two) times a day. 03/30/19  Yes [provider]  FREESTYLE LITE test strip  06/25/17  Yes [provider]  gabapentin (NEURONTIN) 300 MG capsule Take 300 mg by mouth at bedtime.    Yes [provider]  ibuprofen (ADVIL,MOTRIN) 800 MG tablet Take 800 mg by mouth every 8 (eight) hours as  needed.   Yes [provider]  lansoprazole (PREVACID) 30 MG capsule Take 30 mg by mouth 2 (two) times daily.  09/16/18  Yes [provider]  lisinopril (PRINIVIL,ZESTRIL) 5 MG tablet Take 5 mg by mouth at bedtime.    Yes [provider]  loratadine-pseudoephedrine (CLARITIN-D 12-HOUR) 5-120 MG tablet Take 1 tablet by mouth at bedtime as needed for allergies. Alavert-D   Yes [provider]  LUMIGAN 0.01 % SOLN Place 1 drop into both eyes at bedtime. 09/01/18  Yes [provider]  Melatonin 10 MG TABS Take 10 mg by mouth at bedtime.   Yes [provider]  metFORMIN (GLUCOPHAGE-XR) 500 MG 24 hr tablet Take 2,000 mg by mouth daily with breakfast.    Yes [provider]  Probiotic Product (RA PROBIOTIC GUMMIES PO) Take 1 capsule by mouth at bedtime.   Yes [provider]  sertraline (ZOLOFT) 100 MG tablet Take 100 mg by mouth daily.  06/25/17  Yes [provider]  simvastatin (ZOCOR) 20 MG tablet Take 20 mg by mouth every evening.   Yes [provider]  sodium bicarbonate 650 MG tablet Take 1 tablet (650 mg total) by mouth 2 (two) times daily. 05/30/15  Yes Inda Castle, MD  TOUJEO SOLOSTAR 300 UNIT/ML SOPN Inject 44 Units into the skin at bedtime.  03/18/17  Yes [provider]  TRULICITY 9.45 WT/8.8EK SOPN Inject 0.75 mg into the skin every Sunday.  03/29/18  Yes [provider]    Family History Family History  Problem Relation Age of Onset  . Colon polyps Father   . Lung cancer Father   . Diabetes Mother   . Dementia Mother   . Diabetes Sister        x 2  . Stroke Maternal Grandmother   . Stroke Paternal Grandfather   . Colon cancer Neg Hx   . Rectal cancer Neg Hx   . Stomach cancer Neg Hx   . Pancreatic cancer Neg Hx     Social History Social History   Tobacco Use  . Smoking status: Former Smoker    Quit date: 10/04/1997    Years since quitting: 21.7  . Smokeless tobacco:  Never Used  Substance Use Topics  . Alcohol use: No  . Drug use: No     Allergies   Aspirin and Shrimp [shellfish allergy]   Review of Systems Review of Systems  All other systems reviewed and are negative.    Physical Exam Updated Vital Signs BP (!) 146/85   Pulse (!) 104   Temp 98 F (36.7 C) (Oral)   Resp 16   Ht 5\' 8"  (1.727 m)   Wt 86.6 kg   SpO2 97%   BMI 29.04 kg/m   Physical Exam Vitals signs and nursing note reviewed.  Constitutional:      Appearance: He is well-developed.  HENT:     Head: Normocephalic and atraumatic.  Cardiovascular:     Rate and Rhythm: Regular rhythm. Tachycardia present.     Heart sounds: No murmur.  Pulmonary:     Effort: Pulmonary effort is normal. No respiratory distress.     Breath sounds: Normal breath sounds.  Abdominal:     Palpations: Abdomen is soft.     Tenderness: There is no abdominal tenderness. There is no guarding or rebound.  Musculoskeletal:        General: No swelling or tenderness.  Skin:    General: Skin is warm and dry.  Neurological:     Mental Status: He is alert and oriented to person, place, and time.     Comments: No asymmetry of facial muscles. There is mild altered sensation to light touch in the V3 distribution. No pronated or drift. Five out of five strength in all four extremities. There is altered sensation to light touch to the right thumb. Visual fields are grossly intact.  Psychiatric:        Mood and Affect: Mood normal.        Behavior: Behavior normal.      ED Treatments / Results  Labs (all labs ordered are listed, but only abnormal results are displayed) Labs Reviewed  URINALYSIS, ROUTINE W REFLEX MICROSCOPIC - Abnormal; Notable for the following components:      Result Value   Specific Gravity, Urine <1.005 (*)    All other components within normal limits  COMPREHENSIVE METABOLIC PANEL - Abnormal; Notable for the following components:   Glucose, Bld 142 (*)    All other  components within normal limits  SARS CORONAVIRUS 2 (HOSPITAL ORDER, [place] [facility])  CBC WITH DIFFERENTIAL/PLATELET  PLATELET INHIBITION P2Y12    EKG EKG Interpretation  Date/Time:  Monday June 27 2019 16:30:21 EDT Ventricular Rate:  100 PR Interval:    QRS Duration: 82 QT Interval:  352 QTC Calculation: 452 R Axis:   21 Text Interpretation:  Sinus tachycardia Low voltage, precordial leads Confirmed by Quintella Reichert [id number]) on 06/27/2019 5:19:17 PM   Radiology Ct Head Wo Contrast  Result Date: 06/27/2019 CLINICAL DATA:  Left temporal headache today. Right hand and face numbness. EXAM: CT HEAD WITHOUT CONTRAST TECHNIQUE: Contiguous axial images were obtained from the base of the skull through the vertex without intravenous contrast. COMPARISON:  MRI head 05/22/2016 FINDINGS: Brain: Moderate atrophy. Negative for hydrocephalus. Chronic microvascular ischemic change in the white matter. Benign cyst right basal ganglia unchanged. Negative for acute infarct, hemorrhage, or mass. No fluid collection or midline shift. Vascular: Negative for hyperdense vessel Skull: Negative Sinuses/Orbits: Air-fluid level sphenoid sinus. Remaining sinuses clear. No orbital lesion. Other: None IMPRESSION: No acute abnormality. Atrophy and moderate chronic ischemic change in the white matter. Electronically Signed   By: Franchot Gallo M.D.   On: 06/27/2019 16:47   Mr Brain Wo Contrast  Result Date: 06/27/2019 CLINICAL DATA:  Numbness of the right hand and face beginning 06/25/2019. Headache today. EXAM: MRI HEAD WITHOUT CONTRAST TECHNIQUE: Multiplanar, multiecho pulse sequences of the brain and surrounding structures were obtained without intravenous contrast. COMPARISON:  Head CT same day.  MRI 05/22/2016 FINDINGS: Brain: Diffusion imaging shows a subcentimeter acute infarction in the left thalamus. No other acute infarction. Chronic small-vessel ischemic changes are present throughout the  pons. No focal cerebellar insult. Cerebral hemispheres show old small vessel infarctions of the thalami, basal ganglia and cerebral hemispheric white matter. Generalized atrophy. Scattered foci of hemosiderin deposition associated with some of the old small  vessel infarctions. No mass lesion, hydrocephalus, extra-axial collection or sign of acute hemorrhage. Vascular: Major vessels at the base of the brain show flow. Skull and upper cervical spine: Negative Sinuses/Orbits: Clear except for inflammatory changes of the sphenoid sinus. Orbits negative. Other: None IMPRESSION: Subcentimeter acute infarction in the left thalamus. Extensive chronic small-vessel ischemic changes elsewhere throughout the brain. Left sphenoid sinus inflammatory change. Electronically Signed   By: Nelson Chimes M.D.   On: 06/27/2019 21:18    Procedures Procedures (including critical care time)  Medications Ordered in ED Medications - No data to display   Initial Impression / Assessment and Plan / ED Course  I have reviewed the triage vital signs and the nursing notes.  Pertinent labs & imaging results that were available during my care of the patient were reviewed by me and considered in my medical decision making (see chart for details).       Patient with history of prior CVA here for evaluation of numbness to the right perioral region as well as his right thumb. CT scan is negative for acute CVA. Discussed with Dr. Leonel Ramsay with neurology, recommends MRI of the brain. MRI is not available at this facility, plan to transfer to [facility] for MRI. Discussed with patient and wife recommendations for transfer for further imaging and they are in agreement. Discussed with Dr. Zenia Resides, at [facility] who accepts the patient in transfer.  Final Clinical Impressions(s) / ED Diagnoses   Final diagnoses:  Paresthesia  Stroke, small vessel [facility])    ED Discharge Orders    None       Quintella Reichert, MD 06/27/19 1831     Quintella Reichert, MD 06/27/19 2310

## 2019-06-27 NOTE — ED Notes (Signed)
Pt on monitor 

## 2019-06-27 NOTE — ED Notes (Signed)
ED Charge Nurse notified of Transfer for MRI.  IV had been secured for transport.

## 2019-06-27 NOTE — Discharge Instructions (Addendum)
Follow-up with your neurologist as you have been instructed

## 2019-06-27 NOTE — Telephone Encounter (Signed)
Message received from patient requesting a call back from Judson Roch d/t he is having new numbness to the right side of his face which started on Saturday morning.  Call placed back to patient and patient notified to contact his PCP or to go to the ED now regarding new facial numbness per order of S. Arnot NP.  Pt states that he will contact his PCP now and is appreciative of call back.

## 2019-06-27 NOTE — ED Notes (Signed)
Patient transported to MRI 

## 2019-06-27 NOTE — ED Provider Notes (Signed)
Patient sent here from [facility] on MRI of brain due to paresthesias times several days in his right face as well as right hand.  His neurological assessment here shows good strength throughout all extremities.  Gait is normal.  Speech is normal.  He has no facial symmetry.  Sensation is grossly intact.  Brain MRI is pending at this time.  10:56 PM Patient seen by Dr. Londell Moh from neurology and he spoke at length with the patient and his wife.  They are comfortable going home.  Outpatient follow-up is been arranged.   Lacretia Leigh, MD 06/27/19 [phone]

## 2019-06-27 NOTE — Consult Note (Signed)
Requesting Physician: Dr. Lacretia Leigh    Chief Complaint: Right lip and mouth numbness and right hand numbness  History obtained from: Patient and Chart     HPI:                                                                                                                                       Adam Quinn is a 62 y.o. male with past medical history of diabetes mellitus, stroke, sleep apnea, neuropathy, testosterone deficiency, anemia presents to the emergency department at [facility] after waking up on Saturday with numbness around the right side of his lip and mouth as well as his right hand.  Has numbness has been constant and today he decided to present to the hospital due to concern that he may have had another stroke.  Patient was sent to [facility], ER to obtain a brain MRI.  MRI of his brain showed a small acute left thalamic infarct and neurology was consulted.    Date last known well: 06/25/2019 tPA Given: No, outside TPA window NIHSS: 0 Baseline MRS 0    Past Medical History:  Diagnosis Date  . Allergy    seasonal  . Anxiety   . Cataract    cataract surgery bilaterally resolved issues  . Central scotoma   . Chronic kidney disease    small protien showing uses Lisinopril for this  . Diverticulitis   . Diverticulosis   . DM (diabetes mellitus) ([place])   . GERD (gastroesophageal reflux disease)   . Glaucoma   . History of colon polyps   . History of hiatal hernia   . History of kidney stones    x1  . Iron deficiency anemia    Iron infusion 04/29/2019  . Neuropathy   . OSA on CPAP   . Stroke ([place])   . Wears glasses    READING    Past Surgical History:  Procedure Laterality Date  . cataract surgery     bilateral  . COLONOSCOPY    . COLONOSCOPY N/A 06/04/2015   Procedure: COLONOSCOPY;  Surgeon: Inda Castle, MD;  Location: [facility]OSCOPY;  Service: Endoscopy;  Laterality: N/A;  . COLONOSCOPY WITH PROPOFOL N/A 05/09/2019   Procedure: COLONOSCOPY WITH PROPOFOL;   Surgeon: Doran Stabler, MD;  Location: [facility]OS[facility];  Service: Gastroenterology;  Laterality: N/A;  . ESOPHAGOGASTRODUODENOSCOPY N/A 06/04/2015   Procedure: ESOPHAGOGASTRODUODENOSCOPY (EGD);  Surgeon: Inda Castle, MD;  Location: [facility];  Service: Endoscopy;  Laterality: N/A;  . UPPER GASTROINTESTINAL ENDOSCOPY    . WISDOM TOOTH EXTRACTION     with sedation    Family History  Problem Relation Age of Onset  . Colon polyps Father   . Lung cancer Father   . Diabetes Mother   . Dementia Mother   . Diabetes Sister        x 2  . Stroke Maternal Grandmother   .  Stroke Paternal Grandfather   . Colon cancer Neg Hx   . Rectal cancer Neg Hx   . Stomach cancer Neg Hx   . Pancreatic cancer Neg Hx    Social History:  reports that he quit smoking about 21 years ago. He has never used smokeless tobacco. He reports that he does not drink alcohol or use drugs.  Allergies:  Allergies  Allergen Reactions  . Aspirin Hives  . Shrimp [Shellfish Allergy] Hives    Just shrimp     Medications:                                                                                                                        I reviewed home medications   ROS:                                                                                                                                     14 systems reviewed and negative except above    Examination:                                                                                                      General: Appears well-developed Psych: Affect appropriate to situation Eyes: No scleral injection HENT: No OP obstrucion Head: Normocephalic.  Cardiovascular: Normal rate and regular rhythm.  Respiratory: Effort normal and breath sounds normal to anterior ascultation GI: Soft.  No distension. There is no tenderness.  Skin: WDI    Neurological Examination Mental Status: Alert, oriented, thought content appropriate.  Speech fluent without  evidence of aphasia. Able to follow 3 step commands without difficulty. Cranial Nerves: II: Visual fields grossly normal,  III,IV, VI: ptosis not present, extra-ocular motions intact bilaterally, pupils equal, round, reactive to light and accommodation V,VII: smile symmetric, facial light touch sensation normal bilaterally VIII: hearing normal bilaterally IX,X: uvula rises symmetrically XI: bilateral shoulder shrug XII: midline tongue extension Motor: Right : Upper extremity   5/5    Left:  Upper extremity   5/5  Lower extremity   5/5     Lower extremity   5/5 Tone and bulk:normal tone throughout; no atrophy noted Sensory: Pinprick and light touch intact throughout, bilaterally, subjective paresthesia over right side of lip and right thumb Plantars: Right: downgoing   Left: downgoing Cerebellar: normal finger-to-nose, normal rapid alternating movements and normal heel-to-shin test Gait: normal gait and station     Lab Results: Basic Metabolic Panel: Recent Labs  Lab 06/27/19 1632  NA 139  K 4.7  CL 104  CO2 24  GLUCOSE 142*  BUN 14  CREATININE 0.92  CALCIUM 9.6    CBC: Recent Labs  Lab 06/27/19 1632  WBC 8.9  NEUTROABS 6.7  HGB 14.9  HCT 45.8  MCV 95.2  PLT 239    Coagulation Studies: No results for input(s): LABPROT, INR in the last 72 hours.  Imaging: Ct Head Wo Contrast  Result Date: 06/27/2019 CLINICAL DATA:  Left temporal headache today. Right hand and face numbness. EXAM: CT HEAD WITHOUT CONTRAST TECHNIQUE: Contiguous axial images were obtained from the base of the skull through the vertex without intravenous contrast. COMPARISON:  MRI head 05/22/2016 FINDINGS: Brain: Moderate atrophy. Negative for hydrocephalus. Chronic microvascular ischemic change in the white matter. Benign cyst right basal ganglia unchanged. Negative for acute infarct, hemorrhage, or mass. No fluid collection or midline shift. Vascular: Negative for hyperdense vessel Skull: Negative  Sinuses/Orbits: Air-fluid level sphenoid sinus. Remaining sinuses clear. No orbital lesion. Other: None IMPRESSION: No acute abnormality. Atrophy and moderate chronic ischemic change in the white matter. Electronically Signed   By: Franchot Gallo M.D.   On: 06/27/2019 16:47   Mr Brain Wo Contrast  Result Date: 06/27/2019 CLINICAL DATA:  Numbness of the right hand and face beginning 06/25/2019. Headache today. EXAM: MRI HEAD WITHOUT CONTRAST TECHNIQUE: Multiplanar, multiecho pulse sequences of the brain and surrounding structures were obtained without intravenous contrast. COMPARISON:  Head CT same day.  MRI 05/22/2016 FINDINGS: Brain: Diffusion imaging shows a subcentimeter acute infarction in the left thalamus. No other acute infarction. Chronic small-vessel ischemic changes are present throughout the pons. No focal cerebellar insult. Cerebral hemispheres show old small vessel infarctions of the thalami, basal ganglia and cerebral hemispheric white matter. Generalized atrophy. Scattered foci of hemosiderin deposition associated with some of the old small vessel infarctions. No mass lesion, hydrocephalus, extra-axial collection or sign of acute hemorrhage. Vascular: Major vessels at the base of the brain show flow. Skull and upper cervical spine: Negative Sinuses/Orbits: Clear except for inflammatory changes of the sphenoid sinus. Orbits negative. Other: None IMPRESSION: Subcentimeter acute infarction in the left thalamus. Extensive chronic small-vessel ischemic changes elsewhere throughout the brain. Left sphenoid sinus inflammatory change. Electronically Signed   By: Nelson Chimes M.D.   On: 06/27/2019 21:18     ASSESSMENT AND PLAN   63 y.o. male with past medical history of diabetes mellitus, stroke, sleep apnea, neuropathy, testosterone deficiency, anemia presents to the emergency department for right perioral and hand paresthesia that patient noticed on waking up Saturday morning. MRI confirms small  left thalamic infarct.  Patient is not a candidate for IV TPA as he is outside the window.   We had discussed about admission for further stroke work-up, however after discussion with patient and his wife ( who is a Marine scientist at [employer]) they preferred to have work-up done as outpatient given his multiple underlying comorbidities and risk of contracting COVID-19 during hospitalization.  Etiology of his stroke is from  small vessel disease based on the location of infarction.  While it would be ideal to repeat vascular imaging and an echocardiogram, I do not think obtaining these tests as an inpatient will change management signficantly, as location of infarction is very typical for small vessel disease.  He is also outside the initial 24-hour window, and his blood pressure appears to be stable in the emergency room.  I did discuss in detail regarding risk factor modification and opportunities to optimize his current regimen.  While I did discuss about dual antiplatelet therapy with aspirin and Plavix, unfortunately patient is allergic to aspirin (listed as hives, however wife states he had a anaphylactic reaction and this is not an option).  That rules out Aggrenox (aspirin dipyrimadole combination) and would leave [place] with Brilinta (Ticagrelor) if a change to his platelet regimen needs to be changed.  I do think it is worthwhile to obtain a P2 Y1 2 level first, to see if Plavix is therapeutic before a change is made.  There also other significant risk factors that need to be addressed including testosterone replacement which is pro thrombogenic, optimization of his diabetic control (he had couple of  readings of greater than 200 random blood sugar in the last year on chart review in epic).  Per his wife, his LDL has been under good control.  He is on a low-dose simvastatin 20 mg due to muscle cramps. Currently does not have any muscle cramping (after taking Co-Q10) and so there may be room to improve if his LDL is  greater than 70.  He certainly needs to work on his weight, diet, exercise which all have been suboptimal.  Recommendations -Close outpatient follow-up with Dr. Leonie Man -Follow-up  P2 Y12  Levels, continue Plavix for now -Outpatient carotid ultrasound or MR Angiogram  - F/U AIC, lipid profile -Establish care with endocrinologist for better diabetic management and testosterone deficiency    Ayda Tancredi [facility] Pager Number [phone]

## 2019-06-27 NOTE — ED Notes (Signed)
Patient transported to CT 

## 2019-06-27 NOTE — ED Triage Notes (Signed)
Pt c/o numbness to right hand, right side of lips and right side of face that started when he woke 8/1-c/o HA x today-NAD-steady gait

## 2019-08-04 ENCOUNTER — Ambulatory Visit: Payer: 59 | Admitting: Neurology

## 2019-08-04 ENCOUNTER — Encounter: Payer: Self-pay | Admitting: Neurology

## 2019-08-04 ENCOUNTER — Other Ambulatory Visit: Payer: Self-pay

## 2019-08-04 VITALS — BP 134/81 | HR 92 | Temp 97.3°F | Wt 189.6 lb

## 2019-08-04 DIAGNOSIS — I6381 Other cerebral infarction due to occlusion or stenosis of small artery: Secondary | ICD-10-CM | POA: Diagnosis not present

## 2019-08-04 NOTE — Progress Notes (Signed)
[facility]  PATIENT: Adam Quinn DOB: [birth year]   REASON FOR VISIT: Follow-up for history of stroke the diagnosis of obstructive sleep apnea with CPAP HISTORY FROM: Patient    HISTORY OF PRESENT ILLNESS:Initial Consult 06/26/2016 ;  Adam Quinn is a 52 year Caucasian male who developed sudden onset of left face and neck and arm pain and paresthesias on April 15 while lifting boxes when he was mowing his house. He describes a feeling of sensation of on the top of his head and face. this lasted for a couple of weeks and gradually improved. he did not seek immediate help but eventually size primary physician. tender mri scan cervical spine which i have reviewed which showed mild foraminal degenerative changes without significant compression. he was referred to dr. 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 the left vertebral artery flow void was absent in the right vertebral artery was dominant and from the basilar artery. patient has no significant vascular risk factors except diabetes which appears to be well controlled on last hemoglobin a1c a few months ago was 6.7. he has no history of hypertension. he has been started on statins years ago for more for primary prevention  As part of a studybut has no known history of cardiac disease or strokes. he does not smoke. he does have however family history is multiple family members on both sides having strokes. he is allergic to aspirin. patient does have history of snoring and restless sleep as well as he feels significantly tired upon awakening. he has not yet had a sleep study. Update 09/30/2016 ; Patient returns for follow-up after last 3 months ago. He states is doing well and has not had any recurrent  stroke or TIA symptoms. 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 Patient feels as a code fully advanced no deficits. Patient has not been taking Plavix as is concerned about possible interaction with omeprazole which is taking and was pointed out to him by his brother-in-law. Patient states his tried Pepcid before but did not work for him. I advised him to discuss with his primary physician  Patient is allergic to aspirin and cannot be switch to aspirin. He did undergo polysomnogram and was diagnosed with sleep apnea. He has been fitted with a CPAP mask but has not yet received that and plans to start it soon. He was seen by Dr. Brett Fairy for sleep apnea as well. UPDATE 03/30/17 CM Adam. 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 the left vertebral artery flow void was absent in the right vertebral artery was dominant and from the basilar artery. The left vertebral artery is small size likely from birth. The patient has not had further stroke or TIA symptoms The patient tells me today he has never started the Plavix because his wife who is an RN told him if he ever had surgery he would have to be off the medication for several days. Patient has an allergy to aspirin. Patient is diabetic  and claims his fasting sugars are between 120-170. Blood pressure in the office today 146/72 he claims he is compliant with his blood pressure medications. Patient was recently diagnosed with obstructive sleep apnea and is now on CPAP. He claims his fatigue is much less. He has not had a follow-up with his sleep doctor.  He returns for reevaluation Update 08/04/2019 : Patient is seen for follow-up today after last visit with  [name] nurse practitioner in May 2018.  He was seen on on 06/27/2019 to [facility] in the emergency room with sudden onset of right perioral and hand paresthesias and MRI scan showed left thalamic lacunar infarct.  He was not hospitalized and recommended outpatient evaluation.  Patient is allergic to aspirin hence he was continued on Plavix as there were no other worthwhile alternatives in terms of antiplatelet agents that he could use.  His blood pressure remains well controlled today it is 134/81.  He is tolerating Zocor well without muscle aches and pains.  He states his paresthesias appear to have improved but he still has mild tingling and numbness around his right upper lip as well as fingertips of the right hand.  He is compliant with using his CPAP every night for his sleep apnea.  He knows he needs to eat healthy and lose weight but he has not been practicing proper diet yet. REVIEW OF SYSTEMS: Full 14 system review of systems performed and notable only for those listed, all others are neg: Numbness, tingling, sleep apnea   ALLERGIES: Allergies  Allergen Reactions   Aspirin Hives   Shrimp [Shellfish Allergy] Hives    Just shrimp     HOME MEDICATIONS: Outpatient Medications Prior to Visit  Medication Sig Dispense Refill   clopidogrel (PLAVIX) 75 MG tablet Take 1 tablet (75 mg total) by mouth daily. (Patient taking differently: Take 75 mg by mouth at bedtime. ) 90 tablet 0   dorzolamide (TRUSOPT) 2 % ophthalmic solution Place 1 drop into both eyes 2 (two) times a day.     FREESTYLE LITE test strip   3   gabapentin (NEURONTIN) 300 MG capsule Take 300 mg by mouth at bedtime.      ibuprofen (ADVIL,MOTRIN) 800 MG tablet Take 800 mg by mouth every 8 (eight) hours as needed.     lansoprazole (PREVACID) 30 MG capsule Take 30 mg by mouth 2 (two) times daily.   1   lisinopril (PRINIVIL,ZESTRIL) 5 MG tablet Take 5 mg by mouth at bedtime.      loratadine-pseudoephedrine  (CLARITIN-D 12-HOUR) 5-120 MG tablet Take 1 tablet by mouth at bedtime as needed for allergies. Alavert-D     LUMIGAN 0.01 % SOLN Place 1 drop into both eyes at bedtime.  3   Melatonin 10 MG TABS Take 10 mg by mouth at bedtime.     metFORMIN (GLUCOPHAGE-XR) 500 MG 24 hr tablet Take 2,000 mg by mouth daily with breakfast.      Probiotic Product (RA PROBIOTIC GUMMIES PO) Take 1 capsule by mouth at bedtime.     sertraline (ZOLOFT) 100 MG tablet Take 100 mg by mouth daily.   1   simvastatin (ZOCOR) 20 MG tablet Take 20 mg by mouth every evening.     sodium bicarbonate 650 MG tablet Take 1 tablet (650 mg total) by mouth 2 (two) times daily. 60 tablet 3   TOUJEO SOLOSTAR 300 UNIT/ML SOPN Inject 44 Units into the skin at bedtime.   1   TRULICITY [id number] 0000000 SOPN  Inject 0.75 mg into the skin every Sunday.   5   No facility-administered medications prior to visit.     PAST MEDICAL HISTORY: Past Medical History:  Diagnosis Date   Allergy    seasonal   Anxiety    Cataract    cataract surgery bilaterally resolved issues   Central scotoma    Chronic kidney disease    small protien showing uses Lisinopril for this   Diverticulitis    Diverticulosis    DM (diabetes mellitus) ([place])    GERD (gastroesophageal reflux disease)    Glaucoma    History of colon polyps    History of hiatal hernia    History of kidney stones    x1   Iron deficiency anemia    Iron infusion 04/29/2019   Neuropathy    OSA on CPAP    Stroke ([place])    Wears glasses    READING    PAST SURGICAL HISTORY: Past Surgical History:  Procedure Laterality Date   cataract surgery     bilateral   COLONOSCOPY     COLONOSCOPY N/A 06/04/2015   Procedure: COLONOSCOPY;  Surgeon: Inda Castle, MD;  Location: [facility] ENDOSCOPY;  Service: Endoscopy;  Laterality: N/A;   COLONOSCOPY WITH PROPOFOL N/A 05/09/2019   Procedure: COLONOSCOPY WITH PROPOFOL;  Surgeon: Doran Stabler, MD;  Location: [facility]  ENDOSCOPY;  Service: Gastroenterology;  Laterality: N/A;   ESOPHAGOGASTRODUODENOSCOPY N/A 06/04/2015   Procedure: ESOPHAGOGASTRODUODENOSCOPY (EGD);  Surgeon: Inda Castle, MD;  Location: [facility];  Service: Endoscopy;  Laterality: N/A;   UPPER GASTROINTESTINAL ENDOSCOPY     WISDOM TOOTH EXTRACTION     with sedation    FAMILY HISTORY: Family History  Problem Relation Age of Onset   Colon polyps Father    Lung cancer Father    Diabetes Mother    Dementia Mother    Diabetes Sister        x 2   Stroke Maternal Grandmother    Stroke Paternal Grandfather    Colon cancer Neg Hx    Rectal cancer Neg Hx    Stomach cancer Neg Hx    Pancreatic cancer Neg Hx     SOCIAL HISTORY: Social History   Socioeconomic History   Marital status: Married    Spouse name: Not on file   Number of children: 3   Years of education: Not on file   Highest education level: Not on file  Occupational History   Occupation: Optometrist  Social Needs   Financial resource strain: Not on file   Food insecurity    Worry: Not on file    Inability: Not on file   Transportation needs    Medical: Not on file    Non-medical: Not on file  Tobacco Use   Smoking status: Former Smoker    Quit date: 10/04/1997    Years since quitting: 21.8   Smokeless tobacco: Never Used  Substance and Sexual Activity   Alcohol use: No   Drug use: No   Sexual activity: Not on file  Lifestyle   Physical activity    Days per week: Not on file    Minutes per session: Not on file   Stress: Not on file  Relationships   Social connections    Talks on phone: Not on file    Gets together: Not on file    Attends religious service: Not on file    Active member of club or organization: Not on file  Attends meetings of clubs or organizations: Not on file    Relationship status: Not on file   Intimate partner violence    Fear of current or ex partner: Not on file    Emotionally abused: Not  on file    Physically abused: Not on file    Forced sexual activity: Not on file  Other Topics Concern   Not on file  Social History Narrative   Not on file     PHYSICAL EXAM  Vitals:   08/04/19 0845  BP: 134/81  Pulse: 92  Temp: (!) 97.3 F (36.3 C)  Weight: 86 kg   Body mass index is 28.83 kg/m.  Generalized: Obese middle-aged Caucasian male in no acute distress  Head: normocephalic and atraumatic,.    Neck: Supple, no carotid bruits  Cardiac: Regular rate rhythm, no murmur  Musculoskeletal: No deformity   Neurological examination   Mentation: Alert oriented to time, place,   Attention span and concentration appropriate. Recent and remote memory intact.  Follows all commands speech and language fluent.   Cranial nerve II-XII: Pupils were equal round reactive to light extraocular movements were full, visual field were full on confrontational test. Facial sensation and strength were normal. hearing was intact to finger rubbing bilaterally. Uvula tongue midline. head turning and shoulder shrug were normal and symmetric.Tongue protrusion into cheek strength was normal. Motor: normal bulk and tone, full strength in the BUE, BLE, Sensory: normal and symmetric to light touch, in the upper and lower extremities Coordination: finger-nose-finger, heel-to-shin bilaterally, no dysmetria Reflexes: 1+ upper lower and symmetric, plantar responses were flexor bilaterally. Gait and Station: Rising up from seated position without assistance, normal stance,  moderate stride, good arm swing, smooth turning, able to perform tiptoe, and heel walking without difficulty. Tandem gait is performed with moderate difficulty  DIAGNOSTIC DATA (LABS, IMAGING, TESTING) - I reviewed patient records, labs, notes, testing and imaging myself where available.      Component Value Date/Time   NA 139 06/27/2019 1632   NA 140 05/26/2017 0856   K 4.7 06/27/2019 1632   K 4.2 05/26/2017 0856   CL 104  06/27/2019 1632   CO2 24 06/27/2019 1632   CO2 25 05/26/2017 0856   GLUCOSE 142 (H) 06/27/2019 1632   GLUCOSE 175 (H) 05/26/2017 0856   BUN 14 06/27/2019 1632   BUN 12.7 05/26/2017 0856   CREATININE 0.92 06/27/2019 1632   CREATININE 1.11 04/22/2019 1331   CREATININE 1.2 05/26/2017 0856   CALCIUM 9.6 06/27/2019 1632   CALCIUM 9.5 05/26/2017 0856   PROT 7.1 06/27/2019 1632   PROT 6.9 05/26/2017 0856   ALBUMIN 4.1 06/27/2019 1632   ALBUMIN 3.7 05/26/2017 0856   AST 15 06/27/2019 1632   AST 11 (L) 04/22/2019 1331   AST 16 05/26/2017 0856   ALT 20 06/27/2019 1632   ALT 17 04/22/2019 1331   ALT 22 05/26/2017 0856   ALKPHOS 95 06/27/2019 1632   ALKPHOS 83 05/26/2017 0856   BILITOT 0.4 06/27/2019 1632   BILITOT 0.3 04/22/2019 1331   BILITOT 0.28 05/26/2017 0856   GFRNONAA >60 06/27/2019 1632   GFRNONAA >60 04/22/2019 1331   GFRAA >60 06/27/2019 1632   GFRAA >60 04/22/2019 1331      ASSESSMENT AND PLAN 2 year Caucasian male with episode of left face and arm pain and numbness likely secondary to a small brainstem infarct. MRI scan of the brain 6/2017shows multiple bilateral subcortical lacunar infarcts. Etiology unclear as to small vessel disease  or left vertebral  artery occlusion.  Recent left thalamic infarct in August 2020 due to small vessel disease with residual right-sided numbness.  Vascular risk factors of diabetes, hypertension, hyperlipidemia and sleep apnea  PLAN: I had a long d/w patient and his wife about his recent lacunar stroke, risk for recurrent stroke/TIAs, personally independently reviewed imaging studies and stroke evaluation results and answered questions.Continue Plavix for secondary stroke prevention and maintain strict control of hypertension with blood pressure goal below 130/90, diabetes with hemoglobin A1c goal below 6.5% and lipids with LDL cholesterol goal below 70 mg/dL. I also advised the patient to eat a healthy diet with plenty of whole grains, cereals,  fruits and vegetables, exercise regularly and maintain ideal body weight.  Greater than 50% time during this 30-minute follow-up visit was spent on counseling and coordination of care about his lacunar stroke and paresthesias and answering questions followup in the future with my nurse practitioner Janett Billow in 3 months or call earlier if necessary. Antony Contras, MD [facility] [address] [place], [place] [address] [phone]

## 2019-08-04 NOTE — Patient Instructions (Signed)
I had a long d/w patient and his wife about his recent lacunar stroke, risk for recurrent stroke/TIAs, personally independently reviewed imaging studies and stroke evaluation results and answered questions.Continue Plavix for secondary stroke prevention and maintain strict control of hypertension with blood pressure goal below 130/90, diabetes with hemoglobin A1c goal below 6.5% and lipids with LDL cholesterol goal below 70 mg/dL. I also advised the patient to eat a healthy diet with plenty of whole grains, cereals, fruits and vegetables, exercise regularly and maintain ideal body weight Followup in the future with my nurse practitioner Janett Billow in 3 months or call earlier if necessary.  Stroke Prevention Some medical conditions and behaviors are associated with a higher chance of having a stroke. You can help prevent a stroke by making nutrition, lifestyle, and other changes, including managing any medical conditions you may have. What nutrition changes can be made?   Eat healthy foods. You can do this by: ? Choosing foods high in fiber, such as fresh fruits and vegetables and whole grains. ? Eating at least 5 or more servings of fruits and vegetables a day. Try to fill half of your plate at each meal with fruits and vegetables. ? Choosing lean protein foods, such as lean cuts of meat, poultry without skin, fish, tofu, beans, and nuts. ? Eating low-fat dairy products. ? Avoiding foods that are high in salt (sodium). This can help lower blood pressure. ? Avoiding foods that have saturated fat, trans fat, and cholesterol. This can help prevent high cholesterol. ? Avoiding processed and premade foods.  Follow your health care provider's specific guidelines for losing weight, controlling high blood pressure (hypertension), lowering high cholesterol, and managing diabetes. These may include: ? Reducing your daily calorie intake. ? Limiting your daily sodium intake to 1,500 milligrams (mg). ? Using only  healthy fats for cooking, such as olive oil, canola oil, or sunflower oil. ? Counting your daily carbohydrate intake. What lifestyle changes can be made?  Maintain a healthy weight. Talk to your health care provider about your ideal weight.  Get at least 30 minutes of moderate physical activity at least 5 days a week. Moderate activity includes brisk walking, biking, and swimming.  Do not use any products that contain nicotine or tobacco, such as cigarettes and e-cigarettes. If you need help quitting, ask your health care provider. It may also be helpful to avoid exposure to secondhand smoke.  Limit alcohol intake to no more than 1 drink a day for nonpregnant women and 2 drinks a day for men. One drink equals 12 oz of beer, 5 oz of wine, or 1 oz of hard liquor.  Stop any illegal drug use.  Avoid taking birth control pills. Talk to your health care provider about the risks of taking birth control pills if: ? You are over 69 years old. ? You smoke. ? You get migraines. ? You have ever had a blood clot. What other changes can be made?  Manage your cholesterol levels. ? Eating a healthy diet is important for preventing high cholesterol. If cholesterol cannot be managed through diet alone, you may also need to take medicines. ? Take any prescribed medicines to control your cholesterol as told by your health care provider.  Manage your diabetes. ? Eating a healthy diet and exercising regularly are important parts of managing your blood sugar. If your blood sugar cannot be managed through diet and exercise, you may need to take medicines. ? Take any prescribed medicines to control your diabetes as  told by your health care provider.  Control your hypertension. ? To reduce your risk of stroke, try to keep your blood pressure below 130/80. ? Eating a healthy diet and exercising regularly are an important part of controlling your blood pressure. If your blood pressure cannot be managed through  diet and exercise, you may need to take medicines. ? Take any prescribed medicines to control hypertension as told by your health care provider. ? Ask your health care provider if you should monitor your blood pressure at home. ? Have your blood pressure checked every year, even if your blood pressure is normal. Blood pressure increases with age and some medical conditions.  Get evaluated for sleep disorders (sleep apnea). Talk to your health care provider about getting a sleep evaluation if you snore a lot or have excessive sleepiness.  Take over-the-counter and prescription medicines only as told by your health care provider. Aspirin or blood thinners (antiplatelets or anticoagulants) may be recommended to reduce your risk of forming blood clots that can lead to stroke.  Make sure that any other medical conditions you have, such as atrial fibrillation or atherosclerosis, are managed. What are the warning signs of a stroke? The warning signs of a stroke can be easily remembered as BEFAST.  B is for balance. Signs include: ? Dizziness. ? Loss of balance or coordination. ? Sudden trouble walking.  E is for eyes. Signs include: ? A sudden change in vision. ? Trouble seeing.  F is for face. Signs include: ? Sudden weakness or numbness of the face. ? The face or eyelid drooping to one side.  A is for arms. Signs include: ? Sudden weakness or numbness of the arm, usually on one side of the body.  S is for speech. Signs include: ? Trouble speaking (aphasia). ? Trouble understanding.  T is for time. ? These symptoms may represent a serious problem that is an emergency. Do not wait to see if the symptoms will go away. Get medical help right away. Call your local emergency services (911 in the [place] Do not drive yourself to the hospital.  Other signs of stroke may include: ? A sudden, severe headache with no known cause. ? Nausea or vomiting. ? Seizure. Where to find more information  For more information, visit:  American Stroke Association: www.strokeassociation.org  National Stroke Association: www.stroke.org Summary  You can prevent a stroke by eating healthy, exercising, not smoking, limiting alcohol intake, and managing any medical conditions you may have.  Do not use any products that contain nicotine or tobacco, such as cigarettes and e-cigarettes. If you need help quitting, ask your health care provider. It may also be helpful to avoid exposure to secondhand smoke.  Remember BEFAST for warning signs of stroke. Get help right away if you or a loved one has any of these signs. This information is not intended to replace advice given to you by your health care provider. Make sure you discuss any questions you have with your health care provider. Document Released: 12/18/2004 Document Revised: 10/23/2017 Document Reviewed: 12/16/2016 Elsevier Patient Education  2020 [employer].

## 2019-08-05 LAB — LIPID PANEL
Chol/HDL Ratio: 2.3 ratio (ref 0.0–5.0)
Cholesterol, Total: 120 mg/dL (ref 100–199)
HDL: 53 mg/dL (ref >39)
LDL Chol Calc (NIH): 51 mg/dL (ref 0–99)
Triglycerides: 82 mg/dL (ref 0–149)
VLDL Cholesterol Cal: 16 mg/dL (ref 5–40)

## 2019-08-05 LAB — HEMOGLOBIN A1C
Est. average glucose Bld gHb Est-mCnc: 143 mg/dL
Hgb A1c MFr Bld: 6.6 % — ABNORMAL HIGH (ref 4.8–5.6)

## 2019-08-09 ENCOUNTER — Telehealth: Payer: Self-pay

## 2019-08-09 NOTE — Telephone Encounter (Signed)
I called pt that his lipid panel was satisfactory. Screening for diabetes is slightly high and to see PCP to discuss management. Pt stated his Hgb a1c in May was 8 and his PCP wants it at 6. Pt is currently on diabetic medication. I stated a copy will be fax to her. The pt verbalized understanding.  ------

## 2019-08-09 NOTE — Telephone Encounter (Signed)
Lab work fax to [employer] at [phone]. Fax confirmed and receive.

## 2019-08-17 ENCOUNTER — Ambulatory Visit (HOSPITAL_BASED_OUTPATIENT_CLINIC_OR_DEPARTMENT_OTHER)
Admission: RE | Admit: 2019-08-17 | Discharge: 2019-08-17 | Disposition: A | Discharge status: Home / Self Care | Payer: 59 | Source: Ambulatory Visit | Attending: Neurology | Admitting: Neurology

## 2019-08-17 ENCOUNTER — Other Ambulatory Visit: Payer: Self-pay

## 2019-08-17 ENCOUNTER — Ambulatory Visit (HOSPITAL_COMMUNITY)
Admission: RE | Admit: 2019-08-17 | Discharge: 2019-08-17 | Disposition: A | Discharge status: Home / Self Care | Payer: 59 | Source: Ambulatory Visit | Attending: Neurology | Admitting: Neurology

## 2019-08-17 DIAGNOSIS — I6381 Other cerebral infarction due to occlusion or stenosis of small artery: Secondary | ICD-10-CM

## 2019-08-25 DIAGNOSIS — E291 Testicular hypofunction: Secondary | ICD-10-CM | POA: Diagnosis not present

## 2019-08-25 DIAGNOSIS — Z0000 Encounter for general adult medical examination without abnormal findings: Secondary | ICD-10-CM | POA: Diagnosis not present

## 2019-08-25 DIAGNOSIS — F39 Unspecified mood [affective] disorder: Secondary | ICD-10-CM | POA: Diagnosis not present

## 2019-08-25 DIAGNOSIS — I6930 Unspecified sequelae of cerebral infarction: Secondary | ICD-10-CM | POA: Diagnosis not present

## 2019-08-25 DIAGNOSIS — E785 Hyperlipidemia, unspecified: Secondary | ICD-10-CM | POA: Diagnosis not present

## 2019-08-25 DIAGNOSIS — J309 Allergic rhinitis, unspecified: Secondary | ICD-10-CM | POA: Diagnosis not present

## 2019-08-25 DIAGNOSIS — Z23 Encounter for immunization: Secondary | ICD-10-CM | POA: Diagnosis not present

## 2019-08-25 DIAGNOSIS — E1121 Type 2 diabetes mellitus with diabetic nephropathy: Secondary | ICD-10-CM | POA: Diagnosis not present

## 2019-08-25 DIAGNOSIS — N1831 Chronic kidney disease, stage 3a: Secondary | ICD-10-CM | POA: Diagnosis not present

## 2019-08-25 DIAGNOSIS — N5201 Erectile dysfunction due to arterial insufficiency: Secondary | ICD-10-CM | POA: Diagnosis not present

## 2019-08-29 DIAGNOSIS — H35033 Hypertensive retinopathy, bilateral: Secondary | ICD-10-CM | POA: Diagnosis not present

## 2019-08-29 DIAGNOSIS — H40053 Ocular hypertension, bilateral: Secondary | ICD-10-CM | POA: Diagnosis not present

## 2019-08-29 DIAGNOSIS — H401132 Primary open-angle glaucoma, bilateral, moderate stage: Secondary | ICD-10-CM | POA: Diagnosis not present

## 2019-08-29 DIAGNOSIS — E113293 Type 2 diabetes mellitus with mild nonproliferative diabetic retinopathy without macular edema, bilateral: Secondary | ICD-10-CM | POA: Diagnosis not present

## 2019-10-25 ENCOUNTER — Inpatient Hospital Stay: Payer: 59 | Attending: Family | Admitting: Family

## 2019-10-25 ENCOUNTER — Telehealth: Payer: Self-pay | Admitting: Family

## 2019-10-25 ENCOUNTER — Inpatient Hospital Stay: Payer: 59

## 2019-10-25 ENCOUNTER — Other Ambulatory Visit: Payer: Self-pay

## 2019-10-25 ENCOUNTER — Encounter: Payer: Self-pay | Admitting: Family

## 2019-10-25 DIAGNOSIS — D500 Iron deficiency anemia secondary to blood loss (chronic): Secondary | ICD-10-CM

## 2019-10-25 DIAGNOSIS — D508 Other iron deficiency anemias: Secondary | ICD-10-CM

## 2019-10-25 DIAGNOSIS — K909 Intestinal malabsorption, unspecified: Secondary | ICD-10-CM | POA: Diagnosis not present

## 2019-10-25 DIAGNOSIS — E611 Iron deficiency: Secondary | ICD-10-CM | POA: Diagnosis not present

## 2019-10-25 LAB — CMP (CANCER CENTER ONLY)
ALT: 15 U/L (ref 0–44)
AST: 13 U/L — ABNORMAL LOW (ref 15–41)
Albumin: 4.7 g/dL (ref 3.5–5.0)
Alkaline Phosphatase: 83 U/L (ref 38–126)
Anion gap: 8 (ref 5–15)
BUN: 15 mg/dL (ref 8–23)
CO2: 30 mmol/L (ref 22–32)
Calcium: 10 mg/dL (ref 8.9–10.3)
Chloride: 105 mmol/L (ref 98–111)
Creatinine: 1.09 mg/dL (ref 0.61–1.24)
GFR, Est AFR Am: >60 mL/min (ref >60)
GFR, Estimated: >60 mL/min (ref >60)
Glucose, Bld: 78 mg/dL (ref 70–99)
Potassium: 4.5 mmol/L (ref 3.5–5.1)
Sodium: 143 mmol/L (ref 135–145)
Total Bilirubin: 0.5 mg/dL (ref 0.3–1.2)
Total Protein: 7.3 g/dL (ref 6.5–8.1)

## 2019-10-25 LAB — CBC WITH DIFFERENTIAL (CANCER CENTER ONLY)
Abs Immature Granulocytes: 0.03 10*3/uL (ref 0.00–0.07)
Basophils Absolute: 0.1 10*3/uL (ref 0.0–0.1)
Basophils Relative: 1 %
Eosinophils Absolute: 0.1 10*3/uL (ref 0.0–0.5)
Eosinophils Relative: 2 %
HCT: 46.8 % (ref 39.0–52.0)
Hemoglobin: 15.9 g/dL (ref 13.0–17.0)
Immature Granulocytes: 1 %
Lymphocytes Relative: 20 %
Lymphs Abs: 1.3 10*3/uL (ref 0.7–4.0)
MCH: 31.9 pg (ref 26.0–34.0)
MCHC: 34 g/dL (ref 30.0–36.0)
MCV: 93.8 fL (ref 80.0–100.0)
Monocytes Absolute: 0.5 10*3/uL (ref 0.1–1.0)
Monocytes Relative: 9 %
Neutro Abs: 4.4 10*3/uL (ref 1.7–7.7)
Neutrophils Relative %: 67 %
Platelet Count: 258 10*3/uL (ref 150–400)
RBC: 4.99 MIL/uL (ref 4.22–5.81)
RDW: 13.2 % (ref 11.5–15.5)
WBC Count: 6.4 10*3/uL (ref 4.0–10.5)
nRBC: 0 % (ref 0.0–0.2)

## 2019-10-25 LAB — IRON AND TIBC
Iron: 107 ug/dL (ref 42–163)
Saturation Ratios: 32 % (ref 20–55)
TIBC: 331 ug/dL (ref 202–409)
UIBC: 224 ug/dL (ref 117–376)

## 2019-10-25 LAB — FERRITIN: Ferritin: 223 ng/mL (ref 24–336)

## 2019-10-25 NOTE — Progress Notes (Signed)
Hematology and Oncology Follow Up Visit  Adam Quinn EX:[id number] 1956/05/23 63 y.o. 10/25/2019   Principle Diagnosis:  Iron deficiency anemia Erythropoietin deficiency anemia  Current Therapy:   IV iron as indicated   Interim History:  [name]  Had a telephone visit today for follow-up. 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 He still has some numbness around the mouth and in the left hand. He follows up with Dr. Clydene Fake APP on 12/10.  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  He has some fatigue at times. 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  He has had no issues with bleeding. No bruising or petechiae.  No fever, chills, n/v, cough, rash, dizziness, SOB, chest pain, palpitations, abdominal pain or changes in bowel or bladder habits.  No swelling or tenderness in his extremities. His appetite comes and goes with Trulicity. He states that his last Hgb A1c was 6.6.  He is staying well hydrated and his weight is stable.   ECOG Performance Status: 1 - Symptomatic but completely ambulatory  Medications:  Allergies as of 10/25/2019      Reactions   Aspirin Hives   Shrimp [shellfish Allergy] Hives   Just shrimp      Medication List       Accurate as of October 25, 2019  8:52 AM. If you have any questions, ask your nurse or doctor.        clopidogrel 75 MG tablet Commonly known as: PLAVIX Take 1 tablet (75 mg total) by mouth daily. What changed: when to take this   dorzolamide 2 % ophthalmic solution Commonly known as: TRUSOPT Place 1 drop into both eyes 2 (two) times a day.   FREESTYLE LITE test strip Generic drug: glucose blood   gabapentin 300 MG capsule Commonly known as: NEURONTIN Take 300 mg by mouth at bedtime.   ibuprofen 800 MG tablet Commonly known as: ADVIL Take 800 mg by mouth every 8  (eight) hours as needed.   lansoprazole 30 MG capsule Commonly known as: PREVACID Take 30 mg by mouth 2 (two) times daily.   lisinopril 5 MG tablet Commonly known as: ZESTRIL Take 5 mg by mouth at bedtime.   loratadine-pseudoephedrine 5-120 MG tablet Commonly known as: CLARITIN-D 12-hour Take 1 tablet by mouth at bedtime as needed for allergies. Alavert-D   Lumigan 0.01 % Soln Generic drug: bimatoprost Place 1 drop into both eyes at bedtime.   Melatonin 10 MG Tabs Take 10 mg by mouth at bedtime.   metFORMIN 500 MG 24 hr tablet Commonly known as: GLUCOPHAGE-XR Take 2,000 mg by mouth daily with breakfast.   RA PROBIOTIC GUMMIES PO Take 1 capsule by mouth at bedtime.   sertraline 100 MG tablet Commonly known as: ZOLOFT Take 100 mg by mouth daily.   simvastatin 20 MG tablet Commonly known as: ZOCOR Take 20 mg by mouth every evening.   sodium bicarbonate 650 MG tablet Take 1 tablet (650 mg total) by mouth 2 (two) times daily.   Toujeo SoloStar 300 UNIT/ML Sopn Generic drug: Insulin Glargine (1 Unit Dial) Inject 44 Units into the skin at bedtime.   Trulicity A999333 0000000 Sopn Generic drug: Dulaglutide Inject 0.75 mg into the skin every Sunday.       Allergies:  Allergies  Allergen Reactions  . Aspirin Hives  .  Shrimp [Shellfish Allergy] Hives    Just shrimp     Past Medical History, Surgical history, Social history, and Family History were reviewed and updated.  Review of Systems: All other 10 point review of systems is negative.   Physical Exam:  vitals were not taken for this visit.   Wt Readings from Last 3 Encounters:  08/04/19 189 lb 9.6 oz (86 kg)  06/27/19 191 lb (86.6 kg)  05/09/19 190 lb 0.6 oz (86.2 kg)    Ocular: Sclerae unicteric, pupils equal, round and reactive to light Ear-nose-throat: Oropharynx clear, dentition fair Lymphatic: No cervical or supraclavicular adenopathy Lungs no rales or rhonchi, good excursion bilaterally Heart  regular rate and rhythm, no murmur appreciated Abd soft, nontender, positive bowel sounds, no liver or spleen tip palpated on exam, no fluid wave  MSK no focal spinal tenderness, no joint edema Neuro: non-focal, well-oriented, appropriate affect Breasts: Deferred   Lab Results  Component Value Date   WBC 8.9 06/27/2019   HGB 14.9 06/27/2019   HCT 45.8 06/27/2019   MCV 95.2 06/27/2019   PLT 239 06/27/2019   Lab Results  Component Value Date   FERRITIN 99 04/22/2019   IRON 61 04/22/2019   TIBC 338 04/22/2019   UIBC 277 04/22/2019   IRONPCTSAT 18 (L) 04/22/2019   Lab Results  Component Value Date   RETICCTPCT 1.3 04/05/2018   RBC 4.81 06/27/2019   No results found for: KPAFRELGTCHN, LAMBDASER, KAPLAMBRATIO No results found for: IGGSERUM, IGA, IGMSERUM No results found for: Odetta Pink, SPEI   Chemistry      Component Value Date/Time   NA 139 06/27/2019 1632   NA 140 05/26/2017 0856   K 4.7 06/27/2019 1632   K 4.2 05/26/2017 0856   CL 104 06/27/2019 1632   CO2 24 06/27/2019 1632   CO2 25 05/26/2017 0856   BUN 14 06/27/2019 1632   BUN 12.7 05/26/2017 0856   CREATININE 0.92 06/27/2019 1632   CREATININE 1.11 04/22/2019 1331   CREATININE 1.2 05/26/2017 0856      Component Value Date/Time   CALCIUM 9.6 06/27/2019 1632   CALCIUM 9.5 05/26/2017 0856   ALKPHOS 95 06/27/2019 1632   ALKPHOS 83 05/26/2017 0856   AST 15 06/27/2019 1632   AST 11 (L) 04/22/2019 1331   AST 16 05/26/2017 0856   ALT 20 06/27/2019 1632   ALT 17 04/22/2019 1331   ALT 22 05/26/2017 0856   BILITOT 0.4 06/27/2019 1632   BILITOT 0.3 04/22/2019 1331   BILITOT 0.28 05/26/2017 0856       Impression and Plan: Mr. 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  We will see what his iron studies show and bring him back in for infusion if needed.  We will go ahead and plan to see him  back in another 6 months.  He will contact our office with any questions or concerns. We can certainly see him sooner if needed.   Laverna Peace, NP 12/1/20208:52 AM

## 2019-10-25 NOTE — Telephone Encounter (Signed)
Spoke with patient regarding appointments added per 12/1 los

## 2019-11-03 ENCOUNTER — Ambulatory Visit (INDEPENDENT_AMBULATORY_CARE_PROVIDER_SITE_OTHER): Payer: 59 | Admitting: Adult Health

## 2019-11-03 ENCOUNTER — Other Ambulatory Visit: Payer: Self-pay

## 2019-11-03 ENCOUNTER — Encounter: Payer: Self-pay | Admitting: Adult Health

## 2019-11-03 VITALS — BP 120/70 | HR 96 | Temp 97.5°F | Ht 68.0 in | Wt 186.6 lb

## 2019-11-03 DIAGNOSIS — I639 Cerebral infarction, unspecified: Secondary | ICD-10-CM | POA: Diagnosis not present

## 2019-11-03 DIAGNOSIS — R2689 Other abnormalities of gait and mobility: Secondary | ICD-10-CM

## 2019-11-03 DIAGNOSIS — R42 Dizziness and giddiness: Secondary | ICD-10-CM

## 2019-11-03 DIAGNOSIS — I6502 Occlusion and stenosis of left vertebral artery: Secondary | ICD-10-CM | POA: Diagnosis not present

## 2019-11-03 DIAGNOSIS — Z9989 Dependence on other enabling machines and devices: Secondary | ICD-10-CM | POA: Diagnosis not present

## 2019-11-03 DIAGNOSIS — E785 Hyperlipidemia, unspecified: Secondary | ICD-10-CM | POA: Diagnosis not present

## 2019-11-03 DIAGNOSIS — I10 Essential (primary) hypertension: Secondary | ICD-10-CM | POA: Diagnosis not present

## 2019-11-03 DIAGNOSIS — E1142 Type 2 diabetes mellitus with diabetic polyneuropathy: Secondary | ICD-10-CM

## 2019-11-03 DIAGNOSIS — G4733 Obstructive sleep apnea (adult) (pediatric): Secondary | ICD-10-CM

## 2019-11-03 DIAGNOSIS — I6381 Other cerebral infarction due to occlusion or stenosis of small artery: Secondary | ICD-10-CM

## 2019-11-03 NOTE — Progress Notes (Signed)
I agree with the above plan 

## 2019-11-03 NOTE — Progress Notes (Signed)
[facility]  Adam: Adam Quinn DOB: 01-15-1956   REASON FOR VISIT: Stroke Quinn HISTORY FROM: Adam  Chief Complaint  Adam presents with  . Quinn    Room 9, alone. No concerns     HISTORY OF PRESENT ILLNESS: Initial Consult 06/26/2016 ;  Adam Quinn is a 65 year Caucasian male who developed sudden onset of left face and neck and arm pain and paresthesias on April 15 while lifting boxes when he was mowing his house. He describes a feeling of sensation of on the top of his head and face. this lasted for a couple of weeks and gradually improved. he did not seek immediate help but eventually size primary physician. tender mri scan cervical spine which i have reviewed which showed mild foraminal degenerative changes without significant compression. he was referred to Adam Quinn for neurological surgical opinion who ordered an mri scan of the brain which was done on 05/22/16 which are personally reviewed and shows old left paramedian pontine lacunar infarct as well as multiple smaller bilateral subcortical lacunar infarcts as well. the left vertebral artery flow void was absent in the right vertebral artery was dominant and from the basilar artery. Adam has no significant vascular risk factors except diabetes which appears to be well controlled on last hemoglobin a1c a few months ago was 6.7. he has no history of hypertension. he has been started on statins years ago for more for primary prevention  As part of a studybut has no known history of cardiac disease or strokes. he does not smoke. he does have however family history is multiple family members on both sides having strokes. he is allergic to aspirin. Adam does have history of snoring and restless sleep as well as he feels significantly tired upon awakening. he has not yet had a sleep study. Update 09/30/2016 ; Adam Quinn after last 3 months ago. He states is doing well and has  not had any recurrent stroke or TIA symptoms. 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 Adam feels as a code fully advanced no deficits. Adam has not been taking Plavix as is concerned about possible interaction with omeprazole which is taking and was pointed out to him by his brother-in-law. Adam states his tried Pepcid before but did not work for him. I advised him to discuss with his primary physician  Adam is allergic to aspirin and cannot be switch to aspirin. He did undergo polysomnogram and was diagnosed with sleep apnea. He has been fitted with a CPAP mask but has not yet received that and plans to start it soon. He was seen by Dr. Brett Fairy for sleep apnea as well. UPDATE 03/30/17 CM Adam Quinn, 63 year old male returns for Quinn for history of stroke  mri scan of the brain which was done on 05/22/16  shows old left paramedian pontine lacunar infarct as well as multiple smaller bilateral subcortical lacunar infarcts as well. the left vertebral artery flow void was absent in the right vertebral artery was dominant and from the basilar artery. The left vertebral artery is small size likely from birth. The Adam has not had further stroke or TIA symptoms The Adam tells me today he has never started the Plavix because his wife who is an RN told him if he ever had surgery he would have to be off the medication for several days.  Adam has an allergy to aspirin. Adam is diabetic and claims his fasting sugars are between 120-170. Blood pressure in the office today 146/72 he claims he is compliant with his blood pressure medications. Adam was recently diagnosed with obstructive sleep apnea and is now on CPAP. He claims his fatigue is much less. He has not had a Quinn with his sleep doctor.  He returns for reevaluation Update 08/04/2019 : Adam is seen for Quinn today  after last visit with Adam Quinn nurse practitioner in May 2018.  He was seen on on 06/27/2019 to [facility] in the emergency room with sudden onset of right perioral and hand paresthesias and MRI scan showed left thalamic lacunar infarct.  He was not hospitalized and recommended outpatient evaluation.  Adam is allergic to aspirin hence he was continued on Plavix as there were no other worthwhile alternatives in terms of antiplatelet agents that he could use.  His blood pressure remains well controlled today it is 134/81.  He is tolerating Zocor well without muscle aches and pains.  He states his paresthesias appear to have improved but he still has mild tingling and numbness around his right upper lip as well as fingertips of the right hand.  He is compliant with using his CPAP every night for his sleep apnea.  He knows he needs to eat healthy and lose weight but he has not been practicing proper diet yet.  Update 11/03/2019: Adam Quinn is a 63 year old male who is being seen today for stroke and CPAP Quinn with wife via phone.  Residual deficits of mild right thumb numbness and right leg numbness but overall improving.  He also has concerns regarding ongoing balance difficulties and occasional dizziness sensation with neck extension.  These concerns have been chronic without worsening.  Continues on Plavix and simvastatin for secondary stroke prevention without side effects.  Blood pressure today 120/70.  After prior visit, he did have carotid Doppler completed on 08/17/2019 which negative for bilateral ICA stenosis and right vertebral artery antegrade flow but left vertebral artery not visualized along with a TCD which showed diffuse intracranial arthrosclerosis but otherwise unremarkable.  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  He endorses ongoing compliance with CPAP for sleep apnea.  He was last seen for CPAP Quinn in 05/23/2018.  CPAP compliance report from 10/03/2019   -11/01/2019 shows 29 out of 30 usage days with 28 days greater than 4 hours for 93% compliance with average usage 6 hours and 44 minutes.  Residual AHI 4.3.  Leaks in the 95th percentile 27.1 on set pressure of 7 and EPR level 3.  Continues to tolerate CPAP mask well without difficulty.  Continues to receive supplies from DME company.  No sleep apnea concerns at this time.     REVIEW OF SYSTEMS: Full 14 system review of systems performed and notable only for those listed, all others are neg: Numbness, tingling, sleep apnea, imbalance and dizziness  Epworth Sleepiness Scale How likely are you to doze in the following situations:  0 = not likely, 1 = slight chance, 2 = moderate chance, 3 = high chance   Sitting and Reading? 0 Watching Television? 2 Sitting inactive in a public place (theater or meeting)? 0 Lying down in the afternoon when circumstances permit? 1 Sitting and talking to someone? 2 Sitting quietly after lunch without alcohol? 0 In a car, while stopped for a few minutes in traffic? 1 As a passenger in a car for an hour  without a break? 0  Total = 6      ALLERGIES: Allergies  Allergen Reactions  . Aspirin Hives  . Shrimp [Shellfish Allergy] Hives    Just shrimp     HOME MEDICATIONS: Outpatient Medications Prior to Visit  Medication Sig Dispense Refill  . clopidogrel (PLAVIX) 75 MG tablet Take 1 tablet (75 mg total) by mouth daily. (Adam taking differently: Take 75 mg by mouth at bedtime. ) 90 tablet 0  . dorzolamide (TRUSOPT) 2 % ophthalmic solution Place 1 drop into both eyes 2 (two) times a day.    Marland Kitchen FREESTYLE LITE test strip   3  . gabapentin (NEURONTIN) 300 MG capsule Take 300 mg by mouth at bedtime.     Marland Kitchen ibuprofen (ADVIL,MOTRIN) 800 MG tablet Take 800 mg by mouth every 8 (eight) hours as needed.    . lansoprazole (PREVACID) 30 MG capsule Take 30 mg by mouth 2 (two) times daily.   1  . lisinopril (PRINIVIL,ZESTRIL) 5 MG tablet Take 5 mg by mouth at bedtime.      Marland Kitchen loratadine-pseudoephedrine (CLARITIN-D 12-HOUR) 5-120 MG tablet Take 1 tablet by mouth at bedtime as needed for allergies. Alavert-D    . LUMIGAN 0.01 % SOLN Place 1 drop into both eyes at bedtime.  3  . Melatonin 10 MG TABS Take 10 mg by mouth at bedtime.    . metFORMIN (GLUCOPHAGE-XR) 500 MG 24 hr tablet Take 2,000 mg by mouth daily with breakfast.     . Probiotic Product (RA PROBIOTIC GUMMIES PO) Take 1 capsule by mouth at bedtime.    . sertraline (ZOLOFT) 100 MG tablet Take 100 mg by mouth daily.   1  . simvastatin (ZOCOR) 20 MG tablet Take 20 mg by mouth every evening.    . sodium bicarbonate 650 MG tablet Take 1 tablet (650 mg total) by mouth 2 (two) times daily. 60 tablet 3  . TOUJEO SOLOSTAR 300 UNIT/ML SOPN Inject 50 Units into the skin at bedtime.   1  . TRULICITY A999333 0000000 SOPN Inject 1.5 mg into the skin every Sunday.   5  . Travoprost, BAK Free, (TRAVATAN) 0.004 % SOLN ophthalmic solution Place 1 drop into both eyes nightly.     No facility-administered medications prior to visit.    PAST MEDICAL HISTORY: Past Medical History:  Diagnosis Date  . Allergy    seasonal  . Anxiety   . Cataract    cataract surgery bilaterally resolved issues  . Central scotoma   . Chronic kidney disease    small protien showing uses Lisinopril for this  . Diverticulitis   . Diverticulosis   . DM (diabetes mellitus) ([place])   . GERD (gastroesophageal reflux disease)   . Glaucoma   . History of colon polyps   . History of hiatal hernia   . History of kidney stones    x1  . Iron deficiency anemia    Iron infusion 04/29/2019  . Neuropathy   . OSA on CPAP   . Stroke ([place])   . Wears glasses    READING    PAST SURGICAL HISTORY: Past Surgical History:  Procedure Laterality Date  . cataract surgery     bilateral  . COLONOSCOPY    . COLONOSCOPY N/A 06/04/2015   Procedure: COLONOSCOPY;  Surgeon: Inda Castle, MD;  Location: [facility]COPY;  Service: Endoscopy;  Laterality: N/A;   . COLONOSCOPY WITH PROPOFOL N/A 05/09/2019   Procedure: COLONOSCOPY WITH PROPOFOL;  Surgeon: Nelida Meuse III,  MD;  Location: [facility] ENDOSCOPY;  Service: Gastroenterology;  Laterality: N/A;  . ESOPHAGOGASTRODUODENOSCOPY N/A 06/04/2015   Procedure: ESOPHAGOGASTRODUODENOSCOPY (EGD);  Surgeon: Inda Castle, MD;  Location: [facility];  Service: Endoscopy;  Laterality: N/A;  . UPPER GASTROINTESTINAL ENDOSCOPY    . WISDOM TOOTH EXTRACTION     with sedation    FAMILY HISTORY: Family History  Problem Relation Age of Onset  . Colon polyps Father   . Lung cancer Father   . Diabetes Mother   . Dementia Mother   . Diabetes Sister        x 2  . Stroke Maternal Grandmother   . Stroke Paternal Grandfather   . Colon cancer Neg Hx   . Rectal cancer Neg Hx   . Stomach cancer Neg Hx   . Pancreatic cancer Neg Hx     SOCIAL HISTORY: Social History   Socioeconomic History  . Marital status: Married    Spouse name: Not on file  . Number of children: 3  . Years of education: Not on file  . Highest education level: Not on file  Occupational History  . Occupation: Optometrist  Tobacco Use  . Smoking status: Former Smoker    Quit date: 10/04/1997    Years since quitting: 22.0  . Smokeless tobacco: Never Used  Substance and Sexual Activity  . Alcohol use: No  . Drug use: No  . Sexual activity: Not on file  Other Topics Concern  . Not on file  Social History Narrative  . Not on file   Social Determinants of Health   Financial Resource Strain:   . Difficulty of Paying Living Expenses: Not on file  Food Insecurity:   . Worried About Charity fundraiser in the Last Year: Not on file  . Ran Out of Food in the Last Year: Not on file  Transportation Needs:   . Lack of Transportation (Medical): Not on file  . Lack of Transportation (Non-Medical): Not on file  Physical Activity:   . Days of Exercise per Week: Not on file  . Minutes of Exercise per Session: Not on file  Stress:   .  Feeling of Stress : Not on file  Social Connections:   . Frequency of Communication with Friends and Family: Not on file  . Frequency of Social Gatherings with Friends and Family: Not on file  . Attends Religious Services: Not on file  . Active Member of Clubs or Organizations: Not on file  . Attends Archivist Meetings: Not on file  . Marital Status: Not on file  Intimate Partner Violence:   . Fear of Current or Ex-Partner: Not on file  . Emotionally Abused: Not on file  . Physically Abused: Not on file  . Sexually Abused: Not on file     PHYSICAL EXAM  Vitals:   11/03/19 1012  BP: 120/70  Pulse: 96  Temp: (!) 97.5 F (36.4 C)  Weight: 186 lb 9.6 oz (84.6 kg)  Height: 5\' 8"  (1.727 m)   Body mass index is 28.37 kg/m.  Generalized: Obese middle-aged Caucasian male in no acute distress  Head: normocephalic and atraumatic   Neck: Supple, no carotid bruits  Cardiac: Regular rate rhythm, no murmur  Musculoskeletal: No deformity   Neurological examination   Mentation: Alert oriented to time, place, Attention span and concentration appropriate. Recent and remote memory intact.  Follows all commands speech and language fluent.  Cranial nerve II-XII: Pupils were equal round reactive to light extraocular  movements were full, visual field were full on confrontational test. Facial sensation and strength were normal. hearing was intact to finger rubbing bilaterally. Uvula tongue midline. head turning and shoulder shrug were normal and symmetric.Tongue protrusion into cheek strength was normal. Motor: normal bulk and tone, full strength in the BUE, BLE, Sensory: normal and symmetric to light touch, in the upper and lower extremities Coordination: finger-nose-finger, heel-to-shin bilaterally, no dysmetria Reflexes: 1+ upper lower and symmetric, plantar responses were flexor bilaterally. Gait and Station: Rising up from seated position without assistance, normal stance,   moderate stride, good arm swing, smooth turning, able to perform tiptoe, and heel walking without difficulty. Tandem gait is performed with moderate difficulty  DIAGNOSTIC DATA (LABS, IMAGING, TESTING) - I reviewed Adam records, labs, notes, testing and imaging myself where available.      Component Value Date/Time   NA 143 10/25/2019 0905   NA 140 05/26/2017 0856   K 4.5 10/25/2019 0905   K 4.2 05/26/2017 0856   CL 105 10/25/2019 0905   CO2 30 10/25/2019 0905   CO2 25 05/26/2017 0856   GLUCOSE 78 10/25/2019 0905   GLUCOSE 175 (H) 05/26/2017 0856   BUN 15 10/25/2019 0905   BUN 12.7 05/26/2017 0856   CREATININE 1.09 10/25/2019 0905   CREATININE 1.2 05/26/2017 0856   CALCIUM 10.0 10/25/2019 0905   CALCIUM 9.5 05/26/2017 0856   PROT 7.3 10/25/2019 0905   PROT 6.9 05/26/2017 0856   ALBUMIN 4.7 10/25/2019 0905   ALBUMIN 3.7 05/26/2017 0856   AST 13 (L) 10/25/2019 0905   AST 16 05/26/2017 0856   ALT 15 10/25/2019 0905   ALT 22 05/26/2017 0856   ALKPHOS 83 10/25/2019 0905   ALKPHOS 83 05/26/2017 0856   BILITOT 0.5 10/25/2019 0905   BILITOT 0.28 05/26/2017 0856   GFRNONAA >60 10/25/2019 0905   GFRAA >60 10/25/2019 0905      ASSESSMENT AND PLAN 15 year Caucasian male with episode of left face and arm pain and numbness likely secondary to a small brainstem infarct. MRI scan of the brain 6/2017shows multiple bilateral subcortical lacunar infarcts. Etiology unclear as to small vessel disease or left vertebral  artery occlusion.  Recent left thalamic infarct in August 2020 due to small vessel disease with residual right-sided numbness.  Vascular risk factors of vertebral artery stenosis, diabetes, hypertension, hyperlipidemia and sleep apnea.  He also endorses ongoing balance concerns which have been present for the past few years and worsened when turning too quickly. He was also seen for his 1 year sleep apnea compliance visit which showed satisfactory usage and optimal residual  AHI.  Tolerating well without concerns.  PLAN: Continue Plavix and simvastatin for secondary stroke prevention Continue to follow with PCP for HTN, HLD and DM management Ongoing use of CPAP for OSA management and advised to contact DME company for any needed supplies or CPAP concerns Referral placed to neuro rehab PT for evaluation of balance issues and possible improvement maintain strict control of hypertension with blood pressure goal below 130/90, diabetes with hemoglobin A1c goal below 6.5% and lipids with LDL cholesterol goal below 70 mg/dL. I also advised the Adam to eat a healthy diet with plenty of whole grains, cereals, fruits and vegetables, exercise regularly and maintain ideal body weight.    Follow up in 6 months or call earlier if needed  Greater than 50% time during this 30-minute Quinn visit was spent on discussing prior strokes with residual deficits, chronic balance difficulties with underlying history  of neuropathy and prior strokes, review of CPAP compliance report and education on importance of ongoing compliance along with importance of managing control with HTN, HLD and DM and answered all questions to Adam satisfaction  Frann Rider, AGNP-BC  [facility] [address] [place], [place] [address]  Phone [phone] Fax [phone] Note: This document was prepared with digital dictation and possible smart phrase technology. Any transcriptional errors that result from this process are unintentional.

## 2019-11-03 NOTE — Patient Instructions (Signed)
Continue clopidogrel 75 mg daily  and simvastatin for secondary stroke prevention  Ongoing use of CPAP for sleep apnea management  Continue to follow up with PCP regarding cholesterol, blood pressure and diabetes management   Referral placed to neuro rehab PT due to occasional dizziness and balance impairment -you will be called to schedule evaluation  Continue to monitor blood pressure at home  Maintain strict control of hypertension with blood pressure goal below 130/90, diabetes with hemoglobin A1c goal below 6.5% and cholesterol with LDL cholesterol (bad cholesterol) goal below 70 mg/dL. I also advised the patient to eat a healthy diet with plenty of whole grains, cereals, fruits and vegetables, exercise regularly and maintain ideal body weight.  Followup in the future with me in 6 months or call earlier if needed       Thank you for coming to see [place] at [facility]. I hope we have been able to provide you high quality care today.  You may receive a patient satisfaction survey over the next few weeks. We would appreciate your feedback and comments so that we may continue to improve ourselves and the health of our patients.

## 2019-12-06 ENCOUNTER — Ambulatory Visit: Payer: 59 | Admitting: Physical Therapy

## 2019-12-08 ENCOUNTER — Encounter: Payer: Self-pay | Admitting: Physical Therapy

## 2019-12-08 ENCOUNTER — Ambulatory Visit: Payer: 59 | Attending: Adult Health | Admitting: Physical Therapy

## 2019-12-08 ENCOUNTER — Other Ambulatory Visit: Payer: Self-pay

## 2019-12-08 DIAGNOSIS — R2681 Unsteadiness on feet: Secondary | ICD-10-CM | POA: Insufficient documentation

## 2019-12-08 DIAGNOSIS — R2689 Other abnormalities of gait and mobility: Secondary | ICD-10-CM | POA: Insufficient documentation

## 2019-12-08 DIAGNOSIS — R42 Dizziness and giddiness: Secondary | ICD-10-CM | POA: Diagnosis not present

## 2019-12-08 DIAGNOSIS — M6281 Muscle weakness (generalized): Secondary | ICD-10-CM | POA: Insufficient documentation

## 2019-12-09 NOTE — Therapy (Signed)
[facility] [address] [place], [place], [address] Phone: [phone]   Fax:  [phone]  Physical Therapy Evaluation  Patient Details  Name: Adam Quinn MRN: [id number] Date of Birth: 1956-04-16 Referring Provider (PT): Frann Rider, NP   Encounter Date: 12/08/2019  PT End of Session - 12/09/19 1535    Visit Number  1    Number of Visits  6    Date for PT Re-Evaluation  01/13/20    Authorization Type  [employer]    PT Start Time  1016    PT Stop Time  1100    PT Time Calculation (min)  44 min    Activity Tolerance  Patient tolerated treatment well    Behavior During Therapy  [facility] for tasks assessed/performed       Past Medical History:  Diagnosis Date  . Allergy    seasonal  . Anxiety   . Cataract    cataract surgery bilaterally resolved issues  . Central scotoma   . Chronic kidney disease    small protien showing uses Lisinopril for this  . Diverticulitis   . Diverticulosis   . DM (diabetes mellitus) ([place])   . GERD (gastroesophageal reflux disease)   . Glaucoma   . History of colon polyps   . History of hiatal hernia   . History of kidney stones    x1  . Iron deficiency anemia    Iron infusion 04/29/2019  . Neuropathy   . OSA on CPAP   . Stroke ([place])   . Wears glasses    READING    Past Surgical History:  Procedure Laterality Date  . cataract surgery     bilateral  . COLONOSCOPY    . COLONOSCOPY N/A 06/04/2015   Procedure: COLONOSCOPY;  Surgeon: Inda Castle, MD;  Location: [facility];  Service: Endoscopy;  Laterality: N/A;  . COLONOSCOPY WITH PROPOFOL N/A 05/09/2019   Procedure: COLONOSCOPY WITH PROPOFOL;  Surgeon: Doran Stabler, MD;  Location: [facility];  Service: Gastroenterology;  Laterality: N/A;  . ESOPHAGOGASTRODUODENOSCOPY N/A 06/04/2015   Procedure: ESOPHAGOGASTRODUODENOSCOPY (EGD);  Surgeon: Inda Castle, MD;  Location: [facility];  Service: Endoscopy;   Laterality: N/A;  . UPPER GASTROINTESTINAL ENDOSCOPY    . WISDOM TOOTH EXTRACTION     with sedation    There were no vitals filed for this visit.   Subjective Assessment - 12/08/19 1022    Subjective  Pt reports he had CVA in Aug. 2020 - woke up with Rt side of mouth numb - went to ED;  pt has h/o lacunar and thalamic CVA's; reports he has dizziness with looking up, closing eyes (as in shower) and with quick turns    Pertinent History  h/o lacunar infarct (2017), CKD, DM with neuropathy, diverticulitis, OSA on CPAP, glaucoma, h/o thalamic CVA    Patient Stated Goals  Improve balance and dizziness    Currently in Pain?  Other (Comment)   pt states he has a place in Rt side just above hip joint that will catch with prolonged ambulation and with alot of step negotiation        [facility] PT Assessment - 12/09/19 0001      Assessment   Medical Diagnosis  Imbalance; Dizziness with extension of neck     Referring Provider (PT)  Frann Rider, NP    Onset Date/Surgical Date  06/27/19    Prior Therapy  none      Precautions  Precautions  None      Restrictions   Weight Bearing Restrictions  No      Balance Screen   Has the patient fallen in the past 6 months  No    Has the patient had a decrease in activity level because of a fear of falling?   No    Is the patient reluctant to leave their home because of a fear of falling?   No      Home Environment   Living Environment  Private residence    Type of Home  Apartment    Home Access  Level entry    [place]  One level      Prior Function   Level of Independence  Independent      ROM / Strength   AROM / PROM / Strength  Strength      Strength   Overall Strength  Within functional limits for tasks performed   bil. LE's     Transfers   Transfers  Sit to Stand    Number of Reps  Other reps (comment)   1   Comments  no UE support needed      Ambulation/Gait   Ambulation/Gait  Yes    Ambulation/Gait Assistance  6:  Modified independent (Device/Increase time)    Ambulation Distance (Feet)  100 Feet    Assistive device  None    Gait Pattern  Step-through pattern    Ambulation Surface  Level;Indoor    Gait velocity  9.75 secs = 3.36      Standardized Balance Assessment   Standardized Balance Assessment  Timed Up and Go Test      Timed Up and Go Test   TUG  Normal TUG    Normal TUG (seconds)  12.5                Objective measurements completed on examination: See above findings.              PT Education - 12/09/19 1534    Education Details  Pt instructed in SLS and in tandem stance for HEP - verbally instructed in exercises with plan to give pictures next session    Person(s) Educated  Patient    Methods  Explanation;Demonstration    Comprehension  Verbalized understanding;Returned demonstration       PT Short Term Goals - 12/09/19 1546      PT SHORT TERM GOAL #1   Title  same as LTG's        PT Long Term Goals - 12/09/19 1546      PT LONG TERM GOAL #1   Title  Improve composite score of SOT by at least 10 points to demo improvement in balance and vestibular input in maintaining balance.    Time  6    Period  Weeks    Status  New    Target Date  01/13/20      PT LONG TERM GOAL #2   Title  Pt will perform Romberg test EC for at least 30 secs to demo improved balance and vestibular input in maintaining balance.    Baseline  Romberg EC 15 secs - 12-08-19    Time  6    Period  Weeks    Status  New    Target Date  01/13/20      PT LONG TERM GOAL #3   Title  Increase gait velocity from 3.36 ft/sec to >/= 3.9 ft/sec without device for increased  gait efficiency.    Baseline  9.75 secs = 3.36 ft/sec with no device - 12-08-19    Time  6    Period  Weeks    Status  New    Target Date  01/13/20      PT LONG TERM GOAL #4   Title  Pt will report no dizziness with Rt sidelying test to indicate improvement in dizziness and for increased ease & comfort with bed  mobility.    Baseline  Rt Epley performed 2 reps - 12-08-19    Time  6    Period  Weeks    Status  New    Target Date  01/13/20      PT LONG TERM GOAL #5   Title  Independent in HEP for balance and vestibular exercises.    Time  6    Period  Weeks    Status  New    Target Date  01/13/20             Plan - 12/09/19 1538    Clinical Impression Statement  Pt is a 64 yr old gentleman with h/o CVA's (lacunar and thalamic infarcts) with most recent one occurring in Aug. 2020.  Pt presents with imbalance and dysequilibrium, due to neuropathy due to DM and also due to vestibular hypofunction.  Pt reports he has dizziness with looking up but no nystagmus noted with any positional testing which would indicate BPPV.  Pt did report more dizziness with Rt sidelying test than with Lt sidelying test.  Pt will benefit from PT to address dizziness,balance and gait deficits.    Personal Factors and Comorbidities  Comorbidity 2;Past/Current Experience;Fitness    Comorbidities  CKD, DM with neuropathy, h/o CVA's (2017 and Aug. 2020), glaucoma, OSA on CPAP    Examination-Activity Limitations  Reach Overhead;Locomotion Level;Transfers;Squat;Stairs;Stand;Bend;Bed Mobility    Examination-Participation Restrictions  Cleaning;Community Activity;Driving;Shop;Interpersonal Relationship;Yard Work    [employer] officer  Evolving/Moderate complexity    Clinical Decision Making  Moderate    Rehab Potential  Good    PT Frequency  1x / week    PT Duration  6 weeks    PT Treatment/Interventions  ADLs/Self Care Home Management;Canalith Repostioning;DME Instruction;Neuromuscular re-education;Balance training;Therapeutic exercise;Therapeutic activities;Stair training;Gait training;Patient/family education;Vestibular    PT Next Visit Plan  recheck Rt Dix-Hallpike; SOT - give pictures for balance HEP - tandem and SLS and balance on foam as indicated    Consulted and Agree with Plan of Care  Patient        Patient will benefit from skilled therapeutic intervention in order to improve the following deficits and impairments:  Difficulty walking, Dizziness, Decreased balance, Decreased strength, Impaired sensation, Pain  Visit Diagnosis: Dizziness and giddiness - Plan: PT plan of care cert/re-cert  Other abnormalities of gait and mobility - Plan: PT plan of care cert/re-cert  Unsteadiness on feet - Plan: PT plan of care cert/re-cert  Muscle weakness (generalized) - Plan: PT plan of care cert/re-cert     Problem List Patient Active Problem List   Diagnosis Date Noted  . Stroke ([place]) 06/27/2019  . Depression 06/27/2019  . Elevated hemoglobin ([place]) 04/05/2018  . Iron deficiency anemia due to chronic blood loss 05/26/2017  . Malabsorption of iron 05/26/2017  . HTN (hypertension) 03/30/2017  . Hyperlipemia 03/30/2017  . OSA on CPAP 03/30/2017  . Snoring 07/08/2016  . Uncontrolled REM sleep behavior disorder 07/08/2016  . Lacunar infarct, acute ([place]) 06/26/2016  . Numbness 06/26/2016  . Occlusion and stenosis of  vertebral artery 06/26/2016  . Benign neoplasm of cecum 06/04/2015  . Diverticulosis of colon without hemorrhage 06/04/2015  . Internal hemorrhoids 06/04/2015  . Gastritis and gastroduodenitis 06/04/2015  . Microcytic anemia 04/13/2015  . Esophageal reflux 10/04/2012  . Diabetes mellitus without complication ([place]) 99991111  . CONSTIPATION 03/16/2008    DildayJenness Corner, PT 12/09/2019, [place] [address] [place], [place], [address] Phone: [phone]   Fax:  [phone]  Name: Adam Quinn MRN: [id number] Date of Birth: August 05, 1956

## 2019-12-15 ENCOUNTER — Ambulatory Visit: Payer: 59 | Admitting: Physical Therapy

## 2019-12-15 ENCOUNTER — Other Ambulatory Visit: Payer: Self-pay

## 2019-12-15 DIAGNOSIS — R2689 Other abnormalities of gait and mobility: Secondary | ICD-10-CM

## 2019-12-15 DIAGNOSIS — M6281 Muscle weakness (generalized): Secondary | ICD-10-CM | POA: Diagnosis not present

## 2019-12-15 DIAGNOSIS — R42 Dizziness and giddiness: Secondary | ICD-10-CM | POA: Diagnosis not present

## 2019-12-15 DIAGNOSIS — R2681 Unsteadiness on feet: Secondary | ICD-10-CM

## 2019-12-15 NOTE — Patient Instructions (Addendum)
(  SHEET with 4 positions - feet apart/together and EO/EC)  Feet Apart (Compliant Surface) Arm Motion - Eyes Closed    Stand on compliant surface: ___pillows_____, feet shoulder width apart.  Perform horizontal and vertical head turns 8-10 times each direction.    Standing Marching   Using a chair if necessary, march in place. Repeat 10 times. Do 1 sessions per day.    Hip Backward Kick   Using a chair for balance, keep legs shoulder width apart and toes pointed for- ward. Slowly extend one leg back, keeping knee straight. Do not lean forward. Repeat with other leg. Repeat 10 times each leg.  Do 1 sessions per day.  http://gt2.exer.us/340     Hip Side Kick   Holding a chair for balance, keep legs shoulder width apart and toes pointed forward. Swing a leg out to side, keeping knee straight. Do not lean. Repeat using other leg. Repeat 10 times. Do 1 sessions per day.  ALSO DO FORWARD KICKS - ALTERNATING LEGS - 10 REPS EACH    Feet Heel-Toe "Tandem", Varied Arm Positions - Eyes Open   With eyes open, right foot directly in front of the other, arms out, look straight ahead at a stationary object. Hold  30 seconds. Repeat 1 times per session. Do 1 sessions per day.  Copyright  VHI. All rights reserved.       Standing On One Leg Without Support .  Stand on one leg in neutral spine without support. Hold 10 seconds. Repeat on other leg. Do 1-2  repetitions, 1-2 sets.  http://bt.exer.us/36   Copyright  VHI. All rights reserved.    Side-Stepping   Walk to left side with eyes open. Take even steps, leading with same foot. Make sure each foot lifts off the floor. Repeat in opposite direction. Do 1 sessions per day. Add small squat when legs are apart for strengthening    AMBULATION: Walk Backward   Walk backward. Take large steps, do not drag feet. 1-2 reps per set, 1 sets per day, 5 days per week        [place] to side: 1) cross right  leg in front of left, 2) bring back leg out to side, then 3) cross right leg behind left, 4) bring left leg out to side. Continue sequence in same direction. Reverse sequence, moving in opposite direction. Repeat sequence 1-2 times per session. Do 1 sessions per day.

## 2019-12-16 ENCOUNTER — Encounter: Payer: Self-pay | Admitting: Physical Therapy

## 2019-12-16 NOTE — Therapy (Signed)
[facility] [address] [place], [place], [address] Phone: [phone]   Fax:  [phone]  Physical Therapy Treatment  Patient Details  Name: Adam Quinn MRN: [id number] Date of Birth: [birth year] Referring Provider (PT): Frann Rider, NP   Encounter Date: 12/15/2019  PT End of Session - 12/16/19 1509    Visit Number  2    Number of Visits  6    Date for PT Re-Evaluation  01/13/20    Authorization Type  [employer]    PT Start Time  1705    PT Stop Time  1750    PT Time Calculation (min)  45 min    Activity Tolerance  Patient tolerated treatment well    Behavior During Therapy  [facility] for tasks assessed/performed       Past Medical History:  Diagnosis Date  . Allergy    seasonal  . Anxiety   . Cataract    cataract surgery bilaterally resolved issues  . Central scotoma   . Chronic kidney disease    small protien showing uses Lisinopril for this  . Diverticulitis   . Diverticulosis   . DM (diabetes mellitus) ([place])   . GERD (gastroesophageal reflux disease)   . Glaucoma   . History of colon polyps   . History of hiatal hernia   . History of kidney stones    x1  . Iron deficiency anemia    Iron infusion 04/29/2019  . Neuropathy   . OSA on CPAP   . Stroke ([place])   . Wears glasses    READING    Past Surgical History:  Procedure Laterality Date  . cataract surgery     bilateral  . COLONOSCOPY    . COLONOSCOPY N/A 06/04/2015   Procedure: COLONOSCOPY;  Surgeon: Inda Castle, MD;  Location: [facility];  Service: Endoscopy;  Laterality: N/A;  . COLONOSCOPY WITH PROPOFOL N/A 05/09/2019   Procedure: COLONOSCOPY WITH PROPOFOL;  Surgeon: Doran Stabler, MD;  Location: [facility];  Service: Gastroenterology;  Laterality: N/A;  . ESOPHAGOGASTRODUODENOSCOPY N/A 06/04/2015   Procedure: ESOPHAGOGASTRODUODENOSCOPY (EGD);  Surgeon: Inda Castle, MD;  Location: [facility];  Service: Endoscopy;   Laterality: N/A;  . UPPER GASTROINTESTINAL ENDOSCOPY    . WISDOM TOOTH EXTRACTION     with sedation    There were no vitals filed for this visit.  Subjective Assessment - 12/16/19 1458    Subjective  Pt reports no changes since initial PT eval last week - has not had much time to practice the tandem and SLS exercises this week    Pertinent History  h/o lacunar infarct (2017), CKD, DM with neuropathy, diverticulitis, OSA on CPAP, glaucoma, h/o thalamic CVA    Patient Stated Goals  Improve balance and dizziness    Currently in Pain?  No/denies               Neuro Re-ed:  Pt was instructed in balance HEP - marching in place, marching forwards/backwards, forward, back and side kicks (10 reps each leg - each direction) Sidestepping with squats, braiding, SLS and tandem stance; pt performed these exercises at counter for UE support prn for safety (see pt instructions for details)         [facility] Adult PT Treatment/Exercise - 12/16/19 0001      Transfers   Transfers  Sit to Stand    Sit to Stand  4: Min guard    Number of Reps  Other  reps (comment)   5 reps   Comments  feet on blue Airex - no UE support with CGA to min assist      Exercises   Exercises  Ankle      Ankle Exercises: Standing   Heel Raises  Both;10 reps    Toe Raise  10 reps    Heel Walk (Round Trip)  1 rep along counter with UE support prn    Toe Walk (Round Trip)  1 rep along counter with UE support prn       Step ups onto 6" step with UE support on rail prn - 10 reps each leg for strengthening and for improved SLS    The following exercises (4 positions on 1 sheet) were issued for HEP - with head turns incorporated for increased vestibular input In maintaining balance - in corner with chair in front for safety  Balance Exercises - 12/16/19 1501      Balance Exercises: Standing   Standing Eyes Opened  Narrow base of support (BOS);Wide (BOA);Head turns;Foam/compliant surface;5 reps    Standing  Eyes Closed  Narrow base of support (BOS);Wide (BOA);Head turns;Foam/compliant surface;5 reps        PT Education - 12/16/19 1507    Education Details  pt instructed in HEP - balance exercises and balance on foam exercises with feet apart and together  - EO and EC    Person(s) Educated  Patient    Methods  Explanation;Demonstration;Handout    Comprehension  Verbalized understanding;Returned demonstration       PT Short Term Goals - 12/16/19 1512      PT SHORT TERM GOAL #1   Title  same as LTG's        PT Long Term Goals - 12/16/19 1512      PT LONG TERM GOAL #1   Title  Improve composite score of SOT by at least 10 points to demo improvement in balance and vestibular input in maintaining balance.    Time  6    Period  Weeks    Status  New      PT LONG TERM GOAL #2   Title  Pt will perform Romberg test EC for at least 30 secs to demo improved balance and vestibular input in maintaining balance.    Baseline  Romberg EC 15 secs - 12-08-19    Time  6    Period  Weeks    Status  New      PT LONG TERM GOAL #3   Title  Increase gait velocity from 3.36 ft/sec to >/= 3.9 ft/sec without device for increased gait efficiency.    Baseline  9.75 secs = 3.36 ft/sec with no device - 12-08-19    Time  6    Period  Weeks    Status  New      PT LONG TERM GOAL #4   Title  Pt will report no dizziness with Rt sidelying test to indicate improvement in dizziness and for increased ease & comfort with bed mobility.    Baseline  Rt Epley performed 2 reps - 12-08-19    Time  6    Period  Weeks    Status  New      PT LONG TERM GOAL #5   Title  Independent in HEP for balance and vestibular exercises.    Time  6    Period  Weeks    Status  New  Plan - 12/16/19 1509    Clinical Impression Statement  Pt had (-) Rt Dix-Hallpike test with no nystagmus and no c/o vertigo in test position, indicative of no Rt BPPV at current time.  Pt did have mild LOB with turning around (180  degrees) when performing balance exercises at counter for HEP instruction.  Pt did well overall with no significant LOB noted but pt did have intermittent UE support on counter for safety and stability.    Personal Factors and Comorbidities  Comorbidity 2;Past/Current Experience;Fitness    Comorbidities  CKD, DM with neuropathy, h/o CVA's (2017 and Aug. 2020), glaucoma, OSA on CPAP    Examination-Activity Limitations  Reach Overhead;Locomotion Level;Transfers;Squat;Stairs;Stand;Bend;Bed Mobility    Examination-Participation Restrictions  Cleaning;Community Activity;Driving;Shop;Interpersonal Relationship;Yard Work    [employer] officer  Evolving/Moderate complexity    Rehab Potential  Good    PT Frequency  1x / week    PT Duration  6 weeks    PT Treatment/Interventions  ADLs/Self Care Home Management;Canalith Repostioning;DME Instruction;Neuromuscular re-education;Balance training;Therapeutic exercise;Therapeutic activities;Stair training;Gait training;Patient/family education;Vestibular    PT Next Visit Plan  do SOT - check balance HEP - tandem and SLS and balance on foam as indicated    Consulted and Agree with Plan of Care  Patient       Patient will benefit from skilled therapeutic intervention in order to improve the following deficits and impairments:  Difficulty walking, Dizziness, Decreased balance, Decreased strength, Impaired sensation, Pain  Visit Diagnosis: Dizziness and giddiness  Other abnormalities of gait and mobility  Unsteadiness on feet     Problem List Patient Active Problem List   Diagnosis Date Noted  . Stroke ([place]) 06/27/2019  . Depression 06/27/2019  . Elevated hemoglobin ([place]) 04/05/2018  . Iron deficiency anemia due to chronic blood loss 05/26/2017  . Malabsorption of iron 05/26/2017  . HTN (hypertension) 03/30/2017  . Hyperlipemia 03/30/2017  . OSA on CPAP 03/30/2017  . Snoring 07/08/2016  . Uncontrolled REM sleep behavior disorder  07/08/2016  . Lacunar infarct, acute ([place]) 06/26/2016  . Numbness 06/26/2016  . Occlusion and stenosis of vertebral artery 06/26/2016  . Benign neoplasm of cecum 06/04/2015  . Diverticulosis of colon without hemorrhage 06/04/2015  . Internal hemorrhoids 06/04/2015  . Gastritis and gastroduodenitis 06/04/2015  . Microcytic anemia 04/13/2015  . Esophageal reflux 10/04/2012  . Diabetes mellitus without complication ([place]) 99991111  . CONSTIPATION 03/16/2008    DildayJenness Corner, PT 12/16/2019, 3:13 PM  [facility] [address] [place], [place], [address] Phone: [phone]   Fax:  [phone]  Name: Adam Quinn MRN: [id number] Date of Birth: May 11, 1956

## 2019-12-21 ENCOUNTER — Other Ambulatory Visit: Payer: Self-pay

## 2019-12-21 ENCOUNTER — Encounter: Payer: Self-pay | Admitting: Podiatry

## 2019-12-21 ENCOUNTER — Ambulatory Visit: Payer: 59 | Admitting: Podiatry

## 2019-12-21 DIAGNOSIS — E1140 Type 2 diabetes mellitus with diabetic neuropathy, unspecified: Secondary | ICD-10-CM | POA: Insufficient documentation

## 2019-12-21 DIAGNOSIS — M79675 Pain in left toe(s): Secondary | ICD-10-CM | POA: Diagnosis not present

## 2019-12-21 DIAGNOSIS — M79674 Pain in right toe(s): Secondary | ICD-10-CM | POA: Diagnosis not present

## 2019-12-21 DIAGNOSIS — E1142 Type 2 diabetes mellitus with diabetic polyneuropathy: Secondary | ICD-10-CM | POA: Diagnosis not present

## 2019-12-21 DIAGNOSIS — D689 Coagulation defect, unspecified: Secondary | ICD-10-CM | POA: Insufficient documentation

## 2019-12-21 DIAGNOSIS — B351 Tinea unguium: Secondary | ICD-10-CM

## 2019-12-21 NOTE — Progress Notes (Signed)
This patient presents to the office with chief complaint of long thick big nails and diabetic feet.  This patient  says there  is  no pain and discomfort in his  feet.  This patient says there are long thick painful nails.  These nails are painful walking and wearing shoes.  Patient has no history of infection or drainage from both feet.  Patient is unable to  self treat his own nails . This patient presents  to the office today for treatment of the  long nails and a foot evaluation due to history of  Diabetes.  Patient is taking plavix.  General Appearance  Alert, conversant and in no acute stress.  Vascular  Dorsalis pedis and posterior tibial  pulses are palpable  bilaterally.  Capillary return is within normal limits  bilaterally. Temperature is within normal limits  bilaterally.  Neurologic  Senn-Weinstein monofilament wire test absent   bilaterally. Muscle power within normal limits bilaterally.  Nails Thick disfigured discolored nails with subungual debris  Hallux nails  B/L. No evidence of bacterial infection or drainage bilaterally.  Orthopedic  No limitations of motion of motion feet .  No crepitus or effusions noted.  No bony pathology or digital deformities noted.Hallus malleus  B/L.  Hammer toes  2-5  B/L.  Skin  normotropic skin with no porokeratosis noted bilaterally.  No signs of infections or ulcers noted.     Onychomycosis  Diabetes with neuropathy.  IE  Debride nails x 10.  A diabetic foot exam was performed and there is no evidence of any vascular  pathology.   LOPS absent  B/L.  RTC 3 months.   Gardiner Barefoot DPM

## 2019-12-22 ENCOUNTER — Ambulatory Visit: Payer: 59 | Admitting: Physical Therapy

## 2019-12-22 ENCOUNTER — Other Ambulatory Visit: Payer: Self-pay

## 2019-12-22 DIAGNOSIS — R2681 Unsteadiness on feet: Secondary | ICD-10-CM | POA: Diagnosis not present

## 2019-12-22 DIAGNOSIS — M6281 Muscle weakness (generalized): Secondary | ICD-10-CM | POA: Diagnosis not present

## 2019-12-22 DIAGNOSIS — R42 Dizziness and giddiness: Secondary | ICD-10-CM | POA: Diagnosis not present

## 2019-12-22 DIAGNOSIS — R2689 Other abnormalities of gait and mobility: Secondary | ICD-10-CM | POA: Diagnosis not present

## 2019-12-23 ENCOUNTER — Encounter: Payer: Self-pay | Admitting: Physical Therapy

## 2019-12-23 NOTE — Therapy (Signed)
[facility] [address] [place], [place], [address] Phone: [phone]   Fax:  [phone]  Physical Therapy Treatment  Patient Details  Name: Adam Quinn MRN: [id number] Date of Birth: Sep 22, 1956 Referring Provider (PT): Frann Rider, NP   Encounter Date: 12/22/2019  PT End of Session - 12/23/19 1437    Visit Number  3    Number of Visits  6    Date for PT Re-Evaluation  01/13/20    Authorization Type  [employer]    PT Start Time  1705    PT Stop Time  1747    PT Time Calculation (min)  42 min    Activity Tolerance  Patient tolerated treatment well    Behavior During Therapy  [facility] for tasks assessed/performed       Past Medical History:  Diagnosis Date  . Allergy    seasonal  . Anxiety   . Cataract    cataract surgery bilaterally resolved issues  . Central scotoma   . Chronic kidney disease    small protien showing uses Lisinopril for this  . Diverticulitis   . Diverticulosis   . DM (diabetes mellitus) ([place])   . GERD (gastroesophageal reflux disease)   . Glaucoma   . History of colon polyps   . History of hiatal hernia   . History of kidney stones    x1  . Iron deficiency anemia    Iron infusion 04/29/2019  . Neuropathy   . OSA on CPAP   . Stroke ([place])   . Wears glasses    READING    Past Surgical History:  Procedure Laterality Date  . cataract surgery     bilateral  . COLONOSCOPY    . COLONOSCOPY N/A 06/04/2015   Procedure: COLONOSCOPY;  Surgeon: Inda Castle, MD;  [facility];  Service: Endoscopy;  Laterality: N/A;  . COLONOSCOPY WITH PROPOFOL N/A 05/09/2019   Procedure: COLONOSCOPY WITH PROPOFOL;  Surgeon: Doran Stabler, MD;  [facility];  Service: Gastroenterology;  Laterality: N/A;  . ESOPHAGOGASTRODUODENOSCOPY N/A 06/04/2015   Procedure: ESOPHAGOGASTRODUODENOSCOPY (EGD);  Surgeon: Inda Castle, MD;  [facility];  Service: Endoscopy;   Laterality: N/A;  . UPPER GASTROINTESTINAL ENDOSCOPY    . WISDOM TOOTH EXTRACTION     with sedation    There were no vitals filed for this visit.  Subjective Assessment - 12/22/19 1708    Subjective  Pt reports no changes or problems - reports he has done exercises some this past week    Pertinent History  h/o lacunar infarct (2017), CKD, DM with neuropathy, diverticulitis, OSA on CPAP, glaucoma, h/o thalamic CVA    Patient Stated Goals  Improve balance and dizziness    Currently in Pain?  No/denies         NeuroRe-ed:  Marketing executive Test:  Composite score 51/100 with N= 65/100  Condition 1;  Trials 1 & 3 below N with trial 2 WNL's Condition 2:  All 3 trials slightly below N Condition 3:   All 3 trials slightly below N Condition 4:  Trial 1 below N with trials 2 & 3 WNL's Condition 5:  FALL on all 3 trials Condition 6:  Trial 1 below N:  Trial 2 WNL's:  FALL on trial 3  Somatosensory - WNL's - score 98/100 with N=90/100 Visual WNL's - score 85/100 with N=78/100 Vestibular minimal to non - score 2/100 with N=55/100  Test results explained to patient  with copy of test given to him                   Balance Exercises - 12/23/19 1434      Balance Exercises: Standing   Rockerboard  Anterior/posterior;EO;10 reps   with UE support    Other Standing Exercises  marching in place 10 reps each leg inside // bars; Marching forwards/backwards 10' x 4 reps with minimal UE support on // bars with CGA        PT Education - 12/23/19 1436    Education Details  results of SOT explained to pt with copy of test given to him;  recommended use of flashlight at night and also use of night lights at night to increase visual input and to help compensate for lack of vestibular input    Person(s) Educated  Patient    Methods  Explanation;Handout    Comprehension  Verbalized understanding       PT Short Term Goals - 12/23/19 1442      PT SHORT TERM GOAL #1   Title   same as LTG's        PT Long Term Goals - 12/23/19 1442      PT LONG TERM GOAL #1   Title  Improve composite score of SOT by at least 10 points to demo improvement in balance and vestibular input in maintaining balance.    Time  6    Period  Weeks    Status  New      PT LONG TERM GOAL #2   Title  Pt will perform Romberg test EC for at least 30 secs to demo improved balance and vestibular input in maintaining balance.    Baseline  Romberg EC 15 secs - 12-08-19    Time  6    Period  Weeks    Status  New      PT LONG TERM GOAL #3   Title  Increase gait velocity from 3.36 ft/sec to >/= 3.9 ft/sec without device for increased gait efficiency.    Baseline  9.75 secs = 3.36 ft/sec with no device - 12-08-19    Time  6    Period  Weeks    Status  New      PT LONG TERM GOAL #4   Title  Pt will report no dizziness with Rt sidelying test to indicate improvement in dizziness and for increased ease & comfort with bed mobility.    Baseline  Rt Epley performed 2 reps - 12-08-19    Time  6    Period  Weeks    Status  New      PT LONG TERM GOAL #5   Title  Independent in HEP for balance and vestibular exercises.    Time  6    Period  Weeks    Status  New            Plan - 12/23/19 1438    Clinical Impression Statement  Pt has minimal vestibular input in maintaining balance per SOT with pt's score 2/100 with N=55/100 for vestibular input in maintaining balance.  Somatosensory & visual inputs WNL's per SOT.  Pt's composite score 51/100 with N=65/100.  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    Personal Factors and Comorbidities  Comorbidity 2;Past/Current Experience;Fitness    Comorbidities  CKD, DM with neuropathy, h/o CVA's (2017 and Aug. 2020), glaucoma, OSA on CPAP    Examination-Activity Limitations  Reach Overhead;Locomotion Level;Transfers;Squat;Stairs;Stand;Bend;Bed  Mobility    Examination-Participation Restrictions  Cleaning;Community  Activity;Driving;Shop;Interpersonal Relationship;Yard Work    Merchant navy officer  Evolving/Moderate complexity    Rehab Potential  Good    PT Frequency  1x / week    PT Duration  6 weeks    PT Treatment/Interventions  ADLs/Self Care Home Management;Canalith Repostioning;DME Instruction;Neuromuscular re-education;Balance training;Therapeutic exercise;Therapeutic activities;Stair training;Gait training;Patient/family education;Vestibular    PT Next Visit Plan  Cont balance/vestibular exercises;  check balance HEP - tandem and SLS and balance on foam as indicated    PT Home Exercise Plan  balance on foam;  SLS and tandem stance    Consulted and Agree with Plan of Care  Patient       Patient will benefit from skilled therapeutic intervention in order to improve the following deficits and impairments:  Difficulty walking, Dizziness, Decreased balance, Decreased strength, Impaired sensation, Pain  Visit Diagnosis: Other abnormalities of gait and mobility  Unsteadiness on feet     Problem List Patient Active Problem List   Diagnosis Date Noted  . Pain due to onychomycosis of toenails of both feet 12/21/2019  . Diabetic neuropathy ([place]) 12/21/2019  . Coagulation disorder ([place]) 12/21/2019  . Stroke ([place]) 06/27/2019  . Depression 06/27/2019  . Elevated hemoglobin ([place]) 04/05/2018  . Iron deficiency anemia due to chronic blood loss 05/26/2017  . Malabsorption of iron 05/26/2017  . HTN (hypertension) 03/30/2017  . Hyperlipemia 03/30/2017  . OSA on CPAP 03/30/2017  . Snoring 07/08/2016  . Uncontrolled REM sleep behavior disorder 07/08/2016  . Lacunar infarct, acute ([place]) 06/26/2016  . Numbness 06/26/2016  . Occlusion and stenosis of vertebral artery 06/26/2016  . Benign neoplasm of cecum 06/04/2015  . Diverticulosis of colon without hemorrhage 06/04/2015  . Internal hemorrhoids 06/04/2015  . Gastritis and gastroduodenitis 06/04/2015  . Microcytic anemia 04/13/2015  .  Esophageal reflux 10/04/2012  . Diabetes mellitus without complication ([place]) [id number]  . CONSTIPATION 03/16/2008    Alda Lea, PT 12/23/2019, 2:44 PM  [facility] [address] [place], [place], [address] Phone: [phone]   Fax:  [phone]  Name: DENIEL BLAKEMAN MRN: [id number] Date of Birth: Oct 28, 1956

## 2019-12-27 ENCOUNTER — Ambulatory Visit: Payer: 59 | Attending: Adult Health | Admitting: Physical Therapy

## 2019-12-27 ENCOUNTER — Other Ambulatory Visit: Payer: Self-pay

## 2019-12-27 DIAGNOSIS — R2681 Unsteadiness on feet: Secondary | ICD-10-CM | POA: Diagnosis not present

## 2019-12-27 DIAGNOSIS — M6281 Muscle weakness (generalized): Secondary | ICD-10-CM | POA: Diagnosis not present

## 2019-12-28 ENCOUNTER — Encounter: Payer: Self-pay | Admitting: Physical Therapy

## 2019-12-28 NOTE — Therapy (Signed)
[facility] [address] [place], [place], [address] Phone: [phone]   Fax:  [phone]  Physical Therapy Treatment  Patient Details  Name: ADRICK SCHNEIDERS MRN: [id number] Date of Birth: 07/30/1956 Referring Provider (PT): Frann Rider, NP   Encounter Date: 12/27/2019  PT End of Session - 12/28/19 1316    Visit Number  4    Number of Visits  6    Date for PT Re-Evaluation  01/13/20    Authorization Type  [employer]    PT Start Time  1401    PT Stop Time  [id number]    PT Time Calculation (min)  44 min    Activity Tolerance  Patient tolerated treatment well    Behavior During Therapy  [facility] for tasks assessed/performed       Past Medical History:  Diagnosis Date  . Allergy    seasonal  . Anxiety   . Cataract    cataract surgery bilaterally resolved issues  . Central scotoma   . Chronic kidney disease    small protien showing uses Lisinopril for this  . Diverticulitis   . Diverticulosis   . DM (diabetes mellitus) ([place])   . GERD (gastroesophageal reflux disease)   . Glaucoma   . History of colon polyps   . History of hiatal hernia   . History of kidney stones    x1  . Iron deficiency anemia    Iron infusion 04/29/2019  . Neuropathy   . OSA on CPAP   . Stroke ([place])   . Wears glasses    READING    Past Surgical History:  Procedure Laterality Date  . cataract surgery     bilateral  . COLONOSCOPY    . COLONOSCOPY N/A 06/04/2015   Procedure: COLONOSCOPY;  Surgeon: Inda Castle, MD;  [facility];  Service: Endoscopy;  Laterality: N/A;  . COLONOSCOPY WITH PROPOFOL N/A 05/09/2019   Procedure: COLONOSCOPY WITH PROPOFOL;  Surgeon: Doran Stabler, MD;  [facility];  Service: Gastroenterology;  Laterality: N/A;  . ESOPHAGOGASTRODUODENOSCOPY N/A 06/04/2015   Procedure: ESOPHAGOGASTRODUODENOSCOPY (EGD);  Surgeon: Inda Castle, MD;  [facility];  Service: Endoscopy;   Laterality: N/A;  . UPPER GASTROINTESTINAL ENDOSCOPY    . WISDOM TOOTH EXTRACTION     with sedation    There were no vitals filed for this visit.  Subjective Assessment - 12/28/19 1300    Subjective  Pt reports no changes or problems - "nothing new"    Pertinent History  h/o lacunar infarct (2017), CKD, DM with neuropathy, diverticulitis, OSA on CPAP, glaucoma, h/o thalamic CVA    Patient Stated Goals  Improve balance and dizziness    Currently in Pain?  No/denies                            Balance Exercises - 12/28/19 1304      Balance Exercises: Standing   Standing Eyes Opened  Wide (BOA);Head turns;Foam/compliant surface;5 reps    Standing Eyes Closed  Wide (BOA);Head turns;Foam/compliant surface;5 reps    Stepping Strategy  Anterior;Posterior;Lateral;5 reps;Foam/compliant surface   on blue mat   Rockerboard  Anterior/posterior;Lateral;Head turns;EO;EC;10 reps   10 reps    Step Ups  Forward;6 inch;Other (comment)   UE support prn to work more on balance - 10 reps each LE   Heel Raises  Both;10 reps    Other Standing Exercises  marching in  place 10 reps each leg inside // bars; Marching forwards/backwards 10' x 4 reps with minimal UE support on // bars with CGA          PT Short Term Goals - 12/23/19 1442      PT SHORT TERM GOAL #1   Title  same as LTG's        PT Long Term Goals - 12/28/19 1319      PT LONG TERM GOAL #1   Title  Improve composite score of SOT by at least 10 points to demo improvement in balance and vestibular input in maintaining balance.    Time  6    Period  Weeks    Status  New      PT LONG TERM GOAL #2   Title  Pt will perform Romberg test EC for at least 30 secs to demo improved balance and vestibular input in maintaining balance.    Baseline  Romberg EC 15 secs - 12-08-19    Time  6    Period  Weeks    Status  New      PT LONG TERM GOAL #3   Title  Increase gait velocity from 3.36 ft/sec to >/= 3.9 ft/sec without  device for increased gait efficiency.    Baseline  9.75 secs = 3.36 ft/sec with no device - 12-08-19    Time  6    Period  Weeks    Status  New      PT LONG TERM GOAL #4   Title  Pt will report no dizziness with Rt sidelying test to indicate improvement in dizziness and for increased ease & comfort with bed mobility.    Baseline  Rt Epley performed 2 reps - 12-08-19    Time  6    Period  Weeks    Status  New      PT LONG TERM GOAL #5   Title  Independent in HEP for balance and vestibular exercises.    Time  6    Period  Weeks    Status  New            Plan - 12/28/19 1317    Clinical Impression Statement  Pt had greatest LOB with balance activities with standing on compliant surfaces with eyes closed, indicative of decr. vestibular input.  Pt is progressing with improving static standing balance skills with EO.    Personal Factors and Comorbidities  Comorbidity 2;Past/Current Experience;Fitness    Comorbidities  CKD, DM with neuropathy, h/o CVA's (2017 and Aug. 2020), glaucoma, OSA on CPAP    Examination-Activity Limitations  Reach Overhead;Locomotion Level;Transfers;Squat;Stairs;Stand;Bend;Bed Mobility    Examination-Participation Restrictions  Cleaning;Community Activity;Driving;Shop;Interpersonal Relationship;Yard Work    Merchant navy officer  Evolving/Moderate complexity    Rehab Potential  Good    PT Frequency  1x / week    PT Duration  6 weeks    PT Treatment/Interventions  ADLs/Self Care Home Management;Canalith Repostioning;DME Instruction;Neuromuscular re-education;Balance training;Therapeutic exercise;Therapeutic activities;Stair training;Gait training;Patient/family education;Vestibular    PT Next Visit Plan  Cont balance/vestibular exercises;  check balance HEP - tandem and SLS and balance on foam as indicated    PT Home Exercise Plan  balance on foam;  SLS and tandem stance    Consulted and Agree with Plan of Care  Patient       Patient will benefit  from skilled therapeutic intervention in order to improve the following deficits and impairments:  Difficulty walking, Dizziness, Decreased balance, Decreased strength, Impaired sensation,  Pain  Visit Diagnosis: Unsteadiness on feet  Muscle weakness (generalized)     Problem List Patient Active Problem List   Diagnosis Date Noted  . Pain due to onychomycosis of toenails of both feet 12/21/2019  . Diabetic neuropathy ([place]) 12/21/2019  . Coagulation disorder ([place]) 12/21/2019  . Stroke ([place]) 06/27/2019  . Depression 06/27/2019  . Elevated hemoglobin ([place]) 04/05/2018  . Iron deficiency anemia due to chronic blood loss 05/26/2017  . Malabsorption of iron 05/26/2017  . HTN (hypertension) 03/30/2017  . Hyperlipemia 03/30/2017  . OSA on CPAP 03/30/2017  . Snoring 07/08/2016  . Uncontrolled REM sleep behavior disorder 07/08/2016  . Lacunar infarct, acute ([place]) 06/26/2016  . Numbness 06/26/2016  . Occlusion and stenosis of vertebral artery 06/26/2016  . Benign neoplasm of cecum 06/04/2015  . Diverticulosis of colon without hemorrhage 06/04/2015  . Internal hemorrhoids 06/04/2015  . Gastritis and gastroduodenitis 06/04/2015  . Microcytic anemia 04/13/2015  . Esophageal reflux 10/04/2012  . Diabetes mellitus without complication ([place]) 99991111  . CONSTIPATION 03/16/2008    [place], PT 12/28/2019, 1:20 PM  [facility] [address] [place], [place], [address] Phone: [phone]   Fax:  [phone]  Name: CORKEY SISK MRN: [id number] Date of Birth: 08-16-1956

## 2019-12-30 ENCOUNTER — Ambulatory Visit: Payer: 59 | Attending: Internal Medicine

## 2019-12-30 DIAGNOSIS — Z23 Encounter for immunization: Secondary | ICD-10-CM | POA: Insufficient documentation

## 2019-12-30 NOTE — Progress Notes (Signed)
   Covid-19 Vaccination Clinic  Name:  Adam Quinn    MRN: [id number] DOB: 11-09-1956  12/30/2019  [name] was observed post Covid-19 immunization for 15 minutes without incidence. He was provided with Vaccine Information Sheet and instruction to access the V-Safe system.   [name] was instructed to call 911 with any severe reactions post vaccine: Marland Kitchen Difficulty breathing  . Swelling of your face and throat  . A fast heartbeat  . A bad rash all over your body  . Dizziness and weakness    Immunizations Administered    Name Date Dose VIS Date Route   Pfizer COVID-19 Vaccine 12/30/2019  5:00 PM 0.3 mL 11/04/2019 Intramuscular   Manufacturer: [place]   Lot: [place]   [place]: [phone]

## 2020-01-03 ENCOUNTER — Ambulatory Visit: Payer: 59 | Admitting: Physical Therapy

## 2020-01-03 ENCOUNTER — Other Ambulatory Visit: Payer: Self-pay

## 2020-01-03 ENCOUNTER — Encounter: Payer: Self-pay | Admitting: Physical Therapy

## 2020-01-03 DIAGNOSIS — M6281 Muscle weakness (generalized): Secondary | ICD-10-CM | POA: Diagnosis not present

## 2020-01-03 DIAGNOSIS — R2681 Unsteadiness on feet: Secondary | ICD-10-CM | POA: Diagnosis not present

## 2020-01-03 NOTE — Therapy (Signed)
[facility] [address] [place], [place], [address] Phone: [phone]   Fax:  [phone]  Physical Therapy Treatment  Patient Details  Name: Adam Quinn MRN: [id number] Date of Birth: [birth year] Referring Provider (PT): Frann Rider, NP   Encounter Date: 01/03/2020  PT End of Session - 01/03/20 1435    Visit Number  5    Number of Visits  6    Date for PT Re-Evaluation  01/13/20    Authorization Type  [place] [employer]    PT Start Time  1408    PT Stop Time  1455    PT Time Calculation (min)  47 min       Past Medical History:  Diagnosis Date  . Allergy    seasonal  . Anxiety   . Cataract    cataract surgery bilaterally resolved issues  . Central scotoma   . Chronic kidney disease    small protien showing uses Lisinopril for this  . Diverticulitis   . Diverticulosis   . DM (diabetes mellitus) ([place])   . GERD (gastroesophageal reflux disease)   . Glaucoma   . History of colon polyps   . History of hiatal hernia   . History of kidney stones    x1  . Iron deficiency anemia    Iron infusion 04/29/2019  . Neuropathy   . OSA on CPAP   . Stroke ([place])   . Wears glasses    READING    Past Surgical History:  Procedure Laterality Date  . cataract surgery     bilateral  . COLONOSCOPY    . COLONOSCOPY N/A 06/04/2015   Procedure: COLONOSCOPY;  Surgeon: Inda Castle, MD;  [facility];  Service: Endoscopy;  Laterality: N/A;  . COLONOSCOPY WITH PROPOFOL N/A 05/09/2019   Procedure: COLONOSCOPY WITH PROPOFOL;  Surgeon: Doran Stabler, MD;  [facility];  Service: Gastroenterology;  Laterality: N/A;  . ESOPHAGOGASTRODUODENOSCOPY N/A 06/04/2015   Procedure: ESOPHAGOGASTRODUODENOSCOPY (EGD);  Surgeon: Inda Castle, MD;  [facility];  Service: Endoscopy;  Laterality: N/A;  . UPPER GASTROINTESTINAL ENDOSCOPY    . WISDOM TOOTH EXTRACTION     with sedation    There were no  vitals filed for this visit.  Subjective Assessment - 01/03/20 1407    Subjective  Pt states exercises are going well - reports no new problems or changes    Pertinent History  h/o lacunar infarct (2017), CKD, DM with neuropathy, diverticulitis, OSA on CPAP, glaucoma, h/o thalamic CVA    Patient Stated Goals  Improve balance and dizziness    Currently in Pain?  No/denies                       [facility] Adult PT Treatment/Exercise - 01/03/20 0001      Exercises   Exercises  Knee/Hip      Knee/Hip Exercises: Machines for Strengthening   Cybex Leg Press  bil. LE's 60# 3 sets 10 reps      Knee/Hip Exercises: Standing   Heel Raises  10 reps    Hip Flexion  Stengthening;Both;10 reps;1 set;Knee bent;Knee straight   green theraband- 20 reps total   Hip Abduction  Stengthening;Right;Left;1 set;10 reps;Knee straight   green theraband   Hip Extension  Stengthening;Right;Left;1 set;10 reps;Knee straight   green theraband    Forward Step Up  Right;Left;1 set;10 reps          Balance Exercises - 01/03/20  2005      Balance Exercises: Standing   Standing Eyes Opened  Wide (BOA);Head turns;Foam/compliant surface;5 reps    Standing Eyes Closed  Wide (BOA);Foam/compliant surface;5 reps   touching wall on Rt/Lt sides - diagonal patterns    Rockerboard  Anterior/posterior;EO;Other reps (comment)   20 reps    Heel Raises  Both;10 reps    Toe Raise  Both;10 reps    Other Standing Exercises  stepping over and back of orange hurdle 10 reps each leg with UE support on counter prn for safety         PT Education - 01/03/20 2007    Education Details  pt instructed in hip strengthening exercises - green theraband given for PRE's    Person(s) Educated  Patient    Methods  Explanation;Demonstration;Handout    Comprehension  Verbalized understanding;Returned demonstration       PT Short Term Goals - 01/03/20 2022      PT SHORT TERM GOAL #1   Title  same as LTG's        PT  Long Term Goals - 01/03/20 2022      PT LONG TERM GOAL #1   Title  Improve composite score of SOT by at least 10 points to demo improvement in balance and vestibular input in maintaining balance.    Time  6    Period  Weeks    Status  New      PT LONG TERM GOAL #2   Title  Pt will perform Romberg test EC for at least 30 secs to demo improved balance and vestibular input in maintaining balance.    Baseline  Romberg EC 15 secs - 12-08-19    Time  6    Period  Weeks    Status  New      PT LONG TERM GOAL #3   Title  Increase gait velocity from 3.36 ft/sec to >/= 3.9 ft/sec without device for increased gait efficiency.    Baseline  9.75 secs = 3.36 ft/sec with no device - 12-08-19    Time  6    Period  Weeks    Status  New      PT LONG TERM GOAL #4   Title  Pt will report no dizziness with Rt sidelying test to indicate improvement in dizziness and for increased ease & comfort with bed mobility.    Baseline  Rt Epley performed 2 reps - 12-08-19    Time  6    Period  Weeks    Status  New      PT LONG TERM GOAL #5   Title  Independent in HEP for balance and vestibular exercises.    Time  6    Period  Weeks    Status  New            Plan - 01/03/20 2017    Clinical Impression Statement  Pt tolerated standing PRE's for hip musc. well, with minimal c/o fatigue.  Pt continues to have difficulty maintaining balance with standing on compliant surface with EC, indicative of vestibular hypofunction.  Pt is improving with balance; will determine D/C or renewal at next visit.    Personal Factors and Comorbidities  Comorbidity 2;Past/Current Experience;Fitness    Comorbidities  CKD, DM with neuropathy, h/o CVA's (2017 and Aug. 2020), glaucoma, OSA on CPAP    Examination-Activity Limitations  Reach Overhead;Locomotion Level;Transfers;Squat;Stairs;Stand;Bend;Bed Mobility    Examination-Participation Restrictions  Cleaning;Community Activity;Driving;Shop;Interpersonal Relationship;[employer] Work  Stability/Clinical Decision Making  Evolving/Moderate complexity    Rehab Potential  Good    PT Frequency  1x / week    PT Duration  6 weeks    PT Treatment/Interventions  ADLs/Self Care Home Management;Canalith Repostioning;DME Instruction;Neuromuscular re-education;Balance training;Therapeutic exercise;Therapeutic activities;Stair training;Gait training;Patient/family education;Vestibular    PT Next Visit Plan  Cont balance/vestibular exercises; check strengthening exercises given for HEP    PT Home Exercise Plan  balance on foam;  SLS and tandem stance    Consulted and Agree with Plan of Care  Patient       Patient will benefit from skilled therapeutic intervention in order to improve the following deficits and impairments:  Difficulty walking, Dizziness, Decreased balance, Decreased strength, Impaired sensation, Pain  Visit Diagnosis: Unsteadiness on feet  Muscle weakness (generalized)     Problem List Patient Active Problem List   Diagnosis Date Noted  . Pain due to onychomycosis of toenails of both feet 12/21/2019  . Diabetic neuropathy ([place]) 12/21/2019  . Coagulation disorder ([place]) 12/21/2019  . Stroke ([place]) 06/27/2019  . Depression 06/27/2019  . Elevated hemoglobin ([place]) 04/05/2018  . Iron deficiency anemia due to chronic blood loss 05/26/2017  . Malabsorption of iron 05/26/2017  . HTN (hypertension) 03/30/2017  . Hyperlipemia 03/30/2017  . OSA on CPAP 03/30/2017  . Snoring 07/08/2016  . Uncontrolled REM sleep behavior disorder 07/08/2016  . Lacunar infarct, acute ([place]) 06/26/2016  . Numbness 06/26/2016  . Occlusion and stenosis of vertebral artery 06/26/2016  . Benign neoplasm of cecum 06/04/2015  . Diverticulosis of colon without hemorrhage 06/04/2015  . Internal hemorrhoids 06/04/2015  . Gastritis and gastroduodenitis 06/04/2015  . Microcytic anemia 04/13/2015  . Esophageal reflux 10/04/2012  . Diabetes mellitus without complication ([place]) 99991111  .  CONSTIPATION 03/16/2008    Alda Lea, PT 01/03/2020, 8:25 PM  [facility] [address] [place], [place], [address] Phone: [phone]   Fax:  [phone]  Name: Adam Quinn MRN: [id number] Date of Birth: 03/05/1956

## 2020-01-03 NOTE — Patient Instructions (Signed)
Resisted - Four Way Hip     With theraband around right ankle, balance on left leg and complete leg kicks in the following directions:  1. Facing toward theraband, extend right leg behind you with knee straight. Avoid bending forward at your hips. Repeat 10 times. 2. Turn to right, slowly kick leg out to the side while keeping your toes facing forward. Avoid leaning to the side. Repeat 10 times. 3. Turn to face away from theraband, kick leg straight forward keeping knee straight. Repeat 10 times. 4. Turn to the right and take a step away from the theraband. Pull right leg in and slightly in front of left. Repeat 10 times.  (?? We did not do this one)  Also do lifting leg up with knee bent  Then repeat the 4 positions with the band around the other ankle.

## 2020-01-10 ENCOUNTER — Ambulatory Visit: Payer: 59 | Admitting: Physical Therapy

## 2020-01-17 ENCOUNTER — Ambulatory Visit: Payer: 59 | Admitting: Physical Therapy

## 2020-01-20 ENCOUNTER — Ambulatory Visit: Payer: 59

## 2020-01-24 ENCOUNTER — Ambulatory Visit: Payer: 59 | Attending: Internal Medicine

## 2020-01-24 ENCOUNTER — Ambulatory Visit: Payer: 59 | Admitting: Physical Therapy

## 2020-01-24 DIAGNOSIS — Z23 Encounter for immunization: Secondary | ICD-10-CM | POA: Insufficient documentation

## 2020-01-24 NOTE — Progress Notes (Signed)
   Covid-19 Vaccination Clinic  Name:  SAN MANSBERGER    MRN: [id number] DOB: 07/15/1956  01/24/2020  Mr. Suddarth was observed post Covid-19 immunization for 30 minutes based on pre-vaccination screening without incident. He was provided with Vaccine Information Sheet and instruction to access the V-Safe system.   Mr. Shahan was instructed to call 911 with any severe reactions post vaccine: Marland Kitchen Difficulty breathing  . Swelling of face and throat  . A fast heartbeat  . A bad rash all over body  . Dizziness and weakness   Immunizations Administered    Name Date Dose VIS Date Route   Pfizer COVID-19 Vaccine 01/24/2020  4:10 PM 0.3 mL 11/04/2019 Intramuscular   Manufacturer: [place]   Lot: [id number]   [place]: [id number]

## 2020-01-31 ENCOUNTER — Ambulatory Visit: Payer: 59 | Admitting: Physical Therapy

## 2020-02-23 ENCOUNTER — Other Ambulatory Visit (HOSPITAL_BASED_OUTPATIENT_CLINIC_OR_DEPARTMENT_OTHER): Payer: Self-pay | Admitting: Family Medicine

## 2020-02-23 DIAGNOSIS — I6930 Unspecified sequelae of cerebral infarction: Secondary | ICD-10-CM | POA: Diagnosis not present

## 2020-02-23 DIAGNOSIS — E1142 Type 2 diabetes mellitus with diabetic polyneuropathy: Secondary | ICD-10-CM | POA: Diagnosis not present

## 2020-02-23 DIAGNOSIS — E1121 Type 2 diabetes mellitus with diabetic nephropathy: Secondary | ICD-10-CM | POA: Diagnosis not present

## 2020-02-23 DIAGNOSIS — E785 Hyperlipidemia, unspecified: Secondary | ICD-10-CM | POA: Diagnosis not present

## 2020-02-23 DIAGNOSIS — N1831 Chronic kidney disease, stage 3a: Secondary | ICD-10-CM | POA: Diagnosis not present

## 2020-03-21 ENCOUNTER — Encounter: Payer: Self-pay | Admitting: Podiatry

## 2020-03-21 ENCOUNTER — Ambulatory Visit: Payer: 59 | Admitting: Podiatry

## 2020-03-21 ENCOUNTER — Other Ambulatory Visit: Payer: Self-pay

## 2020-03-21 VITALS — Temp 97.0°F

## 2020-03-21 DIAGNOSIS — E1142 Type 2 diabetes mellitus with diabetic polyneuropathy: Secondary | ICD-10-CM

## 2020-03-21 DIAGNOSIS — M79675 Pain in left toe(s): Secondary | ICD-10-CM | POA: Diagnosis not present

## 2020-03-21 DIAGNOSIS — D689 Coagulation defect, unspecified: Secondary | ICD-10-CM

## 2020-03-21 DIAGNOSIS — M79674 Pain in right toe(s): Secondary | ICD-10-CM

## 2020-03-21 DIAGNOSIS — B351 Tinea unguium: Secondary | ICD-10-CM | POA: Diagnosis not present

## 2020-03-21 NOTE — Progress Notes (Signed)
This patient returns to my office for at risk foot care.  This patient requires this care by a professional since this patient will be at risk due to having diabetic neuropathy anc coagulation defect.  This patient is unable to cut nails himself since the patient cannot reach his nails.These nails are painful walking and wearing shoes.  This patient presents for at risk foot care today.  General Appearance  Alert, conversant and in no acute stress.  Vascular  Dorsalis pedis and posterior tibial  pulses are palpable  bilaterally.  Capillary return is within normal limits  bilaterally. Temperature is within normal limits  bilaterally.  Neurologic  Senn-Weinstein monofilament wire test absent  bilaterally. Muscle power within normal limits bilaterally.  Nails Thick disfigured discolored nails with subungual debris  Hallux nails  B/L. No evidence of bacterial infection or drainage bilaterally.  Orthopedic  No limitations of motion  feet .  No crepitus or effusions noted.  Hammer toes 2-4  B/L.  Skin  normotropic skin with no porokeratosis noted bilaterally.  No signs of infections or ulcers noted.     Onychomycosis  Pain in right toes  Pain in left toes  Consent was obtained for treatment procedures.   Mechanical debridement of hallux nails  bilaterally performed with a nail nipper.  Filed with dremel without incident.    Return office visit    3 months                  Told patient to return for periodic foot care and evaluation due to potential at risk complications.   Gardiner Barefoot DPM

## 2020-03-28 ENCOUNTER — Other Ambulatory Visit (HOSPITAL_BASED_OUTPATIENT_CLINIC_OR_DEPARTMENT_OTHER): Payer: Self-pay | Admitting: Family Medicine

## 2020-04-24 ENCOUNTER — Other Ambulatory Visit: Payer: Self-pay

## 2020-04-24 ENCOUNTER — Inpatient Hospital Stay: Payer: 59 | Attending: Hematology & Oncology

## 2020-04-24 ENCOUNTER — Inpatient Hospital Stay (HOSPITAL_BASED_OUTPATIENT_CLINIC_OR_DEPARTMENT_OTHER): Payer: 59 | Admitting: Hematology & Oncology

## 2020-04-24 VITALS — BP 141/80 | HR 64 | Temp 97.5°F | Resp 18 | Wt 185.0 lb

## 2020-04-24 DIAGNOSIS — E119 Type 2 diabetes mellitus without complications: Secondary | ICD-10-CM | POA: Insufficient documentation

## 2020-04-24 DIAGNOSIS — K909 Intestinal malabsorption, unspecified: Secondary | ICD-10-CM | POA: Insufficient documentation

## 2020-04-24 DIAGNOSIS — D500 Iron deficiency anemia secondary to blood loss (chronic): Secondary | ICD-10-CM

## 2020-04-24 DIAGNOSIS — D508 Other iron deficiency anemias: Secondary | ICD-10-CM

## 2020-04-24 DIAGNOSIS — E611 Iron deficiency: Secondary | ICD-10-CM | POA: Diagnosis not present

## 2020-04-24 LAB — CMP (CANCER CENTER ONLY)
ALT: 14 U/L (ref 0–44)
AST: 11 U/L — ABNORMAL LOW (ref 15–41)
Albumin: 4.5 g/dL (ref 3.5–5.0)
Alkaline Phosphatase: 82 U/L (ref 38–126)
Anion gap: 8 (ref 5–15)
BUN: 16 mg/dL (ref 8–23)
CO2: 29 mmol/L (ref 22–32)
Calcium: 10.1 mg/dL (ref 8.9–10.3)
Chloride: 105 mmol/L (ref 98–111)
Creatinine: 1.19 mg/dL (ref 0.61–1.24)
GFR, Est AFR Am: >60 mL/min (ref >60)
GFR, Estimated: >60 mL/min (ref >60)
Glucose, Bld: 105 mg/dL — ABNORMAL HIGH (ref 70–99)
Potassium: 5.1 mmol/L (ref 3.5–5.1)
Sodium: 142 mmol/L (ref 135–145)
Total Bilirubin: 0.4 mg/dL (ref 0.3–1.2)
Total Protein: 6.9 g/dL (ref 6.5–8.1)

## 2020-04-24 LAB — CBC WITH DIFFERENTIAL (CANCER CENTER ONLY)
Abs Immature Granulocytes: 0.02 10*3/uL (ref 0.00–0.07)
Basophils Absolute: 0.1 10*3/uL (ref 0.0–0.1)
Basophils Relative: 1 %
Eosinophils Absolute: 0.1 10*3/uL (ref 0.0–0.5)
Eosinophils Relative: 2 %
HCT: 46.3 % (ref 39.0–52.0)
Hemoglobin: 15.1 g/dL (ref 13.0–17.0)
Immature Granulocytes: 0 %
Lymphocytes Relative: 17 %
Lymphs Abs: 1.3 10*3/uL (ref 0.7–4.0)
MCH: 30.9 pg (ref 26.0–34.0)
MCHC: 32.6 g/dL (ref 30.0–36.0)
MCV: 94.7 fL (ref 80.0–100.0)
Monocytes Absolute: 0.5 10*3/uL (ref 0.1–1.0)
Monocytes Relative: 7 %
Neutro Abs: 5.3 10*3/uL (ref 1.7–7.7)
Neutrophils Relative %: 73 %
Platelet Count: 225 10*3/uL (ref 150–400)
RBC: 4.89 MIL/uL (ref 4.22–5.81)
RDW: 12.9 % (ref 11.5–15.5)
WBC Count: 7.3 10*3/uL (ref 4.0–10.5)
nRBC: 0 % (ref 0.0–0.2)

## 2020-04-24 LAB — FERRITIN: Ferritin: 112 ng/mL (ref 24–336)

## 2020-04-24 LAB — IRON AND TIBC
Iron: 97 ug/dL (ref 45–182)
Saturation Ratios: 26 % (ref 17.9–39.5)
TIBC: 371 ug/dL (ref 250–450)
UIBC: 274 ug/dL

## 2020-04-24 NOTE — Progress Notes (Signed)
Hematology and Oncology Follow Up Visit  Adam Quinn [id number] [birth year] 64 y.o. 04/24/2020   Principle Diagnosis:  Iron deficiency anemia Erythropoietin deficiency anemia  Current Therapy:   IV iron as indicated   Interim History:  Adam Quinn came in for a follow-up.  We last saw him 6 months ago.  He has been doing okay.  He is trying to lose little bit of weight.  He does have diabetes.  This apparently seems to be under good control.  He has had no issues with nausea or vomiting.  Is been no bleeding.  He has had no cough or shortness of breath.  He has had the coronavirus vaccine.  His last iron studies back in December showed a ferritin of 223 with an iron saturation of 32%.  He has had no rashes.  There is been no leg swelling.  He has had no neuropathy from the diabetes.  Overall, his performance status is ECOG 0.   Medications:  Allergies as of 04/24/2020      Reactions   Aspirin Hives   Shrimp [shellfish Allergy] Hives   Just shrimp      Medication List       Accurate as of April 24, 2020 10:43 AM. If you have any questions, ask your nurse or doctor.        STOP taking these medications   Toujeo SoloStar 300 UNIT/ML Solostar Pen Generic drug: insulin glargine (1 Unit Dial) Stopped by: Volanda Napoleon, MD     TAKE these medications   clopidogrel 75 MG tablet Commonly known as: PLAVIX Take 1 tablet (75 mg total) by mouth daily. What changed: when to take this   Dexcom G6 Receiver Devi   Dexcom G6 Sensor Misc USE AS DIRECTED TO CHECK BLOOD SUGAR   Dexcom G6 Transmitter Misc AS DIRECTED AS DIRECTED CHANGE EVERY 90 DAYS   dorzolamide 2 % ophthalmic solution Commonly known as: TRUSOPT Place 1 drop into both eyes 2 (two) times a day.   FREESTYLE LITE test strip Generic drug: glucose blood   gabapentin 300 MG capsule Commonly known as: NEURONTIN Take 300 mg by mouth at bedtime.   ibuprofen 800 MG tablet Commonly known as: ADVIL Take 800 mg  by mouth every 8 (eight) hours as needed.   lansoprazole 30 MG capsule Commonly known as: PREVACID Take 30 mg by mouth 2 (two) times daily.   lisinopril 2.5 MG tablet Commonly known as: ZESTRIL Take 2.5 mg by mouth daily.   loratadine-pseudoephedrine 5-120 MG tablet Commonly known as: CLARITIN-D 12-hour Take 1 tablet by mouth at bedtime as needed for allergies. Alavert-D   Lumigan 0.01 % Soln Generic drug: bimatoprost Place 1 drop into both eyes at bedtime.   Melatonin 10 MG Tabs Take 10 mg by mouth at bedtime.   metFORMIN 500 MG 24 hr tablet Commonly known as: GLUCOPHAGE-XR Take 2,000 mg by mouth daily with breakfast.   PenTips 31G X 5 MM Misc Generic drug: Insulin Pen Needle USE TO ADMINISTER VICTOZA ONCE DAILY   RA PROBIOTIC GUMMIES PO Take 1 capsule by mouth at bedtime.   sertraline 100 MG tablet Commonly known as: ZOLOFT Take 100 mg by mouth daily.   simvastatin 20 MG tablet Commonly known as: ZOCOR Take 20 mg by mouth every evening.   sodium bicarbonate 650 MG tablet Take 1 tablet (650 mg total) by mouth 2 (two) times daily.   Travoprost (BAK Free) 0.004 % Soln ophthalmic solution Commonly known as: TRAVATAN Place  1 drop into both eyes nightly.   Tyler Aas FlexTouch 200 UNIT/ML FlexTouch Pen Generic drug: insulin degludec SMARTSIG:50 Unit(s) SUB-Q Daily   Trulicity 1.5 0000000 Sopn Generic drug: Dulaglutide USE AS DIRECTED ONCE WEEKLY       Allergies:  Allergies  Allergen Reactions  . Aspirin Hives  . Shrimp [Shellfish Allergy] Hives    Just shrimp     Past Medical History, Surgical history, Social history, and Family History were reviewed and updated.  Review of Systems: Review of Systems  Constitutional: Negative.   HENT: Negative.   Eyes: Negative.   Respiratory: Negative.   Cardiovascular: Negative.   Gastrointestinal: Negative.   Genitourinary: Negative.   Musculoskeletal: Negative.   Skin: Negative.   Neurological: Negative.     Endo/Heme/Allergies: Negative.   Psychiatric/Behavioral: Negative.      Physical Exam:  weight is 185 lb (83.9 kg). His temporal temperature is 97.5 F (36.4 C) (abnormal). His blood pressure is 141/80 (abnormal) and his pulse is 64. His respiration is 18 and oxygen saturation is 100%.   Wt Readings from Last 3 Encounters:  04/24/20 185 lb (83.9 kg)  11/03/19 186 lb 9.6 oz (84.6 kg)  08/04/19 189 lb 9.6 oz (86 kg)    Physical Exam Vitals reviewed.  HENT:     Head: Normocephalic and atraumatic.  Eyes:     Pupils: Pupils are equal, round, and reactive to light.  Cardiovascular:     Rate and Rhythm: Normal rate and regular rhythm.     Heart sounds: Normal heart sounds.  Pulmonary:     Effort: Pulmonary effort is normal.     Breath sounds: Normal breath sounds.  Abdominal:     General: Bowel sounds are normal.     Palpations: Abdomen is soft.  Musculoskeletal:        General: No tenderness or deformity. Normal range of motion.     Cervical back: Normal range of motion.  Lymphadenopathy:     Cervical: No cervical adenopathy.  Skin:    General: Skin is warm and dry.     Findings: No erythema or rash.  Neurological:     Mental Status: He is alert and oriented to person, place, and time.  Psychiatric:        Behavior: Behavior normal.        Thought Content: Thought content normal.        Judgment: Judgment normal.      Lab Results  Component Value Date   WBC 7.3 04/24/2020   HGB 15.1 04/24/2020   HCT 46.3 04/24/2020   MCV 94.7 04/24/2020   PLT 225 04/24/2020   Lab Results  Component Value Date   FERRITIN 223 10/25/2019   IRON 107 10/25/2019   TIBC 331 10/25/2019   UIBC 224 10/25/2019   IRONPCTSAT 32 10/25/2019   Lab Results  Component Value Date   RETICCTPCT 1.3 04/05/2018   RBC 4.89 04/24/2020   No results found for: KPAFRELGTCHN, LAMBDASER, KAPLAMBRATIO No results found for: IGGSERUM, IGA, IGMSERUM No results found for: Odetta Pink, SPEI   Chemistry      Component Value Date/Time   NA 142 04/24/2020 1003   NA 140 05/26/2017 0856   K 5.1 04/24/2020 1003   K 4.2 05/26/2017 0856   CL 105 04/24/2020 1003   CO2 29 04/24/2020 1003   CO2 25 05/26/2017 0856   BUN 16 04/24/2020 1003   BUN 12.7 05/26/2017 0856   CREATININE 1.19  04/24/2020 1003   CREATININE 1.2 05/26/2017 0856      Component Value Date/Time   CALCIUM 10.1 04/24/2020 1003   CALCIUM 9.5 05/26/2017 0856   ALKPHOS 82 04/24/2020 1003   ALKPHOS 83 05/26/2017 0856   AST 11 (L) 04/24/2020 1003   AST 16 05/26/2017 0856   ALT 14 04/24/2020 1003   ALT 22 05/26/2017 0856   BILITOT 0.4 04/24/2020 1003   BILITOT 0.28 05/26/2017 0856       Impression and Plan: Mr. 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   At this point, I really think we can let him go from the practice.  He is doing quite well.  He has had no problems with anemia since 2 years ago.  He can was come back if he thinks he needs to be seen by [place].  We will be more than happy to get him back.  I just do not want to waste his time.  He is quite busy right now.  He is working.  Is looking for a new house.  I just do not want to interfere with his overall quality of life.   Volanda Napoleon, MD 6/1/202110:43 AM

## 2020-05-03 DIAGNOSIS — H40053 Ocular hypertension, bilateral: Secondary | ICD-10-CM | POA: Diagnosis not present

## 2020-05-03 DIAGNOSIS — H04123 Dry eye syndrome of bilateral lacrimal glands: Secondary | ICD-10-CM | POA: Diagnosis not present

## 2020-05-03 DIAGNOSIS — H401132 Primary open-angle glaucoma, bilateral, moderate stage: Secondary | ICD-10-CM | POA: Diagnosis not present

## 2020-05-03 DIAGNOSIS — H18593 Other hereditary corneal dystrophies, bilateral: Secondary | ICD-10-CM | POA: Diagnosis not present

## 2020-05-06 ENCOUNTER — Encounter: Payer: Self-pay | Admitting: Adult Health

## 2020-05-08 ENCOUNTER — Ambulatory Visit (INDEPENDENT_AMBULATORY_CARE_PROVIDER_SITE_OTHER): Payer: 59 | Admitting: Adult Health

## 2020-05-08 ENCOUNTER — Encounter: Payer: Self-pay | Admitting: Adult Health

## 2020-05-08 VITALS — BP 133/74 | HR 98 | Ht 68.0 in | Wt 185.0 lb

## 2020-05-08 DIAGNOSIS — G4733 Obstructive sleep apnea (adult) (pediatric): Secondary | ICD-10-CM

## 2020-05-08 DIAGNOSIS — I10 Essential (primary) hypertension: Secondary | ICD-10-CM

## 2020-05-08 DIAGNOSIS — E1142 Type 2 diabetes mellitus with diabetic polyneuropathy: Secondary | ICD-10-CM

## 2020-05-08 DIAGNOSIS — Z9989 Dependence on other enabling machines and devices: Secondary | ICD-10-CM | POA: Diagnosis not present

## 2020-05-08 DIAGNOSIS — Z8673 Personal history of transient ischemic attack (TIA), and cerebral infarction without residual deficits: Secondary | ICD-10-CM | POA: Diagnosis not present

## 2020-05-08 DIAGNOSIS — E785 Hyperlipidemia, unspecified: Secondary | ICD-10-CM

## 2020-05-08 NOTE — Progress Notes (Signed)
[facility]  PATIENT: Adam THERIEN DOB: 1956-01-13   REASON FOR VISIT: Stroke and CPAP follow-up HISTORY FROM: Patient  Chief Complaint  Patient presents with  . Cerebrovascular Accident    hx multiple strokes.  Residual right-sided numbness which has been stable without worsening  . 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  Today, 05/08/2020, Adam Quinn is being seen for stroke and CPAP follow-up.   Stroke: Patient of Dr. Leonie Man.  Residual deficits of right side lips and right thumb.  Denies new stroke/TIA symptoms.  He questions medication usage for numbness sensation.  Denies painful sensation such as burning, electric, sharp, stinging or itching.  He does have diabetic neuropathy in bilateral distal lower extremities currently well managed on gabapentin 300 mg nightly.  Continues on clopidogrel and simvastatin for secondary stroke prevention.  History of aspirin allergy. He did have recent lipid panel with PCP which was satisfactory per patient report (unable to view via epic).  Blood pressure today 133/74.  Glucose levels stable with recent A1c 5.7 (per patient report). Report recent adjustments to insulin regimen due to episodes of hypoglycemia. Continues to follow closely with PCP Dr. Coral Spikes for HTN, HLD and DM management.  No further concerns at this time.  OSA on CPAP: Patient of Dr. Brett Fairy.  CPAP compliance report from 04/07/2020 -05/06/2020 shows excellent compliance with 30 out of 30 usage days and 30 days greater than 4 hours with average usage 6 hours and 36 minutes.  Residual AHI 2.5 onset pressure 7 cm H2O with EPR level 3.   Leaks in the 95th percentile 13.0 L/min.  Continues to use CPAP without difficulty and tolerating well.  He does plan on following up with DME company [employer] for updated supplies.  No CPAP related concerns at this time.        REVIEW OF SYSTEMS: Full 14 system review of systems performed and notable only for those listed, all others are neg: Numbness, tingling, and sleep apnea  Epworth Sleepiness Scale How likely are you to doze in the following situations:  0 = not likely, 1 = slight chance, 2 = moderate chance, 3 = high chance   Sitting and Reading? 0 Watching Television? 2 Sitting inactive in a public place (theater or meeting)? 0 Lying down in the afternoon when circumstances permit? 2 Sitting and talking to someone? 0 Sitting quietly after lunch without alcohol? 0 In a car, while stopped for a few minutes in traffic? 0 As a passenger in a car for an hour without a break? 0  Total = 4      ALLERGIES: Allergies  Allergen Reactions  . Aspirin Hives  . Shrimp [Shellfish Allergy] Hives    Just shrimp     HOME MEDICATIONS: Outpatient Medications Prior to Visit  Medication Sig Dispense Refill  . clopidogrel (PLAVIX) 75 MG tablet Take 1 tablet (75 mg total) by mouth daily. (Patient taking differently: Take 75 mg by mouth at bedtime. ) 90 tablet 0  . Continuous Blood Gluc Receiver ([place]) [place]     . Continuous Blood Gluc Sensor (DEXCOM G6 SENSOR) MISC USE AS DIRECTED TO CHECK BLOOD SUGAR    .  Continuous Blood Gluc Transmit (DEXCOM G6 TRANSMITTER) MISC AS DIRECTED AS DIRECTED CHANGE EVERY 90 DAYS    . dorzolamide (TRUSOPT) 2 % ophthalmic solution Place 1 drop into both eyes 2 (two) times a day.    Marland Kitchen FREESTYLE LITE test strip   3  . gabapentin (NEURONTIN) 300 MG capsule Take 300 mg by mouth at bedtime.     Marland Kitchen ibuprofen (ADVIL,MOTRIN) 800 MG tablet Take 800 mg by mouth every 8 (eight) hours as needed.    . lansoprazole (PREVACID) 30 MG capsule Take 30 mg by  mouth 2 (two) times daily.   1  . lisinopril (ZESTRIL) 2.5 MG tablet Take 2.5 mg by mouth daily.    Marland Kitchen loratadine-pseudoephedrine (CLARITIN-D 12-HOUR) 5-120 MG tablet Take 1 tablet by mouth at bedtime as needed for allergies. Alavert-D    . LUMIGAN 0.01 % SOLN Place 1 drop into both eyes at bedtime.  3  . Melatonin 10 MG TABS Take 10 mg by mouth at bedtime.    . metFORMIN (GLUCOPHAGE-XR) 500 MG 24 hr tablet Take 2,000 mg by mouth daily with breakfast.     . PENTIPS 31G X 5 MM MISC USE TO ADMINISTER VICTOZA ONCE DAILY    . Probiotic Product (RA PROBIOTIC GUMMIES PO) Take 1 capsule by mouth at bedtime.    . sertraline (ZOLOFT) 100 MG tablet Take 100 mg by mouth daily.   1  . simvastatin (ZOCOR) 20 MG tablet Take 20 mg by mouth every evening.    . sodium bicarbonate 650 MG tablet Take 1 tablet (650 mg total) by mouth 2 (two) times daily. 60 tablet 3  . Travoprost, BAK Free, (TRAVATAN) 0.004 % SOLN ophthalmic solution Place 1 drop into both eyes nightly.    . TRESIBA FLEXTOUCH 200 UNIT/ML FlexTouch Pen SMARTSIG:50 Unit(s) SUB-Q Daily    . TRULICITY 1.5 HA/1.9FX SOPN USE AS DIRECTED ONCE WEEKLY     No facility-administered medications prior to visit.    PAST MEDICAL HISTORY: Past Medical History:  Diagnosis Date  . Allergy    seasonal  . Anxiety   . Cataract    cataract surgery bilaterally resolved issues  . Central scotoma   . Chronic kidney disease    small protien showing uses Lisinopril for this  . Diverticulitis   . Diverticulosis   . DM (diabetes mellitus) ([place])   . GERD (gastroesophageal reflux disease)   . Glaucoma   . History of colon polyps   . History of hiatal hernia   . History of kidney stones    x1  . Iron deficiency anemia    Iron infusion 04/29/2019  . Neuropathy   . OSA on CPAP   . Stroke ([place])   . Wears glasses    READING    PAST SURGICAL HISTORY: Past Surgical History:  Procedure Laterality Date  . cataract surgery     bilateral  . COLONOSCOPY    .  COLONOSCOPY N/A 06/04/2015   Procedure: COLONOSCOPY;  Surgeon: Inda Castle, MD;  Location: [facility];  Service: Endoscopy;  Laterality: N/A;  . COLONOSCOPY WITH PROPOFOL N/A 05/09/2019   Procedure: COLONOSCOPY WITH PROPOFOL;  Surgeon: Doran Stabler, MD;  Location: [facility];  Service: Gastroenterology;  Laterality: N/A;  . ESOPHAGOGASTRODUODENOSCOPY N/A 06/04/2015   Procedure: ESOPHAGOGASTRODUODENOSCOPY (EGD);  Surgeon: Inda Castle, MD;  Location: [facility];  Service: Endoscopy;  Laterality: N/A;  . UPPER GASTROINTESTINAL ENDOSCOPY    . WISDOM TOOTH EXTRACTION     with sedation  FAMILY HISTORY: Family History  Problem Relation Age of Onset  . Colon polyps Father   . Lung cancer Father   . Diabetes Mother   . Dementia Mother   . Diabetes Sister        x 2  . Stroke Maternal Grandmother   . Stroke Paternal Grandfather   . Colon cancer Neg Hx   . Rectal cancer Neg Hx   . Stomach cancer Neg Hx   . Pancreatic cancer Neg Hx     SOCIAL HISTORY: Social History   Socioeconomic History  . Marital status: Married    Spouse name: Not on file  . Number of children: 3  . Years of education: Not on file  . Highest education level: Not on file  Occupational History  . Occupation: Optometrist  Tobacco Use  . Smoking status: Former Smoker    Quit date: 10/04/1997    Years since quitting: 22.6  . Smokeless tobacco: Never Used  Vaping Use  . Vaping Use: Never used  Substance and Sexual Activity  . Alcohol use: No  . Drug use: No  . Sexual activity: Not on file  Other Topics Concern  . Not on file  Social History Narrative  . Not on file   Social Determinants of Health   Financial Resource Strain:   . Difficulty of Paying Living Expenses:   Food Insecurity:   . Worried About Charity fundraiser in the Last Year:   . Arboriculturist in the Last Year:   Transportation Needs:   . Film/video editor (Medical):   Marland Kitchen Lack of Transportation (Non-Medical):     Physical Activity:   . Days of Exercise per Week:   . Minutes of Exercise per Session:   Stress:   . Feeling of Stress :   Social Connections:   . Frequency of Communication with Friends and Family:   . Frequency of Social Gatherings with Friends and Family:   . Attends Religious Services:   . Active Member of Clubs or Organizations:   . Attends Archivist Meetings:   Marland Kitchen Marital Status:   Intimate Partner Violence:   . Fear of Current or Ex-Partner:   . Emotionally Abused:   Marland Kitchen Physically Abused:   . Sexually Abused:      PHYSICAL EXAM  Vitals:   05/08/20 0926  BP: 133/74  Pulse: 98  Weight: 185 lb (83.9 kg)  Height: 5\' 8"  (1.727 m)   Body mass index is 28.13 kg/m.  Generalized: Obese middle-aged Caucasian male in no acute distress  Head: normocephalic and atraumatic   Neck: Supple, no carotid bruits  Cardiac: Regular rate rhythm, no murmur  Musculoskeletal: No deformity   Neurological examination   Mentation: Alert oriented to time, place, Attention span and concentration appropriate. Recent and remote memory intact.  Follows all commands speech and language fluent.  Cranial nerve II-XII: Pupils were equal round reactive to light extraocular movements were full, visual field were full on confrontational test. Facial sensation and strength were normal. hearing was intact to finger rubbing bilaterally. Uvula tongue midline. head turning and shoulder shrug were normal and symmetric.Tongue protrusion into cheek strength was normal. Motor: normal bulk and tone, full strength in the BUE, BLE, Sensory: normal and symmetric to light touch, in the upper and lower extremities Coordination: finger-nose-finger, heel-to-shin bilaterally, no dysmetria Reflexes: 1+ upper lower and symmetric, plantar responses were flexor bilaterally. Gait and Station: Rising up from seated position without assistance, normal  stance,  moderate stride, good arm swing, smooth turning, able to  perform tiptoe, and heel walking without difficulty. Tandem gait is performed with moderate difficulty     ASSESSMENT AND PLAN 64 year Caucasian male with history of multiple bilateral subcortical lacunar infarcts in 2017, left thalamic infarct 06/2019 secondary to small vessel disease with residual right-sided numbness, vertebral artery stenosis, DM, HTN, HLD and OSA on CPAP.  Continues to be followed in this office for stroke and CPAP follow-up    History of multiple strokes: -Residual right lip and right hand numbness/tingling.  He questions possible benefit with use of medication.  Advised as symptoms are purely numbness and are not painful, medication use would likely not provide any benefit.  Remains on gabapentin 300 mg nightly for diabetic neuropathy.  Advised to continue to monitor residual stroke deficits and if they become painful with burning, stinging, electrical shock or itching sensation may consider increasing gabapentin dosage at that time. -Continue Plavix and simvastatin for secondary stroke prevention -Continue to follow with PCP for HTN, HLD and DM management -Chronic vertebral artery stenosis with prior CUS 07/2019 no evidence of stenosis bilateral ICA with right vertebral artery antegrade flow in left vertebral artery not visualized.  Remains asymptomatic and advised importance of continuing clopidogrel and statin along with managing stroke risk factors and ongoing use of CPAP.  No indication at this time for repeat or surveillance monitoring as stenosis is chronic and he is currently asymptomatic. -maintain strict control of hypertension with blood pressure goal below 130/90, diabetes with hemoglobin A1c goal below 6.5% and lipids with LDL cholesterol goal below 70 mg/dL. I also advised the patient to eat a healthy diet with plenty of whole grains, cereals, fruits and vegetables, exercise regularly and maintain ideal body weight.  OSA on CPAP: -Encouraged ongoing excellent  compliance with CPAP with optimal residual AHI of 2.5 -No change to settings at this time as not indicated -Advised to continue to follow with DME company [employer] for any needed supplies or CPAP related concerns   Follow-up in 1 year or call earlier if needed   I spent 35 minutes of face-to-face and non-face-to-face time with patient.  This included previsit chart review, lab review, study review, order entry, electronic health record documentation, patient education regarding prior stroke history, residual deficits, importance of managing stroke risk factors, review of CPAP compliance report and further discussion and answered all questions to patient satisfaction     Frann Rider, AGNP-BC  [facility] [address] [place], [place] [address]  Phone [phone] Fax [phone] Note: This document was prepared with digital dictation and possible smart phrase technology. Any transcriptional errors that result from this process are unintentional.

## 2020-05-08 NOTE — Patient Instructions (Signed)
Ongoing compliance with CPAP for sleep apnea management -continue to work with DME company for any needed supplies or CPAP related concerns  Continue clopidogrel 75 mg daily  and Crestor for secondary stroke prevention  Continue to follow up with PCP regarding cholesterol, blood pressure and diabetes management   Maintain strict control of hypertension with blood pressure goal below 130/90, diabetes with hemoglobin A1c goal below 6.5% and cholesterol with LDL cholesterol (bad cholesterol) goal below 70 mg/dL. I also advised the patient to eat a healthy diet with plenty of whole grains, cereals, fruits and vegetables, exercise regularly and maintain ideal body weight.  Followup in the future with me in 1 year or call earlier if needed       Thank you for coming to see [place] at [facility]. I hope we have been able to provide you high quality care today.  You may receive a patient satisfaction survey over the next few weeks. We would appreciate your feedback and comments so that we may continue to improve ourselves and the health of our patients.

## 2020-05-08 NOTE — Addendum Note (Signed)
Addended by: Mal Misty on: 05/08/2020 10:01 AM   Modules accepted: Orders

## 2020-05-11 NOTE — Progress Notes (Signed)
I agree with the above plan 

## 2020-06-20 ENCOUNTER — Ambulatory Visit: Payer: 59 | Admitting: Podiatry

## 2020-06-28 ENCOUNTER — Other Ambulatory Visit (HOSPITAL_BASED_OUTPATIENT_CLINIC_OR_DEPARTMENT_OTHER): Payer: Self-pay | Admitting: Family Medicine

## 2020-07-03 ENCOUNTER — Other Ambulatory Visit: Payer: Self-pay | Admitting: General Practice

## 2020-07-03 NOTE — Patient Outreach (Signed)
[facility]) Care Management  07/03/2020  Adam Quinn July 07, 1956 [id number]   Client is newly enrolled in the Special Needs Plan program with Type II Diabetes. Individualized Care Plan (ICP) completed with information from the Health Risk Assessment on file. Client also has a history of Hypertension, Stroke and Chronic Kidney Disease per the Electronic Medical Records. Will send an introductory letter with ICP to the primary provider and client, along with educational materials. No recent ED or acute care admissions in 2021. Assigned RN Care Coordinator will follow up within 3 months.

## 2020-07-10 ENCOUNTER — Other Ambulatory Visit (HOSPITAL_BASED_OUTPATIENT_CLINIC_OR_DEPARTMENT_OTHER): Payer: Self-pay | Admitting: Family Medicine

## 2020-07-17 ENCOUNTER — Ambulatory Visit: Payer: Self-pay

## 2020-08-08 ENCOUNTER — Other Ambulatory Visit: Payer: Self-pay

## 2020-08-08 NOTE — Patient Outreach (Addendum)
[facility]) Care Management Chronic Special Needs Program  08/08/2020  Name: Adam Quinn DOB: [birth year]  MRN: [id number]  [name] is enrolled in a chronic special needs plan for Diabetes. Chronic Care Management Coordinator telephoned client to review health risk assessment and to develop individualized care plan. HIPAA verified with client.  Introduced the chronic care management program, importance of client participation, and taking their care plan to all provider appointments and inpatient facilities.  Client states he is doing well. He reports having his annual visit with his primary care provider on 02/23/20. Client states he has transportation to his appointment.   Client states he is able to afford his medications and takes them as prescribed. Client states he checks his blood sugar several times a day due to have the dexcom G6.  Reports having low blood sugars 2-3 times per week. Client states his doctor is aware and has not made any changes to his treatment plan.    Goals Addressed              This Visit's Progress   .  Client understands the importance of follow-up with providers by attending scheduled visits        Continue to follow up with Dr. Leonie Quinn and Dr. Marin Quinn Primary care provider visit 02/23/20 and 08/25/19 Podiatry visit 03/21/20 Continue to follow up with your providers as recommended.     Marland Kitchen  HEMOGLOBIN A1C < 7 (pt-stated)        Congratulations, your A1C is below goal at 5.7  Check blood sugars daily before eating with a goal of 80-130. You can also check  1 1/2 hours after eating with goal of 180 or less. Plan to eat low carbohydrate and low salt meals, watch portion sizes and avoid sugar sweetened drinks.  Review [employer] calendar sent in the mail for Diabetes Action Plan. Please follow the RULE OF 15 for the treatment of hypoglycemia treatment (When your blood sugars are less than 70 mg/ dl) STEP  1:  Take 15 grams of  carbohydrates when your blood sugar is low, which includes:   3-4 glucose tabs or  3-4 oz of juice or regular soda or  One tube of glucose gel STEP 2:  Recheck blood sugar in 15 minutes STEP 3:  If your blood sugar is still low at the 15 minute recheck ---then, go back to STEP 1 and treat again with another 15 grams of carbohydrates    .  Maintain timely refills of diabetic medication as prescribed within the year .        Client reports maintaining timely refills of his diabetic medication Contact your RN case manager if you have difficulty obtaining your medication.     .  COMPLETED: Obtain annual  Lipid Profile, LDL-C   On track     Lipid profile completed 02/23/20    .  COMPLETED: Obtain Annual Eye (retinal)  Exam         Client reports having eye exam a couple of months ago. Emmi Education provided on Diabetic Retinopathy in quarterly education mailing.      .  COMPLETED: Obtain Annual Foot Exam        Annual foot exam 02/23/20    .  Obtain annual screen for micro albuminuria (urine) , nephropathy (kidney problems)        Annual micro albuminuria completed 08/25/19 You have a history of Chronic Kidney Disease. It is important for your doctor to  check your urine at least once a year. These tests show how your kidneys are working. Emmi Education provided on Diabetic Nephropathy in quarterly education mailing. Review and plan to discuss with RN during next telephonic assessment.    Illa Level Hemoglobin A1C at least 2 times per year        Hgb A1c completed 02/23/20  5.7 Follow up with your doctor regularly and have lab work completed as recommended.                .  Visit Primary Care Provider or Endocrinologist at least 2 times per year         Continue to follow up with appointments with Dr. Leonie Quinn, Dr. Marin Quinn, Dr. Prudence Quinn and Dr. Hassell Quinn this year. Primary care provider visit 02/23/20 and 08/25/19 Podiatry visit 03/21/20 Continue to follow up with your providers as recommended.         Plan:  Send successful outreach letter with a copy of their individualized care plan and Send individual care plan to provider  Chronic care management coordination will outreach in: 12 month     Quinn Plowman RN,BSN,CCM [facility] [phone]

## 2020-08-14 ENCOUNTER — Other Ambulatory Visit (HOSPITAL_BASED_OUTPATIENT_CLINIC_OR_DEPARTMENT_OTHER): Payer: Self-pay | Admitting: Family Medicine

## 2020-08-22 ENCOUNTER — Ambulatory Visit (INDEPENDENT_AMBULATORY_CARE_PROVIDER_SITE_OTHER): Payer: 59 | Admitting: Podiatry

## 2020-08-22 ENCOUNTER — Encounter: Payer: Self-pay | Admitting: Podiatry

## 2020-08-22 ENCOUNTER — Other Ambulatory Visit: Payer: Self-pay

## 2020-08-22 DIAGNOSIS — D689 Coagulation defect, unspecified: Secondary | ICD-10-CM

## 2020-08-22 DIAGNOSIS — M79675 Pain in left toe(s): Secondary | ICD-10-CM | POA: Diagnosis not present

## 2020-08-22 DIAGNOSIS — B351 Tinea unguium: Secondary | ICD-10-CM

## 2020-08-22 DIAGNOSIS — M79674 Pain in right toe(s): Secondary | ICD-10-CM | POA: Diagnosis not present

## 2020-08-22 DIAGNOSIS — E1142 Type 2 diabetes mellitus with diabetic polyneuropathy: Secondary | ICD-10-CM

## 2020-08-22 NOTE — Progress Notes (Signed)
This patient returns to my office for at risk foot care.  This patient requires this care by a professional since this patient will be at risk due to having diabetic neuropathy anc coagulation defect.  This patient is unable to cut nails himself since the patient cannot reach his nails.These nails are painful walking and wearing shoes.  This patient presents for at risk foot care today.  General Appearance  Alert, conversant and in no acute stress.  Vascular  Dorsalis pedis and posterior tibial  pulses are palpable  bilaterally.  Capillary return is within normal limits  bilaterally. Temperature is within normal limits  bilaterally.  Neurologic  Senn-Weinstein monofilament wire test absent  bilaterally. Muscle power within normal limits bilaterally.  Nails Thick disfigured discolored nails with subungual debris  Hallux nails  B/L. No evidence of bacterial infection or drainage bilaterally.  Orthopedic  No limitations of motion  feet .  No crepitus or effusions noted.  Hammer toes 2-4  B/L. Hallux malleus  B/L  Skin  normotropic skin with no porokeratosis noted bilaterally.  No signs of infections or ulcers noted.     Onychomycosis  Pain in right toes  Pain in left toes  Consent was obtained for treatment procedures.   Mechanical debridement of hallux nails  bilaterally performed with a nail nipper.  Filed with dremel without incident.    Return office visit    3 months                  Told patient to return for periodic foot care and evaluation due to potential at risk complications.   Gardiner Barefoot DPM

## 2020-08-28 ENCOUNTER — Encounter: Payer: Self-pay | Admitting: Oncology

## 2020-08-28 ENCOUNTER — Other Ambulatory Visit: Payer: Self-pay | Admitting: Oncology

## 2020-08-28 DIAGNOSIS — U071 COVID-19: Secondary | ICD-10-CM

## 2020-08-28 NOTE — Progress Notes (Signed)
I connected by phone with Adam Quinn to discuss the potential use of an new treatment for mild to moderate COVID-19 viral infection in non-hospitalized patients.   This patient is a age/sex that meets the FDA criteria for Emergency Use Authorization of casirivimab\imdevimab.  Has a (+) direct SARS-CoV-2 viral test result 1. Has mild or moderate COVID-19  2. Is ? 66 years of age and weighs ? 40 kg 3. Is NOT hospitalized due to COVID-19 4. Is NOT requiring oxygen therapy or requiring an increase in baseline oxygen flow rate due to COVID-19 5. Is within 10 days of symptom onset 6. Has at least one of the high risk factor(s) for progression to severe COVID-19 and/or hospitalization as defined in EUA. Specific high risk criteria : Past Medical History:  Diagnosis Date  . Allergy    seasonal  . Anxiety   . Cataract    cataract surgery bilaterally resolved issues  . Central scotoma   . Chronic kidney disease    small protien showing uses Lisinopril for this  . Diverticulitis   . Diverticulosis   . DM (diabetes mellitus) ([place])   . GERD (gastroesophageal reflux disease)   . Glaucoma   . History of colon polyps   . History of hiatal hernia   . History of kidney stones    x1  . Iron deficiency anemia    Iron infusion 04/29/2019  . Neuropathy   . OSA on CPAP   . Stroke ([place])   . Wears glasses    READING  ?  ?    Symptom onset  08/26/2020   I have spoken and communicated the following to the patient or parent/caregiver:   1. FDA has authorized the emergency use of casirivimab\imdevimab for the treatment of mild to moderate COVID-19 in adults and pediatric patients with positive results of direct SARS-CoV-2 viral testing who are 43 years of age and older weighing at least 40 kg, and who are at high risk for progressing to severe COVID-19 and/or hospitalization.   2. The significant known and potential risks and benefits of casirivimab\imdevimab, and the extent to which such potential  risks and benefits are unknown.   3. Information on available alternative treatments and the risks and benefits of those alternatives, including clinical trials.   4. Patients treated with casirivimab\imdevimab should continue to self-isolate and use infection control measures (e.g., wear mask, isolate, social distance, avoid sharing personal items, clean and disinfect "high touch" surfaces, and frequent handwashing) according to CDC guidelines.    5. The patient or parent/caregiver has the option to accept or refuse casirivimab\imdevimab .   After reviewing this information with the patient, The patient agreed to proceed with receiving casirivimab\imdevimab infusion and will be provided a copy of the Fact sheet prior to receiving the infusion.Rulon Abide, AGNP-C [phone] ([place])

## 2020-08-29 ENCOUNTER — Ambulatory Visit (HOSPITAL_COMMUNITY)
Admission: RE | Admit: 2020-08-29 | Discharge: 2020-08-29 | Disposition: A | Discharge status: Home / Self Care | Payer: 59 | Source: Ambulatory Visit | Attending: Pulmonary Disease | Admitting: Pulmonary Disease

## 2020-08-29 DIAGNOSIS — U071 COVID-19: Secondary | ICD-10-CM | POA: Diagnosis not present

## 2020-08-29 DIAGNOSIS — Z20828 Contact with and (suspected) exposure to other viral communicable diseases: Secondary | ICD-10-CM | POA: Diagnosis not present

## 2020-08-29 MED ORDER — DIPHENHYDRAMINE HCL 50 MG/ML IJ SOLN: 50.0000 mg | Freq: Once | INTRAMUSCULAR | Status: DC | PRN

## 2020-08-29 MED ORDER — SODIUM CHLORIDE 0.9 % IV SOLN: INTRAVENOUS | Status: DC | PRN

## 2020-08-29 MED ORDER — EPINEPHRINE 0.3 MG/0.3ML IJ SOAJ: 0.3000 mg | Freq: Once | INTRAMUSCULAR | Status: DC | PRN

## 2020-08-29 MED ORDER — METHYLPREDNISOLONE SODIUM SUCC 125 MG IJ SOLR: 125.0000 mg | Freq: Once | INTRAMUSCULAR | Status: DC | PRN

## 2020-08-29 MED ORDER — SODIUM CHLORIDE 0.9 % IV BOLUS
1000.0000 mL | Freq: Once | INTRAVENOUS | Status: AC
  Administered 2020-08-29: 1000 mL via INTRAVENOUS

## 2020-08-29 MED ORDER — FAMOTIDINE IN NACL 20-0.9 MG/50ML-% IV SOLN: 20.0000 mg | Freq: Once | INTRAVENOUS | Status: DC | PRN

## 2020-08-29 MED ORDER — ALBUTEROL SULFATE HFA 108 (90 BASE) MCG/ACT IN AERS: 2.0000 | INHALATION_SPRAY | Freq: Once | RESPIRATORY_TRACT | Status: DC | PRN

## 2020-08-29 MED ORDER — SODIUM CHLORIDE 0.9 % IV SOLN
1200.0000 mg | Freq: Once | INTRAVENOUS | Status: AC
  Administered 2020-08-29: 1200 mg via INTRAVENOUS

## 2020-08-29 NOTE — Discharge Instructions (Signed)

## 2020-08-29 NOTE — Progress Notes (Signed)
  Diagnosis: COVID-19   Physician: Asencion Noble   Procedure: [facility] Infusion [facility] Med: casirivimab\imdevimab infusion - Provided patient with casirivimab\imdevimab fact sheet for patients, parents and caregivers prior to infusion.  Complications: No immediate complications noted.  Discharge: Discharged home   [place] 08/29/2020

## 2020-09-01 ENCOUNTER — Other Ambulatory Visit: Payer: Self-pay

## 2020-09-01 ENCOUNTER — Emergency Department (HOSPITAL_BASED_OUTPATIENT_CLINIC_OR_DEPARTMENT_OTHER)
Admission: EM | Admit: 2020-09-01 | Discharge: 2020-09-01 | Disposition: A | Discharge status: Home / Self Care | Payer: 59 | Attending: Emergency Medicine | Admitting: Emergency Medicine

## 2020-09-01 ENCOUNTER — Encounter (HOSPITAL_BASED_OUTPATIENT_CLINIC_OR_DEPARTMENT_OTHER): Payer: Self-pay | Admitting: Emergency Medicine

## 2020-09-01 ENCOUNTER — Emergency Department (HOSPITAL_BASED_OUTPATIENT_CLINIC_OR_DEPARTMENT_OTHER): Payer: 59

## 2020-09-01 DIAGNOSIS — Z87891 Personal history of nicotine dependence: Secondary | ICD-10-CM | POA: Insufficient documentation

## 2020-09-01 DIAGNOSIS — E860 Dehydration: Secondary | ICD-10-CM | POA: Diagnosis not present

## 2020-09-01 DIAGNOSIS — U071 COVID-19: Secondary | ICD-10-CM | POA: Diagnosis not present

## 2020-09-01 DIAGNOSIS — N189 Chronic kidney disease, unspecified: Secondary | ICD-10-CM | POA: Insufficient documentation

## 2020-09-01 DIAGNOSIS — E1169 Type 2 diabetes mellitus with other specified complication: Secondary | ICD-10-CM | POA: Insufficient documentation

## 2020-09-01 DIAGNOSIS — B342 Coronavirus infection, unspecified: Secondary | ICD-10-CM | POA: Diagnosis not present

## 2020-09-01 DIAGNOSIS — I129 Hypertensive chronic kidney disease with stage 1 through stage 4 chronic kidney disease, or unspecified chronic kidney disease: Secondary | ICD-10-CM | POA: Insufficient documentation

## 2020-09-01 DIAGNOSIS — E1139 Type 2 diabetes mellitus with other diabetic ophthalmic complication: Secondary | ICD-10-CM | POA: Insufficient documentation

## 2020-09-01 DIAGNOSIS — Z79899 Other long term (current) drug therapy: Secondary | ICD-10-CM | POA: Diagnosis not present

## 2020-09-01 DIAGNOSIS — E1122 Type 2 diabetes mellitus with diabetic chronic kidney disease: Secondary | ICD-10-CM | POA: Diagnosis not present

## 2020-09-01 DIAGNOSIS — Z7984 Long term (current) use of oral hypoglycemic drugs: Secondary | ICD-10-CM | POA: Diagnosis not present

## 2020-09-01 DIAGNOSIS — Z86010 Personal history of colonic polyps: Secondary | ICD-10-CM | POA: Diagnosis not present

## 2020-09-01 DIAGNOSIS — R5383 Other fatigue: Secondary | ICD-10-CM | POA: Diagnosis present

## 2020-09-01 DIAGNOSIS — R531 Weakness: Secondary | ICD-10-CM

## 2020-09-01 DIAGNOSIS — E785 Hyperlipidemia, unspecified: Secondary | ICD-10-CM | POA: Diagnosis not present

## 2020-09-01 DIAGNOSIS — E1140 Type 2 diabetes mellitus with diabetic neuropathy, unspecified: Secondary | ICD-10-CM | POA: Diagnosis not present

## 2020-09-01 LAB — COMPREHENSIVE METABOLIC PANEL
ALT: 20 U/L (ref 0–44)
AST: 20 U/L (ref 15–41)
Albumin: 3.4 g/dL — ABNORMAL LOW (ref 3.5–5.0)
Alkaline Phosphatase: 66 U/L (ref 38–126)
Anion gap: 14 (ref 5–15)
BUN: 23 mg/dL (ref 8–23)
CO2: 18 mmol/L — ABNORMAL LOW (ref 22–32)
Calcium: 9 mg/dL (ref 8.9–10.3)
Chloride: 105 mmol/L (ref 98–111)
Creatinine, Ser: 1.07 mg/dL (ref 0.61–1.24)
GFR, Estimated: >60 mL/min (ref >60)
Glucose, Bld: 82 mg/dL (ref 70–99)
Potassium: 4.6 mmol/L (ref 3.5–5.1)
Sodium: 137 mmol/L (ref 135–145)
Total Bilirubin: 0.9 mg/dL (ref 0.3–1.2)
Total Protein: 7.1 g/dL (ref 6.5–8.1)

## 2020-09-01 LAB — CBC WITH DIFFERENTIAL/PLATELET
Abs Immature Granulocytes: 0.04 10*3/uL (ref 0.00–0.07)
Basophils Absolute: 0 10*3/uL (ref 0.0–0.1)
Basophils Relative: 0 %
Eosinophils Absolute: 0.1 10*3/uL (ref 0.0–0.5)
Eosinophils Relative: 2 %
HCT: 44.4 % (ref 39.0–52.0)
Hemoglobin: 14.9 g/dL (ref 13.0–17.0)
Immature Granulocytes: 1 %
Lymphocytes Relative: 8 %
Lymphs Abs: 0.6 10*3/uL — ABNORMAL LOW (ref 0.7–4.0)
MCH: 30.5 pg (ref 26.0–34.0)
MCHC: 33.6 g/dL (ref 30.0–36.0)
MCV: 91 fL (ref 80.0–100.0)
Monocytes Absolute: 0.6 10*3/uL (ref 0.1–1.0)
Monocytes Relative: 9 %
Neutro Abs: 5.6 10*3/uL (ref 1.7–7.7)
Neutrophils Relative %: 80 %
Platelets: 219 10*3/uL (ref 150–400)
RBC: 4.88 MIL/uL (ref 4.22–5.81)
RDW: 13.2 % (ref 11.5–15.5)
WBC: 6.9 10*3/uL (ref 4.0–10.5)
nRBC: 0 % (ref 0.0–0.2)

## 2020-09-01 LAB — URINALYSIS, ROUTINE W REFLEX MICROSCOPIC
Glucose, UA: NEGATIVE mg/dL
Ketones, ur: 15 mg/dL — AB
Leukocytes,Ua: NEGATIVE
Nitrite: NEGATIVE
Protein, ur: 30 mg/dL — AB
Specific Gravity, Urine: 1.025 (ref 1.005–1.030)
pH: 6 (ref 5.0–8.0)

## 2020-09-01 LAB — URINALYSIS, MICROSCOPIC (REFLEX)

## 2020-09-01 MED ORDER — LACTATED RINGERS IV BOLUS
1000.0000 mL | Freq: Once | INTRAVENOUS | Status: AC
  Administered 2020-09-01: 1000 mL via INTRAVENOUS

## 2020-09-01 MED ORDER — ONDANSETRON 4 MG PO TBDP: 4.0000 mg | ORAL_TABLET | Freq: Three times a day (TID) | ORAL | 0 refills | Status: DC | PRN

## 2020-09-01 MED ORDER — ONDANSETRON HCL 4 MG/2ML IJ SOLN
4.0000 mg | Freq: Once | INTRAMUSCULAR | Status: AC
  Administered 2020-09-01: 4 mg via INTRAVENOUS
  Filled 2020-09-01: qty 2

## 2020-09-01 NOTE — ED Notes (Signed)
Provided fluids for po challenge

## 2020-09-01 NOTE — ED Notes (Signed)
Pt tolerating po fluids, no further nausea/vomiting

## 2020-09-01 NOTE — Discharge Instructions (Signed)
  General Viral Syndrome Care Instructions:  Your symptoms are expected with illness is caused by viruses, including COVID-19.  Viruses do not require or respond to antibiotics. Treatment is symptomatic care and it is important to note that these symptoms may last for 7-14 days.   For your diabetes, it may be necessary to hold your Metformin and insulin for the next couple days until you are able to eat and your blood sugar is consistently higher and more stable.  Hand washing: Wash your hands throughout the day, but especially before and after touching the face, using the restroom, sneezing, coughing, or touching surfaces that have been coughed or sneezed upon. Hydration: Symptoms of most illnesses will be intensified and complicated by dehydration. Dehydration can also extend the duration of symptoms. Drink plenty of fluids and get plenty of rest. You should be drinking at least half a liter of water an hour to stay hydrated. Electrolyte drinks (ex. Gatorade, Powerade, Pedialyte) are also encouraged. You should be drinking enough fluids to make your urine light yellow, almost clear. If this is not the case, you are not drinking enough water. Please note that some of the treatments indicated below will not be effective if you are not adequately hydrated. Diet: Please concentrate on hydration, however, you may introduce food slowly.  Start with a clear liquid diet, progressed to a full liquid diet, and then bland solids as you are able. Pain or fever: Acetaminophen (generic for Tylenol) Acetaminophen: May take acetaminophen (generic for Tylenol), as needed, for pain. Your daily total maximum amount of acetaminophen from all sources should be limited to 4000mg /day for persons without liver problems, or 2000mg /day for those with liver problems. Nausea/vomiting: Use the ondansetron (generic for Zofran) for nausea or vomiting.  This medication may not prevent all vomiting or nausea, but can help facilitate  better hydration. Things that can help with nausea/vomiting also include peppermint/menthol candies, vitamin B12, and ginger. Diarrhea: May use medications such as loperamide (Imodium) or Bismuth subsalicylate (Pepto-Bismol). Cough: Use the benzonatate (generic for Tessalon) for cough.  Teas, warm liquids, broths, and honey can also help with cough. Albuterol: May use the albuterol as needed for instances of shortness of breath. Prednisone: Take the prednisone, as directed, in its entirety. Zyrtec or Claritin: May add these medication daily to control underlying symptoms of congestion, sneezing, and other signs of allergies.  These medications are available over-the-counter. Generics: Cetirizine (generic for Zyrtec) and loratadine (generic for Claritin). Fluticasone: Use fluticasone (generic for Flonase), as directed, for nasal and sinus congestion.  This medication is available over-the-counter. Congestion: Plain guaifenesin (generic for plain Mucinex) may help relieve congestion. Saline sinus rinses and saline nasal sprays may also help relieve congestion.  Sore throat: Warm liquids or Chloraseptic spray may help soothe a sore throat. Gargle twice a day with a salt water solution made from a half teaspoon of salt in a cup of warm water.  Follow up: Follow up with a primary care provider within the next two weeks should symptoms fail to resolve. Return: Return to the ED for significantly worsening symptoms, shortness of breath, persistent vomiting, large amounts of blood in stool, or any other major concerns.  For prescription assistance, may try using prescription discount sites or apps, such as goodrx.com

## 2020-09-01 NOTE — ED Triage Notes (Signed)
COVID+, antibody infusion Wednesday. C/o not feeling well. Denies SOB

## 2020-09-01 NOTE — ED Notes (Signed)
Pt reports improvement in nausea and headache.

## 2020-09-01 NOTE — ED Provider Notes (Signed)
[facility] Provider Note   CSN: [id number] Arrival date & time: 09/01/20  1326     History Chief Complaint  Patient presents with  . Weakness    COVID+    Adam Quinn is a 64 y.o. male.  HPI      Adam Quinn is a 64 y.o. male, with a history of DM, anemia, GERD, stroke, HTN, presenting to the ED with fatigue, generalized weakness, cough, chills for about a week. Accompanied by nausea, vomiting, occasional shortness of breath.  Diagnosed with COVID Wed Oct 5. MAB infusion Oct 6. Vaccinated for Covid. Denies known fever, diarrhea, abdominal pain, chest pain, syncope, dizziness, lower extremity edema/pain, urinary symptoms, or any other complaints.   Past Medical History:  Diagnosis Date  . Allergy    seasonal  . Anxiety   . Cataract    cataract surgery bilaterally resolved issues  . Central scotoma   . Chronic kidney disease    small protien showing uses Lisinopril for this  . Diverticulitis   . Diverticulosis   . DM (diabetes mellitus) ([place])   . GERD (gastroesophageal reflux disease)   . Glaucoma   . History of colon polyps   . History of hiatal hernia   . History of kidney stones    x1  . Iron deficiency anemia    Iron infusion 04/29/2019  . Neuropathy   . OSA on CPAP   . Stroke ([place])   . Wears glasses    READING    Patient Active Problem List   Diagnosis Date Noted  . Pain due to onychomycosis of toenails of both feet 12/21/2019  . Diabetic neuropathy ([place]) 12/21/2019  . Coagulation disorder ([place]) 12/21/2019  . Stroke ([place]) 06/27/2019  . Depression 06/27/2019  . Elevated hemoglobin ([place]) 04/05/2018  . Iron deficiency anemia due to chronic blood loss 05/26/2017  . Malabsorption of iron 05/26/2017  . HTN (hypertension) 03/30/2017  . Hyperlipemia 03/30/2017  . OSA on CPAP 03/30/2017  . Snoring 07/08/2016  . Uncontrolled REM sleep behavior disorder 07/08/2016  . Lacunar infarct, acute ([place]) 06/26/2016  .  Numbness 06/26/2016  . Occlusion and stenosis of vertebral artery 06/26/2016  . Benign neoplasm of cecum 06/04/2015  . Diverticulosis of colon without hemorrhage 06/04/2015  . Internal hemorrhoids 06/04/2015  . Gastritis and gastroduodenitis 06/04/2015  . Microcytic anemia 04/13/2015  . Esophageal reflux 10/04/2012  . Diabetes mellitus without complication ([place]) 42/68/3419  . CONSTIPATION 03/16/2008    Past Surgical History:  Procedure Laterality Date  . cataract surgery     bilateral  . COLONOSCOPY    . COLONOSCOPY N/A 06/04/2015   Procedure: COLONOSCOPY;  Surgeon: Inda Castle, MD;  Location: [facility];  Service: Endoscopy;  Laterality: N/A;  . COLONOSCOPY WITH PROPOFOL N/A 05/09/2019   Procedure: COLONOSCOPY WITH PROPOFOL;  Surgeon: Doran Stabler, MD;  Location: [facility];  Service: Gastroenterology;  Laterality: N/A;  . ESOPHAGOGASTRODUODENOSCOPY N/A 06/04/2015   Procedure: ESOPHAGOGASTRODUODENOSCOPY (EGD);  Surgeon: Inda Castle, MD;  Location: [facility];  Service: Endoscopy;  Laterality: N/A;  . UPPER GASTROINTESTINAL ENDOSCOPY    . WISDOM TOOTH EXTRACTION     with sedation       Family History  Problem Relation Age of Onset  . Colon polyps Father   . Lung cancer Father   . Diabetes Mother   . Dementia Mother   . Diabetes Sister        x 2  . Stroke Maternal  Grandmother   . Stroke Paternal Grandfather   . Colon cancer Neg Hx   . Rectal cancer Neg Hx   . Stomach cancer Neg Hx   . Pancreatic cancer Neg Hx     Social History   Tobacco Use  . Smoking status: Former Smoker    Quit date: 10/04/1997    Years since quitting: 22.9  . Smokeless tobacco: Never Used  Vaping Use  . Vaping Use: Never used  Substance Use Topics  . Alcohol use: No  . Drug use: No    Home Medications Prior to Admission medications   Medication Sig Start Date End Date Taking? Authorizing Provider  clopidogrel (PLAVIX) 75 MG tablet Take 1 tablet (75 mg total) by  mouth daily. Patient taking differently: Take 75 mg by mouth at bedtime.  11/05/17   Dennie Bible, NP  Continuous Blood Gluc Receiver ([place]) [place]  12/14/19   [provider]  Continuous Blood Gluc Sensor (DEXCOM G6 SENSOR) MISC USE AS DIRECTED TO CHECK BLOOD SUGAR 02/29/20   [provider]  Continuous Blood Gluc Transmit (DEXCOM G6 TRANSMITTER) MISC AS DIRECTED AS DIRECTED CHANGE EVERY 90 DAYS 03/12/20   [provider]  dorzolamide (TRUSOPT) 2 % ophthalmic solution Place 1 drop into both eyes 2 (two) times a day. 03/30/19   [provider]  FREESTYLE LITE test strip  06/25/17   [provider]  gabapentin (NEURONTIN) 300 MG capsule Take 300 mg by mouth at bedtime.     [provider]  ibuprofen (ADVIL,MOTRIN) 800 MG tablet Take 800 mg by mouth every 8 (eight) hours as needed.    [provider]  lansoprazole (PREVACID) 30 MG capsule Take 30 mg by mouth 2 (two) times daily.  09/16/18   [provider]  lisinopril (ZESTRIL) 2.5 MG tablet Take 2.5 mg by mouth daily. 02/21/20   [provider]  loratadine-pseudoephedrine (CLARITIN-D 12-HOUR) 5-120 MG tablet Take 1 tablet by mouth at bedtime as needed for allergies. Alavert-D    [provider]  LUMIGAN 0.01 % SOLN Place 1 drop into both eyes at bedtime. 09/01/18   [provider]  Melatonin 10 MG TABS Take 10 mg by mouth at bedtime.    [provider]  metFORMIN (GLUCOPHAGE-XR) 500 MG 24 hr tablet Take 2,000 mg by mouth daily with breakfast.     [provider]  ondansetron (ZOFRAN ODT) 4 MG disintegrating tablet Take 1 tablet (4 mg total) by mouth every 8 (eight) hours as needed for nausea or vomiting. 09/01/20   Ajanee Buren C, PA-C  PENTIPS 31G X 5 MM MISC USE TO ADMINISTER VICTOZA ONCE DAILY 03/07/20   [provider]  Probiotic Product (RA PROBIOTIC GUMMIES PO) Take 1 capsule by mouth at bedtime.    [provider]  sertraline (ZOLOFT) 100 MG tablet Take 100 mg by mouth daily.  06/25/17   [provider]  simvastatin (ZOCOR) 20 MG tablet Take 20 mg by mouth every evening.    [provider]  sodium bicarbonate 650 MG tablet Take 1 tablet (650 mg total) by mouth 2 (two) times daily. 05/30/15   Inda Castle, MD  Travoprost, BAK Free, (TRAVATAN) 0.004 % SOLN ophthalmic solution Place 1 drop into both eyes nightly.    [provider]  TRESIBA FLEXTOUCH 200 UNIT/ML FlexTouch Pen SMARTSIG:50 Unit(s) SUB-Q Daily 02/23/20   [provider]  TRULICITY 1.5 PZ/0.2HE SOPN USE AS DIRECTED ONCE WEEKLY 03/02/20  [provider]    Allergies    Aspirin and Shrimp [shellfish allergy]  Review of Systems   Review of Systems  Constitutional: Positive for chills. Negative for fever.  Respiratory: Positive for cough and shortness of breath.   Cardiovascular: Negative for chest pain and leg swelling.  Gastrointestinal: Positive for nausea and vomiting. Negative for abdominal pain, blood in stool and diarrhea.  Neurological: Positive for weakness (generalized). Negative for dizziness and syncope.  All other systems reviewed and are negative.   Physical Exam Updated Vital Signs BP 135/82 (BP Location: Left Arm)   Pulse (!) 110   Temp 98.1 F (36.7 C) (Oral)   Resp 20   Ht 5\' 8"  (1.727 m)   Wt 83.9 kg   SpO2 94%   BMI 28.13 kg/m   Physical Exam Vitals and nursing note reviewed.  Constitutional:      General: He is not in acute distress.    Appearance: He is well-developed. He is not diaphoretic.  HENT:     Head: Normocephalic and atraumatic.     Mouth/Throat:     Mouth: Mucous membranes are moist.     Pharynx: Oropharynx is clear.  Eyes:     Conjunctiva/sclera: Conjunctivae normal.  Cardiovascular:     Rate and Rhythm: Regular rhythm. Tachycardia present.     Pulses: Normal pulses.          Radial pulses are 2+ on the right side and 2+ on the left  side.       Posterior tibial pulses are 2+ on the right side and 2+ on the left side.     Heart sounds: Normal heart sounds.     Comments: Tactile temperature in the extremities appropriate and equal bilaterally. Mild tachycardia. Pulmonary:     Effort: Pulmonary effort is normal. No respiratory distress.     Breath sounds: Normal breath sounds.     Comments: No increased work of breathing.  Speaks in full sentences without difficulty. Abdominal:     Palpations: Abdomen is soft.     Tenderness: There is no abdominal tenderness. There is no guarding.  Musculoskeletal:     Cervical back: Neck supple.     Right lower leg: No edema.     Left lower leg: No edema.  Lymphadenopathy:     Cervical: No cervical adenopathy.  Skin:    General: Skin is warm and dry.  Neurological:     Mental Status: He is alert.  Psychiatric:        Mood and Affect: Mood and affect normal.        Speech: Speech normal.        Behavior: Behavior normal.     ED Results / Procedures / Treatments   Labs (all labs ordered are listed, but only abnormal results are displayed) Labs Reviewed  COMPREHENSIVE METABOLIC PANEL - Abnormal; Notable for the following components:      Result Value   CO2 18 (*)    Albumin 3.4 (*)    All other components within normal limits  CBC WITH DIFFERENTIAL/PLATELET - Abnormal; Notable for the following components:   Lymphs Abs 0.6 (*)    All other components within normal limits  URINALYSIS, ROUTINE W REFLEX MICROSCOPIC - Abnormal; Notable for the following components:   Hgb urine dipstick TRACE (*)    Bilirubin Urine SMALL (*)    Ketones, ur 15 (*)    Protein, ur 30 (*)    All other components within normal limits  URINALYSIS, MICROSCOPIC (REFLEX) - Abnormal; Notable for the following components:   Bacteria, UA FEW (*)    All other components within normal limits    EKG None  Radiology DG Chest Portable 1 View  Result Date: 09/01/2020 CLINICAL DATA:  Shortness of  breath. COVID 19 infection post antibody infusion 3 days ago. EXAM: PORTABLE CHEST 1 VIEW COMPARISON:  None. FINDINGS: 1501 hours. The heart size is at the upper limits of normal for portable technique. The mediastinal contours are normal. Faint opacity at the right lung base most likely represent atelectasis. No confluent airspace opacity, edema or nodular ground-glass opacities to strongly suggest viral infection. There is no pleural effusion or pneumothorax. The bones appear unremarkable. Telemetry leads overlie the chest. IMPRESSION: Probable mild right basilar atelectasis. No focal airspace disease or definite radiographic evidence of viral infection. Electronically Signed   By: Richardean Sale M.D.   On: 09/01/2020 15:38    Procedures Procedures (including critical care time)  Medications Ordered in ED Medications  lactated ringers bolus 1,000 mL (0 mLs Intravenous Stopped 09/01/20 1636)  ondansetron (ZOFRAN) injection 4 mg (4 mg Intravenous Given 09/01/20 1513)    ED Course  I have reviewed the triage vital signs and the nursing notes.  Pertinent labs & imaging results that were available during my care of the patient were reviewed by me and considered in my medical decision making (see chart for details).  Clinical Course as of Sep 01 1848  Sat Sep 01, 2020  1650 Patient voices improvement in how he feels overall.   [SJ]    Clinical Course User Index [SJ] Kirin Pastorino, Helane Gunther, PA-C   MDM Rules/Calculators/A&P                          Patient presents primarily complaining of generalized weakness, fatigue, nausea, and vomiting in the setting of COVID-19 infection.  States he had a positive test for COVID-19 through [employer]. Patient is nontoxic appearing, afebrile, not tachypneic, not hypotensive, maintains excellent SPO2 on room air, and is in no apparent distress.  Initially mildly tachycardic, but this largely resolved prior to discharge. I have reviewed the patient's chart to obtain  more information.   I reviewed and interpreted the patient's labs and radiological studies. Evidence of dehydration with decreased CO2 as well as ketonuria without glucose in the urine.  By the end of his ED course, he stated he felt better.  Able to tolerate PO.  The patient was given instructions for home care as well as return precautions. Patient voices understanding of these instructions, accepts the plan, and is comfortable with discharge.    Findings and plan of care discussed with Theotis Burrow, MD. Dr. Rex Kras personally evaluated and examined this patient.   Vitals:   09/01/20 1600 09/01/20 1630 09/01/20 1700 09/01/20 1755  BP: 132/76 (!) 151/81 (!) 138/97   Pulse: 99 100  100  Resp: 18 16 17    Temp:      TempSrc:      SpO2: 95% 96%  95%  Weight:      Height:         Final Clinical Impression(s) / ED Diagnoses Final diagnoses:  Dehydration  Generalized weakness  COVID-19    Rx / DC Orders ED Discharge Orders         Ordered    ondansetron (ZOFRAN ODT) 4 MG disintegrating tablet  Every 8 hours PRN        09/01/20  3254           Layla Maw 09/01/20 1854    Little, Wenda Overland, MD 09/02/20 [phone]

## 2020-09-01 NOTE — ED Notes (Signed)
ED Provider at bedside. 

## 2020-09-07 ENCOUNTER — Ambulatory Visit: Payer: 59

## 2020-09-14 DIAGNOSIS — Z125 Encounter for screening for malignant neoplasm of prostate: Secondary | ICD-10-CM | POA: Diagnosis not present

## 2020-09-14 DIAGNOSIS — E785 Hyperlipidemia, unspecified: Secondary | ICD-10-CM | POA: Diagnosis not present

## 2020-09-14 DIAGNOSIS — I6930 Unspecified sequelae of cerebral infarction: Secondary | ICD-10-CM | POA: Diagnosis not present

## 2020-09-14 DIAGNOSIS — K219 Gastro-esophageal reflux disease without esophagitis: Secondary | ICD-10-CM | POA: Diagnosis not present

## 2020-09-14 DIAGNOSIS — N1831 Chronic kidney disease, stage 3a: Secondary | ICD-10-CM | POA: Diagnosis not present

## 2020-09-14 DIAGNOSIS — D509 Iron deficiency anemia, unspecified: Secondary | ICD-10-CM | POA: Diagnosis not present

## 2020-09-14 DIAGNOSIS — Z0000 Encounter for general adult medical examination without abnormal findings: Secondary | ICD-10-CM | POA: Diagnosis not present

## 2020-09-14 DIAGNOSIS — Z23 Encounter for immunization: Secondary | ICD-10-CM | POA: Diagnosis not present

## 2020-09-14 DIAGNOSIS — F39 Unspecified mood [affective] disorder: Secondary | ICD-10-CM | POA: Diagnosis not present

## 2020-09-14 DIAGNOSIS — E1142 Type 2 diabetes mellitus with diabetic polyneuropathy: Secondary | ICD-10-CM | POA: Diagnosis not present

## 2020-09-14 DIAGNOSIS — E1121 Type 2 diabetes mellitus with diabetic nephropathy: Secondary | ICD-10-CM | POA: Diagnosis not present

## 2020-09-16 DIAGNOSIS — T8069XA Other serum reaction due to other serum, initial encounter: Secondary | ICD-10-CM | POA: Diagnosis not present

## 2020-09-16 DIAGNOSIS — L500 Allergic urticaria: Secondary | ICD-10-CM | POA: Diagnosis not present

## 2020-10-03 ENCOUNTER — Other Ambulatory Visit (HOSPITAL_BASED_OUTPATIENT_CLINIC_OR_DEPARTMENT_OTHER): Payer: Self-pay | Admitting: Family Medicine

## 2020-10-08 ENCOUNTER — Other Ambulatory Visit: Payer: Self-pay

## 2020-10-08 NOTE — Patient Outreach (Signed)
  [facility]) Care Management Chronic Special Needs Program    10/08/2020  Name: TIMOTHY TRUDELL, DOB: 1956-09-10  MRN: [id number]   Mr. Chayne Baumgart is enrolled in a chronic special needs plan for Diabetes  [facility] will continue to provide services for this member through 11/23/20.  The [facility] care management team will assume care 11/24/2020.   Quinn Plowman RN,BSN,CCM Chronic Care Management Coordinator [facility] [phone]  .

## 2020-11-14 DIAGNOSIS — H401132 Primary open-angle glaucoma, bilateral, moderate stage: Secondary | ICD-10-CM | POA: Diagnosis not present

## 2020-11-14 DIAGNOSIS — H35341 Macular cyst, hole, or pseudohole, right eye: Secondary | ICD-10-CM | POA: Diagnosis not present

## 2020-11-14 DIAGNOSIS — E113293 Type 2 diabetes mellitus with mild nonproliferative diabetic retinopathy without macular edema, bilateral: Secondary | ICD-10-CM | POA: Diagnosis not present

## 2020-11-14 DIAGNOSIS — H40053 Ocular hypertension, bilateral: Secondary | ICD-10-CM | POA: Diagnosis not present

## 2020-11-14 DIAGNOSIS — H524 Presbyopia: Secondary | ICD-10-CM | POA: Diagnosis not present

## 2020-11-27 ENCOUNTER — Encounter: Payer: Self-pay | Admitting: Podiatry

## 2020-11-27 ENCOUNTER — Other Ambulatory Visit: Payer: Self-pay

## 2020-11-27 ENCOUNTER — Ambulatory Visit (INDEPENDENT_AMBULATORY_CARE_PROVIDER_SITE_OTHER): Payer: HMO | Admitting: Podiatry

## 2020-11-27 DIAGNOSIS — M79675 Pain in left toe(s): Secondary | ICD-10-CM | POA: Diagnosis not present

## 2020-11-27 DIAGNOSIS — D689 Coagulation defect, unspecified: Secondary | ICD-10-CM | POA: Diagnosis not present

## 2020-11-27 DIAGNOSIS — M79674 Pain in right toe(s): Secondary | ICD-10-CM | POA: Diagnosis not present

## 2020-11-27 DIAGNOSIS — E1142 Type 2 diabetes mellitus with diabetic polyneuropathy: Secondary | ICD-10-CM

## 2020-11-27 DIAGNOSIS — B351 Tinea unguium: Secondary | ICD-10-CM | POA: Diagnosis not present

## 2020-11-27 NOTE — Progress Notes (Signed)
This patient returns to my office for at risk foot care.  This patient requires this care by a professional since this patient will be at risk due to having diabetic neuropathy anc coagulation defect.  This patient is unable to cut nails himself since the patient cannot reach his nails.These nails are painful walking and wearing shoes.  This patient presents for at risk foot care today.  General Appearance  Alert, conversant and in no acute stress.  Vascular  Dorsalis pedis and posterior tibial  pulses are palpable  bilaterally.  Capillary return is within normal limits  bilaterally. Temperature is within normal limits  bilaterally.  Neurologic  Senn-Weinstein monofilament wire test absent  bilaterally. Muscle power within normal limits bilaterally.  Nails Thick disfigured discolored nails with subungual debris  Hallux nails  B/L. No evidence of bacterial infection or drainage bilaterally.  Orthopedic  No limitations of motion  feet .  No crepitus or effusions noted.  Hammer toes 2-4  B/L. Hallux malleus  B/L  Skin  normotropic skin with no porokeratosis noted bilaterally.  No signs of infections or ulcers noted.     Onychomycosis  Pain in right toes  Pain in left toes  Consent was obtained for treatment procedures.   Mechanical debridement of hallux nails  bilaterally performed with a nail nipper.  Filed with dremel without incident.    Return office visit    3 months                  Told patient to return for periodic foot care and evaluation due to potential at risk complications.   Gardiner Barefoot DPM

## 2020-12-18 ENCOUNTER — Other Ambulatory Visit (HOSPITAL_BASED_OUTPATIENT_CLINIC_OR_DEPARTMENT_OTHER): Payer: Self-pay | Admitting: Family Medicine

## 2020-12-19 ENCOUNTER — Other Ambulatory Visit (HOSPITAL_BASED_OUTPATIENT_CLINIC_OR_DEPARTMENT_OTHER): Payer: Self-pay | Admitting: Family Medicine

## 2020-12-26 ENCOUNTER — Other Ambulatory Visit (HOSPITAL_BASED_OUTPATIENT_CLINIC_OR_DEPARTMENT_OTHER): Payer: Self-pay | Admitting: Family Medicine

## 2021-01-02 ENCOUNTER — Telehealth: Payer: Self-pay

## 2021-01-02 NOTE — Telephone Encounter (Signed)
Called pt per inbasket message, appt made and he will come 2.10.22    Adam Quinn

## 2021-01-03 ENCOUNTER — Inpatient Hospital Stay (HOSPITAL_BASED_OUTPATIENT_CLINIC_OR_DEPARTMENT_OTHER): Payer: HMO | Admitting: Family

## 2021-01-03 ENCOUNTER — Other Ambulatory Visit: Payer: Self-pay

## 2021-01-03 ENCOUNTER — Telehealth: Payer: Self-pay | Admitting: Family

## 2021-01-03 ENCOUNTER — Encounter: Payer: Self-pay | Admitting: Family

## 2021-01-03 ENCOUNTER — Inpatient Hospital Stay: Payer: HMO | Attending: Hematology & Oncology

## 2021-01-03 ENCOUNTER — Other Ambulatory Visit: Payer: Self-pay | Admitting: Family

## 2021-01-03 VITALS — BP 133/76 | HR 102 | Temp 98.3°F | Resp 20 | Wt 173.0 lb

## 2021-01-03 DIAGNOSIS — K909 Intestinal malabsorption, unspecified: Secondary | ICD-10-CM | POA: Diagnosis not present

## 2021-01-03 DIAGNOSIS — D508 Other iron deficiency anemias: Secondary | ICD-10-CM

## 2021-01-03 DIAGNOSIS — D500 Iron deficiency anemia secondary to blood loss (chronic): Secondary | ICD-10-CM

## 2021-01-03 DIAGNOSIS — E611 Iron deficiency: Secondary | ICD-10-CM | POA: Diagnosis not present

## 2021-01-03 DIAGNOSIS — Z8616 Personal history of COVID-19: Secondary | ICD-10-CM | POA: Insufficient documentation

## 2021-01-03 LAB — CBC WITH DIFFERENTIAL (CANCER CENTER ONLY)
Abs Immature Granulocytes: 0.03 10*3/uL (ref 0.00–0.07)
Basophils Absolute: 0 10*3/uL (ref 0.0–0.1)
Basophils Relative: 1 %
Eosinophils Absolute: 0.4 10*3/uL (ref 0.0–0.5)
Eosinophils Relative: 6 %
HCT: 43.1 % (ref 39.0–52.0)
Hemoglobin: 14.5 g/dL (ref 13.0–17.0)
Immature Granulocytes: 0 %
Lymphocytes Relative: 15 %
Lymphs Abs: 1.1 10*3/uL (ref 0.7–4.0)
MCH: 31 pg (ref 26.0–34.0)
MCHC: 33.6 g/dL (ref 30.0–36.0)
MCV: 92.3 fL (ref 80.0–100.0)
Monocytes Absolute: 0.5 10*3/uL (ref 0.1–1.0)
Monocytes Relative: 8 %
Neutro Abs: 5 10*3/uL (ref 1.7–7.7)
Neutrophils Relative %: 70 %
Platelet Count: 229 10*3/uL (ref 150–400)
RBC: 4.67 MIL/uL (ref 4.22–5.81)
RDW: 13.2 % (ref 11.5–15.5)
WBC Count: 7.1 10*3/uL (ref 4.0–10.5)
nRBC: 0 % (ref 0.0–0.2)

## 2021-01-03 LAB — CMP (CANCER CENTER ONLY)
ALT: 11 U/L (ref 0–44)
AST: 11 U/L — ABNORMAL LOW (ref 15–41)
Albumin: 4.3 g/dL (ref 3.5–5.0)
Alkaline Phosphatase: 81 U/L (ref 38–126)
Anion gap: 8 (ref 5–15)
BUN: 12 mg/dL (ref 8–23)
CO2: 29 mmol/L (ref 22–32)
Calcium: 10.1 mg/dL (ref 8.9–10.3)
Chloride: 105 mmol/L (ref 98–111)
Creatinine: 1.2 mg/dL (ref 0.61–1.24)
GFR, Estimated: >60 mL/min (ref >60)
Glucose, Bld: 129 mg/dL — ABNORMAL HIGH (ref 70–99)
Potassium: 4.9 mmol/L (ref 3.5–5.1)
Sodium: 142 mmol/L (ref 135–145)
Total Bilirubin: 0.3 mg/dL (ref 0.3–1.2)
Total Protein: 6.4 g/dL — ABNORMAL LOW (ref 6.5–8.1)

## 2021-01-03 NOTE — Telephone Encounter (Signed)
Per 2/10 los Follows up as needed.

## 2021-01-03 NOTE — Progress Notes (Signed)
Hematology and Oncology Follow Up Visit  Adam Quinn [id number] 1956-07-21 65 y.o. 01/03/2021   Principle Diagnosis:  Iron deficiency anemia Erythropoietin deficiency anemia  Current Therapy:        IV iron as indicated   Interim History:  Adam Quinn is here today for follow-up. Adam Quinn is doing fairly well. Both Adam Quinn and Adam Quinn wife are finally getting over their bout with Covid in October 2021.  Adam Quinn energy is improved but Adam Quinn still has fatigue at times particularly around lunch. Adam Quinn is unsure if this may be due to Adam Quinn blood glucose level. Adam Quinn has the [place] continuous glucose monitor but can not here it beep when Adam Quinn is hypoglycemic.  Adam Quinn appetite is improving. Adam Quinn is eating well and doing Adam Quinn best to stay well hydrated.  No fever, chills, n/v, cough, rash, dizziness, SOB, chest pain, palpitations, abdominal pain or changes in bowel or bladder habits.  No blood loss noted. No bruising or petechiae.  No swelling, tenderness, numbness or tingling in Adam Quinn extremities.  No falls or syncope.   ECOG Performance Status: 1 - Symptomatic but completely ambulatory  Medications:  Allergies as of 01/03/2021      Reactions   Aspirin Hives   Shrimp [shellfish Allergy] Hives   Just shrimp      Medication List       Accurate as of January 03, 2021  1:48 PM. If you have any questions, ask your nurse or doctor.        clopidogrel 75 MG tablet Commonly known as: PLAVIX Take 1 tablet (75 mg total) by mouth daily. What changed: when to take this   Dexcom G6 Receiver Devi   Dexcom G6 Sensor Misc USE AS DIRECTED TO CHECK BLOOD SUGAR   Dexcom G6 Transmitter Misc AS DIRECTED AS DIRECTED CHANGE EVERY 90 DAYS   dorzolamide 2 % ophthalmic solution Commonly known as: TRUSOPT Place 1 drop into both eyes 2 (two) times a day.   FREESTYLE LITE test strip Generic drug: glucose blood   gabapentin 300 MG capsule Commonly known as: NEURONTIN Take 300 mg by mouth at bedtime.   ibuprofen 800 MG  tablet Commonly known as: ADVIL Take 800 mg by mouth every 8 (eight) hours as needed.   lansoprazole 30 MG capsule Commonly known as: PREVACID Take 30 mg by mouth 2 (two) times daily.   lisinopril 2.5 MG tablet Commonly known as: ZESTRIL Take 2.5 mg by mouth daily.   loratadine-pseudoephedrine 5-120 MG tablet Commonly known as: CLARITIN-D 12-hour Take 1 tablet by mouth at bedtime as needed for allergies. Alavert-D   Lumigan 0.01 % Soln Generic drug: bimatoprost Place 1 drop into both eyes at bedtime.   Melatonin 10 MG Tabs Take 10 mg by mouth at bedtime.   metFORMIN 500 MG 24 hr tablet Commonly known as: GLUCOPHAGE-XR Take 2,000 mg by mouth daily with breakfast.   ondansetron 4 MG disintegrating tablet Commonly known as: Zofran ODT Take 1 tablet (4 mg total) by mouth every 8 (eight) hours as needed for nausea or vomiting.   PenTips 31G X 5 MM Misc Generic drug: Insulin Pen Needle USE TO ADMINISTER VICTOZA ONCE DAILY   RA PROBIOTIC GUMMIES PO Take 1 capsule by mouth at bedtime.   sertraline 100 MG tablet Commonly known as: ZOLOFT Take 100 mg by mouth daily.   simvastatin 20 MG tablet Commonly known as: ZOCOR Take 20 mg by mouth every evening.   sodium bicarbonate 650 MG tablet Take 1 tablet (650 mg total)  by mouth 2 (two) times daily.   Travoprost (BAK Free) 0.004 % Soln ophthalmic solution Commonly known as: TRAVATAN Place 1 drop into both eyes nightly.   Tyler Aas FlexTouch 200 UNIT/ML FlexTouch Pen Generic drug: insulin degludec SMARTSIG:50 Unit(s) SUB-Q Daily   Trulicity 1.5 BP/1.0CH Sopn Generic drug: Dulaglutide USE AS DIRECTED ONCE WEEKLY       Allergies:  Allergies  Allergen Reactions   Aspirin Hives   Shrimp [Shellfish Allergy] Hives    Just shrimp     Past Medical History, Surgical history, Social history, and Family History were reviewed and updated.  Review of Systems: All other 10 point review of systems is negative.   Physical  Exam:  vitals were not taken for this visit.   Wt Readings from Last 3 Encounters:  09/01/20 185 lb (83.9 kg)  05/08/20 185 lb (83.9 kg)  04/24/20 185 lb (83.9 kg)    Ocular: Sclerae unicteric, pupils equal, round and reactive to light Ear-nose-throat: Oropharynx clear, dentition fair Lymphatic: No cervical or supraclavicular adenopathy Lungs no rales or rhonchi, good excursion bilaterally Heart regular rate and rhythm, no murmur appreciated Abd soft, nontender, positive bowel sounds MSK no focal spinal tenderness, no joint edema Neuro: non-focal, well-oriented, appropriate affect Breasts: Deferred   Lab Results  Component Value Date   WBC 6.9 09/01/2020   HGB 14.9 09/01/2020   HCT 44.4 09/01/2020   MCV 91.0 09/01/2020   PLT 219 09/01/2020   Lab Results  Component Value Date   FERRITIN 112 04/24/2020   IRON 97 04/24/2020   TIBC 371 04/24/2020   UIBC 274 04/24/2020   IRONPCTSAT 26 04/24/2020   Lab Results  Component Value Date   RETICCTPCT 1.3 04/05/2018   RBC 4.88 09/01/2020   No results found for: KPAFRELGTCHN, LAMBDASER, KAPLAMBRATIO No results found for: IGGSERUM, IGA, IGMSERUM No results found for: Odetta Pink, SPEI   Chemistry      Component Value Date/Time   NA 137 09/01/2020 1520   NA 140 05/26/2017 0856   K 4.6 09/01/2020 1520   K 4.2 05/26/2017 0856   CL 105 09/01/2020 1520   CO2 18 (L) 09/01/2020 1520   CO2 25 05/26/2017 0856   BUN 23 09/01/2020 1520   BUN 12.7 05/26/2017 0856   CREATININE 1.07 09/01/2020 1520   CREATININE 1.19 04/24/2020 1003   CREATININE 1.2 05/26/2017 0856      Component Value Date/Time   CALCIUM 9.0 09/01/2020 1520   CALCIUM 9.5 05/26/2017 0856   ALKPHOS 66 09/01/2020 1520   ALKPHOS 83 05/26/2017 0856   AST 20 09/01/2020 1520   AST 11 (L) 04/24/2020 1003   AST 16 05/26/2017 0856   ALT 20 09/01/2020 1520   ALT 14 04/24/2020 1003   ALT 22 05/26/2017 0856   BILITOT  0.9 09/01/2020 1520   BILITOT 0.4 04/24/2020 1003   BILITOT 0.28 05/26/2017 0856       Impression and Plan: Adam Quinn is a very pleasant 65 yo caucasian gentleman with 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 Iron studies are pending. We will replace if needed.  We will continue to follow-up as needed.  Adam Quinn will contact our office with any questions or concerns.   Laverna Peace, NP 2/10/20221:48 PM

## 2021-01-04 LAB — FERRITIN: Ferritin: 115 ng/mL (ref 24–336)

## 2021-01-04 LAB — IRON AND TIBC
Iron: 69 ug/dL (ref 42–163)
Saturation Ratios: 22 % (ref 20–55)
TIBC: 315 ug/dL (ref 202–409)
UIBC: 246 ug/dL (ref 117–376)

## 2021-01-10 ENCOUNTER — Other Ambulatory Visit (HOSPITAL_BASED_OUTPATIENT_CLINIC_OR_DEPARTMENT_OTHER): Payer: Self-pay | Admitting: Ophthalmology

## 2021-02-05 ENCOUNTER — Encounter (INDEPENDENT_AMBULATORY_CARE_PROVIDER_SITE_OTHER): Payer: HMO | Admitting: Ophthalmology

## 2021-02-22 ENCOUNTER — Other Ambulatory Visit (HOSPITAL_BASED_OUTPATIENT_CLINIC_OR_DEPARTMENT_OTHER): Payer: Self-pay | Admitting: Family Medicine

## 2021-02-26 ENCOUNTER — Other Ambulatory Visit (HOSPITAL_BASED_OUTPATIENT_CLINIC_OR_DEPARTMENT_OTHER): Payer: Self-pay

## 2021-02-26 MED FILL — Continuous Glucose System Sensor: 84 days supply | Qty: 6 | Fill #0 | Status: AC

## 2021-02-26 MED FILL — Dorzolamide HCl Ophth Soln 2%: OPHTHALMIC | 50 days supply | Qty: 10 | Fill #0 | Status: AC

## 2021-02-26 MED FILL — Dulaglutide Soln Auto-injector 1.5 MG/0.5ML: SUBCUTANEOUS | 28 days supply | Qty: 2 | Fill #0 | Status: AC

## 2021-02-27 ENCOUNTER — Other Ambulatory Visit (HOSPITAL_BASED_OUTPATIENT_CLINIC_OR_DEPARTMENT_OTHER): Payer: Self-pay

## 2021-03-15 DIAGNOSIS — E785 Hyperlipidemia, unspecified: Secondary | ICD-10-CM | POA: Diagnosis not present

## 2021-03-15 DIAGNOSIS — E1121 Type 2 diabetes mellitus with diabetic nephropathy: Secondary | ICD-10-CM | POA: Diagnosis not present

## 2021-03-15 DIAGNOSIS — E11319 Type 2 diabetes mellitus with unspecified diabetic retinopathy without macular edema: Secondary | ICD-10-CM | POA: Diagnosis not present

## 2021-03-15 DIAGNOSIS — E1142 Type 2 diabetes mellitus with diabetic polyneuropathy: Secondary | ICD-10-CM | POA: Diagnosis not present

## 2021-03-15 DIAGNOSIS — I6930 Unspecified sequelae of cerebral infarction: Secondary | ICD-10-CM | POA: Diagnosis not present

## 2021-03-15 DIAGNOSIS — N1831 Chronic kidney disease, stage 3a: Secondary | ICD-10-CM | POA: Diagnosis not present

## 2021-03-25 ENCOUNTER — Other Ambulatory Visit (HOSPITAL_BASED_OUTPATIENT_CLINIC_OR_DEPARTMENT_OTHER): Payer: Self-pay

## 2021-03-25 MED FILL — Clopidogrel Bisulfate Tab 75 MG (Base Equiv): ORAL | 90 days supply | Qty: 90 | Fill #0 | Status: AC

## 2021-03-25 MED FILL — Sodium Bicarbonate Tab 650 MG: ORAL | 90 days supply | Qty: 180 | Fill #0 | Status: AC

## 2021-03-25 MED FILL — Bimatoprost Ophth Soln 0.01%: OPHTHALMIC | 75 days supply | Qty: 7.5 | Fill #0 | Status: AC

## 2021-04-02 ENCOUNTER — Other Ambulatory Visit (HOSPITAL_BASED_OUTPATIENT_CLINIC_OR_DEPARTMENT_OTHER): Payer: Self-pay

## 2021-04-02 MED ORDER — TRULICITY 1.5 MG/0.5ML ~~LOC~~ SOAJ
SUBCUTANEOUS | 1 refills | Status: DC
  Filled 2021-04-02: qty 2, 28d supply, fill #0
  Filled 2021-04-30: qty 2, 28d supply, fill #1
  Filled 2021-05-23: qty 2, 28d supply, fill #2
  Filled 2021-06-26: qty 2, 28d supply, fill #3
  Filled 2021-07-25: qty 2, 28d supply, fill #4
  Filled 2021-08-26: qty 2, 28d supply, fill #5

## 2021-04-05 ENCOUNTER — Other Ambulatory Visit (HOSPITAL_BASED_OUTPATIENT_CLINIC_OR_DEPARTMENT_OTHER): Payer: Self-pay

## 2021-04-08 ENCOUNTER — Other Ambulatory Visit (HOSPITAL_BASED_OUTPATIENT_CLINIC_OR_DEPARTMENT_OTHER): Payer: Self-pay

## 2021-04-08 MED FILL — Clopidogrel Bisulfate Tab 75 MG (Base Equiv): ORAL | 90 days supply | Qty: 90 | Fill #1 | Status: CN

## 2021-04-08 MED FILL — Sertraline HCl Tab 100 MG: ORAL | 90 days supply | Qty: 90 | Fill #0 | Status: AC

## 2021-04-10 ENCOUNTER — Other Ambulatory Visit (HOSPITAL_BASED_OUTPATIENT_CLINIC_OR_DEPARTMENT_OTHER): Payer: Self-pay

## 2021-04-11 ENCOUNTER — Other Ambulatory Visit (HOSPITAL_BASED_OUTPATIENT_CLINIC_OR_DEPARTMENT_OTHER): Payer: Self-pay

## 2021-04-16 ENCOUNTER — Encounter: Payer: Self-pay | Admitting: Podiatry

## 2021-04-16 ENCOUNTER — Encounter: Payer: Self-pay | Admitting: Adult Health

## 2021-04-16 ENCOUNTER — Other Ambulatory Visit (HOSPITAL_BASED_OUTPATIENT_CLINIC_OR_DEPARTMENT_OTHER): Payer: Self-pay

## 2021-04-16 MED FILL — Dorzolamide HCl Ophth Soln 2%: OPHTHALMIC | 50 days supply | Qty: 10 | Fill #1 | Status: AC

## 2021-04-18 NOTE — Telephone Encounter (Signed)
Please schedule

## 2021-04-23 ENCOUNTER — Encounter: Payer: Self-pay | Admitting: Podiatry

## 2021-04-24 NOTE — Telephone Encounter (Signed)
Please schedule for patient.

## 2021-04-29 ENCOUNTER — Encounter: Payer: Self-pay | Admitting: Family

## 2021-04-29 NOTE — Telephone Encounter (Signed)
Please schedule, thanks.

## 2021-04-30 ENCOUNTER — Other Ambulatory Visit (HOSPITAL_BASED_OUTPATIENT_CLINIC_OR_DEPARTMENT_OTHER): Payer: Self-pay

## 2021-05-01 ENCOUNTER — Encounter: Payer: Self-pay | Admitting: Podiatry

## 2021-05-01 ENCOUNTER — Ambulatory Visit (INDEPENDENT_AMBULATORY_CARE_PROVIDER_SITE_OTHER): Payer: HMO | Admitting: Podiatry

## 2021-05-01 ENCOUNTER — Other Ambulatory Visit: Payer: Self-pay

## 2021-05-01 DIAGNOSIS — B351 Tinea unguium: Secondary | ICD-10-CM

## 2021-05-01 DIAGNOSIS — M79675 Pain in left toe(s): Secondary | ICD-10-CM | POA: Diagnosis not present

## 2021-05-01 DIAGNOSIS — M79674 Pain in right toe(s): Secondary | ICD-10-CM

## 2021-05-01 DIAGNOSIS — D689 Coagulation defect, unspecified: Secondary | ICD-10-CM | POA: Diagnosis not present

## 2021-05-01 DIAGNOSIS — E1142 Type 2 diabetes mellitus with diabetic polyneuropathy: Secondary | ICD-10-CM | POA: Diagnosis not present

## 2021-05-01 NOTE — Progress Notes (Signed)
This patient returns to my office for at risk foot care.  This patient requires this care by a professional since this patient will be at risk due to having diabetic neuropathy anc coagulation defect.  This patient is unable to cut nails himself since the patient cannot reach his nails.These nails are painful walking and wearing shoes.  This patient presents for at risk foot care today.  General Appearance  Alert, conversant and in no acute stress.  Vascular  Dorsalis pedis and posterior tibial  pulses are palpable  bilaterally.  Capillary return is within normal limits  bilaterally. Temperature is within normal limits  bilaterally.  Neurologic  Senn-Weinstein monofilament wire test absent  bilaterally. Muscle power within normal limits bilaterally.  Nails Thick disfigured discolored nails with subungual debris  Hallux nails  B/L. No evidence of bacterial infection or drainage bilaterally.  Orthopedic  No limitations of motion  feet .  No crepitus or effusions noted.  Hammer toes 2-4  B/L. Hallux malleus  B/L  Skin  normotropic skin with no porokeratosis noted bilaterally.  No signs of infections or ulcers noted.     Onychomycosis  Pain in right toes  Pain in left toes  Consent was obtained for treatment procedures.   Mechanical debridement of hallux nails  bilaterally performed with a nail nipper.  Filed with dremel without incident.    Return office visit    3 months                  Told patient to return for periodic foot care and evaluation due to potential at risk complications.   Gardiner Barefoot DPM

## 2021-05-08 ENCOUNTER — Ambulatory Visit: Payer: 59 | Admitting: Adult Health

## 2021-05-13 ENCOUNTER — Encounter (INDEPENDENT_AMBULATORY_CARE_PROVIDER_SITE_OTHER): Payer: Self-pay

## 2021-05-13 DIAGNOSIS — H401132 Primary open-angle glaucoma, bilateral, moderate stage: Secondary | ICD-10-CM | POA: Diagnosis not present

## 2021-05-16 ENCOUNTER — Other Ambulatory Visit (HOSPITAL_BASED_OUTPATIENT_CLINIC_OR_DEPARTMENT_OTHER): Payer: Self-pay

## 2021-05-16 MED ORDER — LUMIGAN 0.01 % OP SOLN
1.0000 [drp] | Freq: Every day | OPHTHALMIC | 2 refills | Status: DC
  Filled 2021-05-16: qty 2.5, 25d supply, fill #0
  Filled 2021-06-19: qty 2.5, 50d supply, fill #0
  Filled 2021-07-25: qty 2.5, 50d supply, fill #1
  Filled 2021-07-30: qty 2.5, 25d supply, fill #1
  Filled 2021-09-10: qty 2.5, 25d supply, fill #2

## 2021-05-16 MED ORDER — DORZOLAMIDE HCL 2 % OP SOLN
OPHTHALMIC | 2 refills | Status: DC
  Filled 2021-05-16 – 2021-05-23 (×2): qty 10, 50d supply, fill #0
  Filled 2021-07-17: qty 10, 50d supply, fill #1
  Filled 2021-09-10: qty 10, 50d supply, fill #2

## 2021-05-20 ENCOUNTER — Other Ambulatory Visit (HOSPITAL_BASED_OUTPATIENT_CLINIC_OR_DEPARTMENT_OTHER): Payer: Self-pay

## 2021-05-20 MED ORDER — METFORMIN HCL ER 500 MG PO TB24
ORAL_TABLET | ORAL | 0 refills | Status: DC
  Filled 2021-05-20: qty 360, 90d supply, fill #0

## 2021-05-20 MED ORDER — GABAPENTIN 300 MG PO CAPS
ORAL_CAPSULE | ORAL | 0 refills | Status: DC
  Filled 2021-05-20: qty 90, 90d supply, fill #0

## 2021-05-20 MED ORDER — LANSOPRAZOLE 30 MG PO CPDR
DELAYED_RELEASE_CAPSULE | ORAL | 0 refills | Status: DC
  Filled 2021-05-20: qty 180, 90d supply, fill #0

## 2021-05-20 MED FILL — Continuous Glucose System Sensor: 84 days supply | Qty: 6 | Fill #1 | Status: AC

## 2021-05-21 ENCOUNTER — Other Ambulatory Visit (HOSPITAL_BASED_OUTPATIENT_CLINIC_OR_DEPARTMENT_OTHER): Payer: Self-pay

## 2021-05-23 ENCOUNTER — Encounter (INDEPENDENT_AMBULATORY_CARE_PROVIDER_SITE_OTHER): Payer: Self-pay | Admitting: Ophthalmology

## 2021-05-23 ENCOUNTER — Other Ambulatory Visit (HOSPITAL_BASED_OUTPATIENT_CLINIC_OR_DEPARTMENT_OTHER): Payer: Self-pay

## 2021-05-23 ENCOUNTER — Other Ambulatory Visit: Payer: Self-pay

## 2021-05-23 ENCOUNTER — Ambulatory Visit (INDEPENDENT_AMBULATORY_CARE_PROVIDER_SITE_OTHER): Payer: HMO | Admitting: Ophthalmology

## 2021-05-23 DIAGNOSIS — Z9989 Dependence on other enabling machines and devices: Secondary | ICD-10-CM | POA: Diagnosis not present

## 2021-05-23 DIAGNOSIS — E119 Type 2 diabetes mellitus without complications: Secondary | ICD-10-CM

## 2021-05-23 DIAGNOSIS — H35351 Cystoid macular degeneration, right eye: Secondary | ICD-10-CM

## 2021-05-23 DIAGNOSIS — G4733 Obstructive sleep apnea (adult) (pediatric): Secondary | ICD-10-CM | POA: Diagnosis not present

## 2021-05-23 DIAGNOSIS — H43811 Vitreous degeneration, right eye: Secondary | ICD-10-CM

## 2021-05-23 NOTE — Assessment & Plan Note (Signed)
Compliant with nasal prong type of BiPAP

## 2021-05-23 NOTE — Assessment & Plan Note (Signed)
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  Nonetheless his sleep apnea is well treated in light likely to cause progression of of maculopathy

## 2021-05-23 NOTE — Progress Notes (Signed)
05/23/2021     CHIEF COMPLAINT Patient presents for Retina Evaluation (NP mac cyst OD - Ref'd by Dr. Hecker//Pt denies any visual changes OU. Pt denies distortion or blur OU. Pt reports intermittent floaters OU. No ocular pain or flashes reported OU./A1c: 5.5, 01/2021/LBS: 182 this AM)   HISTORY OF PRESENT ILLNESS: Adam Quinn is a 65 y.o. male who presents to the clinic today for:   HPI     Retina Evaluation           Laterality: right eye   Onset: years ago   Duration: years   Associated Symptoms: Floaters   Treatments tried: no treatments   Comments: NP mac cyst OD - Ref'd by Dr. Herbert Deaner  Pt denies any visual changes OU. Pt denies distortion or blur OU. Pt reports intermittent floaters OU. No ocular pain or flashes reported OU. A1c: 5.5, 01/2021 LBS: 182 this AM         Comments   Upon the review of the OCT with central foveal cystoid change, asked the patient if he suffered from or ever been tested for sleep apnea.  He reports being on therapy for sleep apnea now for some 5 years.  He may have had symptoms of untreated sleep apnea for some 5 to 7 years previous to that.      Last edited by Hurman Horn, MD on 05/23/2021  9:37 AM.      Referring physician: Kelton Pillar, MD [place] [place],  [place] [address]  HISTORICAL INFORMATION:   Selected notes from the MEDICAL RECORD NUMBER    Lab Results  Component Value Date   HGBA1C 6.6 (H) 08/04/2019     CURRENT MEDICATIONS: Current Outpatient Medications (Ophthalmic Drugs)  Medication Sig   dorzolamide (TRUSOPT) 2 % ophthalmic solution INSTILL 1 DROP IN BOTH EYES TWICE A DAY   LUMIGAN 0.01 % SOLN INSTILL 1 DROP INTO BOTH EYES AT BEDTIME   dorzolamide (TRUSOPT) 2 % ophthalmic solution Place 1 drop into both eyes 2 (two) times a day.   LUMIGAN 0.01 % SOLN Place 1 drop into both eyes at bedtime.   Travoprost, BAK Free, (TRAVATAN) 0.004 % SOLN ophthalmic solution Place 1 drop into both eyes  nightly.   No current facility-administered medications for this visit. (Ophthalmic Drugs)   Current Outpatient Medications (Other)  Medication Sig   clopidogrel (PLAVIX) 75 MG tablet Take 1 tablet (75 mg total) by mouth daily. (Patient taking differently: Take 75 mg by mouth at bedtime.)   clopidogrel (PLAVIX) 75 MG tablet TAKE 1 TABLET BY MOUTH ONCE DAILY   Continuous Blood Gluc Receiver (FREESTYLE LIBRE 2 READER) DEVI USE AS DIRECTED FOR CONTINUOUS BLOOD SUGAR MONITORING   Continuous Blood Gluc Sensor (DEXCOM G6 SENSOR) MISC USE AS DIRECTED EVERY 10 DAYS   Continuous Blood Gluc Sensor (FREESTYLE LIBRE 2 SENSOR) MISC APPLY SENSOR AS DIRECTED EXTERNALLY TO BODY AS DIRECTED EVERY 2 WEEKS TO READ BLOOD SUGAR CONTINUOUSLY   Continuous Blood Gluc Sensor (FREESTYLE LIBRE SENSOR SYSTEM) MISC    Continuous Blood Gluc Transmit (DEXCOM G6 TRANSMITTER) MISC AS DIRECTED AS DIRECTED CHANGE EVERY 90 DAYS   Dulaglutide (TRULICITY) 1.5 NV/9.67YO SOPN Inject under the skin once a week   FREESTYLE LITE test strip    gabapentin (NEURONTIN) 300 MG capsule Take 300 mg by mouth at bedtime.    gabapentin (NEURONTIN) 300 MG capsule TAKE 1 CAPSULE BY MOUTH ONCE DAILY   gabapentin (NEURONTIN) 300 MG capsule TAKE 1  CAPSULE BY MOUTH ONCE DAILY   gabapentin (NEURONTIN) 300 MG capsule TAKE 1 CAPSULE BY MOUTH ONCE DAILY   glimepiride (AMARYL) 4 MG tablet Take by mouth.   ibuprofen (ADVIL,MOTRIN) 800 MG tablet Take 800 mg by mouth every 8 (eight) hours as needed.   Insulin Pen Needle 31G X 5 MM MISC USE AS DIRECTED TO ADMINISTER VICTOZA ONCE DAILY   lansoprazole (PREVACID) 30 MG capsule Take 30 mg by mouth 2 (two) times daily.    lansoprazole (PREVACID) 30 MG capsule TAKE 1 CAPSULE BY MOUTH TWICE DAILY   lansoprazole (PREVACID) 30 MG capsule TAKE 1 CAPSULE BY MOUTH TWICE DAILY   lansoprazole (PREVACID) 30 MG capsule TAKE 1 CAPSULE BY MOUTH TWICE DAILY   lisinopril (ZESTRIL) 2.5 MG tablet Take 2.5 mg by mouth daily.    lisinopril (ZESTRIL) 2.5 MG tablet TAKE 1 TABLET BY MOUTH ONCE DAILY   loratadine-pseudoephedrine (CLARITIN-D 12-HOUR) 5-120 MG tablet Take 1 tablet by mouth at bedtime as needed for allergies. Alavert-D   Melatonin 10 MG TABS Take 10 mg by mouth at bedtime.   metFORMIN (GLUCOPHAGE-XR) 500 MG 24 hr tablet Take 2,000 mg by mouth daily with breakfast.    metFORMIN (GLUCOPHAGE-XR) 500 MG 24 hr tablet TAKE 4 TABLETS BY MOUTH EVERY NIGHT AT BEDTIME   metFORMIN (GLUCOPHAGE-XR) 500 MG 24 hr tablet TAKE 4 TABLETS BY MOUTH AT BEDTIME ONCE A DAY   metFORMIN (GLUCOPHAGE-XR) 500 MG 24 hr tablet Take 2 tablets by mouth twice a day   ondansetron (ZOFRAN ODT) 4 MG disintegrating tablet Take 1 tablet (4 mg total) by mouth every 8 (eight) hours as needed for nausea or vomiting. (Patient not taking: Reported on 01/03/2021)   PENTIPS 31G X 5 MM MISC USE TO ADMINISTER VICTOZA ONCE DAILY (Patient not taking: Reported on 01/03/2021)   Probiotic Product (RA PROBIOTIC GUMMIES PO) Take 1 capsule by mouth at bedtime.   sertraline (ZOLOFT) 100 MG tablet Take 100 mg by mouth daily.    sertraline (ZOLOFT) 100 MG tablet TAKE 1 TABLET BY MOUTH ONCE DAILY   simvastatin (ZOCOR) 20 MG tablet Take 20 mg by mouth every evening.   simvastatin (ZOCOR) 20 MG tablet TAKE 1 TABLET BY MOUTH ONCE DAILY   sodium bicarbonate 650 MG tablet Take 1 tablet (650 mg total) by mouth 2 (two) times daily.   sodium bicarbonate 650 MG tablet TAKE 1 TABLET BY MOUTH TWICE DAILY   TRESIBA FLEXTOUCH 200 UNIT/ML FlexTouch Pen Based on Sliding Scale patient has devised.   TRULICITY 1.5 VH/8.4ON SOPN USE AS DIRECTED ONCE WEEKLY   No current facility-administered medications for this visit. (Other)      REVIEW OF SYSTEMS:    ALLERGIES Allergies  Allergen Reactions   Aspirin Hives   Shrimp [Shellfish Allergy] Hives    Just shrimp     PAST MEDICAL HISTORY Past Medical History:  Diagnosis Date   Allergy    seasonal   Anxiety    Cataract     cataract surgery bilaterally resolved issues   Central scotoma    Chronic kidney disease    small protien showing uses Lisinopril for this   Diverticulitis    Diverticulosis    DM (diabetes mellitus) (HCC)    GERD (gastroesophageal reflux disease)    Glaucoma    History of colon polyps    History of hiatal hernia    History of kidney stones    x1   Iron deficiency anemia    Iron infusion 04/29/2019  Neuropathy    OSA on CPAP    Stroke [facility])    Wears glasses    READING   Past Surgical History:  Procedure Laterality Date   cataract surgery     bilateral   COLONOSCOPY     COLONOSCOPY N/A 06/04/2015   Procedure: COLONOSCOPY;  Surgeon: Inda Castle, MD;  Location: [facility] ENDOSCOPY;  Service: Endoscopy;  Laterality: N/A;   COLONOSCOPY WITH PROPOFOL N/A 05/09/2019   Procedure: COLONOSCOPY WITH PROPOFOL;  Surgeon: Doran Stabler, MD;  Location: [facility] ENDOSCOPY;  Service: Gastroenterology;  Laterality: N/A;   ESOPHAGOGASTRODUODENOSCOPY N/A 06/04/2015   Procedure: ESOPHAGOGASTRODUODENOSCOPY (EGD);  Surgeon: Inda Castle, MD;  Location: [facility];  Service: Endoscopy;  Laterality: N/A;   UPPER GASTROINTESTINAL ENDOSCOPY     WISDOM TOOTH EXTRACTION     with sedation    FAMILY HISTORY Family History  Problem Relation Age of Onset   Colon polyps Father    Lung cancer Father    Diabetes Mother    Dementia Mother    Diabetes Sister        x 2   Stroke Maternal Grandmother    Stroke Paternal Grandfather    Colon cancer Neg Hx    Rectal cancer Neg Hx    Stomach cancer Neg Hx    Pancreatic cancer Neg Hx     SOCIAL HISTORY Social History   Tobacco Use   Smoking status: Former    Pack years: 0.00    Types: Cigarettes    Quit date: 10/04/1997    Years since quitting: 23.6   Smokeless tobacco: Never  Vaping Use   Vaping Use: Never used  Substance Use Topics   Alcohol use: No   Drug use: No         OPHTHALMIC EXAM:  Base Eye Exam     Visual Acuity (ETDRS)        Right Left   Dist [place] 20/60 +1 20/70   Dist ph [place] 20/40 -1 20/50 +1         Tonometry (Tonopen, 8:49 AM)       Right Left   Pressure 12 19         Pupils       Dark Light Shape React APD   Right 5 5 Round Minimal None   Left 4 4 Round Minimal None         Visual Fields (Counting fingers)       Left Right    Full Full         Extraocular Movement       Right Left    Full Full         Neuro/Psych     Oriented x3: Yes   Mood/Affect: Normal         Dilation     Both eyes: 1.0% Mydriacyl, 2.5% Phenylephrine @ 8:49 AM           Slit Lamp and Fundus Exam     External Exam       Right Left   External Normal Normal         Slit Lamp Exam       Right Left   Lids/Lashes Normal Normal   Conjunctiva/Sclera White and quiet White and quiet   Cornea Clear Clear   Anterior Chamber Deep and quiet Deep and quiet   Iris Round and reactive Round and reactive   Lens Posterior chamber intraocular lens Posterior chamber intraocular lens  Anterior Vitreous Normal Normal         Fundus Exam       Right Left   Posterior Vitreous Posterior vitreous detachment Normal   Disc Normal Normal   C/D Ratio 0.45 0.45   Macula Normal Normal   Vessels no DR no DR   Periphery Normal Normal            IMAGING AND PROCEDURES  Imaging and Procedures for 05/23/21  OCT, Retina - OU - Both Eyes       Right Eye Quality was good. Scan locations included subfoveal. Central Foveal Thickness: 235. Progression has no prior data. Findings include cystoid macular edema, abnormal foveal contour.   Left Eye Quality was good. Central Foveal Thickness: 234. Findings include vitreomacular adhesion , abnormal foveal contour.   Notes OD with incidental posterior vitreous detachment, with perifoveal operculum.  Thus this could have been a spontaneous closure prior macular hole right eye with residual intraretinal fluid, CME.  CME that is present in the foveal  avascular zone is very minor and none distorting             ASSESSMENT/PLAN:  Diabetes mellitus without complication ([place]) The patient has diabetes without any evidence of retinopathy. The patient advised to maintain good blood glucose control, excellent blood pressure control, and favorable levels of cholesterol, low density lipoprotein, and high density lipoproteins. Follow up in 1 year was recommended. Explained that fluctuations in visual acuity , or "out of focus", may result from large variations of blood sugar control.   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  Nonetheless his sleep apnea is well treated in light likely to cause progression of of maculopathy  OSA on CPAP Compliant with nasal prong type of BiPAP     ICD-10-CM   1. Cystoid macular edema, right eye  H35.351 OCT, Retina - OU - Both Eyes    2. Diabetes mellitus without complication ([place])  Q94.7     3. Posterior vitreous detachment of right eye  H43.811 OCT, Retina - OU - Both Eyes    4. OSA on CPAP  G47.33    Z99.89       1.  OD with only OCT evidence of solitary micro cystoid change in the foveal avascular zone.  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  Thus this could be a result of a spontaneous closure of a partial macular hole.  More over patient has a long history of of untreated sleep apnea but now only treated in the last 5 years successfully with improved symptomatology.  This could have been MAC-TEL that is now resolved and this tiny CME.  In the absence of diabetic retinopathy diffusely OU this is not diabetic retinopathy related.  2.  No specific therapy is warranted.   Patient understands he has no major pathology from this condition and solitary changes such as this is not likely to change over his lifetime.  Should visual acuity changes develop he should promptly notify Dr. Herbert Deaner or this office  3.  I urged the patient to continue on BiPAP, sleep apnea therapy as long as a condition exists, as this will prevent or minimize the risk of nightly hypoxia to the  macula and superimposed hypertensive retinopathy that occurs periodically with untreated sleep apnea  Ophthalmic Meds Ordered this visit:  No orders of the defined types were placed in this encounter.      Return in about 1 year (around 05/23/2022) for DILATE OU, OCT.  There are no Patient Instructions on file for this visit.   Explained the diagnoses, plan, and follow up with the patient and they expressed understanding.  Patient expressed understanding of the importance of proper follow up care.   Clent Demark Sueko Dimichele M.D. Diseases & Surgery of the Retina and Vitreous Retina [facility] Diabetic [place] 05/23/21     Abbreviations: M myopia (nearsighted); A astigmatism; H hyperopia (farsighted); P presbyopia; Mrx spectacle prescription;  CTL contact lenses; OD right eye; OS left eye; OU both eyes  XT exotropia; ET esotropia; PEK punctate epithelial keratitis; PEE punctate epithelial erosions; DES dry eye syndrome; MGD meibomian gland dysfunction; ATs artificial tears; PFAT's preservative free artificial tears; [place] nuclear sclerotic cataract; PSC posterior subcapsular cataract; ERM epi-retinal membrane; PVD posterior vitreous detachment; RD retinal detachment; DM diabetes mellitus; DR diabetic retinopathy; NPDR non-proliferative diabetic retinopathy; PDR proliferative diabetic retinopathy; CSME clinically significant macular edema; DME diabetic macular edema; dbh dot blot hemorrhages; CWS cotton wool spot; POAG primary open angle glaucoma; C/D cup-to-disc ratio; HVF humphrey visual field; GVF goldmann visual  field; OCT optical coherence tomography; IOP intraocular pressure; BRVO Branch retinal vein occlusion; CRVO central retinal vein occlusion; CRAO central retinal artery occlusion; BRAO branch retinal artery occlusion; RT retinal tear; SB scleral buckle; PPV pars plana vitrectomy; VH Vitreous hemorrhage; PRP panretinal laser photocoagulation; IVK intravitreal kenalog; VMT vitreomacular traction; MH Macular hole;  NVD neovascularization of the disc; NVE neovascularization elsewhere; AREDS age related eye disease study; ARMD age related macular degeneration; POAG primary open angle glaucoma; EBMD epithelial/anterior basement membrane dystrophy; ACIOL anterior chamber intraocular lens; IOL intraocular lens; PCIOL posterior chamber intraocular lens; Phaco/IOL phacoemulsification with intraocular lens placement; [place] photorefractive keratectomy; LASIK laser assisted in situ keratomileusis; HTN hypertension; DM diabetes mellitus; COPD chronic obstructive pulmonary disease

## 2021-05-23 NOTE — Assessment & Plan Note (Signed)
The patient has diabetes without any evidence of retinopathy. The patient advised to maintain good blood glucose control, excellent blood pressure control, and favorable levels of cholesterol, low density lipoprotein, and high density lipoproteins. Follow up in 1 year was recommended. Explained that fluctuations in visual acuity , or "out of focus", may result from large variations of blood sugar control.   No detectable diabetic retinopathy

## 2021-06-12 ENCOUNTER — Ambulatory Visit: Payer: HMO | Admitting: Adult Health

## 2021-06-12 ENCOUNTER — Encounter: Payer: Self-pay | Admitting: Adult Health

## 2021-06-12 VITALS — BP 146/82 | HR 104 | Ht 68.0 in | Wt 170.2 lb

## 2021-06-12 DIAGNOSIS — Z9989 Dependence on other enabling machines and devices: Secondary | ICD-10-CM

## 2021-06-12 DIAGNOSIS — G4733 Obstructive sleep apnea (adult) (pediatric): Secondary | ICD-10-CM | POA: Diagnosis not present

## 2021-06-12 DIAGNOSIS — Z8673 Personal history of transient ischemic attack (TIA), and cerebral infarction without residual deficits: Secondary | ICD-10-CM | POA: Diagnosis not present

## 2021-06-12 NOTE — Progress Notes (Signed)
I agree with the above plan 

## 2021-06-12 NOTE — Patient Instructions (Addendum)
Your Plan:  Continue current plan - no changes today  Your DME company is [employer] - I will send an order to them today for you to get new supplies    Follow up in 1 year or call earlier if needed     Thank you for coming to see [place] at [facility]. I hope we have been able to provide you high quality care today.  You may receive a patient satisfaction survey over the next few weeks. We would appreciate your feedback and comments so that we may continue to improve ourselves and the health of our patients.

## 2021-06-12 NOTE — Progress Notes (Signed)
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  Today, 06/12/2021, Adam Quinn is being seen for yearly stroke and CPAP follow-up.   Stroke: Patient of Dr. Leonie Man.  Residual deficits of right side lips and right thumb.  He does report occasional imbalance/lightheadedness which is not new and denies worsening.  Denies new stroke/TIA symptoms.  Continues on clopidogrel and simvastatin for secondary stroke prevention. Blood pressure today 146/82.  Glucose levels stable with recent A1c 5.5 (per patient report). Report recent adjustments to insulin regimen due to occasional episodes of hypoglycemia. Continues to follow closely with PCP Dr. Coral Spikes for HTN, HLD and DM management.  No further concerns at this time.  OSA on CPAP: He continues to tolerate CPAP well without difficulty.  He is in need of new supplies which he believes is likely contributing to his leak.  No CPAP related concerns at this time.        REVIEW OF SYSTEMS: Full 14 system review of systems performed and notable only for those listed, all others are neg: Numbness, tingling, and sleep apnea  Epworth Sleepiness Scale How likely are you to doze in the following situations:  0 = not likely, 1 = slight chance, 2 = moderate chance, 3 = high chance   Sitting and Reading? 0 Watching Television? 2 Sitting inactive in a public place (theater or meeting)? 0 Lying down in the afternoon when circumstances permit?  2 Sitting and talking to someone? 0 Sitting quietly after lunch without alcohol? 0 In a car, while stopped for a few minutes in traffic? 0 As a passenger in a car for an hour without a break? 0  Total = 4      ALLERGIES: Allergies  Allergen Reactions   Aspirin Hives   Shrimp [Shellfish Allergy] Hives    Just shrimp     HOME MEDICATIONS: Outpatient Medications Prior to Visit  Medication Sig Dispense Refill   Continuous Blood Gluc Receiver (FREESTYLE LIBRE 2 READER) DEVI USE AS DIRECTED FOR CONTINUOUS BLOOD SUGAR MONITORING 1 each 3   dorzolamide (TRUSOPT) 2 % ophthalmic solution INSTILL 1 DROP IN BOTH EYES TWICE A DAY 10 mL 2   Dulaglutide (TRULICITY) 1.5 IO/2.7OJ SOPN Inject under the skin once a week 6 mL 1   gabapentin (NEURONTIN) 300 MG capsule TAKE 1 CAPSULE BY MOUTH ONCE DAILY 90 capsule 0   glimepiride (AMARYL) 4 MG tablet Take by mouth.     ibuprofen (ADVIL,MOTRIN) 800 MG tablet Take 800 mg by mouth every 8 (eight) hours as needed.     lansoprazole (PREVACID) 30 MG capsule TAKE 1 CAPSULE BY MOUTH TWICE DAILY 180 capsule 0   loratadine-pseudoephedrine (CLARITIN-D 12-HOUR) 5-120 MG tablet Take 1 tablet by mouth at bedtime as needed for allergies. Alavert-D     LUMIGAN 0.01 % SOLN INSTILL 1 DROP INTO BOTH EYES AT BEDTIME 2.5 mL 2   Melatonin 10 MG TABS Take 10 mg by mouth at bedtime.     metFORMIN (  GLUCOPHAGE-XR) 500 MG 24 hr tablet TAKE 4 TABLETS BY MOUTH EVERY NIGHT AT BEDTIME 360 tablet 0   ondansetron (ZOFRAN ODT) 4 MG disintegrating tablet Take 1 tablet (4 mg total) by mouth every 8 (eight) hours as needed for nausea or vomiting. 20 tablet 0   Probiotic Product (RA PROBIOTIC GUMMIES PO) Take 1 capsule by mouth at bedtime.     sodium bicarbonate 650 MG tablet TAKE 1 TABLET BY MOUTH TWICE DAILY 180 tablet 1   TRESIBA FLEXTOUCH 200 UNIT/ML FlexTouch Pen Based on Sliding Scale patient has devised.     clopidogrel (PLAVIX) 75 MG tablet TAKE 1 TABLET BY MOUTH ONCE DAILY 90  tablet 3   clopidogrel (PLAVIX) 75 MG tablet Take 1 tablet (75 mg total) by mouth daily. (Patient taking differently: Take 75 mg by mouth at bedtime.) 90 tablet 0   Continuous Blood Gluc Sensor (DEXCOM G6 SENSOR) MISC USE AS DIRECTED EVERY 10 DAYS 3 each 11   Continuous Blood Gluc Sensor (FREESTYLE LIBRE 2 SENSOR) MISC APPLY SENSOR AS DIRECTED EXTERNALLY TO BODY AS DIRECTED EVERY 2 WEEKS TO READ BLOOD SUGAR CONTINUOUSLY 6 each 3   Continuous Blood Gluc Sensor (FREESTYLE LIBRE SENSOR SYSTEM) MISC      Continuous Blood Gluc Transmit (DEXCOM G6 TRANSMITTER) MISC AS DIRECTED AS DIRECTED CHANGE EVERY 90 DAYS     dorzolamide (TRUSOPT) 2 % ophthalmic solution Place 1 drop into both eyes 2 (two) times a day.     FREESTYLE LITE test strip   3   gabapentin (NEURONTIN) 300 MG capsule Take 300 mg by mouth at bedtime.      gabapentin (NEURONTIN) 300 MG capsule TAKE 1 CAPSULE BY MOUTH ONCE DAILY 90 capsule 1   gabapentin (NEURONTIN) 300 MG capsule TAKE 1 CAPSULE BY MOUTH ONCE DAILY 90 capsule 0   Insulin Pen Needle 31G X 5 MM MISC USE AS DIRECTED TO ADMINISTER VICTOZA ONCE DAILY 100 each 3   lansoprazole (PREVACID) 30 MG capsule Take 30 mg by mouth 2 (two) times daily.   1   lansoprazole (PREVACID) 30 MG capsule TAKE 1 CAPSULE BY MOUTH TWICE DAILY 180 capsule 1   lansoprazole (PREVACID) 30 MG capsule TAKE 1 CAPSULE BY MOUTH TWICE DAILY 180 capsule 0   lisinopril (ZESTRIL) 2.5 MG tablet Take 2.5 mg by mouth daily.     lisinopril (ZESTRIL) 2.5 MG tablet TAKE 1 TABLET BY MOUTH ONCE DAILY 90 tablet 3   LUMIGAN 0.01 % SOLN Place 1 drop into both eyes at bedtime.  3   metFORMIN (GLUCOPHAGE-XR) 500 MG 24 hr tablet Take 2,000 mg by mouth daily with breakfast.      metFORMIN (GLUCOPHAGE-XR) 500 MG 24 hr tablet TAKE 4 TABLETS BY MOUTH AT BEDTIME ONCE A DAY 360 tablet 1   metFORMIN (GLUCOPHAGE-XR) 500 MG 24 hr tablet Take 2 tablets by mouth twice a day 360 tablet 0   PENTIPS 31G X 5 MM MISC USE TO ADMINISTER VICTOZA  ONCE DAILY (Patient not taking: Reported on 01/03/2021)     sertraline (ZOLOFT) 100 MG tablet Take 100 mg by mouth daily.   1   sertraline (ZOLOFT) 100 MG tablet TAKE 1 TABLET BY MOUTH ONCE DAILY 90 tablet 3   simvastatin (ZOCOR) 20 MG tablet Take 20 mg by mouth every evening.     simvastatin (ZOCOR) 20 MG tablet TAKE 1 TABLET BY MOUTH ONCE DAILY 90 tablet 1   sodium bicarbonate 650 MG tablet Take 1 tablet (650 mg total) by mouth 2 (  two) times daily. 60 tablet 3   Travoprost, BAK Free, (TRAVATAN) 0.004 % SOLN ophthalmic solution Place 1 drop into both eyes nightly.     TRULICITY 1.5 EH/6.3JS SOPN USE AS DIRECTED ONCE WEEKLY     No facility-administered medications prior to visit.    PAST MEDICAL HISTORY: Past Medical History:  Diagnosis Date   Allergy    seasonal   Anxiety    Cataract    cataract surgery bilaterally resolved issues   Central scotoma    Chronic kidney disease    small protien showing uses Lisinopril for this   Diverticulitis    Diverticulosis    DM (diabetes mellitus) ([place])    GERD (gastroesophageal reflux disease)    Glaucoma    History of colon polyps    History of hiatal hernia    History of kidney stones    x1   Iron deficiency anemia    Iron infusion 04/29/2019   Neuropathy    OSA on CPAP    Stroke ([place])    Wears glasses    READING    PAST SURGICAL HISTORY: Past Surgical History:  Procedure Laterality Date   cataract surgery     bilateral   COLONOSCOPY     COLONOSCOPY N/A 06/04/2015   Procedure: COLONOSCOPY;  Surgeon: Inda Castle, MD;  Location: [facility];  Service: Endoscopy;  Laterality: N/A;   COLONOSCOPY WITH PROPOFOL N/A 05/09/2019   Procedure: COLONOSCOPY WITH PROPOFOL;  Surgeon: Doran Stabler, MD;  Location: [facility];  Service: Gastroenterology;  Laterality: N/A;   ESOPHAGOGASTRODUODENOSCOPY N/A 06/04/2015   Procedure: ESOPHAGOGASTRODUODENOSCOPY (EGD);  Surgeon: Inda Castle, MD;  Location: [facility];  Service: Endoscopy;   Laterality: N/A;   UPPER GASTROINTESTINAL ENDOSCOPY     WISDOM TOOTH EXTRACTION     with sedation    FAMILY HISTORY: Family History  Problem Relation Age of Onset   Colon polyps Father    Lung cancer Father    Diabetes Mother    Dementia Mother    Diabetes Sister        x 2   Stroke Maternal Grandmother    Stroke Paternal Grandfather    Colon cancer Neg Hx    Rectal cancer Neg Hx    Stomach cancer Neg Hx    Pancreatic cancer Neg Hx     SOCIAL HISTORY: Social History   Socioeconomic History   Marital status: Married    Spouse name: Not on file   Number of children: 3   Years of education: Not on file   Highest education level: Not on file  Occupational History   Occupation: Optometrist  Tobacco Use   Smoking status: Former    Types: Cigarettes    Quit date: 10/04/1997    Years since quitting: 23.7   Smokeless tobacco: Never  Vaping Use   Vaping Use: Never used  Substance and Sexual Activity   Alcohol use: No   Drug use: No   Sexual activity: Not on file  Other Topics Concern   Not on file  Social History Narrative   Not on file   Social Determinants of Health   Financial Resource Strain: Not on file  Food Insecurity: No Food Insecurity   Worried About Running Out of Food in the Last Year: Never true   [place] in the Last Year: Never true  Transportation Needs: No Transportation Needs   Lack of Transportation (Medical): No   Lack of Transportation (Non-Medical): No  Physical Activity: Not on file  Stress: Not on file  Social Connections: Not on file  Intimate Partner Violence: Not on file     PHYSICAL EXAM  Vitals:   06/12/21 0926  BP: (!) 146/82  Pulse: (!) 104  Weight: 170 lb 3.2 oz (77.2 kg)  Height: 5\' 8"  (1.727 m)   Body mass index is 25.88 kg/m.  Generalized: Obese middle-aged Caucasian male in no acute distress  Head: normocephalic and atraumatic   Neck: Supple, no carotid bruits  Cardiac: Regular rate rhythm, no murmur   Musculoskeletal: No deformity   Neurological examination   Mentation: Alert oriented to time, place, Attention span and concentration appropriate. Recent and remote memory intact.  Follows all commands speech and language fluent.  Cranial nerve II-XII: Pupils were equal round reactive to light extraocular movements were full, visual field were full on confrontational test. Facial sensation and strength were normal. hearing was intact to finger rubbing bilaterally. Uvula tongue midline. head turning and shoulder shrug were normal and symmetric.Tongue protrusion into cheek strength was normal. Motor: normal bulk and tone, full strength in the BUE, BLE, Sensory: normal and symmetric to light touch, in the upper and lower extremities Coordination: finger-nose-finger, heel-to-shin bilaterally, no dysmetria Reflexes: 1+ upper lower and symmetric, plantar responses were flexor bilaterally. Gait and Station: Rising up from seated position without assistance, normal stance,  moderate stride, good arm swing, smooth turning, able to perform tiptoe, and heel walking without difficulty. Tandem gait is performed with moderate difficulty     ASSESSMENT AND PLAN 42 year Caucasian male with history of multiple bilateral subcortical lacunar infarcts in 2017, left thalamic infarct 06/2019 secondary to small vessel disease with residual right-sided numbness, vertebral artery stenosis, DM, HTN, HLD and OSA on CPAP.  Continues to be followed in this office for stroke and CPAP follow-up    History of multiple strokes: -Continue Plavix and simvastatin for secondary stroke prevention -Continue to follow with PCP for HTN, HLD and DM management -Chronic vertebral artery stenosis with prior CUS 07/2019 no evidence of stenosis bilateral ICA with right vertebral artery antegrade flow in left vertebral artery not visualized.  Remains asymptomatic and advised importance of continuing clopidogrel and statin along with  managing stroke risk factors and ongoing use of CPAP.  No indication at this time for repeat or surveillance monitoring as stenosis is chronic and he is currently asymptomatic. -maintain strict control of hypertension with blood pressure goal below 130/90, diabetes with hemoglobin A1c goal below 7% and lipids with LDL cholesterol goal below 70 mg/dL.   OSA on CPAP: -Encouraged ongoing excellent compliance at 97% with optimal residual AHI of 2.4 -No change to settings at this time as not indicated -Advised to continue to follow with DME company [employer] for any needed supplies or CPAP related concerns   Follow-up in 1 year or call earlier if needed  CC:  [place] provider: Dr. Leonie Man (stroke) [place] provider: Dr. Brett Fairy (sleep) Kelton Pillar, MD    I spent 36 minutes of face-to-face and non-face-to-face time with patient.  This included previsit chart review, lab review, study review, order entry, electronic health record documentation, patient education regarding prior stroke history including secondary stroke prevention measures and aggressive stroke risk factor management, residual deficits, review of CPAP compliance report and further discussion and answered all other questions to patient satisfaction  Frann Rider, [facility]  [facility] [address] [place], [place] [address]  Phone [phone] Fax [phone] Note: This document was  prepared with digital dictation and possible smart phrase technology. Any transcriptional errors that result from this process are unintentional.

## 2021-06-19 ENCOUNTER — Other Ambulatory Visit (HOSPITAL_BASED_OUTPATIENT_CLINIC_OR_DEPARTMENT_OTHER): Payer: Self-pay

## 2021-06-19 ENCOUNTER — Other Ambulatory Visit: Payer: Self-pay | Admitting: Gastroenterology

## 2021-06-19 ENCOUNTER — Other Ambulatory Visit: Payer: Self-pay | Admitting: Nurse Practitioner

## 2021-06-19 DIAGNOSIS — I6381 Other cerebral infarction due to occlusion or stenosis of small artery: Secondary | ICD-10-CM

## 2021-06-19 MED ORDER — CLOPIDOGREL BISULFATE 75 MG PO TABS
75.0000 mg | ORAL_TABLET | Freq: Every day | ORAL | 0 refills | Status: DC
  Filled 2021-06-19: qty 90, 90d supply, fill #0

## 2021-06-24 ENCOUNTER — Other Ambulatory Visit (HOSPITAL_BASED_OUTPATIENT_CLINIC_OR_DEPARTMENT_OTHER): Payer: Self-pay

## 2021-06-26 ENCOUNTER — Other Ambulatory Visit (HOSPITAL_BASED_OUTPATIENT_CLINIC_OR_DEPARTMENT_OTHER): Payer: Self-pay

## 2021-06-27 ENCOUNTER — Other Ambulatory Visit (HOSPITAL_BASED_OUTPATIENT_CLINIC_OR_DEPARTMENT_OTHER): Payer: Self-pay

## 2021-06-27 ENCOUNTER — Encounter: Payer: Self-pay | Admitting: Family

## 2021-06-27 MED ORDER — SODIUM BICARBONATE 650 MG PO TABS
ORAL_TABLET | ORAL | 2 refills | Status: DC
  Filled 2021-06-27: qty 180, 90d supply, fill #0
  Filled 2021-10-07: qty 180, 90d supply, fill #1
  Filled 2022-01-15: qty 180, 90d supply, fill #2

## 2021-06-28 DIAGNOSIS — G4733 Obstructive sleep apnea (adult) (pediatric): Secondary | ICD-10-CM | POA: Diagnosis not present

## 2021-07-05 ENCOUNTER — Other Ambulatory Visit (HOSPITAL_BASED_OUTPATIENT_CLINIC_OR_DEPARTMENT_OTHER): Payer: Self-pay

## 2021-07-05 MED ORDER — SERTRALINE HCL 100 MG PO TABS
ORAL_TABLET | ORAL | 0 refills | Status: DC
  Filled 2021-07-05: qty 90, 90d supply, fill #0

## 2021-07-08 ENCOUNTER — Other Ambulatory Visit (HOSPITAL_BASED_OUTPATIENT_CLINIC_OR_DEPARTMENT_OTHER): Payer: Self-pay

## 2021-07-17 ENCOUNTER — Other Ambulatory Visit (HOSPITAL_BASED_OUTPATIENT_CLINIC_OR_DEPARTMENT_OTHER): Payer: Self-pay

## 2021-07-24 ENCOUNTER — Other Ambulatory Visit (HOSPITAL_BASED_OUTPATIENT_CLINIC_OR_DEPARTMENT_OTHER): Payer: Self-pay

## 2021-07-24 ENCOUNTER — Telehealth: Payer: Self-pay | Admitting: Pharmacist

## 2021-07-24 DIAGNOSIS — Z5181 Encounter for therapeutic drug level monitoring: Secondary | ICD-10-CM

## 2021-07-24 DIAGNOSIS — Z79899 Other long term (current) drug therapy: Secondary | ICD-10-CM

## 2021-07-24 NOTE — Progress Notes (Signed)
[facility])                                            [place] Team                                        Statin Quality Measure Assessment    07/24/2021  Adam Quinn 1956/09/22 [id number]   Per review of chart and payor information, patient has a diagnosis of diabetes but is not currently filling a statin prescription.  This places patient into the SUPD (Statin Use In Patients with Diabetes) measure for CMS.    Telephone call with patient today. Patient reports that he has lost 30 pounds, is off of insulin and only using his Trulicity at this time. Reports  that his last A1c was 5.5% (02/2021). Patient reports discontinuing Simvastatin 20 mg. Reports no intolerances with medications.  Patient reports no issues with affording his medications at this time. Information collected from patient today will be shared with Dr. Laurann Montana for upcoming [place] in October.   The ASCVD Risk score Adam Bussing DC Jr., Adam al., Adam Quinn) failed to calculate for the following reasons:   The patient has a prior MI or stroke diagnosis 08/04/2019     Component Value Date/Time   CHOL 120 08/04/2019 0956   TRIG 82 08/04/2019 0956   HDL 53 08/04/2019 0956   CHOLHDL 2.3 08/04/2019 0956   LDLCALC 51 08/04/2019 0956    Please consider ONE of the following recommendations:  Initiate high intensity statin Atorvastatin '40mg'$  once daily, #90, 3 refills   Rosuvastatin '20mg'$  once daily, #90, 3 refills    Initiate moderate intensity          statin with reduced frequency if prior          statin intolerance 1x weekly, #13, 3 refills   2x weekly, #26, 3 refills   3x weekly, #39, 3 refills    Code for past statin intolerance or  other exclusions (required annually)  Provider Requirements: Associate code during an office visit or telehealth encounter  Drug Induced Myopathy G72.0   Myopathy, unspecified G72.9   Myositis, unspecified M60.9   Rhabdomyolysis  M62.82   Cirrhosis of liver K74.69   Prediabetes R73.03   PCOS E28.2   Adverse effect of antihyperlipidemic and antiarteriosclerotic drugs, initial encounter [place], PharmD [place] Clinical Pharmacist Direct Dial: [phone]

## 2021-07-25 ENCOUNTER — Other Ambulatory Visit (HOSPITAL_BASED_OUTPATIENT_CLINIC_OR_DEPARTMENT_OTHER): Payer: Self-pay

## 2021-07-30 ENCOUNTER — Other Ambulatory Visit (HOSPITAL_BASED_OUTPATIENT_CLINIC_OR_DEPARTMENT_OTHER): Payer: Self-pay

## 2021-07-31 ENCOUNTER — Ambulatory Visit: Payer: 59

## 2021-07-31 ENCOUNTER — Other Ambulatory Visit: Payer: Self-pay

## 2021-07-31 ENCOUNTER — Inpatient Hospital Stay (HOSPITAL_BASED_OUTPATIENT_CLINIC_OR_DEPARTMENT_OTHER)
Admission: EM | Admit: 2021-07-31 | Discharge: 2021-08-03 | DRG: 393 | Disposition: A | Discharge status: Home / Self Care | Payer: HMO | Attending: Internal Medicine | Admitting: Internal Medicine

## 2021-07-31 ENCOUNTER — Encounter (HOSPITAL_BASED_OUTPATIENT_CLINIC_OR_DEPARTMENT_OTHER): Payer: Self-pay | Admitting: *Deleted

## 2021-07-31 ENCOUNTER — Emergency Department (HOSPITAL_BASED_OUTPATIENT_CLINIC_OR_DEPARTMENT_OTHER): Payer: HMO

## 2021-07-31 DIAGNOSIS — K5792 Diverticulitis of intestine, part unspecified, without perforation or abscess without bleeding: Secondary | ICD-10-CM | POA: Diagnosis present

## 2021-07-31 DIAGNOSIS — D62 Acute posthemorrhagic anemia: Secondary | ICD-10-CM | POA: Diagnosis present

## 2021-07-31 DIAGNOSIS — Z20822 Contact with and (suspected) exposure to covid-19: Secondary | ICD-10-CM | POA: Diagnosis present

## 2021-07-31 DIAGNOSIS — Z833 Family history of diabetes mellitus: Secondary | ICD-10-CM | POA: Diagnosis not present

## 2021-07-31 DIAGNOSIS — Z794 Long term (current) use of insulin: Secondary | ICD-10-CM | POA: Diagnosis not present

## 2021-07-31 DIAGNOSIS — Z8371 Family history of colonic polyps: Secondary | ICD-10-CM | POA: Diagnosis not present

## 2021-07-31 DIAGNOSIS — I10 Essential (primary) hypertension: Secondary | ICD-10-CM | POA: Diagnosis present

## 2021-07-31 DIAGNOSIS — Z9841 Cataract extraction status, right eye: Secondary | ICD-10-CM

## 2021-07-31 DIAGNOSIS — G4733 Obstructive sleep apnea (adult) (pediatric): Secondary | ICD-10-CM | POA: Diagnosis present

## 2021-07-31 DIAGNOSIS — Z791 Long term (current) use of non-steroidal anti-inflammatories (NSAID): Secondary | ICD-10-CM | POA: Diagnosis not present

## 2021-07-31 DIAGNOSIS — Z79890 Hormone replacement therapy (postmenopausal): Secondary | ICD-10-CM

## 2021-07-31 DIAGNOSIS — Z87442 Personal history of urinary calculi: Secondary | ICD-10-CM

## 2021-07-31 DIAGNOSIS — Z7902 Long term (current) use of antithrombotics/antiplatelets: Secondary | ICD-10-CM | POA: Diagnosis not present

## 2021-07-31 DIAGNOSIS — Z9842 Cataract extraction status, left eye: Secondary | ICD-10-CM | POA: Diagnosis not present

## 2021-07-31 DIAGNOSIS — E1142 Type 2 diabetes mellitus with diabetic polyneuropathy: Secondary | ICD-10-CM | POA: Diagnosis present

## 2021-07-31 DIAGNOSIS — Z91013 Allergy to seafood: Secondary | ICD-10-CM | POA: Diagnosis not present

## 2021-07-31 DIAGNOSIS — F32A Depression, unspecified: Secondary | ICD-10-CM | POA: Diagnosis present

## 2021-07-31 DIAGNOSIS — K921 Melena: Secondary | ICD-10-CM

## 2021-07-31 DIAGNOSIS — Z886 Allergy status to analgesic agent status: Secondary | ICD-10-CM

## 2021-07-31 DIAGNOSIS — Z79899 Other long term (current) drug therapy: Secondary | ICD-10-CM

## 2021-07-31 DIAGNOSIS — R1012 Left upper quadrant pain: Secondary | ICD-10-CM

## 2021-07-31 DIAGNOSIS — Z8673 Personal history of transient ischemic attack (TIA), and cerebral infarction without residual deficits: Secondary | ICD-10-CM | POA: Diagnosis not present

## 2021-07-31 DIAGNOSIS — K5732 Diverticulitis of large intestine without perforation or abscess without bleeding: Secondary | ICD-10-CM | POA: Diagnosis not present

## 2021-07-31 DIAGNOSIS — Z7984 Long term (current) use of oral hypoglycemic drugs: Secondary | ICD-10-CM

## 2021-07-31 DIAGNOSIS — Z87891 Personal history of nicotine dependence: Secondary | ICD-10-CM | POA: Diagnosis not present

## 2021-07-31 DIAGNOSIS — K429 Umbilical hernia without obstruction or gangrene: Secondary | ICD-10-CM | POA: Diagnosis not present

## 2021-07-31 DIAGNOSIS — Z801 Family history of malignant neoplasm of trachea, bronchus and lung: Secondary | ICD-10-CM | POA: Diagnosis not present

## 2021-07-31 DIAGNOSIS — K559 Vascular disorder of intestine, unspecified: Secondary | ICD-10-CM | POA: Diagnosis present

## 2021-07-31 DIAGNOSIS — H409 Unspecified glaucoma: Secondary | ICD-10-CM | POA: Diagnosis present

## 2021-07-31 DIAGNOSIS — K5733 Diverticulitis of large intestine without perforation or abscess with bleeding: Secondary | ICD-10-CM | POA: Diagnosis present

## 2021-07-31 DIAGNOSIS — D500 Iron deficiency anemia secondary to blood loss (chronic): Secondary | ICD-10-CM | POA: Diagnosis not present

## 2021-07-31 DIAGNOSIS — N289 Disorder of kidney and ureter, unspecified: Secondary | ICD-10-CM | POA: Diagnosis present

## 2021-07-31 DIAGNOSIS — Z9989 Dependence on other enabling machines and devices: Secondary | ICD-10-CM | POA: Diagnosis not present

## 2021-07-31 DIAGNOSIS — E119 Type 2 diabetes mellitus without complications: Secondary | ICD-10-CM

## 2021-07-31 DIAGNOSIS — Z823 Family history of stroke: Secondary | ICD-10-CM

## 2021-07-31 DIAGNOSIS — K625 Hemorrhage of anus and rectum: Secondary | ICD-10-CM

## 2021-07-31 DIAGNOSIS — K219 Gastro-esophageal reflux disease without esophagitis: Secondary | ICD-10-CM

## 2021-07-31 DIAGNOSIS — K869 Disease of pancreas, unspecified: Secondary | ICD-10-CM | POA: Diagnosis present

## 2021-07-31 DIAGNOSIS — K922 Gastrointestinal hemorrhage, unspecified: Secondary | ICD-10-CM | POA: Diagnosis not present

## 2021-07-31 DIAGNOSIS — R000 Tachycardia, unspecified: Secondary | ICD-10-CM | POA: Diagnosis present

## 2021-07-31 LAB — COMPREHENSIVE METABOLIC PANEL
ALT: 23 U/L (ref 0–44)
AST: 22 U/L (ref 15–41)
Albumin: 3.8 g/dL (ref 3.5–5.0)
Alkaline Phosphatase: 75 U/L (ref 38–126)
Anion gap: 9 (ref 5–15)
BUN: 20 mg/dL (ref 8–23)
CO2: 25 mmol/L (ref 22–32)
Calcium: 9 mg/dL (ref 8.9–10.3)
Chloride: 104 mmol/L (ref 98–111)
Creatinine, Ser: 1.28 mg/dL — ABNORMAL HIGH (ref 0.61–1.24)
GFR, Estimated: >60 mL/min (ref >60)
Glucose, Bld: 121 mg/dL — ABNORMAL HIGH (ref 70–99)
Potassium: 4.5 mmol/L (ref 3.5–5.1)
Sodium: 138 mmol/L (ref 135–145)
Total Bilirubin: 0.3 mg/dL (ref 0.3–1.2)
Total Protein: 6.3 g/dL — ABNORMAL LOW (ref 6.5–8.1)

## 2021-07-31 LAB — OCCULT BLOOD X 1 CARD TO LAB, STOOL: Fecal Occult Bld: POSITIVE — AB

## 2021-07-31 LAB — LACTIC ACID, PLASMA
Lactic Acid, Venous: 2.5 mmol/L (ref 0.5–1.9)
Lactic Acid, Venous: 2.4 mmol/L (ref 0.5–1.9)

## 2021-07-31 LAB — CBC WITH DIFFERENTIAL/PLATELET
Abs Immature Granulocytes: 0.07 10*3/uL (ref 0.00–0.07)
Basophils Absolute: 0.1 10*3/uL (ref 0.0–0.1)
Basophils Relative: 1 %
Eosinophils Absolute: 0.2 10*3/uL (ref 0.0–0.5)
Eosinophils Relative: 2 %
HCT: 35.8 % — ABNORMAL LOW (ref 39.0–52.0)
Hemoglobin: 12.1 g/dL — ABNORMAL LOW (ref 13.0–17.0)
Immature Granulocytes: 1 %
Lymphocytes Relative: 15 %
Lymphs Abs: 1.6 10*3/uL (ref 0.7–4.0)
MCH: 32.1 pg (ref 26.0–34.0)
MCHC: 33.8 g/dL (ref 30.0–36.0)
MCV: 95 fL (ref 80.0–100.0)
Monocytes Absolute: 0.7 10*3/uL (ref 0.1–1.0)
Monocytes Relative: 7 %
Neutro Abs: 8.2 10*3/uL — ABNORMAL HIGH (ref 1.7–7.7)
Neutrophils Relative %: 74 %
Platelets: 285 10*3/uL (ref 150–400)
RBC: 3.77 MIL/uL — ABNORMAL LOW (ref 4.22–5.81)
RDW: 13.2 % (ref 11.5–15.5)
WBC: 10.8 10*3/uL — ABNORMAL HIGH (ref 4.0–10.5)
nRBC: 0 % (ref 0.0–0.2)

## 2021-07-31 LAB — RESP PANEL BY RT-PCR (FLU A&B, COVID) ARPGX2
Influenza A by PCR: NEGATIVE
Influenza B by PCR: NEGATIVE
SARS Coronavirus 2 by RT PCR: NEGATIVE

## 2021-07-31 LAB — PROTIME-INR
INR: 1 (ref 0.8–1.2)
Prothrombin Time: 13.4 seconds (ref 11.4–15.2)

## 2021-07-31 LAB — HEMOGLOBIN AND HEMATOCRIT, BLOOD
HCT: 29.9 % — ABNORMAL LOW (ref 39.0–52.0)
Hemoglobin: 10.4 g/dL — ABNORMAL LOW (ref 13.0–17.0)

## 2021-07-31 MED ORDER — IOHEXOL 350 MG/ML SOLN
100.0000 mL | Freq: Once | INTRAVENOUS | Status: AC | PRN
  Administered 2021-07-31: 100 mL via INTRAVENOUS

## 2021-07-31 MED ORDER — LACTATED RINGERS IV SOLN: INTRAVENOUS | Status: DC

## 2021-07-31 MED ORDER — PANTOPRAZOLE 80MG IVPB - SIMPLE MED
80.0000 mg | Freq: Once | INTRAVENOUS | Status: AC
  Administered 2021-07-31: 80 mg via INTRAVENOUS
  Filled 2021-07-31: qty 100

## 2021-07-31 MED ORDER — SODIUM CHLORIDE 0.9 % IV BOLUS
500.0000 mL | Freq: Once | INTRAVENOUS | Status: AC
  Administered 2021-07-31: 500 mL via INTRAVENOUS

## 2021-07-31 MED ORDER — SODIUM CHLORIDE 0.9 % IV SOLN
INTRAVENOUS | Status: DC | PRN
  Administered 2021-07-31 – 2021-08-01 (×2): 250 mL via INTRAVENOUS

## 2021-07-31 MED ORDER — SODIUM CHLORIDE 0.9 % IV BOLUS
1000.0000 mL | Freq: Once | INTRAVENOUS | Status: AC
  Administered 2021-07-31: 1000 mL via INTRAVENOUS

## 2021-07-31 MED ORDER — PIPERACILLIN-TAZOBACTAM 3.375 G IVPB 30 MIN
3.3750 g | Freq: Once | INTRAVENOUS | Status: AC
  Administered 2021-07-31: 3.375 g via INTRAVENOUS
  Filled 2021-07-31: qty 50

## 2021-07-31 NOTE — ED Notes (Signed)
States has been having bright red blood from his rectum and is on blood thinners, pt appears pale, states he feels weak as well, immediately placed in exam room 6, placed on cont cardiac monitoring with cont POX and int NBP readings, IV established, blood work obtained

## 2021-07-31 NOTE — ED Triage Notes (Signed)
Approx 0600hrs awoke with blood from rectum, states is bright red in color, appears pale in color

## 2021-07-31 NOTE — ED Notes (Signed)
Patient transported to CT 

## 2021-07-31 NOTE — ED Notes (Signed)
NPO status initiated

## 2021-07-31 NOTE — ED Provider Notes (Signed)
[facility] Provider Note   CSN: [id number] Arrival date & time: 07/31/21  1536     History Chief Complaint  Patient presents with   Rectal Bleeding    DORSEL MONTEIRO is a 65 y.o. male with a PMHx of DM, anemia, diverticulosis, GERD, stroke, and HTN who presents to the ED today with c/o diarrhea and rectal bleeding. The patient reports that he first had diarrhea around 6 AM today and then noticed bright red blood in his stool around noon. He first took some anti-diarrheal medicine which helped at first but no longer alleviates his diarrhea. He states that it does not hurt to defecate but does endorse some lower abdominal pain. He also endorses lightheadedness and dizziness when standing up vs when lying down or sitting up. He is on Plavix but is not on ASA or any other blood thinners. This has never happened to him before. +Nausea, denies vomiting, fevers, dysuria, urinary frequency. No sick contacts. No recent changes in diet.    Rectal Bleeding Associated symptoms: abdominal pain, dizziness and light-headedness   Associated symptoms: no fever and no vomiting      Past Medical History:  Diagnosis Date   Allergy    seasonal   Anxiety    Cataract    cataract surgery bilaterally resolved issues   Central scotoma    Chronic kidney disease    small protien showing uses Lisinopril for this   Diverticulitis    Diverticulosis    DM (diabetes mellitus) ([place])    GERD (gastroesophageal reflux disease)    Glaucoma    History of colon polyps    History of hiatal hernia    History of kidney stones    x1   Iron deficiency anemia    Iron infusion 04/29/2019   Neuropathy    OSA on CPAP    Stroke ([place])    Wears glasses    READING    Patient Active Problem List   Diagnosis Date Noted   Cystoid macular edema, right eye 05/23/2021   Pain due to onychomycosis of toenails of both feet 12/21/2019   Diabetic neuropathy ([place]) 12/21/2019   Coagulation disorder  ([place]) 12/21/2019   Stroke ([place]) 06/27/2019   Depression 06/27/2019   Elevated hemoglobin ([facility]) 04/05/2018   Iron deficiency anemia due to chronic blood loss 05/26/2017   Malabsorption of iron 05/26/2017   HTN (hypertension) 03/30/2017   Hyperlipemia 03/30/2017   OSA on CPAP 03/30/2017   Snoring 07/08/2016   Uncontrolled REM sleep behavior disorder 07/08/2016   Lacunar infarct, acute ([place]) 06/26/2016   Numbness 06/26/2016   Occlusion and stenosis of vertebral artery 06/26/2016   Benign neoplasm of cecum 06/04/2015   Diverticulosis of colon without hemorrhage 06/04/2015   Internal hemorrhoids 06/04/2015   Gastritis and gastroduodenitis 06/04/2015   Microcytic anemia 04/13/2015   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) [id number]   CONSTIPATION 03/16/2008    Past Surgical History:  Procedure Laterality Date   cataract surgery     bilateral   COLONOSCOPY     COLONOSCOPY N/A 06/04/2015   Procedure: COLONOSCOPY;  Surgeon: Inda Castle, MD;  Location: [facility];  Service: Endoscopy;  Laterality: N/A;   COLONOSCOPY WITH PROPOFOL N/A 05/09/2019   Procedure: COLONOSCOPY WITH PROPOFOL;  Surgeon: Doran Stabler, MD;  Location: [facility];  Service: Gastroenterology;  Laterality: N/A;   ESOPHAGOGASTRODUODENOSCOPY N/A 06/04/2015  Procedure: ESOPHAGOGASTRODUODENOSCOPY (EGD);  Surgeon: Inda Castle, MD;  Location: [facility];  Service: Endoscopy;  Laterality: N/A;   UPPER GASTROINTESTINAL ENDOSCOPY     WISDOM TOOTH EXTRACTION     with sedation       Family History  Problem Relation Age of Onset   Colon polyps Father    Lung cancer Father    Diabetes Mother    Dementia Mother    Diabetes Sister        x 2   Stroke Maternal Grandmother    Stroke Paternal Grandfather    Colon cancer Neg Hx    Rectal cancer Neg Hx    Stomach cancer Neg Hx    Pancreatic cancer Neg Hx      Social History   Tobacco Use   Smoking status: Former    Types: Cigarettes    Quit date: 10/04/1997    Years since quitting: 23.8   Smokeless tobacco: Never  Vaping Use   Vaping Use: Never used  Substance Use Topics   Alcohol use: No   Drug use: No    Home Medications Prior to Admission medications   Medication Sig Start Date End Date Taking? Authorizing Provider  clopidogrel (PLAVIX) 75 MG tablet Take 1 tablet (75 mg total) by mouth daily. 06/19/21   Frann Rider, NP  Continuous Blood Gluc Receiver (FREESTYLE LIBRE 2 READER) DEVI USE AS DIRECTED FOR CONTINUOUS BLOOD SUGAR MONITORING 12/19/20 12/19/21  Kelton Pillar, MD  dorzolamide (TRUSOPT) 2 % ophthalmic solution INSTILL 1 DROP IN BOTH EYES TWICE A DAY 05/16/21     Dulaglutide (TRULICITY) 1.5 0000000 SOPN Inject under the skin once a week 04/02/21     gabapentin (NEURONTIN) 300 MG capsule TAKE 1 CAPSULE BY MOUTH ONCE DAILY 02/22/21 02/22/22  Kelton Pillar, MD  glimepiride (AMARYL) 4 MG tablet Take by mouth.    [provider]  ibuprofen (ADVIL,MOTRIN) 800 MG tablet Take 800 mg by mouth every 8 (eight) hours as needed.    [provider]  lansoprazole (PREVACID) 30 MG capsule TAKE 1 CAPSULE BY MOUTH TWICE DAILY 02/22/21 02/22/22  Kelton Pillar, MD  loratadine-pseudoephedrine (CLARITIN-D 12-HOUR) 5-120 MG tablet Take 1 tablet by mouth at bedtime as needed for allergies. Alavert-D    [provider]  LUMIGAN 0.01 % SOLN INSTILL 1 DROP INTO BOTH EYES AT BEDTIME 05/16/21     Melatonin 10 MG TABS Take 10 mg by mouth at bedtime.    [provider]  metFORMIN (GLUCOPHAGE-XR) 500 MG 24 hr tablet TAKE 4 TABLETS BY MOUTH EVERY NIGHT AT BEDTIME 02/22/21 02/22/22  Kelton Pillar, MD  ondansetron (ZOFRAN ODT) 4 MG disintegrating tablet Take 1 tablet (4 mg total) by mouth every 8 (eight) hours as needed for nausea or vomiting. 09/01/20   Joy, Shawn C, PA-C  Probiotic Product (RA PROBIOTIC GUMMIES PO) Take 1 capsule  by mouth at bedtime.    [provider]  sertraline (ZOLOFT) 100 MG tablet Take 1 tablet by mouth once daily 07/05/21     sodium bicarbonate 650 MG tablet TAKE 1 TABLET BY MOUTH TWICE DAILY 12/26/20 12/26/21  Kelton Pillar, MD  sodium bicarbonate 650 MG tablet Take 1 tablet by mouth twice a day 90 days 06/27/21     TRESIBA FLEXTOUCH 200 UNIT/ML FlexTouch Pen Based on Sliding Scale patient has devised. 02/23/20   [provider]    Allergies    Aspirin and Shrimp [shellfish allergy]  Review of Systems   Review of Systems  Constitutional:  Negative for chills and fever.  Respiratory:  Negative for cough and shortness of breath.   Cardiovascular:  Negative for chest pain and leg swelling.  Gastrointestinal:  Positive for abdominal pain, blood in stool, diarrhea and hematochezia. Negative for nausea and vomiting.  Genitourinary:  Negative for dysuria, frequency and urgency.  Skin: Negative.   Neurological:  Positive for dizziness and light-headedness. Negative for numbness.  Hematological: Negative.   Psychiatric/Behavioral: Negative.     Physical Exam Updated Vital Signs BP 130/80   Pulse (!) 113   Temp 98 F (36.7 C) (Oral)   Resp 16   Ht '5\' 8"'$  (1.727 m)   Wt 77.1 kg   SpO2 100%   BMI 25.85 kg/m   Physical Exam Exam conducted with a chaperone present.  Constitutional:      General: He is not in acute distress.    Appearance: Normal appearance.  HENT:     Head: Normocephalic and atraumatic.  Eyes:     Pupils: Pupils are equal, round, and reactive to light.  Cardiovascular:     Rate and Rhythm: Regular rhythm. Tachycardia present.     Heart sounds: No murmur heard.   No friction rub. No gallop.  Pulmonary:     Effort: Pulmonary effort is normal.     Breath sounds: Normal breath sounds. No wheezing, rhonchi or rales.  Abdominal:     Palpations: Abdomen is soft.     Tenderness: There is abdominal tenderness. There is no guarding or rebound.     Comments: TTP  around umbilical area, no other TTP  Musculoskeletal:        General: No swelling, tenderness or deformity.     Cervical back: Neck supple.  Skin:    General: Skin is warm and dry.  Neurological:     General: No focal deficit present.     Mental Status: He is alert and oriented to person, place, and time. Mental status is at baseline.  Psychiatric:        Mood and Affect: Mood normal.        Behavior: Behavior normal.    ED Results / Procedures / Treatments   Labs (all labs ordered are listed, but only abnormal results are displayed) Labs Reviewed  CBC WITH DIFFERENTIAL/PLATELET - Abnormal; Notable for the following components:      Result Value   WBC 10.8 (*)    RBC 3.77 (*)    Hemoglobin 12.1 (*)    HCT 35.8 (*)    Neutro Abs 8.2 (*)    All other components within normal limits  COMPREHENSIVE METABOLIC PANEL - Abnormal; Notable for the following components:   Glucose, Bld 121 (*)    Creatinine, Ser 1.28 (*)    Total Protein 6.3 (*)    All other components within normal limits  OCCULT BLOOD X 1 CARD TO LAB, STOOL - Abnormal; Notable for the following components:   Fecal Occult Bld POSITIVE (*)    All other components within normal limits  PROTIME-INR    EKG EKG Interpretation  Date/Time:  Wednesday July 31 2021 15:50:19 EDT Ventricular Rate:  113 PR Interval:  159 QRS Duration: 75 QT Interval:  306 QTC Calculation: 420 R Axis:   30 Text Interpretation: Sinus tachycardia Atrial premature complex Low voltage, precordial leads Confirmed by Nanda Quinton [phone]) on 07/31/2021 4:03:09 PM  Radiology CT ANGIO GI BLEED  Result Date: 07/31/2021 CLINICAL DATA:  Question diverticular bleed. EXAM: CTA ABDOMEN AND PELVIS WITHOUT AND  WITH CONTRAST TECHNIQUE: Multidetector CT imaging of the abdomen and pelvis was performed using the standard protocol during bolus administration of intravenous contrast. Multiplanar reconstructed images and MIPs were obtained and reviewed to  evaluate the vascular anatomy. CONTRAST:  174m OMNIPAQUE IOHEXOL 350 MG/ML SOLN COMPARISON:  None. FINDINGS: VASCULAR Aorta: Normal caliber aorta without aneurysm, dissection, vasculitis or significant stenosis. Celiac: Patent without evidence of aneurysm, dissection, vasculitis or significant stenosis. SMA: Patent without evidence of aneurysm, dissection, vasculitis or significant stenosis. Renals: Both renal arteries are patent without evidence of aneurysm, dissection, vasculitis, fibromuscular dysplasia or significant stenosis. IMA: Patent without evidence of aneurysm, dissection, vasculitis or significant stenosis. Inflow: Patent without evidence of aneurysm, dissection, vasculitis or significant stenosis. Proximal Outflow: Bilateral common femoral and visualized portions of the superficial and profunda femoral arteries are patent without evidence of aneurysm, dissection, vasculitis or significant stenosis. Veins: No obvious venous abnormality within the limitations of this arterial phase study. Review of the MIP images confirms the above findings. NON-VASCULAR Lower chest: No acute abnormality. Hepatobiliary: No focal liver abnormality is seen. No gallstones, gallbladder wall thickening, or biliary dilatation. Pancreas: There is a 6 mm hypodensity in the head of the pancreas image 6/51. The pancreas otherwise appears within normal limits. There is no surrounding inflammatory change or ductal dilatation. Spleen: Normal in size without focal abnormality. Adrenals/Urinary Tract: Adrenal glands are unremarkable. There is a rounded hypodensity in the right kidney which is too small to characterize, likely a cyst. Kidneys are otherwise normal, without renal calculi, focal lesion, or hydronephrosis. Bladder is unremarkable. Stomach/Bowel: There is no evidence for bowel obstruction. There is diffuse colonic diverticulosis. There is mild wall thickening and inflammation of the proximal sigmoid colon worrisome for acute  diverticulitis. There is no evidence for perforation or abscess. The appendix is within normal limits. No dilated bowel loops are identified. Portal venous images there is some small areas of likely wall enhancement in jejunal loops images 12/31 through 35. There is no definite luminal arterial bleeding seen throughout small bowel, colon or stomach. There is no free air. Lymphatic: No significant vascular findings are present. No enlarged abdominal or pelvic lymph nodes. Reproductive: Prostate is unremarkable. Other: There is a small umbilical hernia containing mesenteric fat and nondilated small bowel. There is no ascites. Musculoskeletal: No acute or significant osseous findings. IMPRESSION: VASCULAR 1. No definite intraluminal arterial bleeding within the stomach, small bowel or colon. There is some mild enhancement within the wall of jejunal loops on the portal venous phase only. This is of uncertain certain clinical significance. If there is high clinical concern for acute GI bleed consider repeat study as clinically warranted. NON-VASCULAR 1. Mild uncomplicated sigmoid colon diverticulitis. 2. 6 mm hypodense lesion in the head of the pancreas, unchanged. If patient is asymptomatic, recommend follow-up MRI in 12 months. Electronically Signed   By: ARonney AstersM.D.   On: 07/31/2021 18:12    Procedures Procedures   Medications Ordered in ED Medications  sodium chloride 0.9 % bolus 500 mL (has no administration in time range)  sodium chloride 0.9 % bolus 1,000 mL (1,000 mLs Intravenous New Bag/Given 07/31/21 1632)  iohexol (OMNIPAQUE) 350 MG/ML injection 100 mL (100 mLs Intravenous Contrast Given 07/31/21 1645)    ED Course  I have reviewed the triage vital signs and the nursing notes.  Pertinent labs & imaging results that were available during my care of the patient were reviewed by me and considered in my medical decision making (see chart for details).  MDM Rules/Calculators/A&P                          This is a 65 yo M with PMHx of diverticulosis who presents to the ED with diarrhea and bright red blood in stool. Pt endorses sudden onset hematochezia, painless defecation. On arrival, pt was tachycardic to 120s, BP 148/94, RR 20, 99% on RA. Rectal exam did show clots in stool, which was maroon-colored. Established IV access, obtained bloodwork and CT angiogram for further evalation. Differential includes diverticulitis versus colitis vs angiodysplasia.   4:00 PM: EKG showed sinus tachycardia. WBC 10.8, Hgb 12.1 from baseline 14-15. PT/INR 13.4, 1  5:15 PM: Rectal exam performed with chaperone showing some clots in stool, which is maroon-colored. No fissures, hemorrhoids, or other pathologies noted on exam. FOBT positive  6:15 PM: CT angio showed some inflammation of the proximal sigmoid colon suggestive of acute diverticulitis. Otherwise, no definite arterial bleeding was noted. Pt still tachycardic in the 110s after receiving a 1 L bolus, so will obtain orthostatics and give additional bolus (500 mL) and monitor his HR, recheck Hgb.   Final Clinical Impression(s) / ED Diagnoses Final diagnoses:  None    Rx / DC Orders ED Discharge Orders     None        Orvis Brill, MD 07/31/21 Nyra Jabs, MD 07/31/21 2218

## 2021-08-01 ENCOUNTER — Encounter (HOSPITAL_COMMUNITY): Payer: Self-pay | Admitting: Family Medicine

## 2021-08-01 DIAGNOSIS — K5732 Diverticulitis of large intestine without perforation or abscess without bleeding: Secondary | ICD-10-CM

## 2021-08-01 DIAGNOSIS — Z791 Long term (current) use of non-steroidal anti-inflammatories (NSAID): Secondary | ICD-10-CM | POA: Diagnosis not present

## 2021-08-01 DIAGNOSIS — E1142 Type 2 diabetes mellitus with diabetic polyneuropathy: Secondary | ICD-10-CM

## 2021-08-01 DIAGNOSIS — Z886 Allergy status to analgesic agent status: Secondary | ICD-10-CM | POA: Diagnosis not present

## 2021-08-01 DIAGNOSIS — K559 Vascular disorder of intestine, unspecified: Secondary | ICD-10-CM | POA: Diagnosis present

## 2021-08-01 DIAGNOSIS — K219 Gastro-esophageal reflux disease without esophagitis: Secondary | ICD-10-CM | POA: Diagnosis present

## 2021-08-01 DIAGNOSIS — F32A Depression, unspecified: Secondary | ICD-10-CM | POA: Diagnosis present

## 2021-08-01 DIAGNOSIS — K922 Gastrointestinal hemorrhage, unspecified: Secondary | ICD-10-CM | POA: Diagnosis not present

## 2021-08-01 DIAGNOSIS — Z91013 Allergy to seafood: Secondary | ICD-10-CM | POA: Diagnosis not present

## 2021-08-01 DIAGNOSIS — Z7984 Long term (current) use of oral hypoglycemic drugs: Secondary | ICD-10-CM | POA: Diagnosis not present

## 2021-08-01 DIAGNOSIS — I10 Essential (primary) hypertension: Secondary | ICD-10-CM | POA: Diagnosis present

## 2021-08-01 DIAGNOSIS — Z9841 Cataract extraction status, right eye: Secondary | ICD-10-CM | POA: Diagnosis not present

## 2021-08-01 DIAGNOSIS — Z8673 Personal history of transient ischemic attack (TIA), and cerebral infarction without residual deficits: Secondary | ICD-10-CM | POA: Diagnosis not present

## 2021-08-01 DIAGNOSIS — D62 Acute posthemorrhagic anemia: Secondary | ICD-10-CM | POA: Diagnosis present

## 2021-08-01 DIAGNOSIS — Z794 Long term (current) use of insulin: Secondary | ICD-10-CM

## 2021-08-01 DIAGNOSIS — Z7902 Long term (current) use of antithrombotics/antiplatelets: Secondary | ICD-10-CM | POA: Diagnosis not present

## 2021-08-01 DIAGNOSIS — K5733 Diverticulitis of large intestine without perforation or abscess with bleeding: Secondary | ICD-10-CM | POA: Diagnosis present

## 2021-08-01 DIAGNOSIS — Z801 Family history of malignant neoplasm of trachea, bronchus and lung: Secondary | ICD-10-CM | POA: Diagnosis not present

## 2021-08-01 DIAGNOSIS — Z87442 Personal history of urinary calculi: Secondary | ICD-10-CM | POA: Diagnosis not present

## 2021-08-01 DIAGNOSIS — K5792 Diverticulitis of intestine, part unspecified, without perforation or abscess without bleeding: Secondary | ICD-10-CM | POA: Diagnosis present

## 2021-08-01 DIAGNOSIS — Z833 Family history of diabetes mellitus: Secondary | ICD-10-CM | POA: Diagnosis not present

## 2021-08-01 DIAGNOSIS — N289 Disorder of kidney and ureter, unspecified: Secondary | ICD-10-CM | POA: Diagnosis present

## 2021-08-01 DIAGNOSIS — Z823 Family history of stroke: Secondary | ICD-10-CM | POA: Diagnosis not present

## 2021-08-01 DIAGNOSIS — Z20822 Contact with and (suspected) exposure to covid-19: Secondary | ICD-10-CM | POA: Diagnosis present

## 2021-08-01 DIAGNOSIS — G4733 Obstructive sleep apnea (adult) (pediatric): Secondary | ICD-10-CM | POA: Diagnosis present

## 2021-08-01 DIAGNOSIS — Z9842 Cataract extraction status, left eye: Secondary | ICD-10-CM | POA: Diagnosis not present

## 2021-08-01 DIAGNOSIS — Z8371 Family history of colonic polyps: Secondary | ICD-10-CM | POA: Diagnosis not present

## 2021-08-01 DIAGNOSIS — Z87891 Personal history of nicotine dependence: Secondary | ICD-10-CM | POA: Diagnosis not present

## 2021-08-01 LAB — CBC
HCT: 25.9 % — ABNORMAL LOW (ref 39.0–52.0)
HCT: 25 % — ABNORMAL LOW (ref 39.0–52.0)
HCT: 24 % — ABNORMAL LOW (ref 39.0–52.0)
Hemoglobin: 8.9 g/dL — ABNORMAL LOW (ref 13.0–17.0)
Hemoglobin: 8.6 g/dL — ABNORMAL LOW (ref 13.0–17.0)
Hemoglobin: 8.2 g/dL — ABNORMAL LOW (ref 13.0–17.0)
MCH: 32.5 pg (ref 26.0–34.0)
MCH: 32.3 pg (ref 26.0–34.0)
MCH: 32.3 pg (ref 26.0–34.0)
MCHC: 34.4 g/dL (ref 30.0–36.0)
MCHC: 34.4 g/dL (ref 30.0–36.0)
MCHC: 34.2 g/dL (ref 30.0–36.0)
MCV: 94.5 fL (ref 80.0–100.0)
MCV: 94 fL (ref 80.0–100.0)
MCV: 94.5 fL (ref 80.0–100.0)
Platelets: 228 10*3/uL (ref 150–400)
Platelets: 202 10*3/uL (ref 150–400)
Platelets: 204 10*3/uL (ref 150–400)
RBC: 2.74 MIL/uL — ABNORMAL LOW (ref 4.22–5.81)
RBC: 2.66 MIL/uL — ABNORMAL LOW (ref 4.22–5.81)
RBC: 2.54 MIL/uL — ABNORMAL LOW (ref 4.22–5.81)
RDW: 13.6 % (ref 11.5–15.5)
RDW: 13.6 % (ref 11.5–15.5)
RDW: 13.6 % (ref 11.5–15.5)
WBC: 7.1 10*3/uL (ref 4.0–10.5)
WBC: 6.8 10*3/uL (ref 4.0–10.5)
WBC: 5.8 10*3/uL (ref 4.0–10.5)
nRBC: 0 % (ref 0.0–0.2)
nRBC: 0 % (ref 0.0–0.2)
nRBC: 0 % (ref 0.0–0.2)

## 2021-08-01 LAB — GLUCOSE, CAPILLARY
Glucose-Capillary: 135 mg/dL — ABNORMAL HIGH (ref 70–99)
Glucose-Capillary: 116 mg/dL — ABNORMAL HIGH (ref 70–99)
Glucose-Capillary: 124 mg/dL — ABNORMAL HIGH (ref 70–99)

## 2021-08-01 LAB — COMPREHENSIVE METABOLIC PANEL
ALT: 21 U/L (ref 0–44)
AST: 17 U/L (ref 15–41)
Albumin: 3.4 g/dL — ABNORMAL LOW (ref 3.5–5.0)
Alkaline Phosphatase: 64 U/L (ref 38–126)
Anion gap: 11 (ref 5–15)
BUN: 18 mg/dL (ref 8–23)
CO2: 26 mmol/L (ref 22–32)
Calcium: 8.8 mg/dL — ABNORMAL LOW (ref 8.9–10.3)
Chloride: 101 mmol/L (ref 98–111)
Creatinine, Ser: 1.23 mg/dL (ref 0.61–1.24)
GFR, Estimated: >60 mL/min (ref >60)
Glucose, Bld: 115 mg/dL — ABNORMAL HIGH (ref 70–99)
Potassium: 4.6 mmol/L (ref 3.5–5.1)
Sodium: 138 mmol/L (ref 135–145)
Total Bilirubin: 0.6 mg/dL (ref 0.3–1.2)
Total Protein: 5.8 g/dL — ABNORMAL LOW (ref 6.5–8.1)

## 2021-08-01 LAB — HEMOGLOBIN AND HEMATOCRIT, BLOOD
HCT: 26.9 % — ABNORMAL LOW (ref 39.0–52.0)
Hemoglobin: 9.4 g/dL — ABNORMAL LOW (ref 13.0–17.0)

## 2021-08-01 LAB — APTT: aPTT: 25 seconds (ref 24–36)

## 2021-08-01 LAB — HEMOGLOBIN A1C
Hgb A1c MFr Bld: 5.8 % — ABNORMAL HIGH (ref 4.8–5.6)
Mean Plasma Glucose: 119.76 mg/dL

## 2021-08-01 LAB — PROTIME-INR
INR: 1.1 (ref 0.8–1.2)
Prothrombin Time: 13.7 seconds (ref 11.4–15.2)

## 2021-08-01 LAB — MRSA NEXT GEN BY PCR, NASAL: MRSA by PCR Next Gen: NOT DETECTED

## 2021-08-01 LAB — ABO/RH: ABO/RH(D): O NEG

## 2021-08-01 LAB — HIV ANTIBODY (ROUTINE TESTING W REFLEX): HIV Screen 4th Generation wRfx: NONREACTIVE

## 2021-08-01 LAB — MAGNESIUM: Magnesium: 1.6 mg/dL — ABNORMAL LOW (ref 1.7–2.4)

## 2021-08-01 MED ORDER — INSULIN ASPART 100 UNIT/ML IJ SOLN
0.0000 [IU] | Freq: Three times a day (TID) | INTRAMUSCULAR | Status: DC
  Administered 2021-08-01 – 2021-08-03 (×4): 2 [IU] via SUBCUTANEOUS

## 2021-08-01 MED ORDER — MORPHINE SULFATE (PF) 4 MG/ML IV SOLN: 4.0000 mg | INTRAVENOUS | Status: DC | PRN

## 2021-08-01 MED ORDER — ONDANSETRON HCL 4 MG/2ML IJ SOLN
4.0000 mg | Freq: Four times a day (QID) | INTRAMUSCULAR | Status: DC | PRN
  Administered 2021-08-01: 4 mg via INTRAVENOUS
  Filled 2021-08-01: qty 2

## 2021-08-01 MED ORDER — GABAPENTIN 300 MG PO CAPS
300.0000 mg | ORAL_CAPSULE | Freq: Every day | ORAL | Status: DC
  Administered 2021-08-01 – 2021-08-02 (×2): 300 mg via ORAL
  Filled 2021-08-01 (×2): qty 1

## 2021-08-01 MED ORDER — PANTOPRAZOLE SODIUM 40 MG IV SOLR
40.0000 mg | Freq: Two times a day (BID) | INTRAVENOUS | Status: DC
  Administered 2021-08-01 – 2021-08-03 (×5): 40 mg via INTRAVENOUS
  Filled 2021-08-01 (×5): qty 40

## 2021-08-01 MED ORDER — POLYETHYLENE GLYCOL 3350 17 G PO PACK: 17.0000 g | PACK | Freq: Every day | ORAL | Status: DC | PRN

## 2021-08-01 MED ORDER — LATANOPROST 0.005 % OP SOLN
1.0000 [drp] | Freq: Every day | OPHTHALMIC | Status: DC
  Filled 2021-08-01: qty 2.5

## 2021-08-01 MED ORDER — GABAPENTIN 300 MG PO CAPS
300.0000 mg | ORAL_CAPSULE | Freq: Every day | ORAL | Status: DC
  Filled 2021-08-01: qty 1

## 2021-08-01 MED ORDER — ONDANSETRON HCL 4 MG PO TABS: 4.0000 mg | ORAL_TABLET | Freq: Four times a day (QID) | ORAL | Status: DC | PRN

## 2021-08-01 MED ORDER — ACETAMINOPHEN 650 MG RE SUPP: 650.0000 mg | Freq: Four times a day (QID) | RECTAL | Status: DC | PRN

## 2021-08-01 MED ORDER — MAGNESIUM SULFATE 2 GM/50ML IV SOLN
2.0000 g | Freq: Once | INTRAVENOUS | Status: AC
  Administered 2021-08-01: 2 g via INTRAVENOUS
  Filled 2021-08-01: qty 50

## 2021-08-01 MED ORDER — ONDANSETRON HCL 4 MG/2ML IJ SOLN
4.0000 mg | Freq: Once | INTRAMUSCULAR | Status: AC
  Administered 2021-08-01: 4 mg via INTRAVENOUS
  Filled 2021-08-01: qty 2

## 2021-08-01 MED ORDER — ACETAMINOPHEN 325 MG PO TABS: 650.0000 mg | ORAL_TABLET | Freq: Four times a day (QID) | ORAL | Status: DC | PRN

## 2021-08-01 MED ORDER — ORAL CARE MOUTH RINSE
15.0000 mL | Freq: Two times a day (BID) | OROMUCOSAL | Status: DC
  Administered 2021-08-01 – 2021-08-02 (×2): 15 mL via OROMUCOSAL

## 2021-08-01 MED ORDER — MORPHINE SULFATE (PF) 2 MG/ML IV SOLN: 2.0000 mg | INTRAVENOUS | Status: DC | PRN

## 2021-08-01 MED ORDER — SERTRALINE HCL 100 MG PO TABS
100.0000 mg | ORAL_TABLET | Freq: Every day | ORAL | Status: DC
  Administered 2021-08-01 – 2021-08-03 (×2): 100 mg via ORAL
  Filled 2021-08-01: qty 1
  Filled 2021-08-01: qty 2

## 2021-08-01 MED ORDER — CHLORHEXIDINE GLUCONATE CLOTH 2 % EX PADS
6.0000 | MEDICATED_PAD | Freq: Every day | CUTANEOUS | Status: DC
  Administered 2021-08-01 – 2021-08-02 (×2): 6 via TOPICAL

## 2021-08-01 MED ORDER — PIPERACILLIN-TAZOBACTAM 3.375 G IVPB
3.3750 g | Freq: Three times a day (TID) | INTRAVENOUS | Status: DC
  Administered 2021-08-01 – 2021-08-02 (×4): 3.375 g via INTRAVENOUS
  Filled 2021-08-01 (×4): qty 50

## 2021-08-01 MED ORDER — MELATONIN 5 MG PO TABS: 10.0000 mg | ORAL_TABLET | Freq: Every evening | ORAL | Status: DC | PRN

## 2021-08-01 MED ORDER — DORZOLAMIDE HCL 2 % OP SOLN
1.0000 [drp] | Freq: Two times a day (BID) | OPHTHALMIC | Status: DC
  Filled 2021-08-01: qty 10

## 2021-08-01 MED ORDER — FENTANYL CITRATE PF 50 MCG/ML IJ SOSY
50.0000 ug | PREFILLED_SYRINGE | Freq: Once | INTRAMUSCULAR | Status: AC
Start: 2021-08-01 — End: 2021-08-01
  Administered 2021-08-01: 50 ug via INTRAVENOUS
  Filled 2021-08-01: qty 1

## 2021-08-01 NOTE — ED Notes (Signed)
ED Provider at bedside. Explanation to pt. And wife regarding POC to transfer to [facility] ED due to continued active GI bleed and decreased Hgb.   VSS. NAD

## 2021-08-01 NOTE — Consult Note (Addendum)
Referring Provider:  [facility]         Primary Care Physician:  Kelton Pillar, MD Primary Gastroenterologist:  Wilfrid Lund, MD   Reason for Consultation:   GI bleed                Attending physician's note   I have taken a history, examined the patient and reviewed the chart. I agree with the Advanced Practitioner's note, impression and recommendations.  65 year old admitted with diarrhea and hematochezia.  He has abdominal discomfort/cramping.  On abdominal exam has mild left lower quadrant tenderness  CTA showed sigmoid thickening Ischemic colitis is in the differential but will need to exclude brisk upper GI bleed from possible gastroduodenal ulcer given he has history of chronic NSAID use.    The risks and benefits as well as alternatives of endoscopic procedure(s) have been discussed and reviewed. All questions answered. The patient agrees to proceed.   Plavix on hold  Monitor H&H and transfuse as needed Clear liquid diet N.p.o. after 7 a.m. Pantoprazole IV twice daily  The patient was provided an opportunity to ask questions and all were answered. The patient agreed with the plan and demonstrated an understanding of the instructions.  Damaris Hippo , MD [phone]     ASSESSMENT / PLAN    # 65 yo male with gastrointestinal bleeding / abdominal cramping. Ischemic colitis is a consideration in setting of pseudoephedrine type medication and possible volume depletion. CTA negative for definite luminal arterial bleeding but imaging does suggest mild uncomplicated diverticulitis.   A brisk upper GI bleed from PUD is not excluded given tachycardia and frequent NSAID. Small bowel ischemia less likely source of bleed but CTA shows mild enhancement within the wall of jejunal loops (on the portal venous phase only)  --Schedule for EGD to exclude a high risk upper GI lesion. The risks and benefits of EGD with possible biopsies were discussed with the patient who  agrees to proceed. Therapeutic intervention may be limited during the procedure since he will be only three days out from last Plavix dose.   --Currently getting Zosyn for potential sigmoid diverticulitis as mentioned above.  Since patients generally don't bleed with diverticulitis ( might findings on CTA represent ischemic colitis). .   # Hx of CVA. Last dose of Plavix was evening of 9/6.   # 6 mm hypodense lesion in the head of the pancreas, unchanged. Can follow up outpatient   # GERD, maintained on Prevacid at home.    HISTORY OF PRESENT ILLNESS                                                                                                                         Chief Complaint:  gastrointestinal bleeding  Adam Quinn is a 65 y.o. male with a past medical history significant for colon polyps, iron deficiency anemia followed by Hematology, CVA, DM, glaucoma,  OSA on CPAP. See PMH for any additional medical history.  ED course:  Patient came to ED yesterday with rectal bleeding. He noticed red blood in stool yesterday around lunch time. He had a few BMs prior but didn't check for blood.  No significant abdominal pain with,  or prior to the onset of bleeding, just mild cramping resolved with defecation. No nausea or vomiting.  He occasionally has constipation but hasn't previously seen any blood. He was very tachycardic on arrival to ED. Hgb was 12.1 (baseline 14.5). Hgb dropped 2 more grams to 10.4 on on repeat labs a few hours later. BUN normal. Mildly elevated creatinine of 1.28.   Overnight patient's hgb has declined further to 8.9. Heart rate better now around 104 but it increases to ~ 160's when upright ( per wife who is a Marine scientist). He is normotensive. No chest pain nor SOB. Last BM was sometimes before 3 am and per wife it was more maroon in color. He notes that his stomach was making loud noises early this am but that has resolved.   Adam Quinn takes a few doses of Advil 3-4 nights a  week for sinus problems. He is on Plavix, last dose was evening of 9/6. Of note he also take Alavert-D. He has a history of GERD, takes Prevacid at home.   Last polyp surveillance colonoscopy was in 2020. Diverticulosis found but otherwise the bowel prep was poor. Plan was to repeat colonoscopy 6 months later but it has not yet been Quinn. No [place] of colon cancer.      DIAGNOSTIC STUDIES:   CTA 07/31/21 IMPRESSION: VASCULAR   1. No definite intraluminal arterial bleeding within the stomach, small bowel or colon. There is some mild enhancement within the wall of jejunal loops on the portal venous phase only. This is of uncertain certain clinical significance. If there is high clinical concern for acute GI bleed consider repeat study as clinically warranted.   NON-VASCULAR   1. Mild uncomplicated sigmoid colon diverticulitis. 2. 6 mm hypodense lesion in the head of the pancreas, unchanged. If patient is asymptomatic, recommend follow-up MRI in 12 months.  PREVIOUS ENDOSCOPIC EVALUATIONS    June 2020 polyp surveillance colonoscopy  --There was stool in entire colon to the cecum. Diverticulosis. Recommended repeat colonoscopy in 6 months.   July 2016 Colonoscopy  --Complete exam, good prep. Diverticulosis. A 15 mm semi-pedunculated cecal polyp was removed but not retrieved.   June 2016 EGD for IDA --gastritis    Past Medical History:  Diagnosis Date   Allergy    seasonal   Anxiety    Cataract    cataract surgery bilaterally resolved issues   Central scotoma    Chronic kidney disease    small protien showing uses Lisinopril for this   Diverticulitis    Diverticulosis    DM (diabetes mellitus) ([place])    GERD (gastroesophageal reflux disease)    Glaucoma    History of colon polyps    History of hiatal hernia    History of kidney stones    x1   Iron deficiency anemia    Iron infusion 04/29/2019   Neuropathy    OSA on CPAP    Stroke ([place])    Wears glasses    READING     Past Surgical History:  Procedure Laterality Date   cataract surgery     bilateral   COLONOSCOPY     COLONOSCOPY N/A 06/04/2015   Procedure: COLONOSCOPY;  Surgeon: Inda Castle, MD;  Location: [facility] ENDOSCOPY;  Service: Endoscopy;  Laterality: N/A;  COLONOSCOPY WITH PROPOFOL N/A 05/09/2019   Procedure: COLONOSCOPY WITH PROPOFOL;  Surgeon: Doran Stabler, MD;  Location: [facility] ENDOSCOPY;  Service: Gastroenterology;  Laterality: N/A;   ESOPHAGOGASTRODUODENOSCOPY N/A 06/04/2015   Procedure: ESOPHAGOGASTRODUODENOSCOPY (EGD);  Surgeon: Inda Castle, MD;  Location: [facility];  Service: Endoscopy;  Laterality: N/A;   UPPER GASTROINTESTINAL ENDOSCOPY     WISDOM TOOTH EXTRACTION     with sedation    Prior to Admission medications   Medication Sig Start Date End Date Taking? Authorizing Provider  clopidogrel (PLAVIX) 75 MG tablet Take 1 tablet (75 mg total) by mouth daily. 06/19/21  Yes Frann Rider, NP  Continuous Blood Gluc Receiver (FREESTYLE LIBRE 2 READER) DEVI USE AS DIRECTED FOR CONTINUOUS BLOOD SUGAR MONITORING 12/19/20 12/19/21 Yes Kelton Pillar, MD  dorzolamide (TRUSOPT) 2 % ophthalmic solution INSTILL 1 DROP IN BOTH EYES TWICE A DAY 05/16/21  Yes   Dulaglutide (TRULICITY) 1.5 0000000 SOPN Inject under the skin once a week Patient taking differently: Inject 1.5 mg into the skin once a week. Friday 04/02/21  Yes   gabapentin (NEURONTIN) 300 MG capsule TAKE 1 CAPSULE BY MOUTH ONCE DAILY 02/22/21 02/22/22 Yes Kelton Pillar, MD  ibuprofen (ADVIL,MOTRIN) 800 MG tablet Take 800 mg by mouth every 8 (eight) hours as needed.   Yes [provider]  lansoprazole (PREVACID) 30 MG capsule TAKE 1 CAPSULE BY MOUTH TWICE DAILY 02/22/21 02/22/22 Yes Kelton Pillar, MD  loratadine-pseudoephedrine (CLARITIN-D 12-HOUR) 5-120 MG tablet Take 1 tablet by mouth at bedtime as needed for allergies. Alavert-D   Yes [provider]  LUMIGAN 0.01 % SOLN INSTILL 1 DROP INTO BOTH EYES AT BEDTIME  05/16/21  Yes   Melatonin 10 MG TABS Take 10 mg by mouth at bedtime.   Yes [provider]  metFORMIN (GLUCOPHAGE-XR) 500 MG 24 hr tablet TAKE 4 TABLETS BY MOUTH EVERY NIGHT AT BEDTIME Patient taking differently: Take 1,000 mg by mouth 2 (two) times daily. 02/22/21 02/22/22 Yes Kelton Pillar, MD  Probiotic Product (RA PROBIOTIC GUMMIES PO) Take 1 capsule by mouth at bedtime.   Yes [provider]  sertraline (ZOLOFT) 100 MG tablet Take 1 tablet by mouth once daily 07/05/21  Yes   sodium bicarbonate 650 MG tablet Take 1 tablet by mouth twice a day 90 days 06/27/21  Yes   TRESIBA FLEXTOUCH 200 UNIT/ML FlexTouch Pen Inject 0-30 Units into the skin as needed. Based on Sliding Scale patient has devised. Will Give insulin if BG is reading 150 or more 02/23/20  Yes [provider]  ondansetron (ZOFRAN ODT) 4 MG disintegrating tablet Take 1 tablet (4 mg total) by mouth every 8 (eight) hours as needed for nausea or vomiting. Patient not taking: Reported on 08/01/2021 09/01/20   Arlean Hopping C, PA-C  sodium bicarbonate 650 MG tablet TAKE 1 TABLET BY MOUTH TWICE DAILY Patient not taking: Reported on 08/01/2021 12/26/20 12/26/21  Kelton Pillar, MD    Current Facility-Administered Medications  Medication Dose Route Frequency Provider Last Rate Last Admin   0.9 %  sodium chloride infusion   Intravenous PRN Vernelle Emerald, MD 10 mL/hr at 08/01/21 0858 250 mL at 08/01/21 0858   acetaminophen (TYLENOL) tablet 650 mg  650 mg Oral Q6H PRN Vernelle Emerald, MD       Or   acetaminophen (TYLENOL) suppository 650 mg  650 mg Rectal Q6H PRN Shalhoub, Sherryll Burger, MD       Chlorhexidine Gluconate Cloth 2 % PADS 6 each  6  each Topical Daily Dessa Phi, DO   6 each at 08/01/21 0901   dorzolamide (TRUSOPT) 2 % ophthalmic solution 1 drop  1 drop Both Eyes BID Shalhoub, Sherryll Burger, MD       gabapentin (NEURONTIN) capsule 300 mg  300 mg Oral QHS Dessa Phi, DO       insulin aspart (novoLOG) injection 0-15  Units  0-15 Units Subcutaneous TID AC & HS Shalhoub, Sherryll Burger, MD   2 Units at 08/01/21 1150   lactated ringers infusion   Intravenous Continuous Shalhoub, Sherryll Burger, MD 125 mL/hr at 08/01/21 0852 New Bag at 08/01/21 0852   latanoprost (XALATAN) 0.005 % ophthalmic solution 1 drop  1 drop Both Eyes QHS Shalhoub, Sherryll Burger, MD       magnesium sulfate IVPB 2 g 50 mL  2 g Intravenous Once Dessa Phi, DO 50 mL/hr at 08/01/21 1153 2 g at 08/01/21 1153   MEDLINE mouth rinse  15 mL Mouth Rinse BID Dessa Phi, DO       melatonin tablet 10 mg  10 mg Oral QHS PRN Shalhoub, Sherryll Burger, MD       morphine 2 MG/ML injection 2 mg  2 mg Intravenous Q4H PRN Shalhoub, Sherryll Burger, MD       Or   morphine 4 MG/ML injection 4 mg  4 mg Intravenous Q4H PRN Shalhoub, Sherryll Burger, MD       ondansetron [facility]) tablet 4 mg  4 mg Oral Q6H PRN Vernelle Emerald, MD       Or   ondansetron [facility]) injection 4 mg  4 mg Intravenous Q6H PRN Shalhoub, Sherryll Burger, MD   4 mg at 08/01/21 0936   pantoprazole (PROTONIX) injection 40 mg  40 mg Intravenous Q12H Vernelle Emerald, MD   40 mg at 08/01/21 0903   piperacillin-tazobactam (ZOSYN) IVPB 3.375 g  3.375 g Intravenous Q8H Dimple Nanas, RPH 12.5 mL/hr at 08/01/21 0900 3.375 g at 08/01/21 0900   sertraline (ZOLOFT) tablet 100 mg  100 mg Oral Daily Vernelle Emerald, MD   100 mg at 08/01/21 0901    Allergies as of 07/31/2021 - Review Complete 07/31/2021  Allergen Reaction Noted   Aspirin Hives 03/16/2008   Shrimp [shellfish allergy] Hives 05/21/2015    Family History  Problem Relation Age of Onset   Colon polyps Father    Lung cancer Father    Diabetes Mother    Dementia Mother    Diabetes Sister        x 2   Stroke Maternal Grandmother    Stroke Paternal Grandfather    Colon cancer Neg Hx    Rectal cancer Neg Hx    Stomach cancer Neg Hx    Pancreatic cancer Neg Hx     Social History   Socioeconomic History   Marital status: Married    Spouse name: Not on  file   Number of children: 3   Years of education: Not on file   Highest education level: Not on file  Occupational History   Occupation: Optometrist  Tobacco Use   Smoking status: Former    Types: Cigarettes    Quit date: 10/04/1997    Years since quitting: 23.8   Smokeless tobacco: Never  Vaping Use   Vaping Use: Never used  Substance and Sexual Activity   Alcohol use: No   Drug use: No   Sexual activity: Not on file  Other Topics Concern   Not on file  Social  History Narrative   Not on file   Social Determinants of Health   Financial Resource Strain: Not on file  Food Insecurity: No Food Insecurity   Worried About Charity fundraiser in the Last Year: Never true   Ran Out of Food in the Last Year: Never true  Transportation Needs: No Transportation Needs   Lack of Transportation (Medical): No   Lack of Transportation (Non-Medical): No  Physical Activity: Not on file  Stress: Not on file  Social Connections: Not on file  Intimate Partner Violence: Not on file    Review of Systems: All systems reviewed and negative except where noted in HPI.  OBJECTIVE    Physical Exam: Vital signs in last 24 hours: Temp:  [98 F (36.7 C)-98.5 F (36.9 C)] 98.2 F (36.8 C) (09/08 1200) Pulse Rate:  [92-126] 101 (09/08 1200) Resp:  [10-21] 10 (09/08 1200) BP: (118-164)/(71-95) 138/81 (09/08 1200) SpO2:  [96 %-100 %] 100 % (09/08 1200) Weight:  [77.1 kg-80 kg] 80 kg (09/07 2147) Last BM Date: 08/01/21 General:   Alert  male in NAD Psych:  Pleasant, cooperative. Normal mood and affect. Eyes:  Pupils equal, sclera clear, no icterus.   Conjunctiva pink. Ears:  Normal auditory acuity. Nose:  No deformity, discharge,  or lesions. Neck:  Supple; no masses Lungs:  Clear throughout to auscultation.   No wheezes, crackles, or rhonchi.  Heart:  Slightly tachycardic , no lower extremity edema Abdomen:  Soft, non-distended, mild left mid abdominal tenderness.  BS active, no palp mass    Rectal:  Deferred  Msk:  Symmetrical without gross deformities. . Neurologic:  Alert and  oriented x4;  grossly normal neurologically. Skin:  Intact without significant lesions or rashes.   Scheduled inpatient medications  Chlorhexidine Gluconate Cloth  6 each Topical Daily   dorzolamide  1 drop Both Eyes BID   gabapentin  300 mg Oral QHS   insulin aspart  0-15 Units Subcutaneous TID AC & HS   latanoprost  1 drop Both Eyes QHS   mouth rinse  15 mL Mouth Rinse BID   pantoprazole (PROTONIX) IV  40 mg Intravenous Q12H   sertraline  100 mg Oral Daily      Intake/Output from previous day: 09/07 0701 - 09/08 0700 In: 1890 [P.O.:240; IV Piggyback:1650] Out: 600 [Urine:600] Intake/Output this shift: Total I/O In: -  Out: 325 [Urine:325]   Lab Results: Recent Labs    07/31/21 1552 07/31/21 1939 08/01/21 0040 08/01/21 0828  WBC 10.8*  --   --  7.1  HGB 12.1* 10.4* 9.4* 8.9*  HCT 35.8* 29.9* 26.9* 25.9*  PLT 285  --   --  228   BMET Recent Labs    07/31/21 1552 08/01/21 0828  NA 138 138  K 4.5 4.6  CL 104 101  CO2 25 26  GLUCOSE 121* 115*  BUN 20 18  CREATININE 1.28* 1.23  CALCIUM 9.0 8.8*   LFT Recent Labs    08/01/21 0828  PROT 5.8*  ALBUMIN 3.4*  AST 17  ALT 21  ALKPHOS 64  BILITOT 0.6   PT/INR Recent Labs    07/31/21 1552 08/01/21 0828  LABPROT 13.4 13.7  INR 1.0 1.1   Hepatitis Panel No results for input(s): HEPBSAG, HCVAB, HEPAIGM, HEPBIGM in the last 72 hours.   . CBC Latest Ref Rng & Units 08/01/2021 08/01/2021 07/31/2021  WBC 4.0 - 10.5 K/uL 7.1 - -  Hemoglobin 13.0 - 17.0 g/dL 8.9(L) 9.4(L) 10.4(L)  Hematocrit 39.0 - 52.0 % 25.9(L) 26.9(L) 29.9(L)  Platelets 150 - 400 K/uL 228 - -    . CMP Latest Ref Rng & Units 08/01/2021 07/31/2021 01/03/2021  Glucose 70 - 99 mg/dL 115(H) 121(H) 129(H)  BUN 8 - 23 mg/dL '18 20 12  '$ Creatinine 0.61 - 1.24 mg/dL 1.23 1.28(H) 1.20  Sodium 135 - 145 mmol/L 138 138 142  Potassium 3.5 - 5.1 mmol/L 4.6 4.5  4.9  Chloride 98 - 111 mmol/L 101 104 105  CO2 22 - 32 mmol/L '26 25 29  '$ Calcium 8.9 - 10.3 mg/dL 8.8(L) 9.0 10.1  Total Protein 6.5 - 8.1 g/dL 5.8(L) 6.3(L) 6.4(L)  Total Bilirubin 0.3 - 1.2 mg/dL 0.6 0.3 0.3  Alkaline Phos 38 - 126 U/L 64 75 81  AST 15 - 41 U/L 17 22 11(L)  ALT 0 - 44 U/L '21 23 11   '$ Studies/Results: CT ANGIO GI BLEED  Result Date: 07/31/2021 CLINICAL DATA:  Question diverticular bleed. EXAM: CTA ABDOMEN AND PELVIS WITHOUT AND WITH CONTRAST TECHNIQUE: Multidetector CT imaging of the abdomen and pelvis was performed using the standard protocol during bolus administration of intravenous contrast. Multiplanar reconstructed images and MIPs were obtained and reviewed to evaluate the vascular anatomy. CONTRAST:  119m OMNIPAQUE IOHEXOL 350 MG/ML SOLN COMPARISON:  None. FINDINGS: VASCULAR Aorta: Normal caliber aorta without aneurysm, dissection, vasculitis or significant stenosis. Celiac: Patent without evidence of aneurysm, dissection, vasculitis or significant stenosis. SMA: Patent without evidence of aneurysm, dissection, vasculitis or significant stenosis. Renals: Both renal arteries are patent without evidence of aneurysm, dissection, vasculitis, fibromuscular dysplasia or significant stenosis. IMA: Patent without evidence of aneurysm, dissection, vasculitis or significant stenosis. Inflow: Patent without evidence of aneurysm, dissection, vasculitis or significant stenosis. Proximal Outflow: Bilateral common femoral and visualized portions of the superficial and profunda femoral arteries are patent without evidence of aneurysm, dissection, vasculitis or significant stenosis. Veins: No obvious venous abnormality within the limitations of this arterial phase study. Review of the MIP images confirms the above findings. NON-VASCULAR Lower chest: No acute abnormality. Hepatobiliary: No focal liver abnormality is seen. No gallstones, gallbladder wall thickening, or biliary dilatation. Pancreas:  There is a 6 mm hypodensity in the head of the pancreas image 6/51. The pancreas otherwise appears within normal limits. There is no surrounding inflammatory change or ductal dilatation. Spleen: Normal in size without focal abnormality. Adrenals/Urinary Tract: Adrenal glands are unremarkable. There is a rounded hypodensity in the right kidney which is too small to characterize, likely a cyst. Kidneys are otherwise normal, without renal calculi, focal lesion, or hydronephrosis. Bladder is unremarkable. Stomach/Bowel: There is no evidence for bowel obstruction. There is diffuse colonic diverticulosis. There is mild wall thickening and inflammation of the proximal sigmoid colon worrisome for acute diverticulitis. There is no evidence for perforation or abscess. The appendix is within normal limits. No dilated bowel loops are identified. Portal venous images there is some small areas of likely wall enhancement in jejunal loops images 12/31 through 35. There is no definite luminal arterial bleeding seen throughout small bowel, colon or stomach. There is no free air. Lymphatic: No significant vascular findings are present. No enlarged abdominal or pelvic lymph nodes. Reproductive: Prostate is unremarkable. Other: There is a small umbilical hernia containing mesenteric fat and nondilated small bowel. There is no ascites. Musculoskeletal: No acute or significant osseous findings. IMPRESSION: VASCULAR 1. No definite intraluminal arterial bleeding within the stomach, small bowel or colon. There is some mild enhancement within the wall of jejunal loops on  the portal venous phase only. This is of uncertain certain clinical significance. If there is high clinical concern for acute GI bleed consider repeat study as clinically warranted. NON-VASCULAR 1. Mild uncomplicated sigmoid colon diverticulitis. 2. 6 mm hypodense lesion in the head of the pancreas, unchanged. If patient is asymptomatic, recommend follow-up MRI in 12 months.  Electronically Signed   By: Ronney Asters M.D.   On: 07/31/2021 18:12    Principal Problem:   Acute lower gastrointestinal bleeding Active Problems:   Type 2 diabetes mellitus with diabetic polyneuropathy, with long-term current use of insulin ([place])   GERD without esophagitis   Sigmoid diverticulitis   Diabetic polyneuropathy associated with type 2 diabetes mellitus ([place])    Tye Savoy, NP-C @  08/01/2021, 12:11 PM

## 2021-08-01 NOTE — H&P (Signed)
History and Physical    Adam Quinn [id number] DOB: [birth year] DOA: 07/31/2021  PCP: Kelton Pillar, MD  Patient coming from: Home via [facility]   Chief Complaint:  Chief Complaint  Patient presents with   Rectal Bleeding     HPI:    65 year old male with past medical history of diabetes mellitus type 2, diverticulosis (last colonoscopy 04/2019), gastroesophageal reflux disease, stroke (2017, 2020) who presents to [facility] hospital emergency department as a transfer from [facility] emergency department after the patient began to experience abdominal pain and rectal bleeding.  Patient explains that the morning of 9/7 he woke up from sleep with crampy lower abdominal pain.  Patient describes pain as 10 out of 10 in intensity, located just inferior to the umbilicus and severe in intensity.  Pain is nonradiating and was associated with frequent bouts of loose stools.  Patient planes of associated nausea and one episode of nonbilious nonbloody vomiting.  Patient denies any associated fevers, recent antibiotic use or recent ingestion of undercooked food.  Patient continued to experience loose stools throughout the morning until he began to experience hematochezia.  Patient experienced multiple episodes of dark red blood per rectum on at least 4 occasions throughout the remainder the morning in the early afternoon.  Due to the combination of patient's worsening severe crampy lower abdominal pain and frequent bouts of rectal bleeding the patient decided to present to [place] emergency department for evaluation.  Upon evaluation in the emergency department, patient's initial hemoglobin was found to be 12.1, down from 14.5 several months prior.  Serial CBCs throughout the evening revealed a downtrending hemoglobin down to 9.4.  CT angiogram of the abdomen and pelvis was performed and revealed no evidence of active bleeding but did reveal sigmoid diverticulitis.  Patient  initiated on intravenous Zosyn.  Due to concerns for ongoing bleeding and possible need for blood transfusion patient was sent as an ED to ED transfer to [facility] emergency department for further evaluation with hospitalist group is agreed to evaluate the patient for admission to the hospital.  Review of Systems:   Review of Systems  Gastrointestinal:  Positive for abdominal pain, blood in stool, nausea and vomiting.  All other systems reviewed and are negative.  Past Medical History:  Diagnosis Date   Allergy    seasonal   Anxiety    Cataract    cataract surgery bilaterally resolved issues   Central scotoma    Chronic kidney disease    small protien showing uses Lisinopril for this   Diverticulitis    Diverticulosis    DM (diabetes mellitus) ([place])    GERD (gastroesophageal reflux disease)    Glaucoma    History of colon polyps    History of hiatal hernia    History of kidney stones    x1   Iron deficiency anemia    Iron infusion 04/29/2019   Neuropathy    OSA on CPAP    Stroke ([place])    Wears glasses    READING    Past Surgical History:  Procedure Laterality Date   cataract surgery     bilateral   COLONOSCOPY     COLONOSCOPY N/A 06/04/2015   Procedure: COLONOSCOPY;  Surgeon: Inda Castle, MD;  Location: [facility] ENDOSCOPY;  Service: Endoscopy;  Laterality: N/A;   COLONOSCOPY WITH PROPOFOL N/A 05/09/2019   Procedure: COLONOSCOPY WITH PROPOFOL;  Surgeon: Doran Stabler, MD;  Location: [facility] ENDOSCOPY;  Service: Gastroenterology;  Laterality: N/A;   ESOPHAGOGASTRODUODENOSCOPY N/A 06/04/2015   Procedure: ESOPHAGOGASTRODUODENOSCOPY (EGD);  Surgeon: Inda Castle, MD;  Location: [facility];  Service: Endoscopy;  Laterality: N/A;   UPPER GASTROINTESTINAL ENDOSCOPY     WISDOM TOOTH EXTRACTION     with sedation     reports that he quit smoking about 23 years ago. He has never used smokeless tobacco. He reports that he does not drink alcohol and does not use  drugs.  Allergies  Allergen Reactions   Aspirin Anaphylaxis and Hives   Shrimp [Shellfish Allergy] Hives    Just shrimp     Family History  Problem Relation Age of Onset   Colon polyps Father    Lung cancer Father    Diabetes Mother    Dementia Mother    Diabetes Sister        x 2   Stroke Maternal Grandmother    Stroke Paternal Grandfather    Colon cancer Neg Hx    Rectal cancer Neg Hx    Stomach cancer Neg Hx    Pancreatic cancer Neg Hx      Prior to Admission medications   Medication Sig Start Date End Date Taking? Authorizing Provider  clopidogrel (PLAVIX) 75 MG tablet Take 1 tablet (75 mg total) by mouth daily. 06/19/21  Yes Frann Rider, NP  Continuous Blood Gluc Receiver (FREESTYLE LIBRE 2 READER) DEVI USE AS DIRECTED FOR CONTINUOUS BLOOD SUGAR MONITORING 12/19/20 12/19/21 Yes Kelton Pillar, MD  dorzolamide (TRUSOPT) 2 % ophthalmic solution INSTILL 1 DROP IN BOTH EYES TWICE A DAY 05/16/21  Yes   Dulaglutide (TRULICITY) 1.5 0000000 SOPN Inject under the skin once a week Patient taking differently: Inject 1.5 mg into the skin once a week. Friday 04/02/21  Yes   gabapentin (NEURONTIN) 300 MG capsule TAKE 1 CAPSULE BY MOUTH ONCE DAILY 02/22/21 02/22/22 Yes Kelton Pillar, MD  ibuprofen (ADVIL,MOTRIN) 800 MG tablet Take 800 mg by mouth every 8 (eight) hours as needed.   Yes [provider]  lansoprazole (PREVACID) 30 MG capsule TAKE 1 CAPSULE BY MOUTH TWICE DAILY 02/22/21 02/22/22 Yes Kelton Pillar, MD  loratadine-pseudoephedrine (CLARITIN-D 12-HOUR) 5-120 MG tablet Take 1 tablet by mouth at bedtime as needed for allergies. Alavert-D   Yes [provider]  LUMIGAN 0.01 % SOLN INSTILL 1 DROP INTO BOTH EYES AT BEDTIME 05/16/21  Yes   Melatonin 10 MG TABS Take 10 mg by mouth at bedtime.   Yes [provider]  metFORMIN (GLUCOPHAGE-XR) 500 MG 24 hr tablet TAKE 4 TABLETS BY MOUTH EVERY NIGHT AT BEDTIME Patient taking differently: Take 1,000 mg by mouth 2  (two) times daily. 02/22/21 02/22/22 Yes Kelton Pillar, MD  Probiotic Product (RA PROBIOTIC GUMMIES PO) Take 1 capsule by mouth at bedtime.   Yes [provider]  sertraline (ZOLOFT) 100 MG tablet Take 1 tablet by mouth once daily 07/05/21  Yes   sodium bicarbonate 650 MG tablet Take 1 tablet by mouth twice a day 90 days 06/27/21  Yes   TRESIBA FLEXTOUCH 200 UNIT/ML FlexTouch Pen Inject 0-30 Units into the skin as needed. Based on Sliding Scale patient has devised. Will Give insulin if BG is reading 150 or more 02/23/20  Yes [provider]  ondansetron (ZOFRAN ODT) 4 MG disintegrating tablet Take 1 tablet (4 mg total) by mouth every 8 (eight) hours as needed for nausea or vomiting. Patient not taking: Reported on 08/01/2021 09/01/20   Joy, Raquel Sarna C, PA-C  sodium bicarbonate 650 MG tablet TAKE  1 TABLET BY MOUTH TWICE DAILY Patient not taking: Reported on 08/01/2021 12/26/20 12/26/21  Kelton Pillar, MD    Physical Exam: Vitals:   08/01/21 0630 08/01/21 0700 08/01/21 0730 08/01/21 0751  BP: (!) 146/79 140/85 130/79   Pulse: 92 (!) 126 100   Resp: 14 (!) 21 17   Temp:    98.5 F (36.9 C)  TempSrc:    Oral  SpO2: 100% 100% 100%   Weight:      Height:        Constitutional: Awake alert and oriented x3, no associated distress.   Skin: no rashes, no lesions, good skin turgor noted. Eyes: Pupils are equally reactive to light.  No evidence of scleral icterus or conjunctival pallor.  ENMT: Moist mucous membranes noted.  Posterior pharynx clear of any exudate or lesions.   Neck: normal, supple, no masses, no thyromegaly.  No evidence of jugular venous distension.   Respiratory: clear to auscultation bilaterally, no wheezing, no crackles. Normal respiratory effort. No accessory muscle use.  Cardiovascular: Regular rate and rhythm, no murmurs / rubs / gallops. No extremity edema. 2+ pedal pulses. No carotid bruits.  Chest:   Nontender without crepitus or deformity.   Back:   Nontender without  crepitus or deformity. Abdomen: Notable periumbilical tenderness.  Abdomen is soft.  No evidence of intra-abdominal masses.  Positive bowel sounds noted in all quadrants.   Musculoskeletal: No joint deformity upper and lower extremities. Good ROM, no contractures. Normal muscle tone.  Neurologic: CN 2-12 grossly intact. Sensation intact.  Patient moving all 4 extremities spontaneously.  Patient is following all commands.  Patient is responsive to verbal stimuli.   Psychiatric: Patient exhibits normal mood with appropriate affect.  Patient seems to possess insight as to their current situation.     Labs on Admission: I have personally reviewed following labs and imaging studies -   CBC: Recent Labs  Lab 07/31/21 1552 07/31/21 1939 08/01/21 0040  WBC 10.8*  --   --   NEUTROABS 8.2*  --   --   HGB 12.1* 10.4* 9.4*  HCT 35.8* 29.9* 26.9*  MCV 95.0  --   --   PLT 285  --   --    Basic Metabolic Panel: Recent Labs  Lab 07/31/21 1552  NA 138  K 4.5  CL 104  CO2 25  GLUCOSE 121*  BUN 20  CREATININE 1.28*  CALCIUM 9.0   GFR: Estimated Creatinine Clearance: 54.9 mL/min (A) (by C-G formula based on SCr of 1.28 mg/dL (H)). Liver Function Tests: Recent Labs  Lab 07/31/21 1552  AST 22  ALT 23  ALKPHOS 75  BILITOT 0.3  PROT 6.3*  ALBUMIN 3.8   No results for input(s): LIPASE, AMYLASE in the last 168 hours. No results for input(s): AMMONIA in the last 168 hours. Coagulation Profile: Recent Labs  Lab 07/31/21 1552  INR 1.0   Cardiac Enzymes: No results for input(s): CKTOTAL, CKMB, CKMBINDEX, TROPONINI in the last 168 hours. BNP (last 3 results) No results for input(s): PROBNP in the last 8760 hours. HbA1C: No results for input(s): HGBA1C in the last 72 hours. CBG: No results for input(s): GLUCAP in the last 168 hours. Lipid Profile: No results for input(s): CHOL, HDL, LDLCALC, TRIG, CHOLHDL, LDLDIRECT in the last 72 hours. Thyroid Function Tests: No results for  input(s): TSH, T4TOTAL, FREET4, T3FREE, THYROIDAB in the last 72 hours. Anemia Panel: No results for input(s): VITAMINB12, FOLATE, FERRITIN, TIBC, IRON, RETICCTPCT in the last 72  hours. Urine analysis:    Component Value Date/Time   COLORURINE YELLOW 09/01/2020 1443   APPEARANCEUR CLEAR 09/01/2020 1443   LABSPEC 1.025 09/01/2020 1443   PHURINE 6.0 09/01/2020 1443   GLUCOSEU NEGATIVE 09/01/2020 1443   HGBUR TRACE (A) 09/01/2020 1443   BILIRUBINUR SMALL (A) 09/01/2020 1443   KETONESUR 15 (A) 09/01/2020 1443   PROTEINUR 30 (A) 09/01/2020 1443   UROBILINOGEN 0.2 04/18/2009 1518   NITRITE NEGATIVE 09/01/2020 1443   LEUKOCYTESUR NEGATIVE 09/01/2020 1443    Radiological Exams on Admission - Personally Reviewed: CT ANGIO GI BLEED  Result Date: 07/31/2021 CLINICAL DATA:  Question diverticular bleed. EXAM: CTA ABDOMEN AND PELVIS WITHOUT AND WITH CONTRAST TECHNIQUE: Multidetector CT imaging of the abdomen and pelvis was performed using the standard protocol during bolus administration of intravenous contrast. Multiplanar reconstructed images and MIPs were obtained and reviewed to evaluate the vascular anatomy. CONTRAST:  182m OMNIPAQUE IOHEXOL 350 MG/ML SOLN COMPARISON:  None. FINDINGS: VASCULAR Aorta: Normal caliber aorta without aneurysm, dissection, vasculitis or significant stenosis. Celiac: Patent without evidence of aneurysm, dissection, vasculitis or significant stenosis. SMA: Patent without evidence of aneurysm, dissection, vasculitis or significant stenosis. Renals: Both renal arteries are patent without evidence of aneurysm, dissection, vasculitis, fibromuscular dysplasia or significant stenosis. IMA: Patent without evidence of aneurysm, dissection, vasculitis or significant stenosis. Inflow: Patent without evidence of aneurysm, dissection, vasculitis or significant stenosis. Proximal Outflow: Bilateral common femoral and visualized portions of the superficial and profunda femoral arteries are  patent without evidence of aneurysm, dissection, vasculitis or significant stenosis. Veins: No obvious venous abnormality within the limitations of this arterial phase study. Review of the MIP images confirms the above findings. NON-VASCULAR Lower chest: No acute abnormality. Hepatobiliary: No focal liver abnormality is seen. No gallstones, gallbladder wall thickening, or biliary dilatation. Pancreas: There is a 6 mm hypodensity in the head of the pancreas image 6/51. The pancreas otherwise appears within normal limits. There is no surrounding inflammatory change or ductal dilatation. Spleen: Normal in size without focal abnormality. Adrenals/Urinary Tract: Adrenal glands are unremarkable. There is a rounded hypodensity in the right kidney which is too small to characterize, likely a cyst. Kidneys are otherwise normal, without renal calculi, focal lesion, or hydronephrosis. Bladder is unremarkable. Stomach/Bowel: There is no evidence for bowel obstruction. There is diffuse colonic diverticulosis. There is mild wall thickening and inflammation of the proximal sigmoid colon worrisome for acute diverticulitis. There is no evidence for perforation or abscess. The appendix is within normal limits. No dilated bowel loops are identified. Portal venous images there is some small areas of likely wall enhancement in jejunal loops images 12/31 through 35. There is no definite luminal arterial bleeding seen throughout small bowel, colon or stomach. There is no free air. Lymphatic: No significant vascular findings are present. No enlarged abdominal or pelvic lymph nodes. Reproductive: Prostate is unremarkable. Other: There is a small umbilical hernia containing mesenteric fat and nondilated small bowel. There is no ascites. Musculoskeletal: No acute or significant osseous findings. IMPRESSION: VASCULAR 1. No definite intraluminal arterial bleeding within the stomach, small bowel or colon. There is some mild enhancement within  the wall of jejunal loops on the portal venous phase only. This is of uncertain certain clinical significance. If there is high clinical concern for acute GI bleed consider repeat study as clinically warranted. NON-VASCULAR 1. Mild uncomplicated sigmoid colon diverticulitis. 2. 6 mm hypodense lesion in the head of the pancreas, unchanged. If patient is asymptomatic, recommend follow-up MRI in 12 months. Electronically  Signed   By: Ronney Asters M.D.   On: 07/31/2021 18:12    EKG: Personally reviewed.  Rhythm is sinus tachycardia with heart rate of 113 bpm.  No dynamic ST segment changes appreciated.  Assessment/Plan Principal Problem:   Acute lower gastrointestinal bleeding  Patient presents after exhibiting multiple bouts of dark red blood per rectum with concurrent abdominal pain thought to be secondary to acute sigmoid diverticulitis Serial CBCs thus far have revealed a drop in hemoglobin from 14.5 seven months ago to 12.1 on arrival, which has now dropped to 9.4. Continue to perform serial CBCs with intent to transfuse if hemoglobin drops below 7. CT angiogram of the abdomen pelvis reveals no obvious evidence of active bleeding. Treating concurrent diverticulitis with intravenous Zosyn Hydrating patient with intravenous isotonic fluids Epic secure chat request for consultation has already been sent to [facility] gastroenterology as Dr. Loletha Carrow performed the patient's last colonoscopy in 04/2019 Holding home regimen of Plavix  Active Problems:   Sigmoid diverticulitis  Evidence of sigmoid diverticulitis noted on CT imaging of the abdomen and pelvis Patient is a longstanding history of extensive diverticulosis which has predispose the patient to this flare of diverticulitis Treated patient with intravenous Zosyn for now Clear liquid diet for the time being which will be advanced slowly as tolerated as patient tolerates    Type 2 diabetes mellitus with diabetic polyneuropathy, with long-term  current use of insulin ([place])  Holding home regimen of hypoglycemics Patient is on an atypical sliding scale regimen of basal insulin Will start by placing patient on Accu-Cheks before every meal and nightly with sliding scale insulin and add a basal bolus insulin regimen based on degree of hyperglycemia Hemoglobin A1c pending    GERD without esophagitis  Transitioning from home regimen of oral PPI twice daily to IV PPI twice daily since patient is currently n.p.o.    Diabetic polyneuropathy associated with type 2 diabetes mellitus ([place])  Continue home regimen of gabapentin   Code Status:  Full code  code status decision has been confirmed with: spouse at bedside Family Communication: Spouse has been updated on plan of care  Status is: Inpatient  Remains inpatient appropriate because:Ongoing diagnostic testing needed not appropriate for outpatient work up, IV treatments appropriate due to intensity of illness or inability to take PO, and Inpatient level of care appropriate due to severity of illness  Dispo: The patient is from: Home              Anticipated d/c is to: Home              Patient currently is not medically stable to d/c.   Difficult to place patient No        Vernelle Emerald MD [facility] Pager [phone]  If 7PM-7AM, please contact night-coverage [email or web address] Use universal [place] password for that web site. If you do not have the password, please call the hospital operator.  08/01/2021, 7:54 AM

## 2021-08-01 NOTE — Progress Notes (Signed)
Pharmacy Antibiotic Note  Adam Quinn is a 65 y.o. male admitted on 07/31/2021 with  intra-abdominal infection .  Presented with BRBPR and lower abdominal pain.  CT angio showed diverticulitis.  Pharmacy has been consulted for Zosyn dosing.  WBC 10.8, Lactate 2.4, afebrile  Plan: Zosyn 3.375g IV q8h (4 hour infusion). Monitor clinical improvement, renal function, ability to narrow antibiotics  Height: '5\' 8"'$  (172.7 cm) Weight: 80 kg (176 lb 5.9 oz) IBW/kg (Calculated) : 68.4  Temp (24hrs), Avg:98.3 F (36.8 C), Min:98 F (36.7 C), Max:98.5 F (36.9 C)  Recent Labs  Lab 07/31/21 1552 07/31/21 1939 07/31/21 2144  WBC 10.8*  --   --   CREATININE 1.28*  --   --   LATICACIDVEN  --  2.5* 2.4*    Estimated Creatinine Clearance: 54.9 mL/min (A) (by C-G formula based on SCr of 1.28 mg/dL (H)).    Allergies  Allergen Reactions   Aspirin Anaphylaxis and Hives   Shrimp [Shellfish Allergy] Hives    Just shrimp     Antimicrobials this admission: Zosyn 9/7 >>   Microbiology results: 9/7 BCx: sent  Thank you for allowing pharmacy to be a part of this patient's care.  Dimple Nanas, PharmD 08/01/2021 7:59 AM

## 2021-08-01 NOTE — ED Notes (Signed)
Report given to [facility]  

## 2021-08-01 NOTE — ED Provider Notes (Signed)
Patient arrived from [place] for hospital admission for rectal bleeding.  He is currently resting comfortably, vital signs are normal.  Case is discussed with Dr. Cyd Silence of [facility] who will come to admit the patient.   Delora Fuel, MD [id number] [phone]

## 2021-08-01 NOTE — Progress Notes (Signed)
  PROGRESS NOTE  Patient admitted earlier this morning. See H&P.   Patient admitted to [facility] with complaints of abdominal pain and rectal bleeding.  His baseline hemoglobin is 14.5 and has slowly trended downward to 8.9 this morning.  CTA abdomen pelvis without active bleeding but did reveal sigmoid diverticulitis.  Overall, he states that he is feeling well, denies any dizziness or lightheadedness.  Blood pressure remains stable.  Continue to trend CBC.  Type and screen ordered Continue IV Zosyn Hold Plavix GI consult pending  Discussed with wife at bedside and answered her questions today.  Status is: Inpatient  Remains inpatient appropriate because:Inpatient level of care appropriate due to severity of illness  Dispo: The patient is from: Home              Anticipated d/c is to: Home              Patient currently is not medically stable to d/c.   Difficult to place patient No    Dessa Phi, DO [facility] 08/01/2021, 10:32 AM  Available via Epic secure chat 7am-7pm After these hours, please refer to coverage provider listed on [email or web address]

## 2021-08-01 NOTE — Progress Notes (Signed)
Chaplain engaged in an initial visit with Adam Quinn and his wife.  Chaplain offered support to them.  Chaplain will continue to follow-up.    08/01/21 1000  Clinical Encounter Type  Visited With Patient and family together  Visit Type Initial

## 2021-08-01 NOTE — ED Notes (Signed)
Patient placed on 2L [place] due to decrease in HG and increase in HR. Patient tolerating well.

## 2021-08-01 NOTE — ED Provider Notes (Signed)
Patient is more comfortable at this time. He appears pale, but his vitals have improved and heart rate is around 100.  Blood pressure is elevated.  No active bleeding at this time I discussed with patient and family about if there is any worsening, he may need interventional radiology evaluation. He may also need blood product transfusion once he arrives at [facility] It has been approximately 27 hours since his last dose of clopidogrel Patient and family are concerned about stopping clopidogrel for any extended period time due to risk of stroke.  I advised that this will need to be managed on a daily basis, but due to risk of bleeding outweighing the benefit, will defer his home meds for now    Ripley Fraise, MD 08/01/21 [phone]

## 2021-08-01 NOTE — ED Provider Notes (Signed)
I assumed care of patient at signout.  Patient has probable diverticular bleed and is on clopidogrel. His hemoglobin is have been trending downward.  Most recent hemoglobin is at 9.4.  Patient just had another large bloody bowel movement.  With any movement he becomes tachycardic, but at rest his heart rate is around the 100 Patient still waiting on a bed at [place] will be to transfer from ER to [place] [facility].  That way patient becomes any more unstable he can be resuscitated due to lack of blood products at this hospital Dr. Havery Moros with gastroenterology is aware of patient if any worsening occurs messaged with Dr. Roxanne Mins at [facility] for transfer Also messaged  with Dr. Tonie Griffith who had admitted patient to as well.  He will likely need to be upgraded to stepdown unit   Ripley Fraise, MD 08/01/21 [phone]

## 2021-08-01 NOTE — ED Notes (Signed)
Patient denies pain and is resting comfortably.  

## 2021-08-01 NOTE — H&P (View-Only) (Signed)
Referring Provider:  [facility]         Primary Care Physician:  Adam Pillar, MD Primary Gastroenterologist:  Adam Lund, MD   Reason for Consultation:   GI bleed                Attending physician's note   I have taken a history, examined the patient and reviewed the chart. I agree with the Advanced Practitioner's note, impression and recommendations.  65 year old admitted with diarrhea and hematochezia.  He has abdominal discomfort/cramping.  On abdominal exam has mild left lower quadrant tenderness  CTA showed sigmoid thickening Ischemic colitis is in the differential but will need to exclude brisk upper GI bleed from possible gastroduodenal ulcer given he has history of chronic NSAID use.    The risks and benefits as well as alternatives of endoscopic procedure(s) have been discussed and reviewed. All questions answered. The patient agrees to proceed.   Plavix on hold  Monitor H&H and transfuse as needed Clear liquid diet N.p.o. after 7 a.m. Pantoprazole IV twice daily  The patient was provided an opportunity to ask questions and all were answered. The patient agreed with the plan and demonstrated an understanding of the instructions.  Adam Quinn , MD [phone]     ASSESSMENT / PLAN    # 65 yo male with gastrointestinal bleeding / abdominal cramping. Ischemic colitis is a consideration in setting of pseudoephedrine type medication and possible volume depletion. CTA negative for definite luminal arterial bleeding but imaging does suggest mild uncomplicated diverticulitis.   A brisk upper GI bleed from PUD is not excluded given tachycardia and frequent NSAID. Small bowel ischemia less likely source of bleed but CTA shows mild enhancement within the wall of jejunal loops (on the portal venous phase only)  --Schedule for EGD to exclude a high risk upper GI lesion. The risks and benefits of EGD with possible biopsies were discussed with the patient who  agrees to proceed. Therapeutic intervention may be limited during the procedure since he will be only three days out from last Plavix dose.   --Currently getting Zosyn for potential sigmoid diverticulitis as mentioned above.  Since patients generally don't bleed with diverticulitis ( might findings on CTA represent ischemic colitis). .   # Hx of CVA. Last dose of Plavix was evening of 9/6.   # 6 mm hypodense lesion in the head of the pancreas, unchanged. Can follow up outpatient   # GERD, maintained on Prevacid at home.    HISTORY OF PRESENT ILLNESS                                                                                                                         Chief Complaint:  gastrointestinal bleeding  Adam Quinn is a 65 y.o. male with a past medical history significant for colon polyps, iron deficiency anemia followed by Hematology, CVA, DM, glaucoma,  OSA on CPAP. See PMH for any additional medical history.  ED course:  Patient came to ED yesterday with rectal bleeding. He noticed red blood in stool yesterday around lunch time. He had a few BMs prior but didn't check for blood.  No significant abdominal pain with,  or prior to the onset of bleeding, just mild cramping resolved with defecation. No nausea or vomiting.  He occasionally has constipation but hasn't previously seen any blood. He was very tachycardic on arrival to ED. Hgb was 12.1 (baseline 14.5). Hgb dropped 2 more grams to 10.4 on on repeat labs a few hours later. BUN normal. Mildly elevated creatinine of 1.28.   Overnight patient's hgb has declined further to 8.9. Heart rate better now around 104 but it increases to ~ 160's when upright ( per wife who is a Marine scientist). He is normotensive. No chest pain nor SOB. Last BM was sometimes before 3 am and per wife it was more maroon in color. He notes that his stomach was making loud noises early this am but that has resolved.   Adam Quinn takes a few doses of Advil 3-4 nights a  week for sinus problems. He is on Plavix, last dose was evening of 9/6. Of note he also take Alavert-D. He has a history of GERD, takes Prevacid at home.   Last polyp surveillance colonoscopy was in 2020. Diverticulosis found but otherwise the bowel prep was poor. Plan was to repeat colonoscopy 6 months later but it has not yet been Quinn. No Adam Quinn of colon cancer.      DIAGNOSTIC STUDIES:   CTA 07/31/21 IMPRESSION: VASCULAR   1. No definite intraluminal arterial bleeding within the stomach, small bowel or colon. There is some mild enhancement within the wall of jejunal loops on the portal venous phase only. This is of uncertain certain clinical significance. If there is high clinical concern for acute GI bleed consider repeat study as clinically warranted.   NON-VASCULAR   1. Mild uncomplicated sigmoid colon diverticulitis. 2. 6 mm hypodense lesion in the head of the pancreas, unchanged. If patient is asymptomatic, recommend follow-up MRI in 12 months.  PREVIOUS ENDOSCOPIC EVALUATIONS    June 2020 polyp surveillance colonoscopy  --There was stool in entire colon to the cecum. Diverticulosis. Recommended repeat colonoscopy in 6 months.   July 2016 Colonoscopy  --Complete exam, good prep. Diverticulosis. A 15 mm semi-pedunculated cecal polyp was removed but not retrieved.   June 2016 EGD for IDA --gastritis    Past Medical History:  Diagnosis Date   Allergy    seasonal   Anxiety    Cataract    cataract surgery bilaterally resolved issues   Central scotoma    Chronic kidney disease    small protien showing uses Lisinopril for this   Diverticulitis    Diverticulosis    DM (diabetes mellitus) ([place])    GERD (gastroesophageal reflux disease)    Glaucoma    History of colon polyps    History of hiatal hernia    History of kidney stones    x1   Iron deficiency anemia    Iron infusion 04/29/2019   Neuropathy    OSA on CPAP    Stroke ([place])    Wears glasses    READING     Past Surgical History:  Procedure Laterality Date   cataract surgery     bilateral   COLONOSCOPY     COLONOSCOPY N/A 06/04/2015   Procedure: COLONOSCOPY;  Surgeon: Inda Castle, MD;  Location: [facility]COPY;  Service: Endoscopy;  Laterality: N/A;  COLONOSCOPY WITH PROPOFOL N/A 05/09/2019   Procedure: COLONOSCOPY WITH PROPOFOL;  Surgeon: Doran Stabler, MD;  Location: [facility] END[facility]COPY;  Service: Gastroenterology;  Laterality: N/A;   ESOPHAGOGASTRODUODENOSCOPY N/A 06/04/2015   Procedure: ESOPHAGOGASTRODUODENOSCOPY (EGD);  Surgeon: Inda Castle, MD;  Location: [facility];  Service: Endoscopy;  Laterality: N/A;   UPPER GASTROINTESTINAL ENDOSCOPY     WISDOM TOOTH EXTRACTION     with sedation    Prior to Admission medications   Medication Sig Start Date End Date Taking? Authorizing Provider  clopidogrel (PLAVIX) 75 MG tablet Take 1 tablet (75 mg total) by mouth daily. 06/19/21  Yes Frann Rider, NP  Continuous Blood Gluc Receiver (FREESTYLE LIBRE 2 READER) DEVI USE AS DIRECTED FOR CONTINUOUS BLOOD SUGAR MONITORING 12/19/20 12/19/21 Yes Adam Pillar, MD  dorzolamide (TRUSOPT) 2 % ophthalmic solution INSTILL 1 DROP IN BOTH EYES TWICE A DAY 05/16/21  Yes   Dulaglutide (TRULICITY) 1.5 0000000 SOPN Inject under the skin once a week Patient taking differently: Inject 1.5 mg into the skin once a week. Friday 04/02/21  Yes   gabapentin (NEURONTIN) 300 MG capsule TAKE 1 CAPSULE BY MOUTH ONCE DAILY 02/22/21 02/22/22 Yes Adam Pillar, MD  ibuprofen (ADVIL,MOTRIN) 800 MG tablet Take 800 mg by mouth every 8 (eight) hours as needed.   Yes [provider]  lansoprazole (PREVACID) 30 MG capsule TAKE 1 CAPSULE BY MOUTH TWICE DAILY 02/22/21 02/22/22 Yes Adam Pillar, MD  loratadine-pseudoephedrine (CLARITIN-D 12-HOUR) 5-120 MG tablet Take 1 tablet by mouth at bedtime as needed for allergies. Alavert-D   Yes [provider]  LUMIGAN 0.01 % SOLN INSTILL 1 DROP INTO BOTH EYES AT BEDTIME  05/16/21  Yes   Melatonin 10 MG TABS Take 10 mg by mouth at bedtime.   Yes [provider]  metFORMIN (GLUCOPHAGE-XR) 500 MG 24 hr tablet TAKE 4 TABLETS BY MOUTH EVERY NIGHT AT BEDTIME Patient taking differently: Take 1,000 mg by mouth 2 (two) times daily. 02/22/21 02/22/22 Yes Adam Pillar, MD  Probiotic Product (RA PROBIOTIC GUMMIES PO) Take 1 capsule by mouth at bedtime.   Yes [provider]  sertraline (ZOLOFT) 100 MG tablet Take 1 tablet by mouth once daily 07/05/21  Yes   sodium bicarbonate 650 MG tablet Take 1 tablet by mouth twice a day 90 days 06/27/21  Yes   TRESIBA FLEXTOUCH 200 UNIT/ML FlexTouch Pen Inject 0-30 Units into the skin as needed. Based on Sliding Scale patient has devised. Will Give insulin if BG is reading 150 or more 02/23/20  Yes [provider]  ondansetron (ZOFRAN ODT) 4 MG disintegrating tablet Take 1 tablet (4 mg total) by mouth every 8 (eight) hours as needed for nausea or vomiting. Patient not taking: Reported on 08/01/2021 09/01/20   Arlean Hopping C, PA-C  sodium bicarbonate 650 MG tablet TAKE 1 TABLET BY MOUTH TWICE DAILY Patient not taking: Reported on 08/01/2021 12/26/20 12/26/21  Adam Pillar, MD    Current Facility-Administered Medications  Medication Dose Route Frequency Provider Last Rate Last Admin   0.9 %  sodium chloride infusion   Intravenous PRN Vernelle Emerald, MD 10 mL/hr at 08/01/21 0858 250 mL at 08/01/21 0858   acetaminophen (TYLENOL) tablet 650 mg  650 mg Oral Q6H PRN Vernelle Emerald, MD       Or   acetaminophen (TYLENOL) suppository 650 mg  650 mg Rectal Q6H PRN Shalhoub, Sherryll Burger, MD       Chlorhexidine Gluconate Cloth 2 % PADS 6 each  6  each Topical Daily Dessa Phi, DO   6 each at 08/01/21 0901   dorzolamide (TRUSOPT) 2 % ophthalmic solution 1 drop  1 drop Both Eyes BID Shalhoub, Sherryll Burger, MD       gabapentin (NEURONTIN) capsule 300 mg  300 mg Oral QHS Dessa Phi, DO       insulin aspart (novoLOG) injection 0-15  Units  0-15 Units Subcutaneous TID AC & HS Shalhoub, Sherryll Burger, MD   2 Units at 08/01/21 1150   lactated ringers infusion   Intravenous Continuous Shalhoub, Sherryll Burger, MD 125 mL/hr at 08/01/21 0852 New Bag at 08/01/21 0852   latanoprost (XALATAN) 0.005 % ophthalmic solution 1 drop  1 drop Both Eyes QHS Shalhoub, Sherryll Burger, MD       magnesium sulfate IVPB 2 g 50 mL  2 g Intravenous Once Dessa Phi, DO 50 mL/hr at 08/01/21 1153 2 g at 08/01/21 1153   MEDLINE mouth rinse  15 mL Mouth Rinse BID Dessa Phi, DO       melatonin tablet 10 mg  10 mg Oral QHS PRN Shalhoub, Sherryll Burger, MD       morphine 2 MG/ML injection 2 mg  2 mg Intravenous Q4H PRN Shalhoub, Sherryll Burger, MD       Or   morphine 4 MG/ML injection 4 mg  4 mg Intravenous Q4H PRN Shalhoub, Sherryll Burger, MD       ondansetron [facility]) tablet 4 mg  4 mg Oral Q6H PRN Vernelle Emerald, MD       Or   ondansetron [facility]) injection 4 mg  4 mg Intravenous Q6H PRN Shalhoub, Sherryll Burger, MD   4 mg at 08/01/21 0936   pantoprazole (PROTONIX) injection 40 mg  40 mg Intravenous Q12H Vernelle Emerald, MD   40 mg at 08/01/21 0903   piperacillin-tazobactam (ZOSYN) IVPB 3.375 g  3.375 g Intravenous Q8H Dimple Nanas, RPH 12.5 mL/hr at 08/01/21 0900 3.375 g at 08/01/21 0900   sertraline (ZOLOFT) tablet 100 mg  100 mg Oral Daily Vernelle Emerald, MD   100 mg at 08/01/21 0901    Allergies as of 07/31/2021 - Review Complete 07/31/2021  Allergen Reaction Noted   Aspirin Hives 03/16/2008   Shrimp [shellfish allergy] Hives 05/21/2015    Family History  Problem Relation Age of Onset   Colon polyps Father    Lung cancer Father    Diabetes Mother    Dementia Mother    Diabetes Sister        x 2   Stroke Maternal Grandmother    Stroke Paternal Grandfather    Colon cancer Neg Hx    Rectal cancer Neg Hx    Stomach cancer Neg Hx    Pancreatic cancer Neg Hx     Social History   Socioeconomic History   Marital status: Married    Spouse name: Not on  file   Number of children: 3   Years of education: Not on file   Highest education level: Not on file  Occupational History   Occupation: Optometrist  Tobacco Use   Smoking status: Former    Types: Cigarettes    Quit date: 10/04/1997    Years since quitting: 23.8   Smokeless tobacco: Never  Vaping Use   Vaping Use: Never used  Substance and Sexual Activity   Alcohol use: No   Drug use: No   Sexual activity: Not on file  Other Topics Concern   Not on file  Social  History Narrative   Not on file   Social Determinants of Health   Financial Resource Strain: Not on file  Food Insecurity: No Food Insecurity   Worried About Charity fundraiser in the Last Year: Never true   Ran Out of Food in the Last Year: Never true  Transportation Needs: No Transportation Needs   Lack of Transportation (Medical): No   Lack of Transportation (Non-Medical): No  Physical Activity: Not on file  Stress: Not on file  Social Connections: Not on file  Intimate Partner Violence: Not on file    Review of Systems: All systems reviewed and negative except where noted in HPI.  OBJECTIVE    Physical Exam: Vital signs in last 24 hours: Temp:  [98 F (36.7 C)-98.5 F (36.9 C)] 98.2 F (36.8 C) (09/08 1200) Pulse Rate:  [92-126] 101 (09/08 1200) Resp:  [10-21] 10 (09/08 1200) BP: (118-164)/(71-95) 138/81 (09/08 1200) SpO2:  [96 %-100 %] 100 % (09/08 1200) Weight:  [77.1 kg-80 kg] 80 kg (09/07 2147) Last BM Date: 08/01/21 General:   Alert  male in NAD Psych:  Pleasant, cooperative. Normal mood and affect. Eyes:  Pupils equal, sclera clear, no icterus.   Conjunctiva pink. Ears:  Normal auditory acuity. Nose:  No deformity, discharge,  or lesions. Neck:  Supple; no masses Lungs:  Clear throughout to auscultation.   No wheezes, crackles, or rhonchi.  Heart:  Slightly tachycardic , no lower extremity edema Abdomen:  Soft, non-distended, mild left mid abdominal tenderness.  BS active, no palp mass    Rectal:  Deferred  Msk:  Symmetrical without gross deformities. . Neurologic:  Alert and  oriented x4;  grossly normal neurologically. Skin:  Intact without significant lesions or rashes.   Scheduled inpatient medications  Chlorhexidine Gluconate Cloth  6 each Topical Daily   dorzolamide  1 drop Both Eyes BID   gabapentin  300 mg Oral QHS   insulin aspart  0-15 Units Subcutaneous TID AC & HS   latanoprost  1 drop Both Eyes QHS   mouth rinse  15 mL Mouth Rinse BID   pantoprazole (PROTONIX) IV  40 mg Intravenous Q12H   sertraline  100 mg Oral Daily      Intake/Output from previous day: 09/07 0701 - 09/08 0700 In: 1890 [P.O.:240; IV Piggyback:1650] Out: 600 [Urine:600] Intake/Output this shift: Total I/O In: -  Out: 325 [Urine:325]   Lab Results: Recent Labs    07/31/21 1552 07/31/21 1939 08/01/21 0040 08/01/21 0828  WBC 10.8*  --   --  7.1  HGB 12.1* 10.4* 9.4* 8.9*  HCT 35.8* 29.9* 26.9* 25.9*  PLT 285  --   --  228   BMET Recent Labs    07/31/21 1552 08/01/21 0828  NA 138 138  K 4.5 4.6  CL 104 101  CO2 25 26  GLUCOSE 121* 115*  BUN 20 18  CREATININE 1.28* 1.23  CALCIUM 9.0 8.8*   LFT Recent Labs    08/01/21 0828  PROT 5.8*  ALBUMIN 3.4*  AST 17  ALT 21  ALKPHOS 64  BILITOT 0.6   PT/INR Recent Labs    07/31/21 1552 08/01/21 0828  LABPROT 13.4 13.7  INR 1.0 1.1   Hepatitis Panel No results for input(s): HEPBSAG, HCVAB, HEPAIGM, HEPBIGM in the last 72 hours.   . CBC Latest Ref Rng & Units 08/01/2021 08/01/2021 07/31/2021  WBC 4.0 - 10.5 K/uL 7.1 - -  Hemoglobin 13.0 - 17.0 g/dL 8.9(L) 9.4(L) 10.4(L)  Hematocrit 39.0 - 52.0 % 25.9(L) 26.9(L) 29.9(L)  Platelets 150 - 400 K/uL 228 - -    . CMP Latest Ref Rng & Units 08/01/2021 07/31/2021 01/03/2021  Glucose 70 - 99 mg/dL 115(H) 121(H) 129(H)  BUN 8 - 23 mg/dL '18 20 12  '$ Creatinine 0.61 - 1.24 mg/dL 1.23 1.28(H) 1.20  Sodium 135 - 145 mmol/L 138 138 142  Potassium 3.5 - 5.1 mmol/L 4.6 4.5  4.9  Chloride 98 - 111 mmol/L 101 104 105  CO2 22 - 32 mmol/L '26 25 29  '$ Calcium 8.9 - 10.3 mg/dL 8.8(L) 9.0 10.1  Total Protein 6.5 - 8.1 g/dL 5.8(L) 6.3(L) 6.4(L)  Total Bilirubin 0.3 - 1.2 mg/dL 0.6 0.3 0.3  Alkaline Phos 38 - 126 U/L 64 75 81  AST 15 - 41 U/L 17 22 11(L)  ALT 0 - 44 U/L '21 23 11   '$ Studies/Results: CT ANGIO GI BLEED  Result Date: 07/31/2021 CLINICAL DATA:  Question diverticular bleed. EXAM: CTA ABDOMEN AND PELVIS WITHOUT AND WITH CONTRAST TECHNIQUE: Multidetector CT imaging of the abdomen and pelvis was performed using the standard protocol during bolus administration of intravenous contrast. Multiplanar reconstructed images and MIPs were obtained and reviewed to evaluate the vascular anatomy. CONTRAST:  153m OMNIPAQUE IOHEXOL 350 MG/ML SOLN COMPARISON:  None. FINDINGS: VASCULAR Aorta: Normal caliber aorta without aneurysm, dissection, vasculitis or significant stenosis. Celiac: Patent without evidence of aneurysm, dissection, vasculitis or significant stenosis. SMA: Patent without evidence of aneurysm, dissection, vasculitis or significant stenosis. Renals: Both renal arteries are patent without evidence of aneurysm, dissection, vasculitis, fibromuscular dysplasia or significant stenosis. IMA: Patent without evidence of aneurysm, dissection, vasculitis or significant stenosis. Inflow: Patent without evidence of aneurysm, dissection, vasculitis or significant stenosis. Proximal Outflow: Bilateral common femoral and visualized portions of the superficial and profunda femoral arteries are patent without evidence of aneurysm, dissection, vasculitis or significant stenosis. Veins: No obvious venous abnormality within the limitations of this arterial phase study. Review of the MIP images confirms the above findings. NON-VASCULAR Lower chest: No acute abnormality. Hepatobiliary: No focal liver abnormality is seen. No gallstones, gallbladder wall thickening, or biliary dilatation. Pancreas:  There is a 6 mm hypodensity in the head of the pancreas image 6/51. The pancreas otherwise appears within normal limits. There is no surrounding inflammatory change or ductal dilatation. Spleen: Normal in size without focal abnormality. Adrenals/Urinary Tract: Adrenal glands are unremarkable. There is a rounded hypodensity in the right kidney which is too small to characterize, likely a cyst. Kidneys are otherwise normal, without renal calculi, focal lesion, or hydronephrosis. Bladder is unremarkable. Stomach/Bowel: There is no evidence for bowel obstruction. There is diffuse colonic diverticulosis. There is mild wall thickening and inflammation of the proximal sigmoid colon worrisome for acute diverticulitis. There is no evidence for perforation or abscess. The appendix is within normal limits. No dilated bowel loops are identified. Portal venous images there is some small areas of likely wall enhancement in jejunal loops images 12/31 through 35. There is no definite luminal arterial bleeding seen throughout small bowel, colon or stomach. There is no free air. Lymphatic: No significant vascular findings are present. No enlarged abdominal or pelvic lymph nodes. Reproductive: Prostate is unremarkable. Other: There is a small umbilical hernia containing mesenteric fat and nondilated small bowel. There is no ascites. Musculoskeletal: No acute or significant osseous findings. IMPRESSION: VASCULAR 1. No definite intraluminal arterial bleeding within the stomach, small bowel or colon. There is some mild enhancement within the wall of jejunal loops on  the portal venous phase only. This is of uncertain certain clinical significance. If there is high clinical concern for acute GI bleed consider repeat study as clinically warranted. NON-VASCULAR 1. Mild uncomplicated sigmoid colon diverticulitis. 2. 6 mm hypodense lesion in the head of the pancreas, unchanged. If patient is asymptomatic, recommend follow-up MRI in 12 months.  Electronically Signed   By: Ronney Asters M.D.   On: 07/31/2021 18:12    Principal Problem:   Acute lower gastrointestinal bleeding Active Problems:   Type 2 diabetes mellitus with diabetic polyneuropathy, with long-term current use of insulin ([place])   GERD without esophagitis   Sigmoid diverticulitis   Diabetic polyneuropathy associated with type 2 diabetes mellitus ([place])    Tye Savoy, NP-C @  08/01/2021, 12:11 PM

## 2021-08-02 ENCOUNTER — Encounter (HOSPITAL_COMMUNITY)
Admission: EM | Disposition: A | Discharge status: Home / Self Care | Payer: Self-pay | Source: Home / Self Care | Attending: Internal Medicine

## 2021-08-02 ENCOUNTER — Telehealth: Payer: Self-pay

## 2021-08-02 ENCOUNTER — Inpatient Hospital Stay (HOSPITAL_COMMUNITY): Payer: HMO | Admitting: Certified Registered Nurse Anesthetist

## 2021-08-02 ENCOUNTER — Encounter (HOSPITAL_COMMUNITY): Payer: Self-pay | Admitting: Family Medicine

## 2021-08-02 DIAGNOSIS — K922 Gastrointestinal hemorrhage, unspecified: Secondary | ICD-10-CM | POA: Diagnosis not present

## 2021-08-02 DIAGNOSIS — D62 Acute posthemorrhagic anemia: Secondary | ICD-10-CM

## 2021-08-02 DIAGNOSIS — K625 Hemorrhage of anus and rectum: Secondary | ICD-10-CM

## 2021-08-02 DIAGNOSIS — R1012 Left upper quadrant pain: Secondary | ICD-10-CM

## 2021-08-02 DIAGNOSIS — K921 Melena: Secondary | ICD-10-CM

## 2021-08-02 HISTORY — PX: ESOPHAGOGASTRODUODENOSCOPY (EGD) WITH PROPOFOL: SHX5813

## 2021-08-02 LAB — CBC
HCT: 21.4 % — ABNORMAL LOW (ref 39.0–52.0)
Hemoglobin: 7.3 g/dL — ABNORMAL LOW (ref 13.0–17.0)
MCH: 32.7 pg (ref 26.0–34.0)
MCHC: 34.1 g/dL (ref 30.0–36.0)
MCV: 96 fL (ref 80.0–100.0)
Platelets: 183 10*3/uL (ref 150–400)
RBC: 2.23 MIL/uL — ABNORMAL LOW (ref 4.22–5.81)
RDW: 13.6 % (ref 11.5–15.5)
WBC: 5.3 10*3/uL (ref 4.0–10.5)
nRBC: 0 % (ref 0.0–0.2)

## 2021-08-02 LAB — BLOOD CULTURE ID PANEL (REFLEXED) - BCID2

## 2021-08-02 LAB — GLUCOSE, CAPILLARY
Glucose-Capillary: 127 mg/dL — ABNORMAL HIGH (ref 70–99)
Glucose-Capillary: 111 mg/dL — ABNORMAL HIGH (ref 70–99)
Glucose-Capillary: 111 mg/dL — ABNORMAL HIGH (ref 70–99)
Glucose-Capillary: 122 mg/dL — ABNORMAL HIGH (ref 70–99)

## 2021-08-02 LAB — IRON AND TIBC
Iron: 64 ug/dL (ref 45–182)
Saturation Ratios: 23 % (ref 17.9–39.5)
TIBC: 283 ug/dL (ref 250–450)
UIBC: 219 ug/dL

## 2021-08-02 LAB — BASIC METABOLIC PANEL
Anion gap: 6 (ref 5–15)
BUN: 15 mg/dL (ref 8–23)
CO2: 29 mmol/L (ref 22–32)
Calcium: 8.8 mg/dL — ABNORMAL LOW (ref 8.9–10.3)
Chloride: 105 mmol/L (ref 98–111)
Creatinine, Ser: 1.43 mg/dL — ABNORMAL HIGH (ref 0.61–1.24)
GFR, Estimated: 54 mL/min — ABNORMAL LOW (ref >60)
Glucose, Bld: 105 mg/dL — ABNORMAL HIGH (ref 70–99)
Potassium: 4.2 mmol/L (ref 3.5–5.1)
Sodium: 140 mmol/L (ref 135–145)

## 2021-08-02 LAB — PREPARE RBC (CROSSMATCH)

## 2021-08-02 LAB — FERRITIN: Ferritin: 64 ng/mL (ref 24–336)

## 2021-08-02 LAB — MAGNESIUM: Magnesium: 2 mg/dL (ref 1.7–2.4)

## 2021-08-02 SURGERY — ESOPHAGOGASTRODUODENOSCOPY (EGD) WITH PROPOFOL
Anesthesia: Monitor Anesthesia Care

## 2021-08-02 MED ORDER — PROPOFOL 500 MG/50ML IV EMUL
INTRAVENOUS | Status: DC | PRN
  Administered 2021-08-02: 125 ug/kg/min via INTRAVENOUS

## 2021-08-02 MED ORDER — LIDOCAINE HCL (CARDIAC) PF 100 MG/5ML IV SOSY
PREFILLED_SYRINGE | INTRAVENOUS | Status: DC | PRN
  Administered 2021-08-02: 100 mg via INTRAVENOUS

## 2021-08-02 MED ORDER — SODIUM CHLORIDE 0.9% IV SOLUTION: Freq: Once | INTRAVENOUS | Status: AC

## 2021-08-02 MED ORDER — AMOXICILLIN-POT CLAVULANATE 875-125 MG PO TABS
1.0000 | ORAL_TABLET | Freq: Two times a day (BID) | ORAL | Status: DC
  Administered 2021-08-02 – 2021-08-03 (×3): 1 via ORAL
  Filled 2021-08-02 (×3): qty 1

## 2021-08-02 MED ORDER — PROPOFOL 10 MG/ML IV BOLUS
INTRAVENOUS | Status: DC | PRN
Start: 2021-08-02 — End: 2021-08-02
  Administered 2021-08-02: 40 mg via INTRAVENOUS
  Administered 2021-08-02: 20 mg via INTRAVENOUS

## 2021-08-02 SURGICAL SUPPLY — 15 items

## 2021-08-02 NOTE — Transfer of Care (Signed)
Immediate Anesthesia Transfer of Care Note  Patient: Adam Quinn  Procedure(s) Performed: ESOPHAGOGASTRODUODENOSCOPY (EGD) WITH PROPOFOL  Patient Location: PACU and Endoscopy Unit  Anesthesia Type:MAC  Level of Consciousness: drowsy  Airway & Oxygen Therapy: Patient Spontanous Breathing and Patient connected to face mask oxygen  Post-op Assessment: Report given to RN and Post -op Vital signs reviewed and stable  Post vital signs: Reviewed and stable  Last Vitals:  Vitals Value Taken Time  BP    Temp    Pulse    Resp    SpO2      Last Pain:  Vitals:   08/02/21 1329  TempSrc: Oral  PainSc:          Complications: No notable events documented.

## 2021-08-02 NOTE — Op Note (Addendum)
[facility] Patient Name: Adam Quinn Procedure Date: 08/02/2021 MRN: [id number] Attending MD: Mauri Pole , MD Date of Birth: 27-Jun-1956 CSN: [id number] Age: 65 Admit Type: Inpatient Procedure:                Upper GI endoscopy Indications:              Recent gastrointestinal bleeding, Suspected upper                            gastrointestinal bleeding Providers:                Mauri Pole, MD, Carmie End, RN, Truddie Coco, RN Referring MD:              Medicines:                Monitored Anesthesia Care Complications:            No immediate complications. Estimated Blood Loss:     Estimated blood loss was minimal. Procedure:                Pre-Anesthesia Assessment:                           - Prior to the procedure, a History and Physical                            was performed, and patient medications and                            allergies were reviewed. The patient's tolerance of                            previous anesthesia was also reviewed. The risks                            and benefits of the procedure and the sedation                            options and risks were discussed with the patient.                            All questions were answered, and informed consent                            was obtained. Prior Anticoagulants: The patient                            last took Plavix (clopidogrel) 2 days prior to the                            procedure. ASA Grade Assessment: III - A patient  with severe systemic disease. After reviewing the                            risks and benefits, the patient was deemed in                            satisfactory condition to undergo the procedure.                           After obtaining informed consent, the endoscope was                            passed under direct vision. Throughout the                            procedure, the  patient's blood pressure, pulse, and                            oxygen saturations were monitored continuously. The                            [id number] Olympus endoscope was introduced                            through the mouth, and advanced to the second part                            of duodenum. The upper GI endoscopy was                            accomplished without difficulty. The patient                            tolerated the procedure well. Scope In: Scope Out: Findings:      The Z-line was regular and was found 38 cm from the incisors.      No gross lesions were noted in the entire esophagus.      The stomach was normal.      The cardia and gastric fundus were normal on retroflexion.      The examined duodenum was normal. Impression:               - Z-line regular, 38 cm from the incisors.                           - No gross lesions in esophagus.                           - Normal stomach.                           - Normal examined duodenum.                           - No specimens collected. Moderate Sedation:      Not Applicable - Patient had  care per Anesthesia. Recommendation:           - Patient has a contact number available for                            emergencies. The signs and symptoms of potential                            delayed complications were discussed with the                            patient. Return to normal activities tomorrow.                            Written discharge instructions were provided to the                            patient.                           - Resume previous diet.                           - Continue present medications.                           - Monitor Hgb and trasnfuse to [place] to [place] home tomorrow if no further bleeding                           - Restart Plavix in 2 days if no further bleeding                           - Likely etiology of bleeding ischemic colitis vs                             diverticular hemorrhage                           - Switch to oral Augmentin '875mg'$  BID X 7 days                           - Repeat H&H in 1 week                           - Follow up with Dr Loletha C[facility]) next                            available appointment and will plan to schedule for                            colonoscopy after office visit approx in 6-8 weeks                           -  GI will sign off, available if have any questions Procedure Code(s):        --- Professional ---                           [phone], Esophagogastroduodenoscopy, flexible,                            transoral; diagnostic, including collection of                            specimen(s) by brushing or washing, when performed                            (separate procedure) Diagnosis Code(s):        --- Professional ---                           K92.2, Gastrointestinal hemorrhage, unspecified CPT copyright 2019 [employer]. All rights reserved. The codes documented in this report are preliminary and upon coder review may  be revised to meet current compliance requirements. Mauri Pole, MD 08/02/2021 2:16:01 PM This report has been signed electronically. Number of Addenda: 0

## 2021-08-02 NOTE — Anesthesia Preprocedure Evaluation (Signed)
Anesthesia Evaluation  Patient identified by MRN, date of birth, ID band Patient awake    Reviewed: Allergy & Precautions, NPO status , Patient's Chart, lab work & pertinent test results  Airway Mallampati: II  TM Distance: >3 FB Neck ROM: Full    Dental no notable dental hx.    Pulmonary sleep apnea , former smoker,    Pulmonary exam normal breath sounds clear to auscultation       Cardiovascular hypertension, Normal cardiovascular exam Rhythm:Regular Rate:Normal     Neuro/Psych CVA negative psych ROS   GI/Hepatic Neg liver ROS, GERD  ,  Endo/Other  diabetes  Renal/GU negative Renal ROS  negative genitourinary   Musculoskeletal negative musculoskeletal ROS (+)   Abdominal   Peds negative pediatric ROS (+)  Hematology  (+) anemia ,   Anesthesia Other Findings   Reproductive/Obstetrics negative OB ROS                             Anesthesia Physical Anesthesia Plan  ASA: 3  Anesthesia Plan: MAC   Post-op Pain Management:    Induction: Intravenous  PONV Risk Score and Plan: 1 and Propofol infusion and Treatment may vary due to age or medical condition  Airway Management Planned: Simple Face Mask  Additional Equipment:   Intra-op Plan:   Post-operative Plan:   Informed Consent: I have reviewed the patients History and Physical, chart, labs and discussed the procedure including the risks, benefits and alternatives for the proposed anesthesia with the patient or authorized representative who has indicated his/her understanding and acceptance.     Dental advisory given  Plan Discussed with: CRNA and Surgeon  Anesthesia Plan Comments:         Anesthesia Quick Evaluation

## 2021-08-02 NOTE — Anesthesia Postprocedure Evaluation (Signed)
Anesthesia Post Note  Patient: Adam Quinn  Procedure(s) Performed: ESOPHAGOGASTRODUODENOSCOPY (EGD) WITH PROPOFOL     Patient location during evaluation: PACU Anesthesia Type: MAC Level of consciousness: awake and alert Pain management: pain level controlled Vital Signs Assessment: post-procedure vital signs reviewed and stable Respiratory status: spontaneous breathing, nonlabored ventilation, respiratory function stable and patient connected to nasal cannula oxygen Cardiovascular status: stable and blood pressure returned to baseline Postop Assessment: no apparent nausea or vomiting Anesthetic complications: no   No notable events documented.  Last Vitals:  Vitals:   08/02/21 1525 08/02/21 1530  BP:  (!) 152/74  Pulse: 88 93  Resp: 16 20  Temp:    SpO2: 95% 97%    Last Pain:  Vitals:   08/02/21 1515  TempSrc: Oral  PainSc: 0-No pain                 Lalia Loudon S

## 2021-08-02 NOTE — Progress Notes (Signed)
PROGRESS NOTE    Adam Quinn  [id number] DOB: June 05, 1956 DOA: 07/31/2021 PCP: Kelton Pillar, MD     Brief Narrative:  Adam Quinn is a 65 year old male with past medical history of diabetes mellitus type 2, diverticulosis (last colonoscopy 04/2019), gastroesophageal reflux disease, stroke (2017, 2020) who presents to [facility] long hospital emergency department as a transfer from [facility] emergency department after the patient began to experience abdominal pain and rectal bleeding.  CT angiogram of the abdomen and pelvis was performed and revealed no evidence of active bleeding but did reveal sigmoid diverticulitis.  Patient initiated on intravenous Zosyn.  Due to concerns for ongoing bleeding and possible need for blood transfusion, patient was admitted and GI consulted.   New events last 24 hours / Subjective: Patient states that he has not had a bowel movement since being admitted to [facility].  He is overall feeling well without any new complaints.  Had some episodes of nausea last night but resolved now.  Assessment & Plan:   Principal Problem:   Acute lower gastrointestinal bleeding Active Problems:   Type 2 diabetes mellitus with diabetic polyneuropathy, with long-term current use of insulin (HCC)   GERD without esophagitis   Sigmoid diverticulitis   Diabetic polyneuropathy associated with type 2 diabetes mellitus (HCC)   GI bleeding with acute blood loss anemia -GI consulted, EGD today -CBC trended down from 14.5 --> 7.3 -Transfuse 1 unit packed red blood cell today -Check iron panel -Hold Plavix  Acute sigmoid diverticulitis -Continue IV Zosyn  Diabetes mellitus 2 with neuropathy -Hemoglobin A1c 5.8 -Continue sliding scale insulin, Neurontin  Gram-positive cocci in blood culture -1 of 4 bottles, likely a contaminant  Depression -Continue Zoloft    DVT prophylaxis:  SCDs Start: 08/01/21 0754  Code Status:     Code Status Orders   (From admission, onward)           Start     Ordered   08/01/21 0754  Full code  Continuous        08/01/21 0754           Code Status History     This patient has a current code status but no historical code status.      Advance Directive Documentation    Flowsheet Row Most Recent Value  Type of Advance Directive Healthcare Power of Attorney, Living will  Pre-existing out of facility DNR order (yellow form or pink MOST form) --  "MOST" Form in Place? --      Family Communication: Spouse at bedside Disposition Plan:  Status is: Inpatient  Remains inpatient appropriate because:Inpatient level of care appropriate due to severity of illness  Dispo: The patient is from: Home              Anticipated d/c is to: Home              Patient currently is not medically stable to d/c.   Difficult to place patient No      Consultants:  GI  Procedures:  EGD 9/9  Antimicrobials:  Anti-infectives (From admission, onward)    Start     Dose/Rate Route Frequency Ordered Stop   08/01/21 0800  piperacillin-tazobactam (ZOSYN) IVPB 3.375 g        3.375 g 12.5 mL/hr over 240 Minutes Intravenous Every 8 hours 08/01/21 0757     07/31/21 2015  piperacillin-tazobactam (ZOSYN) IVPB 3.375 g        3.375 g 100  mL/hr over 30 Minutes Intravenous  Once 07/31/21 2005 07/31/21 2255        Objective: Vitals:   08/02/21 0600 08/02/21 0700 08/02/21 0800 08/02/21 0900  BP: 138/62 117/61 133/76 (!) 142/67  Pulse: 90 91 97 98  Resp: '14 13 10 12  '$ Temp:  (!) 97.4 F (36.3 C)  98.5 F (36.9 C)  TempSrc:  Oral  Oral  SpO2: 100% 100% 100% 100%  Weight:      Height:        Intake/Output Summary (Last 24 hours) at 08/02/2021 1206 Last data filed at 08/02/2021 0900 Gross per 24 hour  Intake 3733.38 ml  Output 3150 ml  Net 583.38 ml   Filed Weights   07/31/21 1546 07/31/21 2147  Weight: 77.1 kg 80 kg    Examination:  General exam: Appears calm and comfortable,  pale Respiratory system: Clear to auscultation. Respiratory effort normal. No respiratory distress. No conversational dyspnea.  Cardiovascular system: S1 & S2 heard, RRR. No murmurs. No pedal edema. Gastrointestinal system: Abdomen is nondistended, soft and nontender. Normal bowel sounds heard. Central nervous system: Alert and oriented. No focal neurological deficits. Speech clear.  Extremities: Symmetric in appearance  Skin: No rashes, lesions or ulcers on exposed skin  Psychiatry: Judgement and insight appear normal. Mood & affect appropriate.   Data Reviewed: I have personally reviewed following labs and imaging studies  CBC: Recent Labs  Lab 07/31/21 1552 07/31/21 1939 08/01/21 0040 08/01/21 0828 08/01/21 1204 08/01/21 1940 08/02/21 0711  WBC 10.8*  --   --  7.1 6.8 5.8 5.3  NEUTROABS 8.2*  --   --   --   --   --   --   HGB 12.1*   < > 9.4* 8.9* 8.6* 8.2* 7.3*  HCT 35.8*   < > 26.9* 25.9* 25.0* 24.0* 21.4*  MCV 95.0  --   --  94.5 94.0 94.5 96.0  PLT 285  --   --  228 202 204 183   < > = values in this interval not displayed.   Basic Metabolic Panel: Recent Labs  Lab 07/31/21 1552 08/01/21 0828 08/02/21 0224  NA 138 138 140  K 4.5 4.6 4.2  CL 104 101 105  CO2 '25 26 29  '$ GLUCOSE 121* 115* 105*  BUN '20 18 15  '$ CREATININE 1.28* 1.23 1.43*  CALCIUM 9.0 8.8* 8.8*  MG  --  1.6* 2.0   GFR: Estimated Creatinine Clearance: 49.2 mL/min (A) (by C-G formula based on SCr of 1.43 mg/dL (H)). Liver Function Tests: Recent Labs  Lab 07/31/21 1552 08/01/21 0828  AST 22 17  ALT 23 21  ALKPHOS 75 64  BILITOT 0.3 0.6  PROT 6.3* 5.8*  ALBUMIN 3.8 3.4*   No results for input(s): LIPASE, AMYLASE in the last 168 hours. No results for input(s): AMMONIA in the last 168 hours. Coagulation Profile: Recent Labs  Lab 07/31/21 1552 08/01/21 0828  INR 1.0 1.1   Cardiac Enzymes: No results for input(s): CKTOTAL, CKMB, CKMBINDEX, TROPONINI in the last 168 hours. BNP (last 3  results) No results for input(s): PROBNP in the last 8760 hours. HbA1C: Recent Labs    08/01/21 0828  HGBA1C 5.8*   CBG: Recent Labs  Lab 08/01/21 1142 08/01/21 1625 08/01/21 2200 08/02/21 0747 08/02/21 1115  GLUCAP 135* 116* 124* 127* 111*   Lipid Profile: No results for input(s): CHOL, HDL, LDLCALC, TRIG, CHOLHDL, LDLDIRECT in the last 72 hours. Thyroid Function Tests: No results for input(s): TSH,  T4TOTAL, FREET4, T3FREE, THYROIDAB in the last 72 hours. Anemia Panel: Recent Labs    08/02/21 0224  FERRITIN 64  TIBC 283  IRON 64   Sepsis Labs: Recent Labs  Lab 07/31/21 1939 07/31/21 2144  LATICACIDVEN 2.5* 2.4*    Recent Results (from the past 240 hour(s))  Culture, blood (routine x 2)     Status: None (Preliminary result)   Collection Time: 07/31/21  9:44 PM   Specimen: BLOOD LEFT FOREARM  Result Value Ref Range Status   Specimen Description   Final    BLOOD LEFT FOREARM Performed at [facility], [address] [place], [place] [address]    Special Requests   Final    BOTTLES DRAWN AEROBIC AND ANAEROBIC Blood Culture adequate volume Performed at [facility], [address] [place], [place] [address]    Culture   Final    NO GROWTH < 24 HOURS Performed at [facility], [place] [address] [place], [place] [address]    Report Status PENDING  Incomplete  Resp Panel by RT-PCR (Flu A&B, Covid) Nasopharyngeal Swab     Status: None   Collection Time: 07/31/21  9:44 PM   Specimen: Nasopharyngeal Swab; Nasopharyngeal(NP) swabs in vial transport medium  Result Value Ref Range Status   SARS Coronavirus 2 by RT PCR NEGATIVE NEGATIVE Final    Comment: (NOTE) SARS-CoV-2 target nucleic acids are NOT DETECTED.  The SARS-CoV-2 RNA is generally detectable in upper respiratory specimens during the acute phase of infection. The lowest concentration of SARS-CoV-2 viral copies this assay can detect is 138 copies/mL. A negative result does not  preclude SARS-Cov-2 infection and should not be used as the sole basis for treatment or other patient management decisions. A negative result may occur with  improper specimen collection/handling, submission of specimen other than nasopharyngeal swab, presence of viral mutation(s) within the areas targeted by this assay, and inadequate number of viral copies(<138 copies/mL). A negative result must be combined with clinical observations, patient history, and epidemiological information. The expected result is Negative.  Fact Sheet for Patients:  EntrepreneurPulse.com.au  Fact Sheet for Healthcare Providers:  IncredibleEmployment.be  This test is no t yet approved or cleared by the [place] FDA and  has been authorized for detection and/or diagnosis of SARS-CoV-2 by FDA under an Emergency Use Authorization (EUA). This EUA will remain  in effect (meaning this test can be used) for the duration of the COVID-19 declaration under Section 564(b)(1) of the Act, 21 U.S.C.section 360bbb-3(b)(1), unless the authorization is terminated  or revoked sooner.       Influenza A by PCR NEGATIVE NEGATIVE Final   Influenza B by PCR NEGATIVE NEGATIVE Final    Comment: (NOTE) The Xpert Xpress SARS-CoV-2/FLU/RSV plus assay is intended as an aid in the diagnosis of influenza from Nasopharyngeal swab specimens and should not be used as a sole basis for treatment. Nasal washings and aspirates are unacceptable for Xpert Xpress SARS-CoV-2/FLU/RSV testing.  Fact Sheet for Patients: EntrepreneurPulse.com.au  Fact Sheet for Healthcare Providers: IncredibleEmployment.be  This test is not yet approved or cleared by the [place] FDA and has been authorized for detection and/or diagnosis of SARS-CoV-2 by FDA under an Emergency Use Authorization (EUA). This EUA will remain in effect (meaning this test can be used) for the  duration of the COVID-19 declaration under Section 564(b)(1) of the Act, 21 U.S.C. section 360bbb-3(b)(1), unless the authorization is terminated or revoked.  Performed at [facility], [address] [place], [place] [address]   Culture, blood (routine x 2)     Status: None (Preliminary result)   Collection Time: 07/31/21  9:45 PM   Specimen: BLOOD  Result Value Ref Range Status   Specimen Description   Final    BLOOD LEFT ANTECUBITAL Performed at [facility], [address] [place], [place] [address]    Special Requests   Final    BOTTLES DRAWN AEROBIC AND ANAEROBIC Blood Culture adequate volume Performed at [facility], [address] [place], [place] [address]    Culture  Setup Time   Final    GRAM POSITIVE COCCI AEROBIC BOTTLE ONLY CRITICAL RESULT CALLED TO, READ BACK BY AND VERIFIED WITH: PHARMD NATHEN BATCHELDER 08/02/21'@7'$ :12 BY TW    Culture   Final    NO GROWTH < 24 HOURS Performed at [facility], [address] [place], [place] [address]    Report Status PENDING  Incomplete  Blood Culture ID Panel (Reflexed)     Status: Abnormal   Collection Time: 07/31/21  9:45 PM  Result Value Ref Range Status   Enterococcus faecalis NOT DETECTED NOT DETECTED Final   Enterococcus Faecium NOT DETECTED NOT DETECTED Final   Listeria monocytogenes NOT DETECTED NOT DETECTED Final   Staphylococcus species DETECTED (A) NOT DETECTED Final    Comment: CRITICAL RESULT CALLED TO, READ BACK BY AND VERIFIED WITH: PHARMD NATHEN BATCHELDER 08/02/21'@7'$ :12 BY TW    Staphylococcus aureus (BCID) NOT DETECTED NOT DETECTED Final   Staphylococcus epidermidis NOT DETECTED NOT DETECTED Final   Staphylococcus lugdunensis NOT DETECTED NOT DETECTED Final   Streptococcus species NOT DETECTED NOT DETECTED Final   Streptococcus agalactiae NOT DETECTED NOT DETECTED Final   Streptococcus pneumoniae NOT DETECTED NOT DETECTED Final   Streptococcus pyogenes NOT  DETECTED NOT DETECTED Final   A.calcoaceticus-baumannii NOT DETECTED NOT DETECTED Final   Bacteroides fragilis NOT DETECTED NOT DETECTED Final   Enterobacterales NOT DETECTED NOT DETECTED Final   Enterobacter cloacae complex NOT DETECTED NOT DETECTED Final   Escherichia coli NOT DETECTED NOT DETECTED Final   Klebsiella aerogenes NOT DETECTED NOT DETECTED Final   Klebsiella oxytoca NOT DETECTED NOT DETECTED Final   Klebsiella pneumoniae NOT DETECTED NOT DETECTED Final   Proteus species NOT DETECTED NOT DETECTED Final   Salmonella species NOT DETECTED NOT DETECTED Final   Serratia marcescens NOT DETECTED NOT DETECTED Final   Haemophilus influenzae NOT DETECTED NOT DETECTED Final   Neisseria meningitidis NOT DETECTED NOT DETECTED Final   Pseudomonas aeruginosa NOT DETECTED NOT DETECTED Final   Stenotrophomonas maltophilia NOT DETECTED NOT DETECTED Final   Candida albicans NOT DETECTED NOT DETECTED Final   Candida auris NOT DETECTED NOT DETECTED Final   Candida glabrata NOT DETECTED NOT DETECTED Final   Candida krusei NOT DETECTED NOT DETECTED Final   Candida parapsilosis NOT DETECTED NOT DETECTED Final   Candida tropicalis NOT DETECTED NOT DETECTED Final   Cryptococcus neoformans/gattii NOT DETECTED NOT DETECTED Final    Comment: Performed at [facility], [place]. [address] [place], [place] [address]  MRSA Next Gen by PCR, Nasal     Status: None   Collection Time: 08/01/21  8:40 AM   Specimen: Nasal Mucosa; Nasal Swab  Result Value Ref Range Status   MRSA by PCR Next Gen NOT DETECTED NOT DETECTED Final    Comment: (NOTE) The GeneXpert MRSA Assay (FDA approved for NASAL specimens only), is  one component of a comprehensive MRSA colonization surveillance program. It is not intended to diagnose MRSA infection nor to guide or monitor treatment for MRSA infections. Test performance is not FDA approved in patients less than 31 years old. Performed at [facility], [place] [address] [place], [place] [address]       Radiology Studies: CT ANGIO GI BLEED  Result Date: 07/31/2021 CLINICAL DATA:  Question diverticular bleed. EXAM: CTA ABDOMEN AND PELVIS WITHOUT AND WITH CONTRAST TECHNIQUE: Multidetector CT imaging of the abdomen and pelvis was performed using the standard protocol during bolus administration of intravenous contrast. Multiplanar reconstructed images and MIPs were obtained and reviewed to evaluate the vascular anatomy. CONTRAST:  15m OMNIPAQUE IOHEXOL 350 MG/ML SOLN COMPARISON:  None. FINDINGS: VASCULAR Aorta: Normal caliber aorta without aneurysm, dissection, vasculitis or significant stenosis. Celiac: Patent without evidence of aneurysm, dissection, vasculitis or significant stenosis. SMA: Patent without evidence of aneurysm, dissection, vasculitis or significant stenosis. Renals: Both renal arteries are patent without evidence of aneurysm, dissection, vasculitis, fibromuscular dysplasia or significant stenosis. IMA: Patent without evidence of aneurysm, dissection, vasculitis or significant stenosis. Inflow: Patent without evidence of aneurysm, dissection, vasculitis or significant stenosis. Proximal Outflow: Bilateral common femoral and visualized portions of the superficial and profunda femoral arteries are patent without evidence of aneurysm, dissection, vasculitis or significant stenosis. Veins: No obvious venous abnormality within the limitations of this arterial phase study. Review of the MIP images confirms the above findings. NON-VASCULAR Lower chest: No acute abnormality. Hepatobiliary: No focal liver abnormality is seen. No gallstones, gallbladder wall thickening, or biliary dilatation. Pancreas: There is a 6 mm hypodensity in the head of the pancreas image 6/51. The pancreas otherwise appears within normal limits. There is no surrounding inflammatory change or ductal dilatation. Spleen: Normal in size without focal abnormality. Adrenals/Urinary  Tract: Adrenal glands are unremarkable. There is a rounded hypodensity in the right kidney which is too small to characterize, likely a cyst. Kidneys are otherwise normal, without renal calculi, focal lesion, or hydronephrosis. Bladder is unremarkable. Stomach/Bowel: There is no evidence for bowel obstruction. There is diffuse colonic diverticulosis. There is mild wall thickening and inflammation of the proximal sigmoid colon worrisome for acute diverticulitis. There is no evidence for perforation or abscess. The appendix is within normal limits. No dilated bowel loops are identified. Portal venous images there is some small areas of likely wall enhancement in jejunal loops images 12/31 through 35. There is no definite luminal arterial bleeding seen throughout small bowel, colon or stomach. There is no free air. Lymphatic: No significant vascular findings are present. No enlarged abdominal or pelvic lymph nodes. Reproductive: Prostate is unremarkable. Other: There is a small umbilical hernia containing mesenteric fat and nondilated small bowel. There is no ascites. Musculoskeletal: No acute or significant osseous findings. IMPRESSION: VASCULAR 1. No definite intraluminal arterial bleeding within the stomach, small bowel or colon. There is some mild enhancement within the wall of jejunal loops on the portal venous phase only. This is of uncertain certain clinical significance. If there is high clinical concern for acute GI bleed consider repeat study as clinically warranted. NON-VASCULAR 1. Mild uncomplicated sigmoid colon diverticulitis. 2. 6 mm hypodense lesion in the head of the pancreas, unchanged. If patient is asymptomatic, recommend follow-up MRI in 12 months. Electronically Signed   By: ARonney AstersM.D.   On: 07/31/2021 18:12      Scheduled Meds:  sodium chloride   Intravenous Once   Chlorhexidine Gluconate Cloth  6 each  Topical Daily   dorzolamide  1 drop Both Eyes BID   gabapentin  300 mg Oral QHS    insulin aspart  0-15 Units Subcutaneous TID AC & HS   latanoprost  1 drop Both Eyes QHS   mouth rinse  15 mL Mouth Rinse BID   pantoprazole (PROTONIX) IV  40 mg Intravenous Q12H   sertraline  100 mg Oral Daily   Continuous Infusions:  sodium chloride Stopped (08/02/21 [id number])   lactated ringers 125 mL/hr at 08/02/21 1117   piperacillin-tazobactam (ZOSYN)  IV Stopped (08/02/21 1100)     LOS: 1 day      Time spent: 30 minutes   Dessa Phi, DO [facility] 08/02/2021, 12:06 PM   Available via Epic secure chat 7am-7pm After these hours, please refer to coverage provider listed on [email or web address]

## 2021-08-02 NOTE — Progress Notes (Signed)
Patient for EGD today. He has been NPO since MN.  Further decline in hgb overnight ( 8.2 >> 7/3) in absence of any further bleeding. VSS, tachycardia resolved.

## 2021-08-02 NOTE — Progress Notes (Signed)
PHARMACY - PHYSICIAN COMMUNICATION CRITICAL VALUE ALERT - BLOOD CULTURE IDENTIFICATION (BCID)  Adam Quinn is an 65 y.o. male who presented to [facility] on 07/31/2021 with a chief complaint of diverticulitis  Assessment:  9/7 BCx sent, BCID resulted 9/9 with 1 of 4 bottles Staph species, would consider contaminant  Name of physician (or Provider) Contacted: Dr Maylene Roes  Current antibiotics: Zosyn 3.375gm q8  Changes to prescribed antibiotics recommended: none, consider contaminant   Results for orders placed or performed during the hospital encounter of 07/31/21  Blood Culture ID Panel (Reflexed) (Collected: 07/31/2021  9:45 PM)  Result Value Ref Range   Enterococcus faecalis NOT DETECTED NOT DETECTED   Enterococcus Faecium NOT DETECTED NOT DETECTED   Listeria monocytogenes NOT DETECTED NOT DETECTED   Staphylococcus species DETECTED (A) NOT DETECTED   Staphylococcus aureus (BCID) NOT DETECTED NOT DETECTED   Staphylococcus epidermidis NOT DETECTED NOT DETECTED   Staphylococcus lugdunensis NOT DETECTED NOT DETECTED   Streptococcus species NOT DETECTED NOT DETECTED   Streptococcus agalactiae NOT DETECTED NOT DETECTED   Streptococcus pneumoniae NOT DETECTED NOT DETECTED   Streptococcus pyogenes NOT DETECTED NOT DETECTED   A.calcoaceticus-baumannii NOT DETECTED NOT DETECTED   Bacteroides fragilis NOT DETECTED NOT DETECTED   Enterobacterales NOT DETECTED NOT DETECTED   Enterobacter cloacae complex NOT DETECTED NOT DETECTED   Escherichia coli NOT DETECTED NOT DETECTED   Klebsiella aerogenes NOT DETECTED NOT DETECTED   Klebsiella oxytoca NOT DETECTED NOT DETECTED   Klebsiella pneumoniae NOT DETECTED NOT DETECTED   Proteus species NOT DETECTED NOT DETECTED   Salmonella species NOT DETECTED NOT DETECTED   Serratia marcescens NOT DETECTED NOT DETECTED   Haemophilus influenzae NOT DETECTED NOT DETECTED   Neisseria meningitidis NOT DETECTED NOT DETECTED   Pseudomonas aeruginosa NOT DETECTED  NOT DETECTED   Stenotrophomonas maltophilia NOT DETECTED NOT DETECTED   Candida albicans NOT DETECTED NOT DETECTED   Candida auris NOT DETECTED NOT DETECTED   Candida glabrata NOT DETECTED NOT DETECTED   Candida krusei NOT DETECTED NOT DETECTED   Candida parapsilosis NOT DETECTED NOT DETECTED   Candida tropicalis NOT DETECTED NOT DETECTED   Cryptococcus neoformans/gattii NOT DETECTED NOT DETECTED    Minda Ditto PharmD 08/02/2021  7:20 AM

## 2021-08-02 NOTE — Telephone Encounter (Signed)
Patient has been scheduled for a follow up with Dr. Loletha Carrow on Friday, 08/30/21 at 1:40 PM. Letter mailed to patient with appt information.  Lab reminder in epic.

## 2021-08-02 NOTE — Interval H&P Note (Signed)
History and Physical Interval Note:  08/02/2021 1:45 PM  Adam Quinn  has presented today for surgery, with the diagnosis of Gastrointestinal bleeding.  The various methods of treatment have been discussed with the patient and family. After consideration of risks, benefits and other options for treatment, the patient has consented to  Procedure(s): ESOPHAGOGASTRODUODENOSCOPY (EGD) WITH PROPOFOL (N/A) as a surgical intervention.  The patient's history has been reviewed, patient examined, no change in status, stable for surgery.  I have reviewed the patient's chart and labs.  Questions were answered to the patient's satisfaction.     [name]

## 2021-08-02 NOTE — Telephone Encounter (Signed)
-----   Message from Mauri Pole, MD sent at 08/02/2021  2:25 PM EDT ----- This patient will need H&H next week on Thursday 9/15 and follow up with Dr Loletha Carrow or APP next available appt.  Thanks VN

## 2021-08-03 ENCOUNTER — Encounter (HOSPITAL_COMMUNITY): Payer: Self-pay | Admitting: Gastroenterology

## 2021-08-03 LAB — BASIC METABOLIC PANEL
Anion gap: 6 (ref 5–15)
BUN: 13 mg/dL (ref 8–23)
CO2: 30 mmol/L (ref 22–32)
Calcium: 9.4 mg/dL (ref 8.9–10.3)
Chloride: 109 mmol/L (ref 98–111)
Creatinine, Ser: 1.57 mg/dL — ABNORMAL HIGH (ref 0.61–1.24)
GFR, Estimated: 48 mL/min — ABNORMAL LOW (ref >60)
Glucose, Bld: 107 mg/dL — ABNORMAL HIGH (ref 70–99)
Potassium: 4.3 mmol/L (ref 3.5–5.1)
Sodium: 145 mmol/L (ref 135–145)

## 2021-08-03 LAB — CBC
HCT: 26.8 % — ABNORMAL LOW (ref 39.0–52.0)
Hemoglobin: 9.1 g/dL — ABNORMAL LOW (ref 13.0–17.0)
MCH: 32.3 pg (ref 26.0–34.0)
MCHC: 34 g/dL (ref 30.0–36.0)
MCV: 95 fL (ref 80.0–100.0)
Platelets: 191 10*3/uL (ref 150–400)
RBC: 2.82 MIL/uL — ABNORMAL LOW (ref 4.22–5.81)
RDW: 13.6 % (ref 11.5–15.5)
WBC: 5.9 10*3/uL (ref 4.0–10.5)
nRBC: 0 % (ref 0.0–0.2)

## 2021-08-03 LAB — GLUCOSE, CAPILLARY: Glucose-Capillary: 124 mg/dL — ABNORMAL HIGH (ref 70–99)

## 2021-08-03 MED ORDER — AMOXICILLIN-POT CLAVULANATE 875-125 MG PO TABS: 1.0000 | ORAL_TABLET | Freq: Two times a day (BID) | ORAL | 0 refills | Status: AC

## 2021-08-03 NOTE — Discharge Summary (Signed)
Physician Discharge Summary  Adam Quinn [id number] DOB: 09/03/1956 DOA: 07/31/2021  PCP: Kelton Pillar, MD  Admit date: 07/31/2021 Discharge date: 08/03/2021  Admitted From: Home Disposition:  Home   Recommendations for Outpatient Follow-up:  Follow up with PCP in 1 week Follow up with GI  Repeat CBC and BMP in 1 week to check Hgb and Cr level   Discharge Condition: Stable CODE STATUS: Full  Diet recommendation: Soft   Brief/Interim Summary: Adam Quinn is a 65 year old male with past medical history of diabetes mellitus type 2, diverticulosis (last colonoscopy 04/2019), gastroesophageal reflux disease, stroke (2017, 2020) who presents to [facility] emergency department as a transfer from [place] emergency department after the patient began to experience abdominal pain and rectal bleeding.  CT angiogram of the abdomen and pelvis was performed and revealed no evidence of active bleeding but did reveal sigmoid diverticulitis.  Patient initiated on intravenous Zosyn.  Due to concerns for ongoing bleeding and possible need for blood transfusion, patient was admitted and GI consulted. Patient's hgb dropped 14.5--> 7.3 and he was given blood transfusion on 9/9. He underwent EGD which was normal.   Discharge Diagnoses:  Principal Problem:   Acute lower gastrointestinal bleeding Active Problems:   Type 2 diabetes mellitus with diabetic polyneuropathy, with long-term current use of insulin (HCC)   GERD without esophagitis   Sigmoid diverticulitis   Diabetic polyneuropathy associated with type 2 diabetes mellitus (HCC)   Rectal bleeding   Acute blood loss anemia   Hematochezia   Left upper quadrant abdominal pain   GI bleeding with acute blood loss anemia, likely due to ischemic colitis vs diverticular hemorrhage  -GI consulted, EGD 9/9 -CBC trended down from 14.5 --> 7.3. S/p 1 unit pRBC and hgb now 9.1 -Resume plavix 9/11    Acute sigmoid  diverticulitis -IV Zosyn --> Augmentin    Diabetes mellitus 2 with neuropathy -Hemoglobin A1c 5.8 -Resume home trulicity, metformin, tresiba    Gram-positive cocci in blood culture -1 of 4 bottles, likely a contaminant   Depression -Continue Zoloft  Mild renal insuffiencey -AKI ruled out -Encouraged PO hydration at home     Discharge Instructions  Discharge Instructions     Diet - low sodium heart healthy   Complete by: As directed    Discharge instructions   Complete by: As directed    RESUME PLAVIX 9/11   Increase activity slowly   Complete by: As directed       Allergies as of 08/03/2021       Reactions   Aspirin Anaphylaxis, Hives   Shrimp [shellfish Allergy] Hives   Just shrimp        Medication List     STOP taking these medications    ibuprofen 800 MG tablet Commonly known as: ADVIL       TAKE these medications    amoxicillin-clavulanate 875-125 MG tablet Commonly known as: AUGMENTIN Take 1 tablet by mouth every 12 (twelve) hours for 5 days.   clopidogrel 75 MG tablet Commonly known as: PLAVIX Take 1 tablet (75 mg total) by mouth daily.   dorzolamide 2 % ophthalmic solution Commonly known as: TRUSOPT INSTILL 1 DROP IN BOTH EYES TWICE A DAY   FreeStyle Libre 2 Reader Kerrin Mo USE AS DIRECTED FOR CONTINUOUS BLOOD SUGAR MONITORING   gabapentin 300 MG capsule Commonly known as: NEURONTIN TAKE 1 CAPSULE BY MOUTH ONCE DAILY   lansoprazole 30 MG capsule Commonly known as: PREVACID TAKE 1 CAPSULE BY  MOUTH TWICE DAILY   loratadine-pseudoephedrine 5-120 MG tablet Commonly known as: CLARITIN-D 12-hour Take 1 tablet by mouth at bedtime as needed for allergies. Alavert-D   Lumigan 0.01 % Soln Generic drug: bimatoprost INSTILL 1 DROP INTO BOTH EYES AT BEDTIME   Melatonin 10 MG Tabs Take 10 mg by mouth at bedtime.   metFORMIN 500 MG 24 hr tablet Commonly known as: GLUCOPHAGE-XR TAKE 4 TABLETS BY MOUTH EVERY NIGHT AT BEDTIME What changed:   how much to take how to take this when to take this   ondansetron 4 MG disintegrating tablet Commonly known as: Zofran ODT Take 1 tablet (4 mg total) by mouth every 8 (eight) hours as needed for nausea or vomiting.   RA PROBIOTIC GUMMIES PO Take 1 capsule by mouth at bedtime.   sertraline 100 MG tablet Commonly known as: ZOLOFT Take 1 tablet by mouth once daily   sodium bicarbonate 650 MG tablet TAKE 1 TABLET BY MOUTH TWICE DAILY   sodium bicarbonate 650 MG tablet Take 1 tablet by mouth twice a day 90 days   Tresiba FlexTouch 200 UNIT/ML FlexTouch Pen Generic drug: insulin degludec Inject 0-30 Units into the skin as needed. Based on Sliding Scale patient has devised. Will Give insulin if BG is reading [id number] or more   Trulicity 1.5 0000000 Sopn Generic drug: Dulaglutide Inject under the skin once a week What changed:  how much to take how to take this when to take this additional instructions        Follow-up Information     Kelton Pillar, MD. Schedule an appointment as soon as possible for a visit in 1 week(s).   Specialty: Family Medicine Contact information: [address] [place] [address] [phone]         Mauri Pole, MD Follow up.   Specialty: Gastroenterology Contact information: [place] [address] [phone]                Allergies  Allergen Reactions   Aspirin Anaphylaxis and Hives   Shrimp [Shellfish Allergy] Hives    Just shrimp     Consultations: GI    Procedures/Studies: CT ANGIO GI BLEED  Result Date: 07/31/2021 CLINICAL DATA:  Question diverticular bleed. EXAM: CTA ABDOMEN AND PELVIS WITHOUT AND WITH CONTRAST TECHNIQUE: Multidetector CT imaging of the abdomen and pelvis was performed using the standard protocol during bolus administration of intravenous contrast. Multiplanar reconstructed images and MIPs were obtained and reviewed to evaluate the vascular anatomy.  CONTRAST:  140m OMNIPAQUE IOHEXOL 350 MG/ML SOLN COMPARISON:  None. FINDINGS: VASCULAR Aorta: Normal caliber aorta without aneurysm, dissection, vasculitis or significant stenosis. Celiac: Patent without evidence of aneurysm, dissection, vasculitis or significant stenosis. SMA: Patent without evidence of aneurysm, dissection, vasculitis or significant stenosis. Renals: Both renal arteries are patent without evidence of aneurysm, dissection, vasculitis, fibromuscular dysplasia or significant stenosis. IMA: Patent without evidence of aneurysm, dissection, vasculitis or significant stenosis. Inflow: Patent without evidence of aneurysm, dissection, vasculitis or significant stenosis. Proximal Outflow: Bilateral common femoral and visualized portions of the superficial and profunda femoral arteries are patent without evidence of aneurysm, dissection, vasculitis or significant stenosis. Veins: No obvious venous abnormality within the limitations of this arterial phase study. Review of the MIP images confirms the above findings. NON-VASCULAR Lower chest: No acute abnormality. Hepatobiliary: No focal liver abnormality is seen. No gallstones, gallbladder wall thickening, or biliary dilatation. Pancreas: There is a 6 mm hypodensity in the head of the pancreas  image 6/51. The pancreas otherwise appears within normal limits. There is no surrounding inflammatory change or ductal dilatation. Spleen: Normal in size without focal abnormality. Adrenals/Urinary Tract: Adrenal glands are unremarkable. There is a rounded hypodensity in the right kidney which is too small to characterize, likely a cyst. Kidneys are otherwise normal, without renal calculi, focal lesion, or hydronephrosis. Bladder is unremarkable. Stomach/Bowel: There is no evidence for bowel obstruction. There is diffuse colonic diverticulosis. There is mild wall thickening and inflammation of the proximal sigmoid colon worrisome for acute diverticulitis. There is no  evidence for perforation or abscess. The appendix is within normal limits. No dilated bowel loops are identified. Portal venous images there is some small areas of likely wall enhancement in jejunal loops images 12/31 through 35. There is no definite luminal arterial bleeding seen throughout small bowel, colon or stomach. There is no free air. Lymphatic: No significant vascular findings are present. No enlarged abdominal or pelvic lymph nodes. Reproductive: Prostate is unremarkable. Other: There is a small umbilical hernia containing mesenteric fat and nondilated small bowel. There is no ascites. Musculoskeletal: No acute or significant osseous findings. IMPRESSION: VASCULAR 1. No definite intraluminal arterial bleeding within the stomach, small bowel or colon. There is some mild enhancement within the wall of jejunal loops on the portal venous phase only. This is of uncertain certain clinical significance. If there is high clinical concern for acute GI bleed consider repeat study as clinically warranted. NON-VASCULAR 1. Mild uncomplicated sigmoid colon diverticulitis. 2. 6 mm hypodense lesion in the head of the pancreas, unchanged. If patient is asymptomatic, recommend follow-up MRI in 12 months. Electronically Signed   By: Ronney Asters M.D.   On: 07/31/2021 18:12       Discharge Exam: Vitals:   08/03/21 0600 08/03/21 0745  BP: 132/76 137/72  Pulse: 88 100  Resp: 11 15  Temp:  97.7 F (36.5 C)  SpO2: 100% 93%    General: Pt is alert, awake, not in acute distress Cardiovascular: S1/S2 +, no edema Respiratory: CTA bilaterally, no wheezing, no rhonchi, no respiratory distress, no conversational dyspnea  Abdominal: Soft, NT, ND, bowel sounds + Extremities: no edema, no cyanosis Psych: Normal mood and affect, stable judgement and insight     The results of significant diagnostics from this hospitalization (including imaging, microbiology, ancillary and laboratory) are listed below for reference.      Microbiology: Recent Results (from the past 240 hour(s))  Culture, blood (routine x 2)     Status: None (Preliminary result)   Collection Time: 07/31/21  9:44 PM   Specimen: BLOOD LEFT FOREARM  Result Value Ref Range Status   Specimen Description   Final    BLOOD LEFT FOREARM Performed at [facility], [address] [place], [place] [address]    Special Requests   Final    BOTTLES DRAWN AEROBIC AND ANAEROBIC Blood Culture adequate volume Performed at [facility], [address] [place], [place] [address]    Culture   Final    NO GROWTH < 24 HOURS Performed at [facility], [place] [address], [place], [place] [address]    Report Status PENDING  Incomplete  Resp Panel by RT-PCR (Flu A&B, Covid) Nasopharyngeal Swab     Status: None   Collection Time: 07/31/21  9:44 PM   Specimen: Nasopharyngeal Swab; Nasopharyngeal(NP) swabs in vial transport medium  Result Value Ref Range Status   SARS Coronavirus 2 by RT PCR NEGATIVE NEGATIVE  Final    Comment: (NOTE) SARS-CoV-2 target nucleic acids are NOT DETECTED.  The SARS-CoV-2 RNA is generally detectable in upper respiratory specimens during the acute phase of infection. The lowest concentration of SARS-CoV-2 viral copies this assay can detect is 138 copies/mL. A negative result does not preclude SARS-Cov-2 infection and should not be used as the sole basis for treatment or other patient management decisions. A negative result may occur with  improper specimen collection/handling, submission of specimen other than nasopharyngeal swab, presence of viral mutation(s) within the areas targeted by this assay, and inadequate number of viral copies(<138 copies/mL). A negative result must be combined with clinical observations, patient history, and epidemiological information. The expected result is Negative.  Fact Sheet for Patients:  EntrepreneurPulse.com.au  Fact Sheet for Healthcare  Providers:  IncredibleEmployment.be  This test is no t yet approved or cleared by the [place] FDA and  has been authorized for detection and/or diagnosis of SARS-CoV-2 by FDA under an Emergency Use Authorization (EUA). This EUA will remain  in effect (meaning this test can be used) for the duration of the COVID-19 declaration under Section 564(b)(1) of the Act, 21 U.S.C.section 360bbb-3(b)(1), unless the authorization is terminated  or revoked sooner.       Influenza A by PCR NEGATIVE NEGATIVE Final   Influenza B by PCR NEGATIVE NEGATIVE Final    Comment: (NOTE) The Xpert Xpress SARS-CoV-2/FLU/RSV plus assay is intended as an aid in the diagnosis of influenza from Nasopharyngeal swab specimens and should not be used as a sole basis for treatment. Nasal washings and aspirates are unacceptable for Xpert Xpress SARS-CoV-2/FLU/RSV testing.  Fact Sheet for Patients: EntrepreneurPulse.com.au  Fact Sheet for Healthcare Providers: IncredibleEmployment.be  This test is not yet approved or cleared by the [place] FDA and has been authorized for detection and/or diagnosis of SARS-CoV-2 by FDA under an Emergency Use Authorization (EUA). This EUA will remain in effect (meaning this test can be used) for the duration of the COVID-19 declaration under Section 564(b)(1) of the Act, 21 U.S.C. section 360bbb-3(b)(1), unless the authorization is terminated or revoked.  Performed at [facility], [address] [place], [place] [address]   Culture, blood (routine x 2)     Status: None (Preliminary result)   Collection Time: 07/31/21  9:45 PM   Specimen: BLOOD  Result Value Ref Range Status   Specimen Description   Final    BLOOD LEFT ANTECUBITAL Performed at [facility], [address] [place], [place] [address]    Special Requests   Final    BOTTLES DRAWN AEROBIC AND ANAEROBIC Blood Culture adequate  volume Performed at [facility], [address] [place], [place] [address]    Culture  Setup Time   Final    GRAM POSITIVE COCCI AEROBIC BOTTLE ONLY CRITICAL RESULT CALLED TO, READ BACK BY AND VERIFIED WITH: PHARMD NATHEN BATCHELDER 08/02/21'@7'$ :12 BY TW    Culture   Final    NO GROWTH < 24 HOURS Performed at [facility], [place] [address] [place], [place] [address]    Report Status PENDING  Incomplete  Blood Culture ID Panel (Reflexed)     Status: Abnormal   Collection Time: 07/31/21  9:45 PM  Result Value Ref Range Status   Enterococcus faecalis NOT DETECTED NOT DETECTED Final   Enterococcus Faecium NOT DETECTED NOT DETECTED Final   Listeria monocytogenes NOT DETECTED NOT DETECTED Final   Staphylococcus species DETECTED (A)  NOT DETECTED Final    Comment: CRITICAL RESULT CALLED TO, READ BACK BY AND VERIFIED WITH: PHARMD NATHEN BATCHELDER 08/02/21'@7'$ :12 BY TW    Staphylococcus aureus (BCID) NOT DETECTED NOT DETECTED Final   Staphylococcus epidermidis NOT DETECTED NOT DETECTED Final   Staphylococcus lugdunensis NOT DETECTED NOT DETECTED Final   Streptococcus species NOT DETECTED NOT DETECTED Final   Streptococcus agalactiae NOT DETECTED NOT DETECTED Final   Streptococcus pneumoniae NOT DETECTED NOT DETECTED Final   Streptococcus pyogenes NOT DETECTED NOT DETECTED Final   A.calcoaceticus-baumannii NOT DETECTED NOT DETECTED Final   Bacteroides fragilis NOT DETECTED NOT DETECTED Final   Enterobacterales NOT DETECTED NOT DETECTED Final   Enterobacter cloacae complex NOT DETECTED NOT DETECTED Final   Escherichia coli NOT DETECTED NOT DETECTED Final   Klebsiella aerogenes NOT DETECTED NOT DETECTED Final   Klebsiella oxytoca NOT DETECTED NOT DETECTED Final   Klebsiella pneumoniae NOT DETECTED NOT DETECTED Final   Proteus species NOT DETECTED NOT DETECTED Final   Salmonella species NOT DETECTED NOT DETECTED Final   Serratia marcescens NOT DETECTED NOT DETECTED Final    Haemophilus influenzae NOT DETECTED NOT DETECTED Final   Neisseria meningitidis NOT DETECTED NOT DETECTED Final   Pseudomonas aeruginosa NOT DETECTED NOT DETECTED Final   Stenotrophomonas maltophilia NOT DETECTED NOT DETECTED Final   Candida albicans NOT DETECTED NOT DETECTED Final   Candida auris NOT DETECTED NOT DETECTED Final   Candida glabrata NOT DETECTED NOT DETECTED Final   Candida krusei NOT DETECTED NOT DETECTED Final   Candida parapsilosis NOT DETECTED NOT DETECTED Final   Candida tropicalis NOT DETECTED NOT DETECTED Final   Cryptococcus neoformans/gattii NOT DETECTED NOT DETECTED Final    Comment: Performed at [facility], [address], [place], [place] [address]  MRSA Next Gen by PCR, Nasal     Status: None   Collection Time: 08/01/21  8:40 AM   Specimen: Nasal Mucosa; Nasal Swab  Result Value Ref Range Status   MRSA by PCR Next Gen NOT DETECTED NOT DETECTED Final    Comment: (NOTE) The GeneXpert MRSA Assay (FDA approved for NASAL specimens only), is one component of a comprehensive MRSA colonization surveillance program. It is not intended to diagnose MRSA infection nor to guide or monitor treatment for MRSA infections. Test performance is not FDA approved in patients less than 64 years old. Performed at [facility], [place] [address] [place], [place] [address]      Labs: BNP (last 3 results) No results for input(s): BNP in the last 8760 hours. Basic Metabolic Panel: Recent Labs  Lab 07/31/21 1552 08/01/21 0828 08/02/21 0224 08/03/21 0238  NA 138 138 140 145  K 4.5 4.6 4.2 4.3  CL 104 101 105 109  CO2 '25 26 29 30  '$ GLUCOSE 121* 115* 105* 107*  BUN '20 18 15 13  '$ CREATININE 1.28* 1.23 1.43* 1.57*  CALCIUM 9.0 8.8* 8.8* 9.4  MG  --  1.6* 2.0  --    Liver Function Tests: Recent Labs  Lab 07/31/21 1552 08/01/21 0828  AST 22 17  ALT 23 21  ALKPHOS 75 64  BILITOT 0.3 0.6  PROT 6.3* 5.8*  ALBUMIN 3.8 3.4*   No results for  input(s): LIPASE, AMYLASE in the last 168 hours. No results for input(s): AMMONIA in the last 168 hours. CBC: Recent Labs  Lab 07/31/21 1552 07/31/21 1939 08/01/21 0828 08/01/21 1204 08/01/21 1940 08/02/21 0711 08/03/21 0238  WBC 10.8*  --  7.1 6.8 5.8 5.3  5.9  NEUTROABS 8.2*  --   --   --   --   --   --   HGB 12.1*   < > 8.9* 8.6* 8.2* 7.3* 9.1*  HCT 35.8*   < > 25.9* 25.0* 24.0* 21.4* 26.8*  MCV 95.0  --  94.5 94.0 94.5 96.0 95.0  PLT 285  --  228 202 204 183 191   < > = values in this interval not displayed.   Cardiac Enzymes: No results for input(s): CKTOTAL, CKMB, CKMBINDEX, TROPONINI in the last 168 hours. BNP: Invalid input(s): POCBNP CBG: Recent Labs  Lab 08/02/21 0747 08/02/21 1115 08/02/21 1638 08/02/21 2152 08/03/21 0746  GLUCAP 127* 111* 111* 122* 124*   D-Dimer No results for input(s): DDIMER in the last 72 hours. Hgb A1c Recent Labs    08/01/21 0828  HGBA1C 5.8*   Lipid Profile No results for input(s): CHOL, HDL, LDLCALC, TRIG, CHOLHDL, LDLDIRECT in the last 72 hours. Thyroid function studies No results for input(s): TSH, T4TOTAL, T3FREE, THYROIDAB in the last 72 hours.  Invalid input(s): FREET3 Anemia work up Recent Labs    08/02/21 0224  FERRITIN 64  TIBC 283  IRON 64   Urinalysis    Component Value Date/Time   COLORURINE YELLOW 09/01/2020 [place] 09/01/2020 1443   LABSPEC 1.025 09/01/2020 1443   PHURINE 6.0 09/01/2020 1443   GLUCOSEU NEGATIVE 09/01/2020 1443   HGBUR TRACE (A) 09/01/2020 1443   BILIRUBINUR SMALL (A) 09/01/2020 1443   KETONESUR 15 (A) 09/01/2020 1443   PROTEINUR 30 (A) 09/01/2020 1443   UROBILINOGEN 0.2 04/18/2009 1518   NITRITE NEGATIVE 09/01/2020 1443   LEUKOCYTESUR NEGATIVE 09/01/2020 1443   Sepsis Labs Invalid input(s): PROCALCITONIN,  WBC,  LACTICIDVEN Microbiology Recent Results (from the past 240 hour(s))  Culture, blood (routine x 2)     Status: None (Preliminary result)   Collection  Time: 07/31/21  9:44 PM   Specimen: BLOOD LEFT FOREARM  Result Value Ref Range Status   Specimen Description   Final    BLOOD LEFT FOREARM Performed at [facility], [address] [place], [place] [address]    Special Requests   Final    BOTTLES DRAWN AEROBIC AND ANAEROBIC Blood Culture adequate volume Performed at [facility], [address] [place], [place] [address]    Culture   Final    NO GROWTH < 24 HOURS Performed at [facility], [place] [address], [place], [place] [address]    Report Status PENDING  Incomplete  Resp Panel by RT-PCR (Flu A&B, Covid) Nasopharyngeal Swab     Status: None   Collection Time: 07/31/21  9:44 PM   Specimen: Nasopharyngeal Swab; Nasopharyngeal(NP) swabs in vial transport medium  Result Value Ref Range Status   SARS Coronavirus 2 by RT PCR NEGATIVE NEGATIVE Final    Comment: (NOTE) SARS-CoV-2 target nucleic acids are NOT DETECTED.  The SARS-CoV-2 RNA is generally detectable in upper respiratory specimens during the acute phase of infection. The lowest concentration of SARS-CoV-2 viral copies this assay can detect is 138 copies/mL. A negative result does not preclude SARS-Cov-2 infection and should not be used as the sole basis for treatment or other patient management decisions. A negative result may occur with  improper specimen collection/handling, submission of specimen other than nasopharyngeal swab, presence of viral mutation(s) within the areas targeted by this assay, and inadequate number of viral copies(<138 copies/mL). A negative result must be  combined with clinical observations, patient history, and epidemiological information. The expected result is Negative.  Fact Sheet for Patients:  EntrepreneurPulse.com.au  Fact Sheet for Healthcare Providers:  IncredibleEmployment.be  This test is no t yet approved or cleared by the [place] FDA and  has been authorized  for detection and/or diagnosis of SARS-CoV-2 by FDA under an Emergency Use Authorization (EUA). This EUA will remain  in effect (meaning this test can be used) for the duration of the COVID-19 declaration under Section 564(b)(1) of the Act, 21 U.S.C.section 360bbb-3(b)(1), unless the authorization is terminated  or revoked sooner.       Influenza A by PCR NEGATIVE NEGATIVE Final   Influenza B by PCR NEGATIVE NEGATIVE Final    Comment: (NOTE) The Xpert Xpress SARS-CoV-2/FLU/RSV plus assay is intended as an aid in the diagnosis of influenza from Nasopharyngeal swab specimens and should not be used as a sole basis for treatment. Nasal washings and aspirates are unacceptable for Xpert Xpress SARS-CoV-2/FLU/RSV testing.  Fact Sheet for Patients: EntrepreneurPulse.com.au  Fact Sheet for Healthcare Providers: IncredibleEmployment.be  This test is not yet approved or cleared by the [place] FDA and has been authorized for detection and/or diagnosis of SARS-CoV-2 by FDA under an Emergency Use Authorization (EUA). This EUA will remain in effect (meaning this test can be used) for the duration of the COVID-19 declaration under Section 564(b)(1) of the Act, 21 U.S.C. section 360bbb-3(b)(1), unless the authorization is terminated or revoked.  Performed at [facility], [address] [place], [place] [address]   Culture, blood (routine x 2)     Status: None (Preliminary result)   Collection Time: 07/31/21  9:45 PM   Specimen: BLOOD  Result Value Ref Range Status   Specimen Description   Final    BLOOD LEFT ANTECUBITAL Performed at [facility], [address] [place], [place] [address]    Special Requests   Final    BOTTLES DRAWN AEROBIC AND ANAEROBIC Blood Culture adequate volume Performed at [facility], [address] [place], [place] [address]    Culture  Setup Time   Final    GRAM POSITIVE  COCCI AEROBIC BOTTLE ONLY CRITICAL RESULT CALLED TO, READ BACK BY AND VERIFIED WITH: PHARMD NATHEN BATCHELDER 08/02/21'@7'$ :12 BY TW    Culture   Final    NO GROWTH < 24 HOURS Performed at [facility], [place] [address] [place], [place] [address]    Report Status PENDING  Incomplete  Blood Culture ID Panel (Reflexed)     Status: Abnormal   Collection Time: 07/31/21  9:45 PM  Result Value Ref Range Status   Enterococcus faecalis NOT DETECTED NOT DETECTED Final   Enterococcus Faecium NOT DETECTED NOT DETECTED Final   Listeria monocytogenes NOT DETECTED NOT DETECTED Final   Staphylococcus species DETECTED (A) NOT DETECTED Final    Comment: CRITICAL RESULT CALLED TO, READ BACK BY AND VERIFIED WITH: PHARMD NATHEN BATCHELDER 08/02/21'@7'$ :12 BY TW    Staphylococcus aureus (BCID) NOT DETECTED NOT DETECTED Final   Staphylococcus epidermidis NOT DETECTED NOT DETECTED Final   Staphylococcus lugdunensis NOT DETECTED NOT DETECTED Final   Streptococcus species NOT DETECTED NOT DETECTED Final   Streptococcus agalactiae NOT DETECTED NOT DETECTED Final   Streptococcus pneumoniae NOT DETECTED NOT DETECTED Final   Streptococcus pyogenes NOT DETECTED NOT DETECTED Final   A.calcoaceticus-baumannii NOT DETECTED NOT DETECTED Final   Bacteroides fragilis NOT DETECTED NOT DETECTED Final   Enterobacterales  NOT DETECTED NOT DETECTED Final   Enterobacter cloacae complex NOT DETECTED NOT DETECTED Final   Escherichia coli NOT DETECTED NOT DETECTED Final   Klebsiella aerogenes NOT DETECTED NOT DETECTED Final   Klebsiella oxytoca NOT DETECTED NOT DETECTED Final   Klebsiella pneumoniae NOT DETECTED NOT DETECTED Final   Proteus species NOT DETECTED NOT DETECTED Final   Salmonella species NOT DETECTED NOT DETECTED Final   Serratia marcescens NOT DETECTED NOT DETECTED Final   Haemophilus influenzae NOT DETECTED NOT DETECTED Final   Neisseria meningitidis NOT DETECTED NOT DETECTED Final   Pseudomonas aeruginosa NOT  DETECTED NOT DETECTED Final   Stenotrophomonas maltophilia NOT DETECTED NOT DETECTED Final   Candida albicans NOT DETECTED NOT DETECTED Final   Candida auris NOT DETECTED NOT DETECTED Final   Candida glabrata NOT DETECTED NOT DETECTED Final   Candida krusei NOT DETECTED NOT DETECTED Final   Candida parapsilosis NOT DETECTED NOT DETECTED Final   Candida tropicalis NOT DETECTED NOT DETECTED Final   Cryptococcus neoformans/gattii NOT DETECTED NOT DETECTED Final    Comment: Performed at [facility], [place] [address], [place], [place] [address]  MRSA Next Gen by PCR, Nasal     Status: None   Collection Time: 08/01/21  8:40 AM   Specimen: Nasal Mucosa; Nasal Swab  Result Value Ref Range Status   MRSA by PCR Next Gen NOT DETECTED NOT DETECTED Final    Comment: (NOTE) The GeneXpert MRSA Assay (FDA approved for NASAL specimens only), is one component of a comprehensive MRSA colonization surveillance program. It is not intended to diagnose MRSA infection nor to guide or monitor treatment for MRSA infections. Test performance is not FDA approved in patients less than 34 years old. Performed at [facility], [place] [address], [place], [place] [address]      Patient was seen and examined on the day of discharge and was found to be in stable condition. Time coordinating discharge: 25 minutes including assessment and coordination of care, as well as examination of the patient.   SIGNED:  Dessa Phi, DO [facility] 08/03/2021, 8:57 AM

## 2021-08-04 LAB — CULTURE, BLOOD (ROUTINE X 2): Special Requests: ADEQUATE

## 2021-08-05 ENCOUNTER — Telehealth: Payer: Self-pay | Admitting: Gastroenterology

## 2021-08-05 LAB — TYPE AND SCREEN
ABO/RH(D): O NEG
Antibody Screen: NEGATIVE
Unit division: 0

## 2021-08-05 LAB — BPAM RBC
Blood Product Expiration Date: 202210122359
ISSUE DATE / TIME: 202209091246
Unit Type and Rh: 9500

## 2021-08-05 NOTE — Telephone Encounter (Signed)
Hi Dr. Loletha Carrow,  The patient's wife called requesting a transfer of care over to Dr. Silverio Decamp said after seeing her at the hospital they prefer he continues his care with her.    Advise on scheduling thanks

## 2021-08-05 NOTE — Telephone Encounter (Signed)
Patients wife calling to have order placed in Epic so they can go for lab work.

## 2021-08-05 NOTE — Telephone Encounter (Signed)
Ok, please schedule next available appointment.

## 2021-08-05 NOTE — Telephone Encounter (Signed)
Yes, however Dr. Silverio Decamp would have to be agreeable.  I saw this patient once for a surveillance colonoscopy in June 2020  - HD

## 2021-08-05 NOTE — Telephone Encounter (Signed)
Hi Dr. Silverio Decamp,  Would you approve this transfer?

## 2021-08-05 NOTE — Telephone Encounter (Signed)
Spoke with patient's wife, advised that patient will be due for labs on Thursday. Advised that I will give the patient a call the day before to remind him to come in. Patient's wife verbalized understanding and had no concerns at the end of the call.

## 2021-08-06 LAB — CULTURE, BLOOD (ROUTINE X 2)
Culture: NO GROWTH
Special Requests: ADEQUATE

## 2021-08-07 ENCOUNTER — Ambulatory Visit (INDEPENDENT_AMBULATORY_CARE_PROVIDER_SITE_OTHER): Payer: HMO | Admitting: Podiatry

## 2021-08-07 ENCOUNTER — Encounter: Payer: Self-pay | Admitting: Podiatry

## 2021-08-07 ENCOUNTER — Telehealth: Payer: Self-pay

## 2021-08-07 ENCOUNTER — Other Ambulatory Visit: Payer: Self-pay

## 2021-08-07 DIAGNOSIS — K625 Hemorrhage of anus and rectum: Secondary | ICD-10-CM

## 2021-08-07 DIAGNOSIS — M79674 Pain in right toe(s): Secondary | ICD-10-CM

## 2021-08-07 DIAGNOSIS — M79675 Pain in left toe(s): Secondary | ICD-10-CM | POA: Diagnosis not present

## 2021-08-07 DIAGNOSIS — D689 Coagulation defect, unspecified: Secondary | ICD-10-CM | POA: Diagnosis not present

## 2021-08-07 DIAGNOSIS — E1142 Type 2 diabetes mellitus with diabetic polyneuropathy: Secondary | ICD-10-CM

## 2021-08-07 DIAGNOSIS — K5732 Diverticulitis of large intestine without perforation or abscess without bleeding: Secondary | ICD-10-CM

## 2021-08-07 DIAGNOSIS — B351 Tinea unguium: Secondary | ICD-10-CM

## 2021-08-07 DIAGNOSIS — K922 Gastrointestinal hemorrhage, unspecified: Secondary | ICD-10-CM

## 2021-08-07 NOTE — Telephone Encounter (Signed)
Spoke with patient to remind him that he is due for repeat labs at this time. No appointment is necessary. Patient is aware that he can stop by the lab in the basement at his convenience between 7:30 AM - 5 PM tomorrow. I have provided patient with the office address. Patient verbalized understanding and had no concerns at the end of the call.

## 2021-08-07 NOTE — Progress Notes (Signed)
This patient returns to my office for at risk foot care.  This patient requires this care by a professional since this patient will be at risk due to having diabetic neuropathy anc coagulation defect.  This patient is unable to cut nails himself since the patient cannot reach his nails.These nails are painful walking and wearing shoes.  This patient presents for at risk foot care today.  General Appearance  Alert, conversant and in no acute stress.  Vascular  Dorsalis pedis and posterior tibial  pulses are palpable  bilaterally.  Capillary return is within normal limits  bilaterally. Temperature is within normal limits  bilaterally.  Neurologic  Senn-Weinstein monofilament wire test absent  bilaterally. Muscle power within normal limits bilaterally.  Nails Thick disfigured discolored nails with subungual debris  Hallux nails  B/L. No evidence of bacterial infection or drainage bilaterally.  Orthopedic  No limitations of motion  feet .  No crepitus or effusions noted.  Hammer toes 2-4  B/L. Hallux malleus  B/L.  Cavus foot  B/L.  Skin  normotropic skin with no porokeratosis noted bilaterally.  No signs of infections or ulcers noted.     Onychomycosis  Pain in right toes  Pain in left toes  Consent was obtained for treatment procedures.   Mechanical debridement of hallux nails  bilaterally performed with a nail nipper.  Filed with dremel without incident.    Return office visit    3 months                  Told patient to return for periodic foot care and evaluation due to potential at risk complications.   Gardiner Barefoot DPM

## 2021-08-07 NOTE — Telephone Encounter (Signed)
-----   Message from Yevette Edwards, RN sent at 08/02/2021  3:45 PM EDT ----- Regarding: Labs Repeat CBC on 9/15, need to enter order in epic.

## 2021-08-08 ENCOUNTER — Other Ambulatory Visit (INDEPENDENT_AMBULATORY_CARE_PROVIDER_SITE_OTHER): Payer: HMO

## 2021-08-08 DIAGNOSIS — K5732 Diverticulitis of large intestine without perforation or abscess without bleeding: Secondary | ICD-10-CM | POA: Diagnosis not present

## 2021-08-08 DIAGNOSIS — K922 Gastrointestinal hemorrhage, unspecified: Secondary | ICD-10-CM

## 2021-08-08 DIAGNOSIS — K625 Hemorrhage of anus and rectum: Secondary | ICD-10-CM | POA: Diagnosis not present

## 2021-08-08 LAB — CBC WITH DIFFERENTIAL/PLATELET
Basophils Absolute: 0.1 10*3/uL (ref 0.0–0.1)
Basophils Relative: 0.7 % (ref 0.0–3.0)
Eosinophils Absolute: 0.2 10*3/uL (ref 0.0–0.7)
Eosinophils Relative: 1.9 % (ref 0.0–5.0)
HCT: 30.8 % — ABNORMAL LOW (ref 39.0–52.0)
Hemoglobin: 10.3 g/dL — ABNORMAL LOW (ref 13.0–17.0)
Lymphocytes Relative: 15.7 % (ref 12.0–46.0)
Lymphs Abs: 1.4 10*3/uL (ref 0.7–4.0)
MCHC: 33.3 g/dL (ref 30.0–36.0)
MCV: 95.8 fl (ref 78.0–100.0)
Monocytes Absolute: 0.7 10*3/uL (ref 0.1–1.0)
Monocytes Relative: 7.5 % (ref 3.0–12.0)
Neutro Abs: 6.7 10*3/uL (ref 1.4–7.7)
Neutrophils Relative %: 74.2 % (ref 43.0–77.0)
Platelets: 376 10*3/uL (ref 150.0–400.0)
RBC: 3.21 Mil/uL — ABNORMAL LOW (ref 4.22–5.81)
RDW: 14.3 % (ref 11.5–15.5)
WBC: 9 10*3/uL (ref 4.0–10.5)

## 2021-08-12 ENCOUNTER — Other Ambulatory Visit (HOSPITAL_BASED_OUTPATIENT_CLINIC_OR_DEPARTMENT_OTHER): Payer: Self-pay

## 2021-08-14 ENCOUNTER — Other Ambulatory Visit (HOSPITAL_BASED_OUTPATIENT_CLINIC_OR_DEPARTMENT_OTHER): Payer: Self-pay

## 2021-08-14 DIAGNOSIS — E1121 Type 2 diabetes mellitus with diabetic nephropathy: Secondary | ICD-10-CM | POA: Diagnosis not present

## 2021-08-14 DIAGNOSIS — N179 Acute kidney failure, unspecified: Secondary | ICD-10-CM | POA: Diagnosis not present

## 2021-08-14 DIAGNOSIS — N1831 Chronic kidney disease, stage 3a: Secondary | ICD-10-CM | POA: Diagnosis not present

## 2021-08-14 DIAGNOSIS — K922 Gastrointestinal hemorrhage, unspecified: Secondary | ICD-10-CM | POA: Diagnosis not present

## 2021-08-14 DIAGNOSIS — D500 Iron deficiency anemia secondary to blood loss (chronic): Secondary | ICD-10-CM | POA: Diagnosis not present

## 2021-08-14 DIAGNOSIS — K5732 Diverticulitis of large intestine without perforation or abscess without bleeding: Secondary | ICD-10-CM | POA: Diagnosis not present

## 2021-08-14 DIAGNOSIS — R000 Tachycardia, unspecified: Secondary | ICD-10-CM | POA: Diagnosis not present

## 2021-08-14 DIAGNOSIS — Z23 Encounter for immunization: Secondary | ICD-10-CM | POA: Diagnosis not present

## 2021-08-16 ENCOUNTER — Other Ambulatory Visit (HOSPITAL_BASED_OUTPATIENT_CLINIC_OR_DEPARTMENT_OTHER): Payer: Self-pay

## 2021-08-16 MED ORDER — FREESTYLE LIBRE 2 SENSOR MISC
3 refills | Status: DC
  Filled 2021-08-16: qty 6, 84d supply, fill #0
  Filled 2021-10-22: qty 6, 84d supply, fill #1
  Filled 2022-01-27: qty 6, 84d supply, fill #2
  Filled 2022-04-10: qty 6, 84d supply, fill #3

## 2021-08-26 ENCOUNTER — Other Ambulatory Visit (HOSPITAL_BASED_OUTPATIENT_CLINIC_OR_DEPARTMENT_OTHER): Payer: Self-pay

## 2021-08-27 ENCOUNTER — Other Ambulatory Visit (HOSPITAL_BASED_OUTPATIENT_CLINIC_OR_DEPARTMENT_OTHER): Payer: Self-pay

## 2021-08-27 MED ORDER — TRESIBA FLEXTOUCH 200 UNIT/ML ~~LOC~~ SOPN
PEN_INJECTOR | SUBCUTANEOUS | 1 refills | Status: DC
  Filled 2021-08-27: qty 6, 84d supply, fill #0
  Filled 2022-03-17: qty 6, 84d supply, fill #1
  Filled 2022-06-03: qty 6, 84d supply, fill #2

## 2021-08-29 ENCOUNTER — Other Ambulatory Visit (HOSPITAL_BASED_OUTPATIENT_CLINIC_OR_DEPARTMENT_OTHER): Payer: Self-pay

## 2021-08-29 MED ORDER — GABAPENTIN 300 MG PO CAPS
300.0000 mg | ORAL_CAPSULE | Freq: Every day | ORAL | 0 refills | Status: DC
  Filled 2021-08-29: qty 90, 90d supply, fill #0

## 2021-08-29 MED ORDER — METFORMIN HCL ER 500 MG PO TB24
1000.0000 mg | ORAL_TABLET | Freq: Two times a day (BID) | ORAL | 0 refills | Status: DC
  Filled 2021-08-29: qty 360, 90d supply, fill #0

## 2021-08-30 ENCOUNTER — Ambulatory Visit: Payer: HMO | Admitting: Gastroenterology

## 2021-08-30 ENCOUNTER — Other Ambulatory Visit (HOSPITAL_BASED_OUTPATIENT_CLINIC_OR_DEPARTMENT_OTHER): Payer: Self-pay

## 2021-08-30 MED ORDER — LANSOPRAZOLE 30 MG PO CPDR
30.0000 mg | DELAYED_RELEASE_CAPSULE | Freq: Two times a day (BID) | ORAL | 0 refills | Status: DC
  Filled 2021-08-30: qty 180, 90d supply, fill #0

## 2021-09-10 ENCOUNTER — Other Ambulatory Visit: Payer: Self-pay | Admitting: Adult Health

## 2021-09-10 ENCOUNTER — Other Ambulatory Visit (HOSPITAL_BASED_OUTPATIENT_CLINIC_OR_DEPARTMENT_OTHER): Payer: Self-pay

## 2021-09-10 DIAGNOSIS — I6381 Other cerebral infarction due to occlusion or stenosis of small artery: Secondary | ICD-10-CM

## 2021-09-10 MED ORDER — CLOPIDOGREL BISULFATE 75 MG PO TABS
75.0000 mg | ORAL_TABLET | Freq: Every day | ORAL | 1 refills | Status: DC
  Filled 2021-09-10: qty 90, 90d supply, fill #0
  Filled 2021-12-09: qty 90, 90d supply, fill #1

## 2021-09-18 ENCOUNTER — Other Ambulatory Visit (HOSPITAL_BASED_OUTPATIENT_CLINIC_OR_DEPARTMENT_OTHER): Payer: Self-pay

## 2021-09-18 DIAGNOSIS — E1121 Type 2 diabetes mellitus with diabetic nephropathy: Secondary | ICD-10-CM | POA: Diagnosis not present

## 2021-09-18 DIAGNOSIS — E11319 Type 2 diabetes mellitus with unspecified diabetic retinopathy without macular edema: Secondary | ICD-10-CM | POA: Diagnosis not present

## 2021-09-18 DIAGNOSIS — D509 Iron deficiency anemia, unspecified: Secondary | ICD-10-CM | POA: Diagnosis not present

## 2021-09-18 DIAGNOSIS — Z0000 Encounter for general adult medical examination without abnormal findings: Secondary | ICD-10-CM | POA: Diagnosis not present

## 2021-09-18 DIAGNOSIS — Z125 Encounter for screening for malignant neoplasm of prostate: Secondary | ICD-10-CM | POA: Diagnosis not present

## 2021-09-18 DIAGNOSIS — E785 Hyperlipidemia, unspecified: Secondary | ICD-10-CM | POA: Diagnosis not present

## 2021-09-18 DIAGNOSIS — S40812A Abrasion of left upper arm, initial encounter: Secondary | ICD-10-CM | POA: Diagnosis not present

## 2021-09-18 DIAGNOSIS — E291 Testicular hypofunction: Secondary | ICD-10-CM | POA: Diagnosis not present

## 2021-09-18 DIAGNOSIS — I639 Cerebral infarction, unspecified: Secondary | ICD-10-CM | POA: Diagnosis not present

## 2021-09-18 DIAGNOSIS — I6930 Unspecified sequelae of cerebral infarction: Secondary | ICD-10-CM | POA: Diagnosis not present

## 2021-09-18 DIAGNOSIS — K5732 Diverticulitis of large intestine without perforation or abscess without bleeding: Secondary | ICD-10-CM | POA: Diagnosis not present

## 2021-09-18 DIAGNOSIS — N1831 Chronic kidney disease, stage 3a: Secondary | ICD-10-CM | POA: Diagnosis not present

## 2021-09-18 DIAGNOSIS — D500 Iron deficiency anemia secondary to blood loss (chronic): Secondary | ICD-10-CM | POA: Diagnosis not present

## 2021-09-18 MED ORDER — SIMVASTATIN 20 MG PO TABS
20.0000 mg | ORAL_TABLET | Freq: Every day | ORAL | 3 refills | Status: DC
  Filled 2021-09-18: qty 90, 90d supply, fill #0
  Filled 2021-12-22: qty 90, 90d supply, fill #1
  Filled 2022-03-17: qty 90, 90d supply, fill #2
  Filled 2022-06-06: qty 90, 90d supply, fill #3

## 2021-09-27 ENCOUNTER — Other Ambulatory Visit (HOSPITAL_BASED_OUTPATIENT_CLINIC_OR_DEPARTMENT_OTHER): Payer: Self-pay

## 2021-09-27 MED ORDER — TRULICITY 1.5 MG/0.5ML ~~LOC~~ SOAJ
SUBCUTANEOUS | 4 refills | Status: DC
  Filled 2021-09-27 (×2): qty 2, 28d supply, fill #0
  Filled 2021-10-22: qty 2, 28d supply, fill #1
  Filled 2021-11-24: qty 2, 28d supply, fill #2
  Filled 2021-12-22: qty 2, 28d supply, fill #3
  Filled 2022-01-15: qty 2, 28d supply, fill #4
  Filled 2022-02-17: qty 2, 28d supply, fill #5
  Filled 2022-03-13: qty 2, 28d supply, fill #6
  Filled 2022-04-18: qty 2, 28d supply, fill #7
  Filled 2022-05-19: qty 2, 28d supply, fill #8
  Filled 2022-06-06 – 2022-06-09 (×2): qty 2, 28d supply, fill #9
  Filled 2022-07-09: qty 2, 28d supply, fill #10
  Filled 2022-08-06: qty 2, 28d supply, fill #11
  Filled 2022-09-05: qty 2, 28d supply, fill #12

## 2021-09-30 ENCOUNTER — Other Ambulatory Visit (HOSPITAL_BASED_OUTPATIENT_CLINIC_OR_DEPARTMENT_OTHER): Payer: Self-pay

## 2021-10-01 ENCOUNTER — Other Ambulatory Visit: Payer: HMO

## 2021-10-01 ENCOUNTER — Telehealth: Payer: Self-pay

## 2021-10-01 ENCOUNTER — Encounter: Payer: Self-pay | Admitting: Gastroenterology

## 2021-10-01 ENCOUNTER — Ambulatory Visit: Payer: HMO | Admitting: Gastroenterology

## 2021-10-01 ENCOUNTER — Other Ambulatory Visit (HOSPITAL_BASED_OUTPATIENT_CLINIC_OR_DEPARTMENT_OTHER): Payer: Self-pay

## 2021-10-01 ENCOUNTER — Telehealth: Payer: Self-pay | Admitting: *Deleted

## 2021-10-01 ENCOUNTER — Other Ambulatory Visit (INDEPENDENT_AMBULATORY_CARE_PROVIDER_SITE_OTHER): Payer: HMO

## 2021-10-01 VITALS — BP 118/76 | HR 94 | Ht 68.0 in | Wt 168.4 lb

## 2021-10-01 DIAGNOSIS — Z86010 Personal history of colonic polyps: Secondary | ICD-10-CM

## 2021-10-01 DIAGNOSIS — K625 Hemorrhage of anus and rectum: Secondary | ICD-10-CM | POA: Diagnosis not present

## 2021-10-01 DIAGNOSIS — Z8719 Personal history of other diseases of the digestive system: Secondary | ICD-10-CM

## 2021-10-01 LAB — CBC WITH DIFFERENTIAL/PLATELET
Basophils Absolute: 0 10*3/uL (ref 0.0–0.1)
Basophils Relative: 0.7 % (ref 0.0–3.0)
Eosinophils Absolute: 0.2 10*3/uL (ref 0.0–0.7)
Eosinophils Relative: 2.8 % (ref 0.0–5.0)
HCT: 34.2 % — ABNORMAL LOW (ref 39.0–52.0)
Hemoglobin: 11.3 g/dL — ABNORMAL LOW (ref 13.0–17.0)
Lymphocytes Relative: 21.3 % (ref 12.0–46.0)
Lymphs Abs: 1.2 10*3/uL (ref 0.7–4.0)
MCHC: 33 g/dL (ref 30.0–36.0)
MCV: 84.4 fl (ref 78.0–100.0)
Monocytes Absolute: 0.5 10*3/uL (ref 0.1–1.0)
Monocytes Relative: 8 % (ref 3.0–12.0)
Neutro Abs: 3.9 10*3/uL (ref 1.4–7.7)
Neutrophils Relative %: 67.2 % (ref 43.0–77.0)
Platelets: 231 10*3/uL (ref 150.0–400.0)
RBC: 4.05 Mil/uL — ABNORMAL LOW (ref 4.22–5.81)
RDW: 14.7 % (ref 11.5–15.5)
WBC: 5.8 10*3/uL (ref 4.0–10.5)

## 2021-10-01 MED FILL — Continuous Glucose System Receiver: 1 days supply | Qty: 1 | Fill #0 | Status: AC

## 2021-10-01 NOTE — Telephone Encounter (Signed)
-----   Message from Mauri Pole, MD sent at 10/01/2021  3:24 PM EST ----- Hemoglobin and hematocrit are improving, please continue to monitor for any signs of bleeding.

## 2021-10-01 NOTE — Progress Notes (Signed)
LEGEND TUMMINELLO    [id number]    October 19, 1956  Primary Care Physician:Griffin, Margaretha Sheffield, MD  Referring Physician: Kelton Pillar, MD [facility] [employer] [address] [place],  [place] [address]   Chief complaint: History of diverticulitis, hematochezia HPI:  65 year old very pleasant gentleman with history of type 2 diabetes, CVA on chronic antiplatelet therapy with Plavix, was recently hospitalized in September with acute GI hemorrhage/hematochezia He did not have any further episodes of rectal bleeding since he was discharged from the hospital.  He continues to have intermittent left lower quadrant abdominal discomfort.  CT angio abdomen and pelvis during hospitalization showed evidence of sigmoid diverticulitis otherwise unremarkable exam EGD was negative for any acute upper GI blood loss source  Colonoscopy in June 2020 showed diverticulosis, was inadequate prep.  He was recommended to repeat colonoscopy in 6 months  Colonoscopy July 2016 with removal of 15 mm anything angulated cecal polyp and diverticulosis  Outpatient Encounter Medications as of 10/01/2021  Medication Sig   clopidogrel (PLAVIX) 75 MG tablet Take 1 tablet (75 mg total) by mouth daily.   Continuous Blood Gluc Receiver (FREESTYLE LIBRE 2 READER) DEVI USE AS DIRECTED FOR CONTINUOUS BLOOD SUGAR MONITORING   Continuous Blood Gluc Sensor (FREESTYLE LIBRE 2 SENSOR) MISC Apply sensor to the body as directed every 2 weeks to read blood sugar continuously   dorzolamide (TRUSOPT) 2 % ophthalmic solution INSTILL 1 DROP IN BOTH EYES TWICE A DAY   Dulaglutide (TRULICITY) 1.5 JI/9.6VE SOPN Inject under the skin once a week   gabapentin (NEURONTIN) 300 MG capsule TAKE 1 CAPSULE BY MOUTH ONCE DAILY   insulin degludec (TRESIBA FLEXTOUCH) 200 UNIT/ML FlexTouch Pen Inject 50 units under the skin 1 - 2 times per week   lansoprazole (PREVACID) 30 MG capsule TAKE 1 CAPSULE BY MOUTH TWICE DAILY   loratadine-pseudoephedrine  (CLARITIN-D 12-HOUR) 5-120 MG tablet Take 1 tablet by mouth at bedtime as needed for allergies. Alavert-D   LUMIGAN 0.01 % SOLN INSTILL 1 DROP INTO BOTH EYES AT BEDTIME   Melatonin 10 MG TABS Take 10 mg by mouth at bedtime.   metFORMIN (GLUCOPHAGE-XR) 500 MG 24 hr tablet Take 2 tablets by mouth twice a day   ondansetron (ZOFRAN ODT) 4 MG disintegrating tablet Take 1 tablet (4 mg total) by mouth every 8 (eight) hours as needed for nausea or vomiting.   Probiotic Product (RA PROBIOTIC GUMMIES PO) Take 1 capsule by mouth at bedtime.   sertraline (ZOLOFT) 100 MG tablet Take 1 tablet by mouth once daily   simvastatin (ZOCOR) 20 MG tablet Take 1 tablet (20 mg total) by mouth daily.   sodium bicarbonate 650 MG tablet TAKE 1 TABLET BY MOUTH TWICE DAILY   sodium bicarbonate 650 MG tablet Take 1 tablet by mouth twice a day 90 days   TRESIBA FLEXTOUCH 200 UNIT/ML FlexTouch Pen Inject 0-30 Units into the skin as needed. Based on Sliding Scale patient has devised. Will Give insulin if BG is reading 150 or more   [DISCONTINUED] gabapentin (NEURONTIN) 300 MG capsule TAKE 1 CAPSULE BY MOUTH ONCE DAILY   [DISCONTINUED] metFORMIN (GLUCOPHAGE-XR) 500 MG 24 hr tablet TAKE 4 TABLETS BY MOUTH EVERY NIGHT AT BEDTIME (Patient taking differently: Take 1,000 mg by mouth 2 (two) times daily.)   No facility-administered encounter medications on file as of 10/01/2021.    Allergies as of 10/01/2021 - Review Complete 10/01/2021  Allergen Reaction Noted   Aspirin Anaphylaxis and Hives 03/16/2008  Shrimp [shellfish allergy] Hives 05/21/2015    Past Medical History:  Diagnosis Date   Allergy    seasonal   Anxiety    Cataract    cataract surgery bilaterally resolved issues   Central scotoma    Chronic kidney disease    small protien showing uses Lisinopril for this   Diverticulitis    Diverticulosis    DM (diabetes mellitus) ([place])    GERD (gastroesophageal reflux disease)    Glaucoma    History of colon polyps     History of hiatal hernia    History of kidney stones    x1   Iron deficiency anemia    Iron infusion 04/29/2019   Neuropathy    OSA on CPAP    Stroke ([place])    Wears glasses    READING    Past Surgical History:  Procedure Laterality Date   cataract surgery     bilateral   COLONOSCOPY     COLONOSCOPY N/A 06/04/2015   Procedure: COLONOSCOPY;  Surgeon: Inda Castle, MD;  Location: [facility];  Service: Endoscopy;  Laterality: N/A;   COLONOSCOPY WITH PROPOFOL N/A 05/09/2019   Procedure: COLONOSCOPY WITH PROPOFOL;  Surgeon: Doran Stabler, MD;  Location: [facility] END[facility]COPY;  Service: Gastroenterology;  Laterality: N/A;   ESOPHAGOGASTRODUODENOSCOPY N/A 06/04/2015   Procedure: ESOPHAGOGASTRODUODENOSCOPY (EGD);  Surgeon: Inda Castle, MD;  Location: [facility];  Service: Endoscopy;  Laterality: N/A;   ESOPHAGOGASTRODUODENOSCOPY (EGD) WITH PROPOFOL N/A 08/02/2021   Procedure: ESOPHAGOGASTRODUODENOSCOPY (EGD) WITH PROPOFOL;  Surgeon: Mauri Pole, MD;  Location: [facility];  Service: Endoscopy;  Laterality: N/A;   UPPER GASTROINTESTINAL ENDOSCOPY     WISDOM TOOTH EXTRACTION     with sedation    Family History  Problem Relation Age of Onset   Colon polyps Father    Lung cancer Father    Diabetes Mother    Dementia Mother    Diabetes Sister        x 2   Stroke Maternal Grandmother    Stroke Paternal Grandfather    Colon cancer Neg Hx    Rectal cancer Neg Hx    Stomach cancer Neg Hx    Pancreatic cancer Neg Hx     Social History   Socioeconomic History   Marital status: Married    Spouse name: Not on file   Number of children: 3   Years of education: Not on file   Highest education level: Not on file  Occupational History   Occupation: Optometrist  Tobacco Use   Smoking status: Former    Types: Cigarettes    Quit date: 10/04/1997    Years since quitting: 24.0   Smokeless tobacco: Never  Vaping Use   Vaping Use: Never used  Substance and Sexual Activity    Alcohol use: No   Drug use: No   Sexual activity: Not on file  Other Topics Concern   Not on file  Social History Narrative   Not on file   Social Determinants of Health   Financial Resource Strain: Not on file  Food Insecurity: Not on file  Transportation Needs: Not on file  Physical Activity: Not on file  Stress: Not on file  Social Connections: Not on file  Intimate Partner Violence: Not on file      Review of systems: All other review of systems negative except as mentioned in the HPI.   Physical Exam: Vitals:   10/01/21 1058  BP: 118/76  Pulse: 94  Body mass index is 25.6 kg/m. Gen:      No acute distress HEENT:  sclera anicteric Abd:      soft, non-tender; no palpable masses, no distension Ext:    No edema Neuro: alert and oriented x 3 Psych: normal mood and affect  Data Reviewed:  Reviewed labs, radiology imaging, old records and pertinent past GI work up   Assessment and Plan/Recommendations:  65 year old very pleasant gentleman with history of type 2 diabetes, OSA on CPAP, history of CVA on chronic antiplatelet therapy with Plavix He was recently hospitalized in September 2022 with acute GI hemorrhage/hematochezia, likely etiology diverticular hemorrhage.  CT angio abdomen pelvis showed features of acute sigmoid diverticulitis otherwise was unremarkable  Follow-up CBC Advised patient to monitor for any signs of bleeding.  He denies any overt rectal bleeding or melena.  He has history of colon polyps, is past due for surveillance colonoscopy We will plan to schedule colonoscopy today  We will request clearance from Dr. Leonie Man to hold Plavix for 5 days prior to the procedure  Return after colonoscopy as needed  This visit required >40 minutes of patient care (this includes precharting, chart review, review of results, face-to-face time used for counseling as well as treatment plan and follow-up. The patient was provided an opportunity to ask  questions and all were answered. The patient agreed with the plan and demonstrated an understanding of the instructions.  Damaris Hippo , MD    CC: Kelton Pillar, MD

## 2021-10-01 NOTE — Telephone Encounter (Signed)
.  Pre-operative Risk Assessment     Request for surgical clearance:     Endoscopy Procedure  What type of surgery is being performed?     colonoscopy  When is this surgery scheduled?     TBD Patint is going o call back to schedule  What type of clearance is required ?   Pharmacy  Are there any medications that need to be held prior to surgery and how long? Plavix 5 days   Practice name and name of physician performing surgery?      [facility]  What is your office phone and fax number?      Phone- [phone]  [phone]  Anesthesia type (None, local, MAC, general) ?       MAC   MAC

## 2021-10-01 NOTE — Patient Instructions (Signed)
Your provider has requested that you go to the basement level for lab work before leaving today. Press "B" on the elevator. The lab is located at the first door on the left as you exit the elevator.   It has been recommended to you by your physician that you have a(n) Colonoscopy completed. Per your request, we did not schedule the procedure(s) today. Please contact our office at [phone] should you decide to have the procedure completed. You will be scheduled for a pre-visit and procedure at that time     Due to recent changes in healthcare laws, you may see the results of your imaging and laboratory studies on MyChart before your provider has had a chance to review them.  We understand that in some cases there may be results that are confusing or concerning to you. Not all laboratory results come back in the same time frame and the provider may be waiting for multiple results in order to interpret others.  Please give [place] 48 hours in order for your provider to thoroughly review all the results before contacting the office for clarification of your results.    If you are age 69 or older, your body mass index should be between 23-30. Your Body mass index is 25.6 kg/m. If this is out of the aforementioned range listed, please consider follow up with your Primary Care Provider.  If you are age 22 or younger, your body mass index should be between 19-25. Your Body mass index is 25.6 kg/m. If this is out of the aformentioned range listed, please consider follow up with your Primary Care Provider.   ________________________________________________________  The [facility] providers would like to encourage you to use [facility] to communicate with providers for non-urgent requests or questions.  Due to long hold times on the telephone, sending your provider a message by [facility] may be a faster and more efficient way to get a response.  Please allow 48 business hours for a response.  Please remember that this  is for non-urgent requests.  _______________________________________________________   I appreciate the  opportunity to care for you  Thank You   Harl Bowie , MD .

## 2021-10-01 NOTE — Telephone Encounter (Signed)
Left message for patient to please call back. 

## 2021-10-02 NOTE — Telephone Encounter (Signed)
Patient cleared to undergo colonoscopy as no recurrent stroke since 2020 with holding Plavix 5 days per request with small but acceptable risk of recurrent stroke while off therapy.  Recommend restarting Plavix immediately after or once hemodynamically stable.

## 2021-10-02 NOTE — Telephone Encounter (Signed)
Patient advised of the lab results. Patient states he cannot do the colonoscopy until February.

## 2021-10-02 NOTE — Telephone Encounter (Signed)
Inbound call from pt returning a nurse phone call.

## 2021-10-02 NOTE — Telephone Encounter (Signed)
Dr Darleen Crocker on patients Plavix. But he has not called [place] back to schedule yet

## 2021-10-02 NOTE — Telephone Encounter (Signed)
Thanks Beth, We already have the Plavix clearance Dr Silverio Decamp will he need to come back into the office in Feb to schedule and redo plavix authorization?

## 2021-10-02 NOTE — Telephone Encounter (Signed)
Got it, thanks!

## 2021-10-07 ENCOUNTER — Encounter: Payer: Self-pay | Admitting: Family

## 2021-10-07 ENCOUNTER — Other Ambulatory Visit (HOSPITAL_BASED_OUTPATIENT_CLINIC_OR_DEPARTMENT_OTHER): Payer: Self-pay

## 2021-10-14 ENCOUNTER — Other Ambulatory Visit (HOSPITAL_BASED_OUTPATIENT_CLINIC_OR_DEPARTMENT_OTHER): Payer: Self-pay

## 2021-10-14 DIAGNOSIS — H401134 Primary open-angle glaucoma, bilateral, indeterminate stage: Secondary | ICD-10-CM | POA: Diagnosis not present

## 2021-10-14 DIAGNOSIS — B0052 Herpesviral keratitis: Secondary | ICD-10-CM | POA: Diagnosis not present

## 2021-10-14 DIAGNOSIS — Z961 Presence of intraocular lens: Secondary | ICD-10-CM | POA: Diagnosis not present

## 2021-10-14 DIAGNOSIS — E1165 Type 2 diabetes mellitus with hyperglycemia: Secondary | ICD-10-CM | POA: Diagnosis not present

## 2021-10-14 MED ORDER — DORZOLAMIDE HCL 2 % OP SOLN
OPHTHALMIC | 2 refills | Status: DC
  Filled 2021-10-14: qty 10, 50d supply, fill #0
  Filled 2021-10-15: qty 10, fill #0
  Filled 2021-10-21: qty 10, 50d supply, fill #0
  Filled 2021-11-25 (×2): qty 10, 50d supply, fill #1
  Filled ????-??-?? (×2): fill #1

## 2021-10-14 MED ORDER — LUMIGAN 0.01 % OP SOLN
1.0000 [drp] | Freq: Every day | OPHTHALMIC | 2 refills | Status: DC
  Filled 2021-10-14: qty 2.5, 25d supply, fill #0
  Filled 2021-11-25 (×2): qty 2.5, 25d supply, fill #1
  Filled 2021-12-30: qty 2.5, 25d supply, fill #2

## 2021-10-14 MED ORDER — VALACYCLOVIR HCL 500 MG PO TABS
500.0000 mg | ORAL_TABLET | Freq: Three times a day (TID) | ORAL | 0 refills | Status: DC
  Filled 2021-10-14: qty 30, 10d supply, fill #0

## 2021-10-14 MED ORDER — CIPROFLOXACIN HCL 0.3 % OP SOLN
OPHTHALMIC | 0 refills | Status: DC
  Filled 2021-10-14: qty 5, 25d supply, fill #0

## 2021-10-15 ENCOUNTER — Other Ambulatory Visit (HOSPITAL_BASED_OUTPATIENT_CLINIC_OR_DEPARTMENT_OTHER): Payer: Self-pay

## 2021-10-21 ENCOUNTER — Other Ambulatory Visit (HOSPITAL_BASED_OUTPATIENT_CLINIC_OR_DEPARTMENT_OTHER): Payer: Self-pay

## 2021-10-22 ENCOUNTER — Other Ambulatory Visit (HOSPITAL_BASED_OUTPATIENT_CLINIC_OR_DEPARTMENT_OTHER): Payer: Self-pay

## 2021-10-22 MED ORDER — SERTRALINE HCL 100 MG PO TABS
100.0000 mg | ORAL_TABLET | Freq: Every day | ORAL | 1 refills | Status: DC
  Filled 2021-10-22: qty 90, 90d supply, fill #0
  Filled 2022-01-15: qty 90, 90d supply, fill #1

## 2021-10-24 DIAGNOSIS — B0052 Herpesviral keratitis: Secondary | ICD-10-CM | POA: Diagnosis not present

## 2021-10-24 DIAGNOSIS — H18593 Other hereditary corneal dystrophies, bilateral: Secondary | ICD-10-CM | POA: Diagnosis not present

## 2021-10-24 DIAGNOSIS — H401134 Primary open-angle glaucoma, bilateral, indeterminate stage: Secondary | ICD-10-CM | POA: Diagnosis not present

## 2021-11-11 ENCOUNTER — Other Ambulatory Visit: Payer: Self-pay

## 2021-11-11 ENCOUNTER — Ambulatory Visit: Payer: HMO | Admitting: Podiatry

## 2021-11-11 ENCOUNTER — Encounter: Payer: Self-pay | Admitting: Podiatry

## 2021-11-11 DIAGNOSIS — E1142 Type 2 diabetes mellitus with diabetic polyneuropathy: Secondary | ICD-10-CM

## 2021-11-11 DIAGNOSIS — D689 Coagulation defect, unspecified: Secondary | ICD-10-CM | POA: Diagnosis not present

## 2021-11-11 DIAGNOSIS — M79674 Pain in right toe(s): Secondary | ICD-10-CM

## 2021-11-11 DIAGNOSIS — B351 Tinea unguium: Secondary | ICD-10-CM

## 2021-11-11 DIAGNOSIS — M79675 Pain in left toe(s): Secondary | ICD-10-CM

## 2021-11-11 NOTE — Progress Notes (Signed)
This patient returns to my office for at risk foot care.  This patient requires this care by a professional since this patient will be at risk due to having diabetic neuropathy anc coagulation defect.  This patient is unable to cut nails himself since the patient cannot reach his nails.These nails are painful walking and wearing shoes.  This patient presents for at risk foot care today.  General Appearance  Alert, conversant and in no acute stress.  Vascular  Dorsalis pedis and posterior tibial  pulses are palpable  bilaterally.  Capillary return is within normal limits  bilaterally. Temperature is within normal limits  bilaterally.  Neurologic  Senn-Weinstein monofilament wire test absent  bilaterally. Muscle power within normal limits bilaterally.  Nails Thick disfigured discolored nails with subungual debris  Hallux nails  B/L. No evidence of bacterial infection or drainage bilaterally.  Orthopedic  No limitations of motion  feet .  No crepitus or effusions noted.  Hammer toes 2-4  B/L. Hallux malleus  B/L.  Cavus foot  B/L.  Skin  normotropic skin with no porokeratosis noted bilaterally.  No signs of infections or ulcers noted.     Onychomycosis  Pain in right toes  Pain in left toes  Consent was obtained for treatment procedures.   Mechanical debridement of hallux nails  bilaterally performed with a nail nipper.  Filed with dremel without incident. Patient requests diabetic neuropathy evaluation.   Return office visit    3 months                  Told patient to return for periodic foot care and evaluation due to potential at risk complications.   Gardiner Barefoot DPM

## 2021-11-14 ENCOUNTER — Other Ambulatory Visit (HOSPITAL_COMMUNITY): Payer: Self-pay

## 2021-11-14 MED ORDER — CARESTART COVID-19 HOME TEST VI KIT
PACK | 0 refills | Status: DC
Start: 2021-11-14 — End: 2023-11-02
  Filled 2021-11-14: qty 4, 4d supply, fill #0
  Filled 2022-05-11: qty 4, 8d supply, fill #0
  Filled 2022-05-26: qty 4, 2d supply, fill #0
  Filled 2022-06-03: qty 4, 4d supply, fill #0

## 2021-11-21 ENCOUNTER — Telehealth: Payer: Self-pay

## 2021-11-21 NOTE — Telephone Encounter (Signed)
Patient had declined to schedule the colonoscopy at his office visit.  Following up to see if he is ready to schedule. Left the patient a message with this information and my name. Asked he call us back to discuss. He will need a hospital procedure.

## 2021-11-25 ENCOUNTER — Encounter: Payer: Self-pay | Admitting: Family

## 2021-11-25 ENCOUNTER — Other Ambulatory Visit (HOSPITAL_BASED_OUTPATIENT_CLINIC_OR_DEPARTMENT_OTHER): Payer: Self-pay

## 2021-11-26 ENCOUNTER — Other Ambulatory Visit: Payer: Self-pay

## 2021-11-26 ENCOUNTER — Other Ambulatory Visit (HOSPITAL_BASED_OUTPATIENT_CLINIC_OR_DEPARTMENT_OTHER): Payer: Self-pay

## 2021-11-26 ENCOUNTER — Encounter: Payer: Self-pay | Admitting: Family

## 2021-11-26 DIAGNOSIS — Z86010 Personal history of colonic polyps: Secondary | ICD-10-CM

## 2021-11-26 MED ORDER — METFORMIN HCL ER 500 MG PO TB24
1000.0000 mg | ORAL_TABLET | Freq: Two times a day (BID) | ORAL | 0 refills | Status: DC
  Filled 2021-11-26: qty 360, 90d supply, fill #0

## 2021-11-26 MED ORDER — LANSOPRAZOLE 30 MG PO CPDR
30.0000 mg | DELAYED_RELEASE_CAPSULE | Freq: Two times a day (BID) | ORAL | 0 refills | Status: DC
  Filled 2021-11-26: qty 180, 90d supply, fill #0

## 2021-11-26 NOTE — Telephone Encounter (Signed)
Colonoscopy will be 01/30/22. Patient accept this date. Instructions mailed to the patient.

## 2021-11-29 ENCOUNTER — Ambulatory Visit: Payer: HMO | Admitting: Podiatry

## 2021-11-29 ENCOUNTER — Other Ambulatory Visit: Payer: Self-pay

## 2021-11-29 DIAGNOSIS — Q667 Congenital pes cavus, unspecified foot: Secondary | ICD-10-CM | POA: Diagnosis not present

## 2021-12-04 NOTE — Progress Notes (Signed)
Subjective:  Patient ID: Adam Quinn, male    DOB: [birth year],  MRN: [id number]  Chief Complaint  Patient presents with   Peripheral Neuropathy    66 y.o. male presents with the above complaint.  Patient presents with concern for high arched foot structure with underlying peripheral neuropathy.  Patient states that he is taking gabapentin which seems to help.  He wanted to know if there is any other treatment for peripheral neuropathy.  He is known to Dr. Sharyon Cable for routine foot care.  He also does not wear any kind of orthotics to support the arch of his foot.  He has not seen anyone else prior to seeing me.  He would like to discuss treatment options for this.   Review of Systems: Negative except as noted in the HPI. Denies N/V/F/Ch.  Past Medical History:  Diagnosis Date   Allergy    seasonal   Anxiety    Cataract    cataract surgery bilaterally resolved issues   Central scotoma    Chronic kidney disease    small protien showing uses Lisinopril for this   Diverticulitis    Diverticulosis    DM (diabetes mellitus) ([place])    GERD (gastroesophageal reflux disease)    Glaucoma    History of colon polyps    History of hiatal hernia    History of kidney stones    x1   Iron deficiency anemia    Iron infusion 04/29/2019   Neuropathy    OSA on CPAP    Stroke ([facility]CC)    Wears glasses    READING    Current Outpatient Medications:    ciprofloxacin (CILOXAN) 0.3 % ophthalmic solution, Place 1 drop into the left eye 4 times daily, Disp: 5 mL, Rfl: 0   clopidogrel (PLAVIX) 75 MG tablet, Take 1 tablet (75 mg total) by mouth daily., Disp: 90 tablet, Rfl: 1   Continuous Blood Gluc Receiver (FREESTYLE LIBRE 2 READER) DEVI, USE AS DIRECTED FOR CONTINUOUS BLOOD SUGAR MONITORING, Disp: 1 each, Rfl: 3   Continuous Blood Gluc Sensor (FREESTYLE LIBRE 2 SENSOR) MISC, Apply sensor to the body as directed every 2 weeks to read blood sugar continuously, Disp: 6 each, Rfl: 3   COVID-19 At Home  Antigen Test (CARESTART COVID-19 HOME TEST) KIT, Use as directed, Disp: 4 each, Rfl: 0   dorzolamide (TRUSOPT) 2 % ophthalmic solution, PLACE 1 DROP IN BOTH EYES TWICE A DAY, Disp: 10 mL, Rfl: 2   Dulaglutide (TRULICITY) 1.5 NK/5.3ZJ SOPN, Inject under the skin once a week, Disp: 6 mL, Rfl: 4   gabapentin (NEURONTIN) 300 MG capsule, TAKE 1 CAPSULE BY MOUTH ONCE DAILY, Disp: 90 capsule, Rfl: 0   insulin degludec (TRESIBA FLEXTOUCH) 200 UNIT/ML FlexTouch Pen, Inject 50 units under the skin 1 - 2 times per week, Disp: 9 mL, Rfl: 1   lansoprazole (PREVACID) 30 MG capsule, TAKE 1 CAPSULE BY MOUTH TWICE DAILY, Disp: 180 capsule, Rfl: 0   loratadine-pseudoephedrine (CLARITIN-D 12-HOUR) 5-120 MG tablet, Take 1 tablet by mouth at bedtime as needed for allergies. Alavert-D, Disp: , Rfl:    LUMIGAN 0.01 % SOLN, PLACE 1 DROP INTO BOTH EYES AT BEDTIME, Disp: 2.5 mL, Rfl: 2   Melatonin 10 MG TABS, Take 10 mg by mouth at bedtime., Disp: , Rfl:    metFORMIN (GLUCOPHAGE-XR) 500 MG 24 hr tablet, Take 2 tablets by mouth twice a day, Disp: 360 tablet, Rfl: 0   ondansetron (ZOFRAN ODT) 4 MG disintegrating tablet,  Take 1 tablet (4 mg total) by mouth every 8 (eight) hours as needed for nausea or vomiting., Disp: 20 tablet, Rfl: 0   Probiotic Product (RA PROBIOTIC GUMMIES PO), Take 1 capsule by mouth at bedtime., Disp: , Rfl:    sertraline (ZOLOFT) 100 MG tablet, Take 1 tablet by mouth once daily, Disp: 90 tablet, Rfl: 1   simvastatin (ZOCOR) 20 MG tablet, Take 1 tablet (20 mg total) by mouth daily., Disp: 90 tablet, Rfl: 3   sodium bicarbonate 650 MG tablet, TAKE 1 TABLET BY MOUTH TWICE DAILY, Disp: 180 tablet, Rfl: 1   sodium bicarbonate 650 MG tablet, Take 1 tablet by mouth twice a day 90 days, Disp: 180 tablet, Rfl: 2   TRESIBA FLEXTOUCH 200 UNIT/ML FlexTouch Pen, Inject 0-30 Units into the skin as needed. Based on Sliding Scale patient has devised. Will Give insulin if BG is reading 150 or more, Disp: , Rfl:     valACYclovir (VALTREX) 500 MG tablet, Take 1 tablet by mouth three times a day, Disp: 30 tablet, Rfl: 0  Social History   Tobacco Use  Smoking Status Former   Types: Cigarettes   Quit date: 10/04/1997   Years since quitting: 24.1  Smokeless Tobacco Never    Allergies  Allergen Reactions   Aspirin Anaphylaxis and Hives   Shrimp [Shellfish Allergy] Hives    Just shrimp    Objective:  There were no vitals filed for this visit. There is no height or weight on file to calculate BMI. Constitutional Well developed. Well nourished.  Vascular Dorsalis pedis pulses palpable bilaterally. Posterior tibial pulses palpable bilaterally. Capillary refill normal to all digits.  No cyanosis or clubbing noted. Pedal hair growth normal.  Neurologic Normal speech. Oriented to person, place, and time. Epicritic sensation to light touch grossly present bilaterally.  Dermatologic Nails well groomed and normal in appearance. No open wounds. No skin lesions.  Orthopedic: Gait examination shows pes cavus foot structure with calcaneal varus.  Coleman block test helps reduce some of the deformity.  Anterior cavus noted with forefoot pressure.  No open sores or lesions noted.  No calluses noted.   Radiographs: None Assessment:   1. Pes cavus    Plan:  Patient was evaluated and treated and all questions answered.  Pes cavus -I explained to patient the etiology of pes cavus and relationship with high arch and various treatment options were discussed.  Given patient foot structure in the setting of high arch pain I believe patient will benefit from custom-made orthotics to help control the hindfoot motion support the arch of the foot and take the stress away from arches.  Patient agrees with the plan like to proceed with orthotics -Patient was casted for orthotics   No follow-ups on file.

## 2021-12-10 ENCOUNTER — Other Ambulatory Visit (HOSPITAL_BASED_OUTPATIENT_CLINIC_OR_DEPARTMENT_OTHER): Payer: Self-pay

## 2021-12-11 ENCOUNTER — Other Ambulatory Visit (HOSPITAL_BASED_OUTPATIENT_CLINIC_OR_DEPARTMENT_OTHER): Payer: Self-pay

## 2021-12-11 MED ORDER — GABAPENTIN 300 MG PO CAPS
300.0000 mg | ORAL_CAPSULE | Freq: Every day | ORAL | 0 refills | Status: DC
  Filled 2021-12-11: qty 90, 90d supply, fill #0

## 2021-12-12 ENCOUNTER — Other Ambulatory Visit (HOSPITAL_BASED_OUTPATIENT_CLINIC_OR_DEPARTMENT_OTHER): Payer: Self-pay

## 2021-12-12 MED ORDER — INSULIN PEN NEEDLE 31G X 5 MM MISC
1 refills | Status: DC
  Filled 2021-12-12: qty 100, 90d supply, fill #0
  Filled 2022-06-03: qty 100, 90d supply, fill #1

## 2021-12-13 DIAGNOSIS — G4733 Obstructive sleep apnea (adult) (pediatric): Secondary | ICD-10-CM | POA: Diagnosis not present

## 2021-12-23 ENCOUNTER — Other Ambulatory Visit (HOSPITAL_BASED_OUTPATIENT_CLINIC_OR_DEPARTMENT_OTHER): Payer: Self-pay

## 2021-12-25 ENCOUNTER — Telehealth: Payer: Self-pay | Admitting: Podiatry

## 2021-12-25 NOTE — Telephone Encounter (Signed)
Orthotics in.. lvm for pt to call to schedule an appt to pick them up. °

## 2021-12-25 NOTE — Telephone Encounter (Signed)
Received HTA auth # [id number] for orthtoics(L3020 X2) valid 1.31.2023 thru 5.1.2023.Marland Kitchen

## 2021-12-30 ENCOUNTER — Other Ambulatory Visit (HOSPITAL_BASED_OUTPATIENT_CLINIC_OR_DEPARTMENT_OTHER): Payer: Self-pay

## 2022-01-09 ENCOUNTER — Other Ambulatory Visit (HOSPITAL_BASED_OUTPATIENT_CLINIC_OR_DEPARTMENT_OTHER): Payer: Self-pay

## 2022-01-09 DIAGNOSIS — Z961 Presence of intraocular lens: Secondary | ICD-10-CM | POA: Diagnosis not present

## 2022-01-09 DIAGNOSIS — E119 Type 2 diabetes mellitus without complications: Secondary | ICD-10-CM | POA: Diagnosis not present

## 2022-01-09 DIAGNOSIS — H401134 Primary open-angle glaucoma, bilateral, indeterminate stage: Secondary | ICD-10-CM | POA: Diagnosis not present

## 2022-01-09 DIAGNOSIS — H18593 Other hereditary corneal dystrophies, bilateral: Secondary | ICD-10-CM | POA: Diagnosis not present

## 2022-01-09 MED ORDER — LUMIGAN 0.01 % OP SOLN
OPHTHALMIC | 3 refills | Status: DC
  Filled 2022-01-09 – 2022-01-27 (×2): qty 7.5, 75d supply, fill #0
  Filled 2022-05-11: qty 7.5, 75d supply, fill #1
  Filled 2022-08-06: qty 7.5, 75d supply, fill #2
  Filled 2022-10-15 (×2): qty 7.5, 75d supply, fill #3

## 2022-01-09 MED ORDER — DORZOLAMIDE HCL 2 % OP SOLN
OPHTHALMIC | 4 refills | Status: DC
  Filled 2022-01-09: qty 10, 50d supply, fill #0
  Filled 2022-03-13: qty 10, 50d supply, fill #1
  Filled 2022-04-24: qty 10, 50d supply, fill #2
  Filled 2022-06-06: qty 10, 50d supply, fill #3
  Filled 2022-07-19: qty 10, 50d supply, fill #4

## 2022-01-15 ENCOUNTER — Encounter: Payer: Self-pay | Admitting: Family

## 2022-01-15 ENCOUNTER — Other Ambulatory Visit (HOSPITAL_BASED_OUTPATIENT_CLINIC_OR_DEPARTMENT_OTHER): Payer: Self-pay

## 2022-01-22 ENCOUNTER — Encounter (HOSPITAL_COMMUNITY): Payer: Self-pay | Admitting: Gastroenterology

## 2022-01-27 ENCOUNTER — Other Ambulatory Visit (HOSPITAL_BASED_OUTPATIENT_CLINIC_OR_DEPARTMENT_OTHER): Payer: Self-pay

## 2022-01-28 ENCOUNTER — Other Ambulatory Visit (HOSPITAL_BASED_OUTPATIENT_CLINIC_OR_DEPARTMENT_OTHER): Payer: Self-pay

## 2022-01-30 ENCOUNTER — Ambulatory Visit (HOSPITAL_COMMUNITY): Payer: HMO | Admitting: Certified Registered Nurse Anesthetist

## 2022-01-30 ENCOUNTER — Encounter (HOSPITAL_COMMUNITY): Payer: Self-pay | Admitting: Gastroenterology

## 2022-01-30 ENCOUNTER — Ambulatory Visit (HOSPITAL_COMMUNITY)
Admission: RE | Admit: 2022-01-30 | Discharge: 2022-01-30 | Disposition: A | Discharge status: Home / Self Care | Payer: HMO | Attending: Gastroenterology | Admitting: Gastroenterology

## 2022-01-30 ENCOUNTER — Encounter (HOSPITAL_COMMUNITY)
Admission: RE | Disposition: A | Discharge status: Home / Self Care | Payer: Self-pay | Source: Home / Self Care | Attending: Gastroenterology

## 2022-01-30 ENCOUNTER — Ambulatory Visit (HOSPITAL_BASED_OUTPATIENT_CLINIC_OR_DEPARTMENT_OTHER): Payer: HMO | Admitting: Certified Registered Nurse Anesthetist

## 2022-01-30 ENCOUNTER — Other Ambulatory Visit: Payer: Self-pay

## 2022-01-30 DIAGNOSIS — Z09 Encounter for follow-up examination after completed treatment for conditions other than malignant neoplasm: Secondary | ICD-10-CM | POA: Diagnosis present

## 2022-01-30 DIAGNOSIS — Z87891 Personal history of nicotine dependence: Secondary | ICD-10-CM | POA: Insufficient documentation

## 2022-01-30 DIAGNOSIS — I10 Essential (primary) hypertension: Secondary | ICD-10-CM | POA: Diagnosis not present

## 2022-01-30 DIAGNOSIS — Z8673 Personal history of transient ischemic attack (TIA), and cerebral infarction without residual deficits: Secondary | ICD-10-CM | POA: Diagnosis not present

## 2022-01-30 DIAGNOSIS — K621 Rectal polyp: Secondary | ICD-10-CM

## 2022-01-30 DIAGNOSIS — E1122 Type 2 diabetes mellitus with diabetic chronic kidney disease: Secondary | ICD-10-CM | POA: Diagnosis not present

## 2022-01-30 DIAGNOSIS — I129 Hypertensive chronic kidney disease with stage 1 through stage 4 chronic kidney disease, or unspecified chronic kidney disease: Secondary | ICD-10-CM | POA: Diagnosis not present

## 2022-01-30 DIAGNOSIS — K5730 Diverticulosis of large intestine without perforation or abscess without bleeding: Secondary | ICD-10-CM | POA: Diagnosis not present

## 2022-01-30 DIAGNOSIS — N189 Chronic kidney disease, unspecified: Secondary | ICD-10-CM | POA: Diagnosis not present

## 2022-01-30 DIAGNOSIS — K648 Other hemorrhoids: Secondary | ICD-10-CM | POA: Diagnosis not present

## 2022-01-30 DIAGNOSIS — G4730 Sleep apnea, unspecified: Secondary | ICD-10-CM | POA: Diagnosis not present

## 2022-01-30 DIAGNOSIS — K644 Residual hemorrhoidal skin tags: Secondary | ICD-10-CM | POA: Diagnosis not present

## 2022-01-30 DIAGNOSIS — K219 Gastro-esophageal reflux disease without esophagitis: Secondary | ICD-10-CM | POA: Diagnosis not present

## 2022-01-30 DIAGNOSIS — K635 Polyp of colon: Secondary | ICD-10-CM | POA: Diagnosis not present

## 2022-01-30 DIAGNOSIS — Z794 Long term (current) use of insulin: Secondary | ICD-10-CM | POA: Insufficient documentation

## 2022-01-30 DIAGNOSIS — Z79899 Other long term (current) drug therapy: Secondary | ICD-10-CM | POA: Diagnosis not present

## 2022-01-30 DIAGNOSIS — Z860100 Personal history of colon polyps, unspecified: Secondary | ICD-10-CM

## 2022-01-30 DIAGNOSIS — D122 Benign neoplasm of ascending colon: Secondary | ICD-10-CM | POA: Diagnosis not present

## 2022-01-30 HISTORY — PX: COLONOSCOPY WITH PROPOFOL: SHX5780

## 2022-01-30 HISTORY — PX: POLYPECTOMY: SHX5525

## 2022-01-30 LAB — GLUCOSE, CAPILLARY: Glucose-Capillary: 136 mg/dL — ABNORMAL HIGH (ref 70–99)

## 2022-01-30 SURGERY — COLONOSCOPY WITH PROPOFOL
Anesthesia: Monitor Anesthesia Care

## 2022-01-30 MED ORDER — PROPOFOL 500 MG/50ML IV EMUL
INTRAVENOUS | Status: DC | PRN
Start: 2022-01-30 — End: 2022-01-30
  Administered 2022-01-30: 120 ug/kg/min via INTRAVENOUS

## 2022-01-30 MED ORDER — PROPOFOL 10 MG/ML IV BOLUS
INTRAVENOUS | Status: DC | PRN
  Administered 2022-01-30: 50 mg via INTRAVENOUS
  Administered 2022-01-30 (×2): 20 mg via INTRAVENOUS

## 2022-01-30 MED ORDER — SODIUM CHLORIDE 0.9 % IV SOLN: INTRAVENOUS | Status: DC

## 2022-01-30 MED ORDER — LACTATED RINGERS IV SOLN: INTRAVENOUS | Status: DC

## 2022-01-30 MED ORDER — PROPOFOL 500 MG/50ML IV EMUL
INTRAVENOUS | Status: AC
  Filled 2022-01-30: qty 50

## 2022-01-30 MED ORDER — LIDOCAINE HCL (CARDIAC) PF 100 MG/5ML IV SOSY
PREFILLED_SYRINGE | INTRAVENOUS | Status: DC | PRN
  Administered 2022-01-30: 100 mg via INTRAVENOUS

## 2022-01-30 MED ORDER — FLEET ENEMA 7-19 GM/118ML RE ENEM
ENEMA | RECTAL | Status: AC
  Filled 2022-01-30: qty 1

## 2022-01-30 SURGICAL SUPPLY — 22 items

## 2022-01-30 NOTE — Transfer of Care (Signed)
Immediate Anesthesia Transfer of Care Note ? ?Patient: Adam Quinn ? ?Procedure(s) Performed: COLONOSCOPY WITH PROPOFOL ?POLYPECTOMY ? ?Patient Location: Endoscopy Unit ? ?Anesthesia Type:MAC ? ?Level of Consciousness: drowsy ? ?Airway & Oxygen Therapy: Patient Spontanous Breathing ? ?Post-op Assessment: Report given to RN and Post -op Vital signs reviewed and stable ? ?Post vital signs: Reviewed and stable ? ?Last Vitals:  ?Vitals Value Taken Time  ?BP 110/68 01/30/22 1138  ?Temp 37.1 ?C 01/30/22 1138  ?Pulse 87 01/30/22 1138  ?Resp 14 01/30/22 1138  ?SpO2 95 % 01/30/22 1138  ?Vitals shown include unvalidated device data. ? ?Last Pain:  ?Vitals:  ? 01/30/22 1138  ?TempSrc: Tympanic  ?PainSc: Asleep  ?   ? ?  ? ?Complications: No notable events documented. ?

## 2022-01-30 NOTE — Op Note (Addendum)
[facility] ?Patient Name: Adam Quinn ?Procedure Date: 01/30/2022 ?MRN: [id number] ?Attending MD: Mauri Pole , MD ?Date of Birth: 1955/12/26 ?CSN: [id number] ?Age: 66 ?Admit Type: Outpatient ?Procedure:                Colonoscopy ?Indications:              High risk colon cancer surveillance: Personal  ?                          history of colonic polyps, High risk colon cancer  ?                          surveillance: Personal history of adenoma (10 mm or  ?                          greater in size) ?Providers:                Mauri Pole, MD, Ladoris Gene, RN,  ?                          William Dalton, Technician ?Referring MD:              ?Medicines:                Monitored Anesthesia Care ?Complications:            No immediate complications. ?Estimated Blood Loss:     Estimated blood loss was minimal. ?Procedure:                Pre-Anesthesia Assessment: ?                          - Prior to the procedure, a History and Physical  ?                          was performed, and patient medications and  ?                          allergies were reviewed. The patient's tolerance of  ?                          previous anesthesia was also reviewed. The risks  ?                          and benefits of the procedure and the sedation  ?                          options and risks were discussed with the patient.  ?                          All questions were answered, and informed consent  ?                          was obtained. Prior Anticoagulants: The patient  ?  last took Plavix (clopidogrel) 5 days prior to the  ?                          procedure. ASA Grade Assessment: III - A patient  ?                          with severe systemic disease. After reviewing the  ?                          risks and benefits, the patient was deemed in  ?                          satisfactory condition to undergo the procedure. ?                          After obtaining  informed consent, the colonoscope  ?                          was passed under direct vision. Throughout the  ?                          procedure, the patient's blood pressure, pulse, and  ?                          oxygen saturations were monitored continuously. The  ?                          [id number]-HQ190L ([id number]) Olympus colonoscope was  ?                          introduced through the anus and advanced to the the  ?                          cecum, identified by appendiceal orifice and  ?                          ileocecal valve. The colonoscopy was performed  ?                          without difficulty. The patient tolerated the  ?                          procedure well. The quality of the bowel  ?                          preparation was adequate to identify polyps 6 mm  ?                          and larger in size. The ileocecal valve,  ?                          appendiceal orifice, and rectum were photographed. ?Scope In: 11:13:24 AM ?Scope Out: 11:33:29 AM ?Scope Withdrawal Time: 0 hours 12 minutes 53 seconds  ?  Total Procedure Duration: 0 hours 20 minutes 5 seconds  ?Findings: ?     The perianal and digital rectal examinations were normal. ?     A moderate amount of stool was found in the entire colon, interfering  ?     with visualization. Lavage of the area was performed, resulting in  ?     clearance with fair visualization. ?     A 12 mm polyp was found in the ascending colon. The polyp was sessile.  ?     The polyp was removed with a cold snare. Resection and retrieval were  ?     complete. ?     A 4 mm polyp was found in the rectum. The polyp was sessile. The polyp  ?     was removed with a cold snare. Resection and retrieval were complete. ?     Multiple small and large-mouthed diverticula were found in the sigmoid  ?     colon, descending colon, transverse colon, ascending colon and cecum.  ?     There was evidence of diverticular spasm. Peri-diverticular erythema was  ?     seen. There was  evidence of an impacted diverticulum. ?     Non-bleeding external and internal hemorrhoids were found during  ?     retroflexion. The hemorrhoids were medium-sized. ?Impression:               - Stool in the entire examined colon. ?                          - One 12 mm polyp in the ascending colon, removed  ?                          with a cold snare. Resected and retrieved. ?                          - One 4 mm polyp in the rectum, removed with a cold  ?                          snare. Resected and retrieved. ?                          - Severe diverticulosis in the sigmoid colon, in  ?                          the descending colon, in the transverse colon, in  ?                          the ascending colon and in the cecum. There was  ?                          evidence of diverticular spasm. Peri-diverticular  ?                          erythema was seen. There was evidence of an  ?                          impacted diverticulum. ?                          -  Non-bleeding external and internal hemorrhoids. ?Moderate Sedation: ?     Not Applicable - Patient had care per Anesthesia. ?Recommendation:           - Patient has a contact number available for  ?                          emergencies. The signs and symptoms of potential  ?                          delayed complications were discussed with the  ?                          patient. Return to normal activities tomorrow.  ?                          Written discharge instructions were provided to the  ?                          patient. ?                          - Resume previous diet. ?                          - Continue present medications. ?                          - Await pathology results. ?                          - Repeat colonoscopy in 3 years for surveillance  ?                          based on pathology results. ?                          - For future colonoscopy the patient will require  ?                          an extended preparation. If there  are any  ?                          questions, please contact the gastroenterologist. ?                          - Resume Plavix (clopidogrel) at prior dose  ?                          tomorrow. Refer to managing physician for further  ?                          adjustment of therapy. ?Procedure Code(s):        --- Professional --- ?                          [phone], Colonoscopy, flexible; with removal of  ?  tumor(s), polyp(s), or other lesion(s) by snare  ?                          technique ?Diagnosis Code(s):        --- Professional --- ?                          P71.0, Other hemorrhoids ?                          K63.5, Polyp of colon ?                          K62.1, Rectal polyp ?                          Z86.010, Personal history of colonic polyps ?                          K57.30, Diverticulosis of large intestine without  ?                          perforation or abscess without bleeding ?CPT copyright 2019 American Medical Association. All rights reserved. ?The codes documented in this report are preliminary and upon coder review may  ?be revised to meet current compliance requirements. ?Mauri Pole, MD ?01/30/2022 11:40:16 AM ?This report has been signed electronically. ?Number of Addenda: 0 ?

## 2022-01-30 NOTE — Discharge Instructions (Signed)
YOU HAD AN ENDOSCOPIC PROCEDURE TODAY: Refer to the procedure report and other information in the discharge instructions given to you for any specific questions about what was found during the examination. If this information does not answer your questions, please call [place] office at [phone] to clarify.  ° °YOU SHOULD EXPECT: Some feelings of bloating in the abdomen. Passage of more gas than usual. Walking can help get rid of the air that was put into your GI tract during the procedure and reduce the bloating. If you had a lower endoscopy (such as a colonoscopy or flexible sigmoidoscopy) you may notice spotting of blood in your stool or on the toilet paper. Some abdominal soreness may be present for a day or two, also. ° °DIET: Your first meal following the procedure should be a light meal and then it is ok to progress to your normal diet. A half-sandwich or bowl of soup is an example of a good first meal. Heavy or fried foods are harder to digest and may make you feel nauseous or bloated. Drink plenty of fluids but you should avoid alcoholic beverages for 24 hours. If you had a esophageal dilation, please see attached instructions for diet.   ° °ACTIVITY: Your care partner should take you home directly after the procedure. You should plan to take it easy, moving slowly for the rest of the day. You can resume normal activity the day after the procedure however YOU SHOULD NOT DRIVE, use power tools, machinery or perform tasks that involve climbing or major physical exertion for 24 hours (because of the sedation medicines used during the test).  ° °SYMPTOMS TO REPORT IMMEDIATELY: °A gastroenterologist can be reached at any hour. Please call [phone]  for any of the following symptoms:  °Following lower endoscopy (colonoscopy, flexible sigmoidoscopy) °Excessive amounts of blood in the stool  °Significant tenderness, worsening of abdominal pains  °Swelling of the abdomen that is new, acute  °Fever of 100° or  higher  °Following upper endoscopy (EGD, EUS, ERCP, esophageal dilation) °Vomiting of blood or coffee ground material  °New, significant abdominal pain  °New, significant chest pain or pain under the shoulder blades  °Painful or persistently difficult swallowing  °New shortness of breath  °Black, tarry-looking or red, bloody stools ° °FOLLOW UP:  °If any biopsies were taken you will be contacted by phone or by letter within the next 1-3 weeks. Call [phone]  if you have not heard about the biopsies in 3 weeks.  °Please also call with any specific questions about appointments or follow up tests. ° °

## 2022-01-30 NOTE — H&P (Signed)
[facility] History and Physical ? ? ?Primary Care Physician:  Kelton Pillar, MD ? ? ?Reason for Procedure:  History of adenomatous colon polyps ? ?Plan:    Surveillance colonoscopy with possible interventions as needed ? ? ? ? ?HPI: Adam Quinn is a very pleasant 66 y.o. male here for surveillance colonoscopy. ?Denies any nausea, vomiting, abdominal pain, melena or bright red blood per rectum ? ?The risks and benefits as well as alternatives of endoscopic procedure(s) have been discussed and reviewed. All questions answered. The patient agrees to proceed. ? ? ? ?Past Medical History:  ?Diagnosis Date  ? Allergy   ? seasonal  ? Anxiety   ? Cataract   ? cataract surgery bilaterally resolved issues  ? Central scotoma   ? Chronic kidney disease   ? small protien showing uses Lisinopril for this  ? Diverticulitis   ? Diverticulosis   ? DM (diabetes mellitus) ([place])   ? GERD (gastroesophageal reflux disease)   ? Glaucoma   ? History of colon polyps   ? History of hiatal hernia   ? History of kidney stones   ? x1  ? Iron deficiency anemia   ? Iron infusion 04/29/2019  ? Neuropathy   ? OSA on CPAP   ? Stroke [facility])   ? Wears glasses   ? READING  ? ? ?Past Surgical History:  ?Procedure Laterality Date  ? cataract surgery    ? bilateral  ? COLONOSCOPY    ? COLONOSCOPY N/A 06/04/2015  ? Procedure: COLONOSCOPY;  Surgeon: Inda Castle, MD;  Location: [facility] ENDOSCOPY;  Service: Endoscopy;  Laterality: N/A;  ? COLONOSCOPY WITH PROPOFOL N/A 05/09/2019  ? Procedure: COLONOSCOPY WITH PROPOFOL;  Surgeon: Doran Stabler, MD;  Location: [facility] ENDOSCOPY;  Service: Gastroenterology;  Laterality: N/A;  ? ESOPHAGOGASTRODUODENOSCOPY N/A 06/04/2015  ? Procedure: ESOPHAGOGASTRODUODENOSCOPY (EGD);  Surgeon: Inda Castle, MD;  Location: [facility];  Service: Endoscopy;  Laterality: N/A;  ? ESOPHAGOGASTRODUODENOSCOPY (EGD) WITH PROPOFOL N/A 08/02/2021  ? Procedure: ESOPHAGOGASTRODUODENOSCOPY (EGD) WITH PROPOFOL;  Surgeon: Mauri Pole, MD;  Location: [facility] END[facility];  Service: Endoscopy;  Laterality: N/A;  ? UPPER GASTROINTESTINAL ENDOSCOPY    ? WISDOM TOOTH EXTRACTION    ? with sedation  ? ? ?Prior to Admission medications   ?Medication Sig Start Date End Date Taking? Authorizing Provider  ?bimatoprost (LUMIGAN) 0.01 % SOLN Instill 1 drop in both eyes at bedtime 01/09/22  Yes   ?clopidogrel (PLAVIX) 75 MG tablet Take 1 tablet (75 mg total) by mouth daily. ?Patient taking differently: Take 75 mg by mouth every evening. 09/10/21  Yes Frann Rider, NP  ?dorzolamide (TRUSOPT) 2 % ophthalmic solution Instill 1 drop in both eyes twice a day 01/09/22  Yes   ?Dulaglutide (TRULICITY) 1.5 RJ/1.8AC SOPN Inject under the skin once a week ?Patient taking differently: Inject 1.5 mg into the skin every Sunday. 09/27/21  Yes   ?ELDERBERRY PO Take 1 tablet by mouth at bedtime. Gummy   Yes [provider]  ?gabapentin (NEURONTIN) 300 MG capsule TAKE 1 CAPSULE BY MOUTH ONCE DAILY ?Patient taking differently: Take 300 mg by mouth at bedtime. 12/11/21  Yes   ?lansoprazole (PREVACID) 30 MG capsule TAKE 1 CAPSULE BY MOUTH TWICE DAILY 11/26/21  Yes   ?loratadine-pseudoephedrine (ALAVERT D-12 HOUR ALLERGY/CONG) 5-120 MG tablet Take 1 tablet by mouth at bedtime.   Yes [provider]  ?Melatonin 5 MG CHEW Chew 5 mg by mouth at bedtime.   Yes [provider]  ?  metFORMIN (GLUCOPHAGE-XR) 500 MG 24 hr tablet Take 2 tablets by mouth twice a day 11/26/21  Yes   ?ondansetron (ZOFRAN ODT) 4 MG disintegrating tablet Take 1 tablet (4 mg total) by mouth every 8 (eight) hours as needed for nausea or vomiting. 09/01/20  Yes Joy, Shawn C, PA-C  ?Probiotic Product (RA PROBIOTIC GUMMIES PO) Take 1 capsule by mouth at bedtime.   Yes [provider]  ?sertraline (ZOLOFT) 100 MG tablet Take 1 tablet by mouth once daily 10/22/21  Yes   ?simvastatin (ZOCOR) 20 MG tablet Take 1 tablet (20 mg total) by mouth daily. ?Patient taking differently: Take 20 mg by  mouth every evening. 09/18/21  Yes   ?sodium bicarbonate 650 MG tablet Take 1 tablet by mouth twice a day 90 days 06/27/21  Yes   ?TRESIBA FLEXTOUCH 200 UNIT/ML FlexTouch Pen Inject 35 Units into the skin at bedtime as needed (if BG is reading 150 or more). 02/23/20  Yes [provider]  ?ciprofloxacin (CILOXAN) 0.3 % ophthalmic solution Place 1 drop into the left eye 4 times daily ?Patient not taking: Reported on 01/27/2022 10/14/21     ?Continuous Blood Gluc Sensor (FREESTYLE LIBRE 2 SENSOR) MISC Apply sensor to the body as directed every 2 weeks to read blood sugar continuously 08/16/21     ?COVID-19 At Home Antigen Test [facility] COVID-19 HOME TEST) KIT Use as directed 11/14/21   Edmon Crape, RPH  ?dorzolamide (TRUSOPT) 2 % ophthalmic solution PLACE 1 DROP IN BOTH EYES TWICE A DAY ?Patient not taking: Reported on 01/27/2022 10/14/21     ?insulin degludec (TRESIBA FLEXTOUCH) 200 UNIT/ML FlexTouch Pen Inject 50 units under the skin 1 - 2 times per week ?Patient not taking: Reported on 01/27/2022 08/27/21     ?Insulin Pen Needle 31G X 5 MM MISC Use as directed with Tyler Aas 08/27/21     ?valACYclovir (VALTREX) 500 MG tablet Take 1 tablet by mouth three times a day ?Patient not taking: Reported on 01/27/2022 10/14/21     ? ? ?Current Facility-Administered Medications  ?Medication Dose Route Frequency Provider Last Rate Last Admin  ? 0.9 %  sodium chloride infusion   Intravenous Continuous Amoria Mclees, Venia Minks, MD      ? ? ?Allergies as of 11/26/2021 - Review Complete 11/11/2021  ?Allergen Reaction Noted  ? Aspirin Anaphylaxis and Hives 03/16/2008  ? Shrimp [shellfish allergy] Hives 05/21/2015  ? ? ?Family History  ?Problem Relation Age of Onset  ? Colon polyps Father   ? Lung cancer Father   ? Diabetes Mother   ? Dementia Mother   ? Diabetes Sister   ?     x 2  ? Stroke Maternal Grandmother   ? Stroke Paternal Grandfather   ? Colon cancer Neg Hx   ? Rectal cancer Neg Hx   ? Stomach cancer Neg Hx   ? Pancreatic cancer  Neg Hx   ? ? ?Social History  ? ?Socioeconomic History  ? Marital status: Married  ?  Spouse name: Not on file  ? Number of children: 3  ? Years of education: Not on file  ? Highest education level: Not on file  ?Occupational History  ? Occupation: Optometrist  ?Tobacco Use  ? Smoking status: Former  ?  Types: Cigarettes  ?  Quit date: 10/04/1997  ?  Years since quitting: 24.3  ? Smokeless tobacco: Never  ?Vaping Use  ? Vaping Use: Never used  ?Substance and Sexual Activity  ? Alcohol use: No  ?  Drug use: No  ? Sexual activity: Not on file  ?Other Topics Concern  ? Not on file  ?Social History Narrative  ? Not on file  ? ?Social Determinants of Health  ? ?Financial Resource Strain: Not on file  ?Food Insecurity: Not on file  ?Transportation Needs: Not on file  ?Physical Activity: Not on file  ?Stress: Not on file  ?Social Connections: Not on file  ?Intimate Partner Violence: Not on file  ? ? ?Review of Systems: ? ?All other review of systems negative except as mentioned in the HPI. ? ?Physical Exam: ?Vital signs in last 24 hours: ?There were no vitals taken for this visit. ?General:   Alert, NAD ?Lungs:  Clear .   ?Heart:  Regular rate and rhythm ?Abdomen:  Soft, nontender and nondistended. ?Neuro/Psych:  Alert and cooperative. Normal mood and affect. A and O x 3 ? ?Reviewed labs, radiology imaging, old records and pertinent past GI work up ? ? ? ?K. Denzil Magnuson , MD ?[phone]  ? ? ?  ?

## 2022-01-30 NOTE — Progress Notes (Signed)
Called Mrs. Buesing, informed the results.  She was upset that she was not brought back to be with the patient in the recovery, explained to her that this was not communicated to the RN's in endoscopy unit earlier in the admitting, by the patient or when the nurse went back to get contact information from her later. ? ? ? ?She kept asking why this is not exam to check for GI bleed given he was hospitalized in September with GI bleed which was thought to be secondary to possible ischemic colitis versus diverticular hemorrhage. ?He has not had any further episodes of GI bleed or hematochezia since that admission ? ?She was confused that this was a surveillance colonoscopy due to history of polyps, said she was never informed that he ever had colon polyps.  Explained to her and reviewed all the prior colonoscopies including colonoscopy in 2016 with removal of 15 mm polyp, subsequent poor prep on colonoscopy in 2020.  All of this was already discussed during GI office visit with both patient and his wife in November 2022 prior to scheduling this procedure. ? ?She was provided opportunity to ask questions, all were answered, in the end she thanked for the phone call and is appreciative of the care ?

## 2022-01-30 NOTE — Anesthesia Postprocedure Evaluation (Signed)
Anesthesia Post Note ? ?Patient: Adam Quinn ? ?Procedure(s) Performed: COLONOSCOPY WITH PROPOFOL ?POLYPECTOMY ? ?  ? ?Patient location during evaluation: PACU ?Anesthesia Type: MAC ?Level of consciousness: awake and alert ?Pain management: pain level controlled ?Vital Signs Assessment: post-procedure vital signs reviewed and stable ?Respiratory status: spontaneous breathing, nonlabored ventilation, respiratory function stable and patient connected to nasal cannula oxygen ?Cardiovascular status: stable and blood pressure returned to baseline ?Postop Assessment: no apparent nausea or vomiting ?Anesthetic complications: no ? ? ?No notable events documented. ? ?Last Vitals:  ?Vitals:  ? 01/30/22 1138 01/30/22 1148  ?BP: 110/68 101/64  ?Pulse: 90 92  ?Resp: 14 16  ?Temp: 37.1 ?C   ?SpO2: 95% 95%  ?  ?Last Pain:  ?Vitals:  ? 01/30/22 1148  ?TempSrc:   ?PainSc: 0-No pain  ? ? ?  ?  ?  ?  ?  ?  ? ?Angelo Prindle S ? ? ? ? ?

## 2022-01-30 NOTE — Anesthesia Preprocedure Evaluation (Signed)
Anesthesia Evaluation  ?Patient identified by MRN, date of birth, ID band ?Patient awake ? ? ? ?Reviewed: ?Allergy & Precautions, NPO status , Patient's Chart, lab work & pertinent test results ? ?Airway ?Mallampati: II ? ?TM Distance: >3 FB ?Neck ROM: Full ? ? ? Dental ?no notable dental hx. ? ?  ?Pulmonary ?sleep apnea and Continuous Positive Airway Pressure Ventilation , former smoker,  ?  ?Pulmonary exam normal ?breath sounds clear to auscultation ? ? ? ? ? ? Cardiovascular ?hypertension, Pt. on medications ?Normal cardiovascular exam ?Rhythm:Regular Rate:Normal ? ? ?  ?Neuro/Psych ?CVA negative psych ROS  ? GI/Hepatic ?Neg liver ROS, GERD  ,  ?Endo/Other  ?diabetes, Insulin Dependent ? Renal/GU ?negative Renal ROS  ?negative genitourinary ?  ?Musculoskeletal ?negative musculoskeletal ROS ?(+)  ? Abdominal ?  ?Peds ?negative pediatric ROS ?(+)  Hematology ?negative hematology ROS ?(+)   ?Anesthesia Other Findings ? ? Reproductive/Obstetrics ?negative OB ROS ? ?  ? ? ? ? ? ? ? ? ? ? ? ? ? ?  ?  ? ? ? ? ? ? ? ? ?Anesthesia Physical ?Anesthesia Plan ? ?ASA: 3 ? ?Anesthesia Plan: MAC  ? ?Post-op Pain Management: Minimal or no pain anticipated  ? ?Induction: Intravenous ? ?PONV Risk Score and Plan: 1 and Propofol infusion and Treatment may vary due to age or medical condition ? ?Airway Management Planned: Simple Face Mask ? ?Additional Equipment:  ? ?Intra-op Plan:  ? ?Post-operative Plan:  ? ?Informed Consent: I have reviewed the patients History and Physical, chart, labs and discussed the procedure including the risks, benefits and alternatives for the proposed anesthesia with the patient or authorized representative who has indicated his/her understanding and acceptance.  ? ? ? ?Dental advisory given ? ?Plan Discussed with: CRNA and Surgeon ? ?Anesthesia Plan Comments:   ? ? ? ? ? ? ?Anesthesia Quick Evaluation ? ?

## 2022-01-31 LAB — SURGICAL PATHOLOGY

## 2022-02-03 ENCOUNTER — Encounter (HOSPITAL_COMMUNITY): Payer: Self-pay | Admitting: Gastroenterology

## 2022-02-05 ENCOUNTER — Encounter: Payer: Self-pay | Admitting: Gastroenterology

## 2022-02-07 ENCOUNTER — Other Ambulatory Visit (HOSPITAL_BASED_OUTPATIENT_CLINIC_OR_DEPARTMENT_OTHER): Payer: Self-pay

## 2022-02-07 MED ORDER — GABAPENTIN 300 MG PO CAPS
300.0000 mg | ORAL_CAPSULE | Freq: Every day | ORAL | 0 refills | Status: DC
  Filled 2022-02-07 – 2022-03-05 (×2): qty 90, 90d supply, fill #0

## 2022-02-11 ENCOUNTER — Ambulatory Visit: Payer: HMO | Admitting: Podiatry

## 2022-02-12 ENCOUNTER — Other Ambulatory Visit: Payer: Self-pay

## 2022-02-12 ENCOUNTER — Ambulatory Visit: Payer: HMO | Admitting: Podiatry

## 2022-02-12 ENCOUNTER — Encounter: Payer: Self-pay | Admitting: Podiatry

## 2022-02-12 DIAGNOSIS — B351 Tinea unguium: Secondary | ICD-10-CM

## 2022-02-12 DIAGNOSIS — E1142 Type 2 diabetes mellitus with diabetic polyneuropathy: Secondary | ICD-10-CM

## 2022-02-12 DIAGNOSIS — M79675 Pain in left toe(s): Secondary | ICD-10-CM | POA: Diagnosis not present

## 2022-02-12 DIAGNOSIS — M79674 Pain in right toe(s): Secondary | ICD-10-CM

## 2022-02-12 DIAGNOSIS — D689 Coagulation defect, unspecified: Secondary | ICD-10-CM

## 2022-02-12 DIAGNOSIS — Q6672 Congenital pes cavus, left foot: Secondary | ICD-10-CM | POA: Diagnosis not present

## 2022-02-12 DIAGNOSIS — Q6671 Congenital pes cavus, right foot: Secondary | ICD-10-CM | POA: Diagnosis not present

## 2022-02-12 NOTE — Progress Notes (Signed)
This patient returns to my office for at risk foot care.  This patient requires this care by a professional since this patient will be at risk due to having diabetic neuropathy anc coagulation defect.  This patient is unable to cut nails himself since the patient cannot reach his nails.These nails are painful walking and wearing shoes.  This patient presents for at risk foot care today. ? ?General Appearance  Alert, conversant and in no acute stress. ? ?Vascular  Dorsalis pedis and posterior tibial  pulses are palpable  bilaterally.  Capillary return is within normal limits  bilaterally. Temperature is within normal limits  bilaterally. ? ?Neurologic  Senn-Weinstein monofilament wire test absent  bilaterally. Muscle power within normal limits bilaterally. ? ?Nails Thick disfigured discolored nails with subungual debris  Hallux nails  B/L. No evidence of bacterial infection or drainage bilaterally. ? ?Orthopedic  No limitations of motion  feet .  No crepitus or effusions noted.  Hammer toes 2-4  B/L. Hallux malleus  B/L.  Cavus foot  B/L. ? ?Skin  normotropic skin with no porokeratosis noted bilaterally.  No signs of infections or ulcers noted.    ? ?Onychomycosis  Pain in right toes  Pain in left toes ? ?Consent was obtained for treatment procedures.   Mechanical debridement of hallux nails  bilaterally performed with a nail nipper.  Filed with dremel without incident. Patient requests diabetic neuropathy evaluation. ? ? ?Return office visit    3 months                  Told patient to return for periodic foot care and evaluation due to potential at risk complications. ? ? ?Gardiner Barefoot DPM  ?

## 2022-02-17 ENCOUNTER — Other Ambulatory Visit (HOSPITAL_BASED_OUTPATIENT_CLINIC_OR_DEPARTMENT_OTHER): Payer: Self-pay

## 2022-02-25 ENCOUNTER — Other Ambulatory Visit (HOSPITAL_BASED_OUTPATIENT_CLINIC_OR_DEPARTMENT_OTHER): Payer: Self-pay

## 2022-02-26 ENCOUNTER — Other Ambulatory Visit (HOSPITAL_BASED_OUTPATIENT_CLINIC_OR_DEPARTMENT_OTHER): Payer: Self-pay

## 2022-02-26 MED ORDER — METFORMIN HCL ER 500 MG PO TB24
1000.0000 mg | ORAL_TABLET | Freq: Two times a day (BID) | ORAL | 1 refills | Status: DC
  Filled 2022-02-26: qty 360, 90d supply, fill #0
  Filled 2022-05-26: qty 360, 90d supply, fill #1

## 2022-02-26 MED ORDER — LANSOPRAZOLE 30 MG PO CPDR
30.0000 mg | DELAYED_RELEASE_CAPSULE | Freq: Two times a day (BID) | ORAL | 1 refills | Status: DC
  Filled 2022-02-26: qty 180, 90d supply, fill #0
  Filled 2022-05-26: qty 180, 90d supply, fill #1

## 2022-03-05 ENCOUNTER — Other Ambulatory Visit: Payer: Self-pay | Admitting: Adult Health

## 2022-03-05 ENCOUNTER — Other Ambulatory Visit (HOSPITAL_BASED_OUTPATIENT_CLINIC_OR_DEPARTMENT_OTHER): Payer: Self-pay

## 2022-03-05 DIAGNOSIS — I6381 Other cerebral infarction due to occlusion or stenosis of small artery: Secondary | ICD-10-CM

## 2022-03-05 MED ORDER — CLOPIDOGREL BISULFATE 75 MG PO TABS
75.0000 mg | ORAL_TABLET | Freq: Every day | ORAL | 1 refills | Status: DC
  Filled 2022-03-05: qty 90, 90d supply, fill #0
  Filled 2022-06-03: qty 90, 90d supply, fill #1

## 2022-03-13 ENCOUNTER — Other Ambulatory Visit (HOSPITAL_BASED_OUTPATIENT_CLINIC_OR_DEPARTMENT_OTHER): Payer: Self-pay

## 2022-03-14 ENCOUNTER — Other Ambulatory Visit (HOSPITAL_BASED_OUTPATIENT_CLINIC_OR_DEPARTMENT_OTHER): Payer: Self-pay

## 2022-03-17 ENCOUNTER — Other Ambulatory Visit (HOSPITAL_BASED_OUTPATIENT_CLINIC_OR_DEPARTMENT_OTHER): Payer: Self-pay

## 2022-03-19 DIAGNOSIS — K219 Gastro-esophageal reflux disease without esophagitis: Secondary | ICD-10-CM | POA: Diagnosis not present

## 2022-03-19 DIAGNOSIS — H6122 Impacted cerumen, left ear: Secondary | ICD-10-CM | POA: Diagnosis not present

## 2022-03-19 DIAGNOSIS — H8113 Benign paroxysmal vertigo, bilateral: Secondary | ICD-10-CM | POA: Diagnosis not present

## 2022-03-19 DIAGNOSIS — L729 Follicular cyst of the skin and subcutaneous tissue, unspecified: Secondary | ICD-10-CM | POA: Diagnosis not present

## 2022-03-19 DIAGNOSIS — E1142 Type 2 diabetes mellitus with diabetic polyneuropathy: Secondary | ICD-10-CM | POA: Diagnosis not present

## 2022-03-19 DIAGNOSIS — I639 Cerebral infarction, unspecified: Secondary | ICD-10-CM | POA: Diagnosis not present

## 2022-03-19 DIAGNOSIS — E785 Hyperlipidemia, unspecified: Secondary | ICD-10-CM | POA: Diagnosis not present

## 2022-03-19 DIAGNOSIS — D509 Iron deficiency anemia, unspecified: Secondary | ICD-10-CM | POA: Diagnosis not present

## 2022-03-19 DIAGNOSIS — G4730 Sleep apnea, unspecified: Secondary | ICD-10-CM | POA: Diagnosis not present

## 2022-03-20 ENCOUNTER — Telehealth: Payer: Self-pay | Admitting: *Deleted

## 2022-03-20 NOTE — Telephone Encounter (Signed)
Call received from patient stating that he had lab work done with his PCP which resulted with an iron saturation of 4 and would like to know if he should come in for an iron infusion.  Dr. Marin Olp notified and order received for pt to come in on Tuesday, 03/25/22 to see Dr. Marin Olp and for iron infusion.  Pt notified and message sent to scheduling.  ? ? ?

## 2022-03-23 ENCOUNTER — Inpatient Hospital Stay (HOSPITAL_COMMUNITY)
Admission: EM | Admit: 2022-03-23 | Discharge: 2022-03-25 | DRG: 377 | Disposition: A | Discharge status: Home / Self Care | Payer: HMO | Attending: Family Medicine | Admitting: Family Medicine

## 2022-03-23 ENCOUNTER — Encounter (HOSPITAL_COMMUNITY): Payer: Self-pay | Admitting: Emergency Medicine

## 2022-03-23 ENCOUNTER — Other Ambulatory Visit: Payer: Self-pay

## 2022-03-23 ENCOUNTER — Emergency Department (HOSPITAL_COMMUNITY): Payer: HMO

## 2022-03-23 DIAGNOSIS — K5790 Diverticulosis of intestine, part unspecified, without perforation or abscess without bleeding: Secondary | ICD-10-CM | POA: Diagnosis present

## 2022-03-23 DIAGNOSIS — E785 Hyperlipidemia, unspecified: Secondary | ICD-10-CM | POA: Diagnosis not present

## 2022-03-23 DIAGNOSIS — F32A Depression, unspecified: Secondary | ICD-10-CM | POA: Diagnosis present

## 2022-03-23 DIAGNOSIS — E1142 Type 2 diabetes mellitus with diabetic polyneuropathy: Secondary | ICD-10-CM | POA: Diagnosis not present

## 2022-03-23 DIAGNOSIS — K909 Intestinal malabsorption, unspecified: Secondary | ICD-10-CM | POA: Diagnosis not present

## 2022-03-23 DIAGNOSIS — R338 Other retention of urine: Secondary | ICD-10-CM

## 2022-03-23 DIAGNOSIS — Z87891 Personal history of nicotine dependence: Secondary | ICD-10-CM | POA: Diagnosis not present

## 2022-03-23 DIAGNOSIS — H409 Unspecified glaucoma: Secondary | ICD-10-CM | POA: Diagnosis not present

## 2022-03-23 DIAGNOSIS — Z91013 Allergy to seafood: Secondary | ICD-10-CM | POA: Diagnosis not present

## 2022-03-23 DIAGNOSIS — K5791 Diverticulosis of intestine, part unspecified, without perforation or abscess with bleeding: Secondary | ICD-10-CM | POA: Diagnosis not present

## 2022-03-23 DIAGNOSIS — D62 Acute posthemorrhagic anemia: Secondary | ICD-10-CM | POA: Diagnosis not present

## 2022-03-23 DIAGNOSIS — K559 Vascular disorder of intestine, unspecified: Secondary | ICD-10-CM | POA: Diagnosis not present

## 2022-03-23 DIAGNOSIS — R000 Tachycardia, unspecified: Secondary | ICD-10-CM | POA: Diagnosis not present

## 2022-03-23 DIAGNOSIS — Z20822 Contact with and (suspected) exposure to covid-19: Secondary | ICD-10-CM | POA: Diagnosis not present

## 2022-03-23 DIAGNOSIS — R9431 Abnormal electrocardiogram [ECG] [EKG]: Secondary | ICD-10-CM | POA: Diagnosis not present

## 2022-03-23 DIAGNOSIS — K5730 Diverticulosis of large intestine without perforation or abscess without bleeding: Secondary | ICD-10-CM | POA: Diagnosis not present

## 2022-03-23 DIAGNOSIS — K4020 Bilateral inguinal hernia, without obstruction or gangrene, not specified as recurrent: Secondary | ICD-10-CM | POA: Diagnosis not present

## 2022-03-23 DIAGNOSIS — Z833 Family history of diabetes mellitus: Secondary | ICD-10-CM

## 2022-03-23 DIAGNOSIS — R1111 Vomiting without nausea: Secondary | ICD-10-CM | POA: Diagnosis not present

## 2022-03-23 DIAGNOSIS — Z8719 Personal history of other diseases of the digestive system: Secondary | ICD-10-CM | POA: Diagnosis not present

## 2022-03-23 DIAGNOSIS — K5732 Diverticulitis of large intestine without perforation or abscess without bleeding: Secondary | ICD-10-CM | POA: Diagnosis present

## 2022-03-23 DIAGNOSIS — Z8371 Family history of colonic polyps: Secondary | ICD-10-CM | POA: Diagnosis not present

## 2022-03-23 DIAGNOSIS — Z79899 Other long term (current) drug therapy: Secondary | ICD-10-CM | POA: Diagnosis not present

## 2022-03-23 DIAGNOSIS — Z823 Family history of stroke: Secondary | ICD-10-CM

## 2022-03-23 DIAGNOSIS — E1122 Type 2 diabetes mellitus with diabetic chronic kidney disease: Secondary | ICD-10-CM | POA: Diagnosis not present

## 2022-03-23 DIAGNOSIS — G4733 Obstructive sleep apnea (adult) (pediatric): Secondary | ICD-10-CM | POA: Diagnosis present

## 2022-03-23 DIAGNOSIS — J181 Lobar pneumonia, unspecified organism: Secondary | ICD-10-CM | POA: Diagnosis present

## 2022-03-23 DIAGNOSIS — Z886 Allergy status to analgesic agent status: Secondary | ICD-10-CM

## 2022-03-23 DIAGNOSIS — R339 Retention of urine, unspecified: Secondary | ICD-10-CM | POA: Diagnosis present

## 2022-03-23 DIAGNOSIS — D509 Iron deficiency anemia, unspecified: Secondary | ICD-10-CM | POA: Diagnosis not present

## 2022-03-23 DIAGNOSIS — K922 Gastrointestinal hemorrhage, unspecified: Secondary | ICD-10-CM | POA: Diagnosis not present

## 2022-03-23 DIAGNOSIS — Z7985 Long-term (current) use of injectable non-insulin antidiabetic drugs: Secondary | ICD-10-CM | POA: Diagnosis not present

## 2022-03-23 DIAGNOSIS — K219 Gastro-esophageal reflux disease without esophagitis: Secondary | ICD-10-CM | POA: Diagnosis not present

## 2022-03-23 DIAGNOSIS — Z7984 Long term (current) use of oral hypoglycemic drugs: Secondary | ICD-10-CM | POA: Diagnosis not present

## 2022-03-23 DIAGNOSIS — R112 Nausea with vomiting, unspecified: Secondary | ICD-10-CM | POA: Diagnosis not present

## 2022-03-23 DIAGNOSIS — K5731 Diverticulosis of large intestine without perforation or abscess with bleeding: Principal | ICD-10-CM | POA: Diagnosis present

## 2022-03-23 DIAGNOSIS — Z8673 Personal history of transient ischemic attack (TIA), and cerebral infarction without residual deficits: Secondary | ICD-10-CM

## 2022-03-23 DIAGNOSIS — N1831 Chronic kidney disease, stage 3a: Secondary | ICD-10-CM | POA: Diagnosis not present

## 2022-03-23 DIAGNOSIS — Z7902 Long term (current) use of antithrombotics/antiplatelets: Secondary | ICD-10-CM

## 2022-03-23 DIAGNOSIS — Z9989 Dependence on other enabling machines and devices: Secondary | ICD-10-CM | POA: Diagnosis not present

## 2022-03-23 DIAGNOSIS — R110 Nausea: Secondary | ICD-10-CM | POA: Diagnosis not present

## 2022-03-23 DIAGNOSIS — N183 Chronic kidney disease, stage 3 unspecified: Secondary | ICD-10-CM

## 2022-03-23 DIAGNOSIS — R739 Hyperglycemia, unspecified: Secondary | ICD-10-CM | POA: Diagnosis not present

## 2022-03-23 DIAGNOSIS — Z801 Family history of malignant neoplasm of trachea, bronchus and lung: Secondary | ICD-10-CM

## 2022-03-23 DIAGNOSIS — K449 Diaphragmatic hernia without obstruction or gangrene: Secondary | ICD-10-CM | POA: Diagnosis not present

## 2022-03-23 LAB — COMPREHENSIVE METABOLIC PANEL
ALT: 12 U/L (ref 0–44)
AST: 14 U/L — ABNORMAL LOW (ref 15–41)
Albumin: 3.4 g/dL — ABNORMAL LOW (ref 3.5–5.0)
Alkaline Phosphatase: 62 U/L (ref 38–126)
Anion gap: 11 (ref 5–15)
BUN: 24 mg/dL — ABNORMAL HIGH (ref 8–23)
CO2: 21 mmol/L — ABNORMAL LOW (ref 22–32)
Calcium: 8.3 mg/dL — ABNORMAL LOW (ref 8.9–10.3)
Chloride: 104 mmol/L (ref 98–111)
Creatinine, Ser: 1.38 mg/dL — ABNORMAL HIGH (ref 0.61–1.24)
GFR, Estimated: 56 mL/min — ABNORMAL LOW (ref >60)
Glucose, Bld: 297 mg/dL — ABNORMAL HIGH (ref 70–99)
Potassium: 3.9 mmol/L (ref 3.5–5.1)
Sodium: 136 mmol/L (ref 135–145)
Total Bilirubin: 0.4 mg/dL (ref 0.3–1.2)
Total Protein: 5.8 g/dL — ABNORMAL LOW (ref 6.5–8.1)

## 2022-03-23 LAB — CBC
HCT: 23.9 % — ABNORMAL LOW (ref 39.0–52.0)
Hemoglobin: 7.2 g/dL — ABNORMAL LOW (ref 13.0–17.0)
MCH: 24.2 pg — ABNORMAL LOW (ref 26.0–34.0)
MCHC: 30.1 g/dL (ref 30.0–36.0)
MCV: 80.2 fL (ref 80.0–100.0)
Platelets: 269 10*3/uL (ref 150–400)
RBC: 2.98 MIL/uL — ABNORMAL LOW (ref 4.22–5.81)
RDW: 14.9 % (ref 11.5–15.5)
WBC: 11 10*3/uL — ABNORMAL HIGH (ref 4.0–10.5)
nRBC: 0 % (ref 0.0–0.2)

## 2022-03-23 LAB — GLUCOSE, CAPILLARY
Glucose-Capillary: 165 mg/dL — ABNORMAL HIGH (ref 70–99)
Glucose-Capillary: 129 mg/dL — ABNORMAL HIGH (ref 70–99)
Glucose-Capillary: 173 mg/dL — ABNORMAL HIGH (ref 70–99)

## 2022-03-23 LAB — HEMOGLOBIN A1C
Hgb A1c MFr Bld: 6.8 % — ABNORMAL HIGH (ref 4.8–5.6)
Mean Plasma Glucose: 148.46 mg/dL

## 2022-03-23 LAB — LIPASE, BLOOD: Lipase: 28 U/L (ref 11–51)

## 2022-03-23 LAB — PROTIME-INR
INR: 1.2 (ref 0.8–1.2)
Prothrombin Time: 14.6 seconds (ref 11.4–15.2)

## 2022-03-23 LAB — HEMOGLOBIN AND HEMATOCRIT, BLOOD
HCT: 29.1 % — ABNORMAL LOW (ref 39.0–52.0)
HCT: 27.7 % — ABNORMAL LOW (ref 39.0–52.0)
Hemoglobin: 9.2 g/dL — ABNORMAL LOW (ref 13.0–17.0)
Hemoglobin: 9 g/dL — ABNORMAL LOW (ref 13.0–17.0)

## 2022-03-23 LAB — RESP PANEL BY RT-PCR (FLU A&B, COVID) ARPGX2
Influenza A by PCR: NEGATIVE
Influenza B by PCR: NEGATIVE
SARS Coronavirus 2 by RT PCR: NEGATIVE

## 2022-03-23 LAB — STREP PNEUMONIAE URINARY ANTIGEN: Strep Pneumo Urinary Antigen: NEGATIVE

## 2022-03-23 LAB — TROPONIN I (HIGH SENSITIVITY)
Troponin I (High Sensitivity): 8 ng/L (ref <18)
Troponin I (High Sensitivity): 8 ng/L (ref <18)

## 2022-03-23 LAB — PREPARE RBC (CROSSMATCH)

## 2022-03-23 MED ORDER — DORZOLAMIDE HCL 2 % OP SOLN
1.0000 [drp] | Freq: Two times a day (BID) | OPHTHALMIC | Status: DC
  Filled 2022-03-23: qty 10

## 2022-03-23 MED ORDER — ONDANSETRON HCL 4 MG PO TABS: 4.0000 mg | ORAL_TABLET | Freq: Four times a day (QID) | ORAL | Status: DC | PRN

## 2022-03-23 MED ORDER — IOHEXOL 350 MG/ML SOLN
100.0000 mL | Freq: Once | INTRAVENOUS | Status: AC | PRN
  Administered 2022-03-23: 100 mL via INTRAVENOUS

## 2022-03-23 MED ORDER — MELATONIN 5 MG PO TABS
5.0000 mg | ORAL_TABLET | Freq: Every day | ORAL | Status: DC
  Administered 2022-03-23 – 2022-03-24 (×2): 5 mg via ORAL
  Filled 2022-03-23 (×2): qty 1

## 2022-03-23 MED ORDER — INSULIN ASPART 100 UNIT/ML IJ SOLN
0.0000 [IU] | Freq: Three times a day (TID) | INTRAMUSCULAR | Status: DC
  Administered 2022-03-23: 2 [IU] via SUBCUTANEOUS
  Administered 2022-03-23: 3 [IU] via SUBCUTANEOUS
  Administered 2022-03-24: 2 [IU] via SUBCUTANEOUS
  Administered 2022-03-25: 3 [IU] via SUBCUTANEOUS
  Filled 2022-03-23: qty 0.15

## 2022-03-23 MED ORDER — PANTOPRAZOLE 80MG IVPB - SIMPLE MED
80.0000 mg | Freq: Once | INTRAVENOUS | Status: AC
  Administered 2022-03-23: 80 mg via INTRAVENOUS
  Filled 2022-03-23: qty 80

## 2022-03-23 MED ORDER — TAMSULOSIN HCL 0.4 MG PO CAPS
0.4000 mg | ORAL_CAPSULE | Freq: Every day | ORAL | Status: DC
  Administered 2022-03-23 – 2022-03-24 (×2): 0.4 mg via ORAL
  Filled 2022-03-23 (×2): qty 1

## 2022-03-23 MED ORDER — PANTOPRAZOLE INFUSION (NEW) - SIMPLE MED
8.0000 mg/h | INTRAVENOUS | Status: DC
  Administered 2022-03-23 – 2022-03-25 (×5): 8 mg/h via INTRAVENOUS
  Filled 2022-03-23 (×5): qty 80
  Filled 2022-03-23: qty 100

## 2022-03-23 MED ORDER — GABAPENTIN 300 MG PO CAPS
300.0000 mg | ORAL_CAPSULE | Freq: Every day | ORAL | Status: DC
  Administered 2022-03-23 – 2022-03-24 (×2): 300 mg via ORAL
  Filled 2022-03-23 (×2): qty 1

## 2022-03-23 MED ORDER — SODIUM CHLORIDE 0.9 % IV SOLN
500.0000 mg | INTRAVENOUS | Status: DC
  Administered 2022-03-24 – 2022-03-25 (×2): 500 mg via INTRAVENOUS
  Filled 2022-03-23 (×2): qty 5

## 2022-03-23 MED ORDER — SODIUM CHLORIDE 0.9 % IV SOLN: Freq: Once | INTRAVENOUS | Status: AC

## 2022-03-23 MED ORDER — SODIUM CHLORIDE 0.9 % IV SOLN
1.0000 g | Freq: Once | INTRAVENOUS | Status: AC
  Administered 2022-03-23: 1 g via INTRAVENOUS
  Filled 2022-03-23: qty 10

## 2022-03-23 MED ORDER — ONDANSETRON HCL 4 MG/2ML IJ SOLN: 4.0000 mg | Freq: Four times a day (QID) | INTRAMUSCULAR | Status: DC | PRN

## 2022-03-23 MED ORDER — LIDOCAINE HCL URETHRAL/MUCOSAL 2 % EX GEL
1.0000 "application " | Freq: Once | CUTANEOUS | Status: AC
  Administered 2022-03-23: 1 via URETHRAL
  Filled 2022-03-23: qty 5

## 2022-03-23 MED ORDER — SODIUM CHLORIDE 0.9 % IV SOLN
2.0000 g | INTRAVENOUS | Status: DC
  Administered 2022-03-24 – 2022-03-25 (×2): 2 g via INTRAVENOUS
  Filled 2022-03-23 (×2): qty 20

## 2022-03-23 MED ORDER — SODIUM CHLORIDE 0.9 % IV SOLN
10.0000 mL/h | Freq: Once | INTRAVENOUS | Status: AC
  Administered 2022-03-23: 10 mL/h via INTRAVENOUS

## 2022-03-23 MED ORDER — SODIUM CHLORIDE 0.9 % IV SOLN
1.0000 g | INTRAVENOUS | Status: AC
  Administered 2022-03-23: 1 g via INTRAVENOUS
  Filled 2022-03-23: qty 10

## 2022-03-23 MED ORDER — PANTOPRAZOLE SODIUM 40 MG IV SOLR: 40.0000 mg | Freq: Two times a day (BID) | INTRAVENOUS | Status: DC

## 2022-03-23 MED ORDER — LATANOPROST 0.005 % OP SOLN
1.0000 [drp] | Freq: Every day | OPHTHALMIC | Status: DC
  Filled 2022-03-23: qty 2.5

## 2022-03-23 MED ORDER — SODIUM CHLORIDE 0.9 % IV SOLN
500.0000 mg | Freq: Once | INTRAVENOUS | Status: AC
  Administered 2022-03-23: 500 mg via INTRAVENOUS
  Filled 2022-03-23: qty 5

## 2022-03-23 MED ORDER — SIMVASTATIN 20 MG PO TABS
20.0000 mg | ORAL_TABLET | Freq: Every evening | ORAL | Status: DC
  Administered 2022-03-23 – 2022-03-24 (×2): 20 mg via ORAL
  Filled 2022-03-23 (×2): qty 1

## 2022-03-23 MED ORDER — SODIUM CHLORIDE 0.9 % IV BOLUS
1000.0000 mL | Freq: Once | INTRAVENOUS | Status: AC
  Administered 2022-03-23: 1000 mL via INTRAVENOUS

## 2022-03-23 MED ORDER — SERTRALINE HCL 100 MG PO TABS
100.0000 mg | ORAL_TABLET | Freq: Every day | ORAL | Status: DC
  Administered 2022-03-23 – 2022-03-25 (×3): 100 mg via ORAL
  Filled 2022-03-23 (×3): qty 1

## 2022-03-23 MED ORDER — SODIUM BICARBONATE 650 MG PO TABS
650.0000 mg | ORAL_TABLET | Freq: Two times a day (BID) | ORAL | Status: DC
  Administered 2022-03-23 – 2022-03-25 (×4): 650 mg via ORAL
  Filled 2022-03-23 (×4): qty 1

## 2022-03-23 NOTE — Consult Note (Signed)
Urology Consult  ? ?Reason for consult: urinary retention, difficulty foley ? ?History of Present Illness: Adam Quinn is a 66 y.o. currently admitted to [facility] with a GI bleed. He was found to be in urinary retention and attempts to place a foley thus far have been unsuccessful ? ?Mr Adam Quinn has no GU history and no voiding complaints previously. He has only been able to void small amounts and bladder scan shows >1L ? ? ? ?Past Medical History:  ?Diagnosis Date  ? Allergy   ? seasonal  ? Anxiety   ? Cataract   ? cataract surgery bilaterally resolved issues  ? Central scotoma   ? Chronic kidney disease   ? small protien showing uses Lisinopril for this  ? Diverticulitis   ? Diverticulosis   ? DM (diabetes mellitus) ([place])   ? GERD (gastroesophageal reflux disease)   ? Glaucoma   ? History of colon polyps   ? History of hiatal hernia   ? History of kidney stones   ? x1  ? Iron deficiency anemia   ? Iron infusion 04/29/2019  ? Neuropathy   ? OSA on CPAP   ? Stroke [facility])   ? Wears glasses   ? READING  ? ? ?Past Surgical History:  ?Procedure Laterality Date  ? cataract surgery    ? bilateral  ? COLONOSCOPY    ? COLONOSCOPY N/A 06/04/2015  ? Procedure: COLONOSCOPY;  Surgeon: Inda Castle, MD;  Location: [facility] ENDOSCOPY;  Service: Endoscopy;  Laterality: N/A;  ? COLONOSCOPY WITH PROPOFOL N/A 05/09/2019  ? Procedure: COLONOSCOPY WITH PROPOFOL;  Surgeon: Doran Stabler, MD;  Location: [facility] ENDOSCOPY;  Service: Gastroenterology;  Laterality: N/A;  ? COLONOSCOPY WITH PROPOFOL N/A 01/30/2022  ? Procedure: COLONOSCOPY WITH PROPOFOL;  Surgeon: Mauri Pole, MD;  Location: [facility] ENDOSCOPY;  Service: Endoscopy;  Laterality: N/A;  ? ESOPHAGOGASTRODUODENOSCOPY N/A 06/04/2015  ? Procedure: ESOPHAGOGASTRODUODENOSCOPY (EGD);  Surgeon: Inda Castle, MD;  Location: [facility];  Service: Endoscopy;  Laterality: N/A;  ? ESOPHAGOGASTRODUODENOSCOPY (EGD) WITH PROPOFOL N/A 08/02/2021  ? Procedure: ESOPHAGOGASTRODUODENOSCOPY (EGD) WITH PROPOFOL;   Surgeon: Mauri Pole, MD;  Location: [facility] ENDOSCOPY;  Service: Endoscopy;  Laterality: N/A;  ? POLYPECTOMY  01/30/2022  ? Procedure: POLYPECTOMY;  Surgeon: Mauri Pole, MD;  Location: [facility] ENDOSCOPY;  Service: Endoscopy;;  ? UPPER GASTROINTESTINAL ENDOSCOPY    ? WISDOM TOOTH EXTRACTION    ? with sedation  ? ? ?Current Hospital Medications: ? ?Home Meds:  ?No current facility-administered medications on file prior to encounter.  ? ?Current Outpatient Medications on File Prior to Encounter  ?Medication Sig Dispense Refill  ? bimatoprost (LUMIGAN) 0.01 % SOLN Instill 1 drop in both eyes at bedtime 7.5 mL 3  ? clopidogrel (PLAVIX) 75 MG tablet Take 1 tablet (75 mg total) by mouth daily. 90 tablet 1  ? dorzolamide (TRUSOPT) 2 % ophthalmic solution Instill 1 drop in both eyes twice a day 10 mL 4  ? Dulaglutide (TRULICITY) 1.5 JQ/7.3AL SOPN Inject under the skin once a week (Patient taking differently: Inject 1.5 mg into the skin every Sunday.) 6 mL 4  ? ELDERBERRY PO Take 1 tablet by mouth at bedtime. Gummy    ? gabapentin (NEURONTIN) 300 MG capsule TAKE 1 CAPSULE BY MOUTH ONCE DAILY (Patient taking differently: Take 300 mg by mouth at bedtime.) 90 capsule 0  ? insulin degludec (TRESIBA FLEXTOUCH) 200 UNIT/ML FlexTouch Pen Inject 50 units under the skin 1 - 2 times per week (Patient  taking differently: Inject 30 Units into the skin daily as needed (if BG is reading 150 or more).) 9 mL 1  ? lansoprazole (PREVACID) 30 MG capsule TAKE 1 CAPSULE BY MOUTH TWICE DAILY 180 capsule 1  ? loratadine-pseudoephedrine (ALAVERT D-12 HOUR ALLERGY/CONG) 5-120 MG tablet Take 1 tablet by mouth at bedtime.    ? Melatonin 5 MG CHEW Chew 5 mg by mouth at bedtime.    ? metFORMIN (GLUCOPHAGE-XR) 500 MG 24 hr tablet Take 2 tablets by mouth twice a day 360 tablet 1  ? Probiotic Product (RA PROBIOTIC GUMMIES PO) Take 1 capsule by mouth at bedtime.    ? sertraline (ZOLOFT) 100 MG tablet Take 1 tablet by mouth once daily 90 tablet 1  ?  simvastatin (ZOCOR) 20 MG tablet Take 1 tablet (20 mg total) by mouth daily. (Patient taking differently: Take 20 mg by mouth every evening.) 90 tablet 3  ? sodium bicarbonate 650 MG tablet Take 1 tablet by mouth twice a day 90 days 180 tablet 2  ? Continuous Blood Gluc Sensor (FREESTYLE LIBRE 2 SENSOR) MISC Apply sensor to the body as directed every 2 weeks to read blood sugar continuously 6 each 3  ? COVID-19 At Home Antigen Test [facility] COVID-19 HOME TEST) KIT Use as directed 4 each 0  ? Insulin Pen Needle 31G X 5 MM MISC Use as directed with Tresiba 100 each 1  ? ? ? ?Scheduled Meds: ? dorzolamide  1 drop Both Eyes BID  ? insulin aspart  0-15 Units Subcutaneous TID WC  ? latanoprost  1 drop Both Eyes QHS  ? [START ON 03/26/2022] pantoprazole  40 mg Intravenous Q12H  ? ?Continuous Infusions: ? [START ON 03/24/2022] azithromycin    ? cefTRIAXone (ROCEPHIN)  IV 1 g (03/23/22 1209)  ? [START ON 03/24/2022] cefTRIAXone (ROCEPHIN)  IV    ? pantoprazole 8 mg/hr (03/23/22 7510)  ? ?PRN Meds:.ondansetron **OR** ondansetron (ZOFRAN) IV ? ?Allergies:  ?Allergies  ?Allergen Reactions  ? Aspirin Anaphylaxis and Hives  ? Shrimp [Shellfish Allergy] Hives  ?  Just shrimp ?  ? ? ?Family History  ?Problem Relation Age of Onset  ? Colon polyps Father   ? Lung cancer Father   ? Diabetes Mother   ? Dementia Mother   ? Diabetes Sister   ?     x 2  ? Stroke Maternal Grandmother   ? Stroke Paternal Grandfather   ? Colon cancer Neg Hx   ? Rectal cancer Neg Hx   ? Stomach cancer Neg Hx   ? Pancreatic cancer Neg Hx   ? ? ?Social History:  reports that he quit smoking about 24 years ago. His smoking use included cigarettes. He has never used smokeless tobacco. He reports that he does not drink alcohol and does not use drugs. ? ?ROS: ?A complete review of systems was performed.  All systems are negative except for pertinent findings as noted. ? ?Physical Exam:  ?Vital signs in last 24 hours: ?Temp:  [97.6 ?F (36.4 ?C)-99.8 ?F (37.7 ?C)] 99 ?F  (37.2 ?C) (04/30 2585) ?Pulse Rate:  [101-146] 121 (04/30 0952) ?Resp:  [12-23] 20 (04/30 2778) ?BP: (89-161)/(59-86) 143/81 (04/30 2423) ?SpO2:  [93 %-100 %] 93 % (04/30 0952) ?Constitutional:  Alert and oriented, No acute distress ?Cardiovascular: Regular rate and rhythm ?Respiratory: Normal respiratory effort, Lungs clear bilaterally ?GI: Abdomen is soft, nontender, nondistended, no abdominal masses ?GU: No CVA tenderness ?Neurologic: Grossly intact, no focal deficits ?Psychiatric: Normal mood and affect ? ?  Laboratory Data:  ?Recent Labs  ?  03/23/22 ?0129  ?WBC 11.0*  ?HGB 7.2*  ?HCT 23.9*  ?PLT 269  ? ? ?Recent Labs  ?  03/23/22 ?0129  ?NA 136  ?K 3.9  ?CL 104  ?GLUCOSE 297*  ?BUN 24*  ?CALCIUM 8.3*  ?CREATININE 1.38*  ? ? ? ?Results for orders placed or performed during the hospital encounter of 03/23/22 (from the past 24 hour(s))  ?Comprehensive metabolic panel     Status: Abnormal  ? Collection Time: 03/23/22  1:29 AM  ?Result Value Ref Range  ? Sodium 136 135 - 145 mmol/L  ? Potassium 3.9 3.5 - 5.1 mmol/L  ? Chloride 104 98 - 111 mmol/L  ? CO2 21 (L) 22 - 32 mmol/L  ? Glucose, Bld 297 (H) 70 - 99 mg/dL  ? BUN 24 (H) 8 - 23 mg/dL  ? Creatinine, Ser 1.38 (H) 0.61 - 1.24 mg/dL  ? Calcium 8.3 (L) 8.9 - 10.3 mg/dL  ? Total Protein 5.8 (L) 6.5 - 8.1 g/dL  ? Albumin 3.4 (L) 3.5 - 5.0 g/dL  ? AST 14 (L) 15 - 41 U/L  ? ALT 12 0 - 44 U/L  ? Alkaline Phosphatase 62 38 - 126 U/L  ? Total Bilirubin 0.4 0.3 - 1.2 mg/dL  ? GFR, Estimated 56 (L) >60 mL/min  ? Anion gap 11 5 - 15  ?CBC     Status: Abnormal  ? Collection Time: 03/23/22  1:29 AM  ?Result Value Ref Range  ? WBC 11.0 (H) 4.0 - 10.5 K/uL  ? RBC 2.98 (L) 4.22 - 5.81 MIL/uL  ? Hemoglobin 7.2 (L) 13.0 - 17.0 g/dL  ? HCT 23.9 (L) 39.0 - 52.0 %  ? MCV 80.2 80.0 - 100.0 fL  ? MCH 24.2 (L) 26.0 - 34.0 pg  ? MCHC 30.1 30.0 - 36.0 g/dL  ? RDW 14.9 11.5 - 15.5 %  ? Platelets 269 150 - 400 K/uL  ? nRBC 0.0 0.0 - 0.2 %  ?Type and screen Tony      Status: None (Preliminary result)  ? Collection Time: 03/23/22  1:29 AM  ?Result Value Ref Range  ? ABO/RH(D) O NEG   ? Antibody Screen NEG   ? Sample Expiration 03/26/2022,2359   ? Unit Number [id number]

## 2022-03-23 NOTE — ED Provider Notes (Signed)
?[facility] DEPT ?Provider Note ? ? ?CSN: [id number] ?Arrival date & time: 03/23/22  0116 ? ?  ? ?History ? ?Chief Complaint  ?Patient presents with  ? Blood In Stools  ? ? ?Adam Quinn is a 66 y.o. male. ? ?Patient presents to the emergency department for evaluation of rectal bleeding.  Patient has had multiple episodes of bright red blood per rectum.  He also had some emesis earlier today that was brown, cannot rule out coffee-ground but he had eaten chocolate pudding prior to the vomiting.  He is not experiencing any abdominal pain.  Patient has a history of fairly significant lower GI bleeding in the past. ? ? ?  ? ?Home Medications ?Prior to Admission medications   ?Medication Sig Start Date End Date Taking? Authorizing Provider  ?bimatoprost (LUMIGAN) 0.01 % SOLN Instill 1 drop in both eyes at bedtime 01/09/22  Yes   ?clopidogrel (PLAVIX) 75 MG tablet Take 1 tablet (75 mg total) by mouth daily. 03/05/22  Yes Frann Rider, NP  ?dorzolamide (TRUSOPT) 2 % ophthalmic solution Instill 1 drop in both eyes twice a day 01/09/22  Yes   ?Dulaglutide (TRULICITY) 1.5 HG/9.9ME SOPN Inject under the skin once a week ?Patient taking differently: Inject 1.5 mg into the skin every Sunday. 09/27/21  Yes   ?ELDERBERRY PO Take 1 tablet by mouth at bedtime. Gummy   Yes [provider]  ?gabapentin (NEURONTIN) 300 MG capsule TAKE 1 CAPSULE BY MOUTH ONCE DAILY ?Patient taking differently: Take 300 mg by mouth at bedtime. 02/07/22  Yes   ?insulin degludec (TRESIBA FLEXTOUCH) 200 UNIT/ML FlexTouch Pen Inject 50 units under the skin 1 - 2 times per week ?Patient taking differently: Inject 30 Units into the skin daily as needed (if BG is reading 150 or more). 08/27/21  Yes   ?lansoprazole (PREVACID) 30 MG capsule TAKE 1 CAPSULE BY MOUTH TWICE DAILY 02/26/22  Yes   ?loratadine-pseudoephedrine (ALAVERT D-12 HOUR ALLERGY/CONG) 5-120 MG tablet Take 1 tablet by mouth at bedtime.   Yes [provider]  ?Melatonin 5 MG CHEW Chew 5 mg by mouth at bedtime.   Yes [provider]  ?metFORMIN (GLUCOPHAGE-XR) 500 MG 24 hr tablet Take 2 tablets by mouth twice a day 02/26/22  Yes   ?Probiotic Product (RA PROBIOTIC GUMMIES PO) Take 1 capsule by mouth at bedtime.   Yes [provider]  ?sertraline (ZOLOFT) 100 MG tablet Take 1 tablet by mouth once daily 10/22/21  Yes   ?simvastatin (ZOCOR) 20 MG tablet Take 1 tablet (20 mg total) by mouth daily. ?Patient taking differently: Take 20 mg by mouth every evening. 09/18/21  Yes   ?sodium bicarbonate 650 MG tablet Take 1 tablet by mouth twice a day 90 days 06/27/21  Yes   ?Continuous Blood Gluc Sensor (FREESTYLE LIBRE 2 SENSOR) MISC Apply sensor to the body as directed every 2 weeks to read blood sugar continuously 08/16/21     ?COVID-19 At Home Antigen Test [facility] COVID-19 HOME TEST) KIT Use as directed 11/14/21   Edmon Crape, [facility]  ?Insulin Pen Needle 31G X 5 MM MISC Use as directed with Tyler Aas 08/27/21     ?   ? ?Allergies    ?Aspirin and Shrimp [shellfish allergy]   ? ?Review of Systems   ?Review of Systems ? ?Physical Exam ?Updated Vital Signs ?BP 139/71   Pulse (!) 122   Temp 97.6 ?F (36.4 ?C) (Oral)   Resp 15   SpO2 94%  ?  Physical Exam ?Vitals and nursing note reviewed.  ?Constitutional:   ?   General: He is not in acute distress. ?   Appearance: He is well-developed.  ?HENT:  ?   Head: Normocephalic and atraumatic.  ?   Mouth/Throat:  ?   Mouth: Mucous membranes are moist.  ?Eyes:  ?   General: Vision grossly intact. Gaze aligned appropriately.  ?   Extraocular Movements: Extraocular movements intact.  ?   Conjunctiva/sclera: Conjunctivae normal.  ?Cardiovascular:  ?   Rate and Rhythm: Normal rate and regular rhythm.  ?   Pulses: Normal pulses.  ?   Heart sounds: Normal heart sounds, S1 normal and S2 normal. No murmur heard. ?  No friction rub. No gallop.  ?Pulmonary:  ?   Effort: Pulmonary effort is normal. No respiratory distress.  ?   Breath  sounds: Normal breath sounds.  ?Abdominal:  ?   Palpations: Abdomen is soft.  ?   Tenderness: There is no abdominal tenderness. There is no guarding or rebound.  ?   Hernia: No hernia is present.  ?Musculoskeletal:     ?   General: No swelling.  ?   Cervical back: Full passive range of motion without pain, normal range of motion and neck supple. No pain with movement, spinous process tenderness or muscular tenderness. Normal range of motion.  ?   Right lower leg: No edema.  ?   Left lower leg: No edema.  ?Skin: ?   General: Skin is warm and dry.  ?   Capillary Refill: Capillary refill takes less than 2 seconds.  ?   Coloration: Skin is pale.  ?   Findings: No ecchymosis, erythema, lesion or wound.  ?Neurological:  ?   Mental Status: He is alert and oriented to person, place, and time.  ?   GCS: GCS eye subscore is 4. GCS verbal subscore is 5. GCS motor subscore is 6.  ?   Cranial Nerves: Cranial nerves 2-12 are intact.  ?   Sensory: Sensation is intact.  ?   Motor: Motor function is intact. No weakness or abnormal muscle tone.  ?   Coordination: Coordination is intact.  ?Psychiatric:     ?   Mood and Affect: Mood normal.     ?   Speech: Speech normal.     ?   Behavior: Behavior normal.  ? ? ?ED Results / Procedures / Treatments   ?Labs ?(all labs ordered are listed, but only abnormal results are displayed) ?Labs Reviewed  ?COMPREHENSIVE METABOLIC PANEL - Abnormal; Notable for the following components:  ?    Result Value  ? CO2 21 (*)   ? Glucose, Bld 297 (*)   ? BUN 24 (*)   ? Creatinine, Ser 1.38 (*)   ? Calcium 8.3 (*)   ? Total Protein 5.8 (*)   ? Albumin 3.4 (*)   ? AST 14 (*)   ? GFR, Estimated 56 (*)   ? All other components within normal limits  ?CBC - Abnormal; Notable for the following components:  ? WBC 11.0 (*)   ? RBC 2.98 (*)   ? Hemoglobin 7.2 (*)   ? HCT 23.9 (*)   ? MCH 24.2 (*)   ? All other components within normal limits  ?PROTIME-INR  ?LIPASE, BLOOD  ?TYPE AND SCREEN  ?PREPARE RBC (CROSSMATCH)   ?TROPONIN I (HIGH SENSITIVITY)  ?TROPONIN I (HIGH SENSITIVITY)  ? ? ?EKG ?None ? ?Radiology ?CT ANGIO GI BLEED ? ?Result Date:  03/23/2022 ?CLINICAL DATA:  Mesenteric ischemia, acute, blood in stool. EXAM: CTA ABDOMEN AND PELVIS WITHOUT AND WITH CONTRAST TECHNIQUE: Multidetector CT imaging of the abdomen and pelvis was performed using the standard protocol during bolus administration of intravenous contrast. Multiplanar reconstructed images and MIPs were obtained and reviewed to evaluate the vascular anatomy. RADIATION DOSE REDUCTION: This exam was performed according to the departmental dose-optimization program which includes automated exposure control, adjustment of the mA and/or kV according to patient size and/or use of iterative reconstruction technique. CONTRAST:  13m OMNIPAQUE IOHEXOL 350 MG/ML SOLN COMPARISON:  07/31/2021. FINDINGS: VASCULAR Aorta: Normal caliber aorta without aneurysm, dissection, vasculitis or significant stenosis. Celiac: Patent without evidence of aneurysm, dissection, vasculitis or significant stenosis. SMA: Patent without evidence of aneurysm, dissection, vasculitis or significant stenosis. Renals: Both renal arteries are patent without evidence of aneurysm, dissection, vasculitis, fibromuscular dysplasia or significant stenosis. IMA: Patent without evidence of aneurysm, dissection, vasculitis or significant stenosis. Inflow: Patent without evidence of aneurysm, dissection, vasculitis or significant stenosis. Proximal Outflow: Bilateral common femoral and visualized portions of the superficial and profunda femoral arteries are patent without evidence of aneurysm, dissection, vasculitis or significant stenosis. Veins: No obvious venous abnormality within the limitations of this arterial phase study. Review of the MIP images confirms the above findings. NON-VASCULAR Lower chest: The heart is enlarged and there is a trace pericardial effusion. Coronary artery calcifications are noted.  Patchy infiltrate is present in the infrahilar region at the left lung base. Hepatobiliary: No focal liver abnormality is seen. No gallstones, gallbladder wall thickening, or biliary dilatation. Pancreas: A st

## 2022-03-23 NOTE — ED Triage Notes (Addendum)
Pt reports vomiting for 1.5 hr. Had an episode of bloody stool en route. Hx of a GI bleed in Sept 2022 that presented similarly. Takes Plavix. Ate a lot of chocolate pudding prior to symptoms. Hx of DM2.  ?

## 2022-03-23 NOTE — ED Notes (Signed)
MD made aware of blood pressure ?

## 2022-03-23 NOTE — ED Notes (Signed)
Pt attempted to be in and out cath'd per verbal order from Dr. Betsey Holiday. Pt bladder scan showed approximately 850 mL urine three times. Resistance met at prostate, attempted second time with coude catheter for in and out unsuccessfully. Pt requested bedside commode to attempt to urinate.  ?

## 2022-03-23 NOTE — ED Notes (Signed)
Blood work sent to lab: ? ?1x lavender ?1x dark green ?1x light green ?1x blue ?2x gold ?2x pink with blood bank stickers  ?

## 2022-03-23 NOTE — ED Notes (Addendum)
To Ct 

## 2022-03-23 NOTE — ED Notes (Signed)
Dr. Marylyn Ishihara updated on unsuccessful in and out attempt. Will page urology nurse if unsuccessful with bedside commode.  ? ?

## 2022-03-23 NOTE — Consult Note (Signed)
? ? ? Consultation ? ?Referring Provider:     ER ?Primary Care Physician:  Kelton Pillar, MD ?Primary Gastroenterologist:        Dr.Jahmar Mckelvy  ?Reason for Consultation:     Lower GI bleed ?  ? ? ?      ? HPI:   ?Adam Quinn is a 66 y.o. pleasant gentleman with history of type 2 diabetes, CVA on chronic antiplatelet therapy with Plavix presented to ER with lower GI bleed ? ?According to his wife he was seen by his primary last week and was noted to be anemic with hemoglobin 10 and low iron saturation.  He was having on and off dark stool past week.  He started passing clots and bright red blood yesterday.  Denies any abdominal pain, vomiting, decreased appetite or unintentional weight loss ? ?He is hemodynamically stable.  Hemoglobin 7.2.  Received 2 units PRBC. ? ?CT angio GI bleed 03/23/22 ?1. Colonic diverticulosis. Subtle pericolonic fat stranding ?involving the ascending colon and cecum without evidence of bowel ?wall thickening or abnormal enhancement. Findings may represent mild ?diverticulitis. No free air or pneumatosis. ?2. Patchy infiltrates in the infrahilar region in the left lung, ?concerning for pneumonia. ?3. Remaining findings are unchanged. ? ?Colonoscopy 01/30/21 ?- One 12 mm polyp in the ascending colon, removed with a cold snare. Resected and ?retrieved. ?- One 4 mm polyp in the rectum, removed with a cold snare. Resected and retrieved. ?- Severe diverticulosis in the sigmoid colon, in the descending colon, in the transverse ?colon, in the ascending colon and in the cecum. There was evidence of diverticular spasm. ?Peri-diverticular erythema was seen. There was evidence of an impacted diverticulum. ?- Non-bleeding external and internal hemorrhoids. ? ?He was hospitalized in September 2022 with similar acute GI hemorrhage/hematochezia ?  ?CT angio abdomen and pelvis 07/31/21 during hospitalization showed evidence of sigmoid diverticulitis otherwise unremarkable exam ? ?EGD 08/02/21 was negative for  any acute upper GI blood loss source ?  ?Colonoscopy in June 2020 showed diverticulosis, was inadequate prep.  He was recommended to repeat colonoscopy in 6 months ?  ?Colonoscopy July 2016 with removal of 15 mm anything angulated cecal polyp and diverticulosis ? ?Past Medical History:  ?Diagnosis Date  ? Allergy   ? seasonal  ? Anxiety   ? Cataract   ? cataract surgery bilaterally resolved issues  ? Central scotoma   ? Chronic kidney disease   ? small protien showing uses Lisinopril for this  ? Diverticulitis   ? Diverticulosis   ? DM (diabetes mellitus) ([place])   ? GERD (gastroesophageal reflux disease)   ? Glaucoma   ? History of colon polyps   ? History of hiatal hernia   ? History of kidney stones   ? x1  ? Iron deficiency anemia   ? Iron infusion 04/29/2019  ? Neuropathy   ? OSA on CPAP   ? Stroke [facility])   ? Wears glasses   ? READING  ? ? ?Past Surgical History:  ?Procedure Laterality Date  ? cataract surgery    ? bilateral  ? COLONOSCOPY    ? COLONOSCOPY N/A 06/04/2015  ? Procedure: COLONOSCOPY;  Surgeon: Inda Castle, MD;  [facility]: [facility];  Service: Endoscopy;  Laterality: N/A;  ? COLONOSCOPY WITH PROPOFOL N/A 05/09/2019  ? Procedure: COLONOSCOPY WITH PROPOFOL;  Surgeon: Doran Stabler, MD;  Location: [facility];  Service: Gastroenterology;  Laterality: N/A;  ? COLONOSCOPY WITH PROPOFOL N/A 01/30/2022  ? Procedure: COLONOSCOPY WITH PROPOFOL;  Surgeon: Mauri Pole, MD;  Location: [facility];  Service: Endoscopy;  Laterality: N/A;  ? ESOPHAGOGASTRODUODENOSCOPY N/A 06/04/2015  ? Procedure: ESOPHAGOGASTRODUODENOSCOPY (EGD);  Surgeon: Inda Castle, MD;  Location: [facility];  Service: Endoscopy;  Laterality: N/A;  ? ESOPHAGOGASTRODUODENOSCOPY (EGD) WITH PROPOFOL N/A 08/02/2021  ? Procedure: ESOPHAGOGASTRODUODENOSCOPY (EGD) WITH PROPOFOL;  Surgeon: Mauri Pole, MD;  [facility];  Service: Endoscopy;  Laterality: N/A;  ? POLYPECTOMY  01/30/2022  ? Procedure: POLYPECTOMY;   Surgeon: Mauri Pole, MD;  Location: [facility];  Service: Endoscopy;;  ? UPPER GASTROINTESTINAL ENDOSCOPY    ? WISDOM TOOTH EXTRACTION    ? with sedation  ? ? ?Family History  ?Problem Relation Age of Onset  ? Colon polyps Father   ? Lung cancer Father   ? Diabetes Mother   ? Dementia Mother   ? Diabetes Sister   ?     x 2  ? Stroke Maternal Grandmother   ? Stroke Paternal Grandfather   ? Colon cancer Neg Hx   ? Rectal cancer Neg Hx   ? Stomach cancer Neg Hx   ? Pancreatic cancer Neg Hx   ?  ? ?Social History  ? ?Tobacco Use  ? Smoking status: Former  ?  Types: Cigarettes  ?  Quit date: 10/04/1997  ?  Years since quitting: 24.4  ? Smokeless tobacco: Never  ?Vaping Use  ? Vaping Use: Never used  ?Substance Use Topics  ? Alcohol use: No  ? Drug use: No  ? ? ?Prior to Admission medications   ?Medication Sig Start Date End Date Taking? Authorizing Provider  ?bimatoprost (LUMIGAN) 0.01 % SOLN Instill 1 drop in both eyes at bedtime 01/09/22  Yes   ?clopidogrel (PLAVIX) 75 MG tablet Take 1 tablet (75 mg total) by mouth daily. 03/05/22  Yes Frann Rider, NP  ?dorzolamide (TRUSOPT) 2 % ophthalmic solution Instill 1 drop in both eyes twice a day 01/09/22  Yes   ?Dulaglutide (TRULICITY) 1.5 OF/7.5ZW SOPN Inject under the skin once a week ?Patient taking differently: Inject 1.5 mg into the skin every Sunday. 09/27/21  Yes   ?ELDERBERRY PO Take 1 tablet by mouth at bedtime. Gummy   Yes [provider]  ?gabapentin (NEURONTIN) 300 MG capsule TAKE 1 CAPSULE BY MOUTH ONCE DAILY ?Patient taking differently: Take 300 mg by mouth at bedtime. 02/07/22  Yes   ?insulin degludec (TRESIBA FLEXTOUCH) 200 UNIT/ML FlexTouch Pen Inject 50 units under the skin 1 - 2 times per week ?Patient taking differently: Inject 30 Units into the skin daily as needed (if BG is reading 150 or more). 08/27/21  Yes   ?lansoprazole (PREVACID) 30 MG capsule TAKE 1 CAPSULE BY MOUTH TWICE DAILY 02/26/22  Yes   ?loratadine-pseudoephedrine (ALAVERT  D-12 HOUR ALLERGY/CONG) 5-120 MG tablet Take 1 tablet by mouth at bedtime.   Yes [provider]  ?Melatonin 5 MG CHEW Chew 5 mg by mouth at bedtime.   Yes [provider]  ?metFORMIN (GLUCOPHAGE-XR) 500 MG 24 hr tablet Take 2 tablets by mouth twice a day 02/26/22  Yes   ?Probiotic Product (RA PROBIOTIC GUMMIES PO) Take 1 capsule by mouth at bedtime.   Yes [provider]  ?sertraline (ZOLOFT) 100 MG tablet Take 1 tablet by mouth once daily 10/22/21  Yes   ?simvastatin (ZOCOR) 20 MG tablet Take 1 tablet (20 mg total) by mouth daily. ?Patient taking differently: Take 20 mg by mouth every evening. 09/18/21  Yes   ?sodium bicarbonate  650 MG tablet Take 1 tablet by mouth twice a day 90 days 06/27/21  Yes   ?Continuous Blood Gluc Sensor (FREESTYLE LIBRE 2 SENSOR) MISC Apply sensor to the body as directed every 2 weeks to read blood sugar continuously 08/16/21     ?COVID-19 At Home Antigen Test [facility] COVID-19 HOME TEST) KIT Use as directed 11/14/21   Edmon Crape, [facility]  ?Insulin Pen Needle 31G X 5 MM MISC Use as directed with Tyler Aas 08/27/21     ? ? ?Current Facility-Administered Medications  ?Medication Dose Route Frequency Provider Last Rate Last Admin  ? 0.9 %  sodium chloride infusion   Intravenous Once Marylyn Ishihara, Tyrone A, DO      ? [START ON 03/24/2022] azithromycin (ZITHROMAX) 500 mg in sodium chloride 0.9 % 250 mL IVPB  500 mg Intravenous Q24H Kyle, Tyrone A, DO      ? cefTRIAXone (ROCEPHIN) 1 g in sodium chloride 0.9 % 100 mL IVPB  1 g Intravenous NOW Marylyn Ishihara, Tyrone A, DO      ? [START ON 03/24/2022] cefTRIAXone (ROCEPHIN) 2 g in sodium chloride 0.9 % 100 mL IVPB  2 g Intravenous Q24H Kyle, Tyrone A, DO      ? dorzolamide (TRUSOPT) 2 % ophthalmic solution 1 drop  1 drop Both Eyes BID Marylyn Ishihara, Tyrone A, DO      ? insulin aspart (novoLOG) injection 0-15 Units  0-15 Units Subcutaneous TID WC Kyle, Tyrone A, DO      ? latanoprost (XALATAN) 0.005 % ophthalmic solution 1 drop  1 drop Both Eyes QHS Kyle,  Tyrone A, DO      ? ondansetron (ZOFRAN) tablet 4 mg  4 mg Oral Q6H PRN Marylyn Ishihara, Tyrone A, DO      ? Or  ? ondansetron (ZOFRAN) injection 4 mg  4 mg Intravenous Q6H PRN Marylyn Ishihara, Tyrone A, DO      ? [START ON 03/26/2022]

## 2022-03-23 NOTE — H&P (Addendum)
?History and Physical  ? ? ?Patient: Adam Quinn KGU:[id number] DOB: 1956/06/24 ?DOA: 03/23/2022 ?DOS: the patient was seen and examined on 03/23/2022 ?PCP: Kelton Pillar, MD  ?Patient coming from: Home ? ?Chief Complaint:  ?Chief Complaint  ?Patient presents with  ? Blood In Stools  ? ?HPI: Adam Quinn is a 65 y.o. male with medical history significant of DM2 w/ neuropathy, recurrent GIB, GERD, CVA, HLD. Presenting with bloody stools. Symptoms started yesterday morning. Initially saw dark stools; but as the day progressed, it transitioned to frank blood in the toilet. He had lightheadedness, dizziness, N/V. He didn't have any chest pain, dyspnea, or hematemesis. When his symptoms didn't improve last night, he decided to come to the ED for help. He denies any other aggravating or alleviating factors.   ? ?Review of Systems: As mentioned in the history of present illness. All other systems reviewed and are negative. ?Past Medical History:  ?Diagnosis Date  ? Allergy   ? seasonal  ? Anxiety   ? Cataract   ? cataract surgery bilaterally resolved issues  ? Central scotoma   ? Chronic kidney disease   ? small protien showing uses Lisinopril for this  ? Diverticulitis   ? Diverticulosis   ? DM (diabetes mellitus) ([place])   ? GERD (gastroesophageal reflux disease)   ? Glaucoma   ? History of colon polyps   ? History of hiatal hernia   ? History of kidney stones   ? x1  ? Iron deficiency anemia   ? Iron infusion 04/29/2019  ? Neuropathy   ? OSA on CPAP   ? Stroke [facility])   ? Wears glasses   ? READING  ? ?Past Surgical History:  ?Procedure Laterality Date  ? cataract surgery    ? bilateral  ? COLONOSCOPY    ? COLONOSCOPY N/A 06/04/2015  ? Procedure: COLONOSCOPY;  Surgeon: Inda Castle, MD;  [facility];  Service: Endoscopy;  Laterality: N/A;  ? COLONOSCOPY WITH PROPOFOL N/A 05/09/2019  ? Procedure: COLONOSCOPY WITH PROPOFOL;  Surgeon: Doran Stabler, MD;  [facility];  Service: Gastroenterology;   Laterality: N/A;  ? COLONOSCOPY WITH PROPOFOL N/A 01/30/2022  ? Procedure: COLONOSCOPY WITH PROPOFOL;  Surgeon: Mauri Pole, MD;  [facility];  Service: Endoscopy;  Laterality: N/A;  ? ESOPHAGOGASTRODUODENOSCOPY N/A 06/04/2015  ? Procedure: ESOPHAGOGASTRODUODENOSCOPY (EGD);  Surgeon: Inda Castle, MD;  [facility];  Service: Endoscopy;  Laterality: N/A;  ? ESOPHAGOGASTRODUODENOSCOPY (EGD) WITH PROPOFOL N/A 08/02/2021  ? Procedure: ESOPHAGOGASTRODUODENOSCOPY (EGD) WITH PROPOFOL;  Surgeon: Mauri Pole, MD;  [facility];  Service: Endoscopy;  Laterality: N/A;  ? POLYPECTOMY  01/30/2022  ? Procedure: POLYPECTOMY;  Surgeon: Mauri Pole, MD;  [facility];  Service: Endoscopy;;  ? UPPER GASTROINTESTINAL ENDOSCOPY    ? WISDOM TOOTH EXTRACTION    ? with sedation  ? ?Social History:  reports that he quit smoking about 24 years ago. His smoking use included cigarettes. He has never used smokeless tobacco. He reports that he does not drink alcohol and does not use drugs. ? ?Allergies  ?Allergen Reactions  ? Aspirin Anaphylaxis and Hives  ? Shrimp [Shellfish Allergy] Hives  ?  Just shrimp ?  ? ? ?Family History  ?Problem Relation Age of Onset  ? Colon polyps Father   ? Lung cancer Father   ? Diabetes Mother   ? Dementia Mother   ? Diabetes Sister   ?     x 2  ?  Stroke Maternal Grandmother   ? Stroke Paternal Grandfather   ? Colon cancer Neg Hx   ? Rectal cancer Neg Hx   ? Stomach cancer Neg Hx   ? Pancreatic cancer Neg Hx   ? ? ?Prior to Admission medications   ?Medication Sig Start Date End Date Taking? Authorizing Provider  ?bimatoprost (LUMIGAN) 0.01 % SOLN Instill 1 drop in both eyes at bedtime 01/09/22  Yes   ?clopidogrel (PLAVIX) 75 MG tablet Take 1 tablet (75 mg total) by mouth daily. 03/05/22  Yes Frann Rider, NP  ?dorzolamide (TRUSOPT) 2 % ophthalmic solution Instill 1 drop in both eyes twice a day 01/09/22  Yes   ?Dulaglutide (TRULICITY) 1.5 QZ/3.0QT SOPN  Inject under the skin once a week ?Patient taking differently: Inject 1.5 mg into the skin every Sunday. 09/27/21  Yes   ?ELDERBERRY PO Take 1 tablet by mouth at bedtime. Gummy   Yes [provider]  ?gabapentin (NEURONTIN) 300 MG capsule TAKE 1 CAPSULE BY MOUTH ONCE DAILY ?Patient taking differently: Take 300 mg by mouth at bedtime. 02/07/22  Yes   ?insulin degludec (TRESIBA FLEXTOUCH) 200 UNIT/ML FlexTouch Pen Inject 50 units under the skin 1 - 2 times per week ?Patient taking differently: Inject 30 Units into the skin daily as needed (if BG is reading 150 or more). 08/27/21  Yes   ?lansoprazole (PREVACID) 30 MG capsule TAKE 1 CAPSULE BY MOUTH TWICE DAILY 02/26/22  Yes   ?loratadine-pseudoephedrine (ALAVERT D-12 HOUR ALLERGY/CONG) 5-120 MG tablet Take 1 tablet by mouth at bedtime.   Yes [provider]  ?Melatonin 5 MG CHEW Chew 5 mg by mouth at bedtime.   Yes [provider]  ?metFORMIN (GLUCOPHAGE-XR) 500 MG 24 hr tablet Take 2 tablets by mouth twice a day 02/26/22  Yes   ?Probiotic Product (RA PROBIOTIC GUMMIES PO) Take 1 capsule by mouth at bedtime.   Yes [provider]  ?sertraline (ZOLOFT) 100 MG tablet Take 1 tablet by mouth once daily 10/22/21  Yes   ?simvastatin (ZOCOR) 20 MG tablet Take 1 tablet (20 mg total) by mouth daily. ?Patient taking differently: Take 20 mg by mouth every evening. 09/18/21  Yes   ?sodium bicarbonate 650 MG tablet Take 1 tablet by mouth twice a day 90 days 06/27/21  Yes   ?Continuous Blood Gluc Sensor (FREESTYLE LIBRE 2 SENSOR) MISC Apply sensor to the body as directed every 2 weeks to read blood sugar continuously 08/16/21     ?COVID-19 At Home Antigen Test [facility] COVID-19 HOME TEST) KIT Use as directed 11/14/21   Edmon Crape, [facility]  ?Insulin Pen Needle 31G X 5 MM MISC Use as directed with Tyler Aas 08/27/21     ? ? ?Physical Exam: ?Vitals:  ? 03/23/22 0630 03/23/22 0659 03/23/22 0700 03/23/22 0714  ?BP: (!) 161/86 135/82 137/78 (!) 126/59  ?Pulse:  (!) 135 (!) 126 (!) 126 (!) 146  ?Resp: _0 ?Temp:  99.5 ?F (37.5 ?C)  99.7 ?F (37.6 ?C)  ?TempSrc:  Oral  Oral  ?SpO2: 95%  94% 96%  ? ?General: 66 y.o. male resting in bed in NAD ?Eyes: PERRL, normal sclera ?ENMT: Nares patent w/o discharge, orophaynx clear, dentition normal, ears w/o discharge/lesions/ulcers ?Neck: Supple, trachea midline ?Cardiovascular:tachy, +S1, S2, no m/g/r, equal pulses throughout ?Respiratory: CTABL, no w/r/r, normal WOB ?GI: BS+, NDNT, no masses noted, no organomegaly noted ?MSK: No e/c/c ?Neuro: A&O x 3, no focal deficits ?Psyc: Appropriate interaction but flat affect, calm/cooperative ? ?  Data Reviewed: ? ?Glucose  297 ?BUN  24 ?Scr  1.38 ?WBC  11.0 ?Hgb 7.2 ? ? ?CTA GIB ?1. Colonic diverticulosis. Subtle pericolonic fat stranding involving the ascending colon and cecum without evidence of bowel wall thickening or abnormal enhancement. Findings may represent mild diverticulitis. No free air or pneumatosis. ?2. Patchy infiltrates in the infrahilar region in the left lung, concerning for pneumonia. ?3. Remaining findings are unchanged. ? ?Assessment and Plan: ?No notes have been filed under this hospital service. ?Service: Hospitalist ?GIB ?    - admit to inpt, progressive ?    - 2 units pRBCs ordered in ED; trend H&H q6h, transfuse for Hgb < 7 ?    - continue protonix ?    - [facility] consulted, appreciate assistance, will keep NPO until they evaluate ? ?LLL PNA ?    - continue rocephin, zithro ?    - check urine legionella/strep ?    - check COVID/flu ? ?DM2 w/ diabetic neuropathy ?    - A1c, SSI, glucose checks; he's NPO for now ?    - resume home neurontin when off NPO status  ? ?GERD ?    - protonix ? ?Hx of CVA ?HLD ?    - continue home statin when off NPO status; holding anti-platelet d/t bleed ? ?Depression ?    - continue home regimen when off NPO status ? ?Urinary retention ?    - q6h bladder scan; I&O cath if greater than 250cc ?    - flomax when he comes off NPO status ?     - UPDATE: unsuccessful attempts for coude placement, urology consulted, appreciate assistance ? ? ?Advance Care Planning:   Code Status: FULL ? ?Consults: LBGI, urology ? ?Family Communication: w/ family

## 2022-03-24 ENCOUNTER — Inpatient Hospital Stay (HOSPITAL_COMMUNITY): Payer: HMO

## 2022-03-24 DIAGNOSIS — D509 Iron deficiency anemia, unspecified: Secondary | ICD-10-CM

## 2022-03-24 DIAGNOSIS — K5731 Diverticulosis of large intestine without perforation or abscess with bleeding: Principal | ICD-10-CM

## 2022-03-24 DIAGNOSIS — K219 Gastro-esophageal reflux disease without esophagitis: Secondary | ICD-10-CM

## 2022-03-24 DIAGNOSIS — N183 Chronic kidney disease, stage 3 unspecified: Secondary | ICD-10-CM

## 2022-03-24 DIAGNOSIS — K5791 Diverticulosis of intestine, part unspecified, without perforation or abscess with bleeding: Secondary | ICD-10-CM | POA: Diagnosis not present

## 2022-03-24 DIAGNOSIS — R338 Other retention of urine: Secondary | ICD-10-CM | POA: Diagnosis not present

## 2022-03-24 DIAGNOSIS — N1831 Chronic kidney disease, stage 3a: Secondary | ICD-10-CM | POA: Diagnosis not present

## 2022-03-24 DIAGNOSIS — K5790 Diverticulosis of intestine, part unspecified, without perforation or abscess without bleeding: Secondary | ICD-10-CM | POA: Diagnosis present

## 2022-03-24 DIAGNOSIS — N289 Disorder of kidney and ureter, unspecified: Secondary | ICD-10-CM | POA: Insufficient documentation

## 2022-03-24 DIAGNOSIS — G4733 Obstructive sleep apnea (adult) (pediatric): Secondary | ICD-10-CM

## 2022-03-24 DIAGNOSIS — D62 Acute posthemorrhagic anemia: Secondary | ICD-10-CM | POA: Diagnosis not present

## 2022-03-24 DIAGNOSIS — Z9989 Dependence on other enabling machines and devices: Secondary | ICD-10-CM

## 2022-03-24 DIAGNOSIS — J181 Lobar pneumonia, unspecified organism: Secondary | ICD-10-CM

## 2022-03-24 LAB — CBC
HCT: 24.8 % — ABNORMAL LOW (ref 39.0–52.0)
HCT: 26 % — ABNORMAL LOW (ref 39.0–52.0)
HCT: 24.5 % — ABNORMAL LOW (ref 39.0–52.0)
Hemoglobin: 7.9 g/dL — ABNORMAL LOW (ref 13.0–17.0)
Hemoglobin: 8 g/dL — ABNORMAL LOW (ref 13.0–17.0)
Hemoglobin: 7.8 g/dL — ABNORMAL LOW (ref 13.0–17.0)
MCH: 25.4 pg — ABNORMAL LOW (ref 26.0–34.0)
MCH: 25.2 pg — ABNORMAL LOW (ref 26.0–34.0)
MCH: 26.1 pg (ref 26.0–34.0)
MCHC: 31.9 g/dL (ref 30.0–36.0)
MCHC: 30.8 g/dL (ref 30.0–36.0)
MCHC: 31.8 g/dL (ref 30.0–36.0)
MCV: 79.7 fL — ABNORMAL LOW (ref 80.0–100.0)
MCV: 82 fL (ref 80.0–100.0)
MCV: 81.9 fL (ref 80.0–100.0)
Platelets: 194 10*3/uL (ref 150–400)
Platelets: 202 10*3/uL (ref 150–400)
Platelets: 193 10*3/uL (ref 150–400)
RBC: 3.11 MIL/uL — ABNORMAL LOW (ref 4.22–5.81)
RBC: 3.17 MIL/uL — ABNORMAL LOW (ref 4.22–5.81)
RBC: 2.99 MIL/uL — ABNORMAL LOW (ref 4.22–5.81)
RDW: 15.9 % — ABNORMAL HIGH (ref 11.5–15.5)
RDW: 16.1 % — ABNORMAL HIGH (ref 11.5–15.5)
RDW: 16.1 % — ABNORMAL HIGH (ref 11.5–15.5)
WBC: 7.6 10*3/uL (ref 4.0–10.5)
WBC: 6.9 10*3/uL (ref 4.0–10.5)
WBC: 7.4 10*3/uL (ref 4.0–10.5)
nRBC: 0 % (ref 0.0–0.2)
nRBC: 0 % (ref 0.0–0.2)
nRBC: 0 % (ref 0.0–0.2)

## 2022-03-24 LAB — GLUCOSE, CAPILLARY
Glucose-Capillary: 108 mg/dL — ABNORMAL HIGH (ref 70–99)
Glucose-Capillary: 112 mg/dL — ABNORMAL HIGH (ref 70–99)
Glucose-Capillary: 127 mg/dL — ABNORMAL HIGH (ref 70–99)
Glucose-Capillary: 129 mg/dL — ABNORMAL HIGH (ref 70–99)
Glucose-Capillary: 135 mg/dL — ABNORMAL HIGH (ref 70–99)
Glucose-Capillary: 157 mg/dL — ABNORMAL HIGH (ref 70–99)

## 2022-03-24 LAB — COMPREHENSIVE METABOLIC PANEL WITH GFR
ALT: 10 U/L (ref 0–44)
AST: 8 U/L — ABNORMAL LOW (ref 15–41)
Albumin: 3 g/dL — ABNORMAL LOW (ref 3.5–5.0)
Alkaline Phosphatase: 56 U/L (ref 38–126)
Anion gap: 5 (ref 5–15)
BUN: 16 mg/dL (ref 8–23)
CO2: 24 mmol/L (ref 22–32)
Calcium: 8.3 mg/dL — ABNORMAL LOW (ref 8.9–10.3)
Chloride: 110 mmol/L (ref 98–111)
Creatinine, Ser: 1.37 mg/dL — ABNORMAL HIGH (ref 0.61–1.24)
GFR, Estimated: 57 mL/min — ABNORMAL LOW
Glucose, Bld: 128 mg/dL — ABNORMAL HIGH (ref 70–99)
Potassium: 4.1 mmol/L (ref 3.5–5.1)
Sodium: 139 mmol/L (ref 135–145)
Total Bilirubin: 0.6 mg/dL (ref 0.3–1.2)
Total Protein: 5.5 g/dL — ABNORMAL LOW (ref 6.5–8.1)

## 2022-03-24 LAB — PROTIME-INR
INR: 1.2 (ref 0.8–1.2)
Prothrombin Time: 15 seconds (ref 11.4–15.2)

## 2022-03-24 LAB — TYPE AND SCREEN
ABO/RH(D): O NEG
Antibody Screen: NEGATIVE
Unit division: 0
Unit division: 0

## 2022-03-24 LAB — IRON AND TIBC
Iron: 15 ug/dL — ABNORMAL LOW (ref 45–182)
Saturation Ratios: 4 % — ABNORMAL LOW (ref 17.9–39.5)
TIBC: 376 ug/dL (ref 250–450)
UIBC: 361 ug/dL

## 2022-03-24 LAB — BASIC METABOLIC PANEL
Anion gap: 7 (ref 5–15)
BUN: 14 mg/dL (ref 8–23)
CO2: 23 mmol/L (ref 22–32)
Calcium: 8.3 mg/dL — ABNORMAL LOW (ref 8.9–10.3)
Chloride: 108 mmol/L (ref 98–111)
Creatinine, Ser: 1.44 mg/dL — ABNORMAL HIGH (ref 0.61–1.24)
GFR, Estimated: 53 mL/min — ABNORMAL LOW (ref >60)
Glucose, Bld: 155 mg/dL — ABNORMAL HIGH (ref 70–99)
Potassium: 4 mmol/L (ref 3.5–5.1)
Sodium: 138 mmol/L (ref 135–145)

## 2022-03-24 LAB — BPAM RBC
Blood Product Expiration Date: 202306012359
Blood Product Expiration Date: 202306042359
ISSUE DATE / TIME: 202304300441
ISSUE DATE / TIME: 202304300724
Unit Type and Rh: 9500
Unit Type and Rh: 9500

## 2022-03-24 LAB — FERRITIN: Ferritin: 8 ng/mL — ABNORMAL LOW (ref 24–336)

## 2022-03-24 MED ORDER — LORATADINE 10 MG PO TABS
10.0000 mg | ORAL_TABLET | Freq: Every day | ORAL | Status: DC
  Administered 2022-03-24: 10 mg via ORAL
  Filled 2022-03-24: qty 1

## 2022-03-24 MED ORDER — SODIUM CHLORIDE 0.9 % IV SOLN: INTRAVENOUS | Status: DC

## 2022-03-24 MED ORDER — CHLORHEXIDINE GLUCONATE CLOTH 2 % EX PADS
6.0000 | MEDICATED_PAD | Freq: Every day | CUTANEOUS | Status: DC
  Administered 2022-03-24: 6 via TOPICAL

## 2022-03-24 MED ORDER — SODIUM CHLORIDE 0.9 % IV SOLN
250.0000 mg | Freq: Every day | INTRAVENOUS | Status: DC
  Administered 2022-03-24 – 2022-03-25 (×2): 250 mg via INTRAVENOUS
  Filled 2022-03-24: qty 20
  Filled 2022-03-24: qty 5

## 2022-03-24 MED ORDER — ACETAMINOPHEN 325 MG PO TABS
650.0000 mg | ORAL_TABLET | Freq: Four times a day (QID) | ORAL | Status: DC | PRN
Start: 2022-03-24 — End: 2022-03-25
  Administered 2022-03-24: 650 mg via ORAL
  Filled 2022-03-24 (×2): qty 2

## 2022-03-24 MED ORDER — TECHNETIUM TC 99M-LABELED RED BLOOD CELLS IV KIT
25.0000 | PACK | Freq: Once | INTRAVENOUS | Status: AC
  Administered 2022-03-24: 25.7 via INTRAVENOUS

## 2022-03-24 NOTE — Assessment & Plan Note (Signed)
CPAP.  

## 2022-03-24 NOTE — Assessment & Plan Note (Addendum)
--  CBG stable, continue SSI; hold metformin; resume Trulicity as needed ?

## 2022-03-24 NOTE — Assessment & Plan Note (Addendum)
--  no hypoxia; continue empiric antibiotics ?

## 2022-03-24 NOTE — Hospital Course (Signed)
66 year old man PMH previous GI bleed, ischemic colitis, presented with rectal bleeding.  Also an episode of vomiting.  Admitted for acute GI bleed, acute blood loss anemia, left lower lobe pneumonia. ?

## 2022-03-24 NOTE — Progress Notes (Signed)
Pt has declined use of CPAP QHS.  Pt to notify RT if he changes his mind.  ?

## 2022-03-24 NOTE — Progress Notes (Signed)
?Progress Note ? ? ?Patient: Adam Quinn:[id number] DOB: 25-Jun-1956 DOA: 03/23/2022     1 ?DOS: the patient was seen and examined on 03/24/2022 ?  ?Brief hospital course: ?66 year old man PMH previous GI bleed, ischemic colitis, presented with rectal bleeding.  Also an episode of vomiting.  Admitted for acute GI bleed, acute blood loss anemia, left lower lobe pneumonia. ? ?Assessment and Plan: ?* GIB (gastrointestinal bleeding) ?--history of rectal bleeding secondary to colonic diverticulosis as well as ischemic colitis ?--recent colonoscopy in the last couple of months that did not show any acute problems. ?--Wife reports that his baseline is around 79, has required outpatient IV iron infusions in the past.  Was referred back to hematology when his hemoglobin was at 10 but has not been seen yet. ?--CT bleeding study and does not show any obvious acute areas of bleeding.  ?--complicated by clopidogrel  ?--If develops recurrent large volume hematochezia, discuss with GI. RBC NM study negative ? ?Lobar pneumonia ([place]) ?--continue empiric antibiotics ? ?Acute blood loss anemia ?--s/p 2 units PRBC Hgb 7.2 > 9.2, down to 7.9 very early this AM. Asymptomatic. ?--trend today, transfuse as needed ? ?Acute urinary retention ?--unsuccessful attempts for coude placement, urology consulted and placed ?--started on flomax ?--could potentially do voiding trial in 3-4 days as inpatient or outpatient pending clinical course.  ? ?CKD stage IIIa ([place]) ?--appears to be at baseline ? ?History of CVA (cerebrovascular accident) ?--on clopidogrel as outpatient; resume when clear per GI ? ?Diabetic polyneuropathy associated with type 2 diabetes mellitus (HCC) ?--CBG stable, continue SSI; hold metformin; resume Trulicity as needed ? ?OSA on CPAP ?--CPAP ? ?GERD without esophagitis ?--continue PPI ? ?Diverticulosis ?--Colonoscopy 01/30/21: ?- One 12 mm polyp in the ascending colon, removed with a cold snare. Resected and ?retrieved. ?- One  4 mm polyp in the rectum, removed with a cold snare. Resected and retrieved. ?- Severe diverticulosis in the sigmoid colon, in the descending colon, in the transverse ?colon, in the ascending colon and in the cecum. There was evidence of diverticular spasm. ?Peri-diverticular erythema was seen. There was evidence of an impacted diverticulum. ?- Non-bleeding external and internal hemorrhoids. ? ?Subjective:  ?--chart reviewed ?--no bleeding since in room, no vomiting ?--feels ok ? ?Physical Exam: ?Vitals:  ? 03/23/22 2035 03/24/22 0028 03/24/22 0517 03/24/22 1310  ?BP: 132/65 121/63 (!) 119/58 140/68  ?Pulse: (!) 123 (!) 115 (!) 107 (!) 101  ?Resp: '16 15 18 18  '$ ?Temp: 99.8 ?F (37.7 ?C) 98.5 ?F (36.9 ?C) 98.8 ?F (37.1 ?C) 98.7 ?F (37.1 ?C)  ?TempSrc: Oral Oral Oral Oral  ?SpO2: 92% 92% 91% 95%  ?Weight:      ?Height:      ?AFVSS ?Level 5 ?Physical Exam ?Vitals and nursing note reviewed.  ?Constitutional:   ?   General: He is not in acute distress. ?   Appearance: He is not ill-appearing or toxic-appearing.  ?Cardiovascular:  ?   Rate and Rhythm: Normal rate and regular rhythm.  ?   Heart sounds: No murmur heard. ?   Comments: Telemetry SR ?Pulmonary:  ?   Effort: Pulmonary effort is normal. No respiratory distress.  ?   Breath sounds: No wheezing, rhonchi or rales.  ?Abdominal:  ?   General: There is no distension.  ?   Palpations: Abdomen is soft.  ?   Tenderness: There is no abdominal tenderness. There is no guarding.  ?Neurological:  ?   Mental Status: He is alert.  ?Psychiatric:     ?  Mood and Affect: Mood normal.     ?   Behavior: Behavior normal.  ? ? ?Data Reviewed: ? ?CBG stable ?Creatinine stable 1.38, suspect at [place] ? ?Family Communication: wife at bedside ? ?Disposition: ?Status is: Inpatient ?Remains inpatient appropriate because: GIB ? Planned Discharge Destination: Home ? ? ? ?Time spent: 35 minutes ? ?Author: ?Murray Hodgkins, MD ?03/24/2022 2:46 PM ? ?For on call review [email or web address].  ?

## 2022-03-24 NOTE — Assessment & Plan Note (Addendum)
--  CT suggested possible diverticulitis -- no symptoms, no evidence clinically ?--Colonoscopy 01/30/21: ?- One 12 mm polyp in the ascending colon, removed with a cold snare. Resected and ?retrieved. ?- One 4 mm polyp in the rectum, removed with a cold snare. Resected and retrieved. ?- Severe diverticulosis in the sigmoid colon, in the descending colon, in the transverse ?colon, in the ascending colon and in the cecum. There was evidence of diverticular spasm. ?Peri-diverticular erythema was seen. There was evidence of an impacted diverticulum. ?- Non-bleeding external and internal hemorrhoids. ?

## 2022-03-24 NOTE — Progress Notes (Addendum)
BRIEF PHARMACY CONSULT NOTE ? ?Adam Quinn is a 66 yo male presenting with hematochezia on Plavix.  GI following- RBC scan negative, no further reports of bleeding.  Hemoglobin today decreased from 9 >> 8.  Pharmacy has been consulted to dose IV iron. Patient received a blood transfusion on 4/30 which will provide ~'200mg'$  of iron.   ? ?5/1 anemia panel: Iron 15 (low), Tsat 4 (low), ferritin 8 (low) ? ?Plan: ?- IV Ferrlecit '250mg'$  IV daily x 3 doses to complete 1g load ? ?Dimple Nanas, PharmD ?03/24/2022 3:57 PM ? ?

## 2022-03-24 NOTE — Progress Notes (Addendum)
? ? Progress Note ? ? Subjective  ?Chief Complaint: Lower GI bleed on Plavix for CVA ? ?Patient lying in bed. ?Been tolerating clear liquids no nausea or vomiting. ?Wife in the room was previous OR nurse currently working in preop. ?Last bowel movement was yesterday 10 AM bright red. ?Patient's not had any more bowel movements, no abdominal pain, no fevers or chills. ?Patient was being taken for RBC-Scan. ? ? ? Objective  ? ?Vital signs in last 24 hours: ?Temp:  [98.5 ?F (36.9 ?C)-99.8 ?F (37.7 ?C)] 98.8 ?F (37.1 ?C) (05/01 0517) ?Pulse Rate:  [105-123] 107 (05/01 0517) ?Resp:  [15-18] 18 (05/01 0517) ?BP: (119-140)/(58-68) 119/58 (05/01 0517) ?SpO2:  [91 %-97 %] 91 % (05/01 0517) ?Weight:  [77.1 kg] 77.1 kg (04/30 1240) ?Last BM Date : 03/23/22 ?Last BM recorded by nurses in past 5 days Stool Type: Type 6 (Mushy consistency with ragged edges) (03/23/2022  9:52 AM) ? ?General:   male in no acute distress  ?Abdomen:  Soft, Obese AB,  No tenderness  ?Extremities:  without  edema. ?Neurologic:  Alert and  oriented x4;  No focal deficits.  ?Psych:  Cooperative. Normal mood and affect. ? ?Intake/Output from previous day: ?04/30 0701 - 05/01 0700 ?In: 1403.5 [P.O.:360; I.V.:259.5; Blood:284; IV Piggyback:100] ?Out: 4300 [Urine:4300] ?Intake/Output this shift: ?No intake/output data recorded. ? ?Studies/Results: ?CT ANGIO GI BLEED ? ?Result Date: 03/23/2022 ?CLINICAL DATA:  Mesenteric ischemia, acute, blood in stool. EXAM: CTA ABDOMEN AND PELVIS WITHOUT AND WITH CONTRAST TECHNIQUE: Multidetector CT imaging of the abdomen and pelvis was performed using the standard protocol during bolus administration of intravenous contrast. Multiplanar reconstructed images and MIPs were obtained and reviewed to evaluate the vascular anatomy. RADIATION DOSE REDUCTION: This exam was performed according to the departmental dose-optimization program which includes automated exposure control, adjustment of the mA and/or kV according to patient  size and/or use of iterative reconstruction technique. CONTRAST:  173m OMNIPAQUE IOHEXOL 350 MG/ML SOLN COMPARISON:  07/31/2021. FINDINGS: VASCULAR Aorta: Normal caliber aorta without aneurysm, dissection, vasculitis or significant stenosis. Celiac: Patent without evidence of aneurysm, dissection, vasculitis or significant stenosis. SMA: Patent without evidence of aneurysm, dissection, vasculitis or significant stenosis. Renals: Both renal arteries are patent without evidence of aneurysm, dissection, vasculitis, fibromuscular dysplasia or significant stenosis. IMA: Patent without evidence of aneurysm, dissection, vasculitis or significant stenosis. Inflow: Patent without evidence of aneurysm, dissection, vasculitis or significant stenosis. Proximal Outflow: Bilateral common femoral and visualized portions of the superficial and profunda femoral arteries are patent without evidence of aneurysm, dissection, vasculitis or significant stenosis. Veins: No obvious venous abnormality within the limitations of this arterial phase study. Review of the MIP images confirms the above findings. NON-VASCULAR Lower chest: The heart is enlarged and there is a trace pericardial effusion. Coronary artery calcifications are noted. Patchy infiltrate is present in the infrahilar region at the left lung base. Hepatobiliary: No focal liver abnormality is seen. No gallstones, gallbladder wall thickening, or biliary dilatation. Pancreas: A stable 6 mm hypodensity is noted in the head of the pancreas. No pancreatic ductal dilatation or surrounding inflammatory changes. Spleen: Normal in size without focal abnormality. Adrenals/Urinary Tract: The adrenal glands are within normal limits. The kidneys enhance symmetrically. Mild malrotation of the right kidney is noted. No renal calculus or hydronephrosis. The bladder is unremarkable. Stomach/Bowel: There is a small hiatal hernia. No bowel obstruction, free air, or pneumatosis. A normal  appendix is present in the right lower quadrant. Scattered diverticula are noted along the colon without  evidence of diverticulitis. No significant bowel wall thickening. There is subtle fat stranding about the ascending colon and cecum. No acute intraluminal hemorrhage is identified. A fat containing structure is noted in the mesentery measuring 2.6 cm and is unchanged from the prior exam, possible fat necrosis. Lymphatic: No abdominal or pelvic lymphadenopathy. Reproductive: Prostate is unremarkable. Other: Fat containing inguinal hernias bilaterally. There is diastasis of the rectus abdominus with a broad-based hernia. No abdominopelvic ascites. Musculoskeletal: No acute osseous abnormality. IMPRESSION: VASCULAR No evidence of acute intraluminal hemorrhage in the small or large bowel. No significant vascular abnormality is identified NON-VASCULAR 1. Colonic diverticulosis. Subtle pericolonic fat stranding involving the ascending colon and cecum without evidence of bowel wall thickening or abnormal enhancement. Findings may represent mild diverticulitis. No free air or pneumatosis. 2. Patchy infiltrates in the infrahilar region in the left lung, concerning for pneumonia. 3. Remaining findings are unchanged. Electronically Signed   By: Brett Fairy M.D.   On: 03/23/2022 05:07   ? ?Lab Results: ?Recent Labs  ?  03/23/22 ?0129 03/23/22 ?1226 03/23/22 ?1819 03/24/22 ?0037  ?WBC 11.0*  --   --  7.6  ?HGB 7.2* 9.2* 9.0* 7.9*  ?HCT 23.9* 29.1* 27.7* 24.8*  ?PLT 269  --   --  194  ? ?BMET ?Recent Labs  ?  03/23/22 ?0129 03/24/22 ?0037  ?NA 136 139  ?K 3.9 4.1  ?CL 104 110  ?CO2 21* 24  ?GLUCOSE 297* 128*  ?BUN 24* 16  ?CREATININE 1.38* 1.37*  ?CALCIUM 8.3* 8.3*  ? ?LFT ?Recent Labs  ?  03/24/22 ?0037  ?PROT 5.5*  ?ALBUMIN 3.0*  ?AST 8*  ?ALT 10  ?ALKPHOS 56  ?BILITOT 0.6  ? ?PT/INR ?Recent Labs  ?  03/23/22 ?0129 03/24/22 ?0037  ?LABPROT 14.6 15.0  ?INR 1.2 1.2  ? ? ? ? Impression/Plan:  ? ?66 year old male history of  CVA on Plavix with painless hematochezia.   ?Hospitalization September 2022 for similar presentation thought to be diverticular ?CT angio abdomen pelvis showed diverticulosis and possible features of mild acute sigmoid diverticulitis otherwise unremarkable ?Started on antibiotics by primary team ?Admitting hemoglobin 7.2, after 2 PRBCs went to 9.0, this morning at 7.9 ?Denies any bowel movement since yesterday AM which was bloody. ?03/24/2022 BUN 16 (24)  Cr 1.37  ?08/02/2021 Iron 64 Ferritin 64 ?01/30/2021 colonoscopy 12 mm polyp, 4 mm polyp severe diverticulosis sigmoid descending transverse and ascending with evidence of diverticular spasm, nonbleeding external and internal hemorrhoids. ?EGD 08/02/21 was negative for any acute upper GI blood loss source ?-Patient going for RBC-Scan, ADDENDUM: this was negative, no further reports of bleeding.  ?-Patient did have decrease from 9-->7.9 hemoglobin however no signs of active GI bleeding, no bowel movement since yesterday.  Could be catch-up bleeding. ?-If patient has any further ACTIVE bleeding consider repeat RBC scan versus CTA ?- Continue supportive care and monitor through the night ?--Continue to monitor H&H with transfusion as needed to maintain hemoglobin greater than 7. ? ? ? LOS: 1 day  ? ?Adam Quinn  03/24/2022, 9:52 AM ? ? ? ?

## 2022-03-24 NOTE — Assessment & Plan Note (Addendum)
--  complicated by clopidogrel for PMH stroke ?--history of rectal bleeding secondary to colonic diverticulosis as well as ischemic colitis ?--Wife reports that his baseline is around 33, has required outpatient IV iron infusions in the past.  Was referred back to hematology when his hemoglobin was at 10 but has not been seen yet. ?--CT bleeding study showed no obvious acute areas of bleeding. Tagged RBC scan was negative ?--current admission is most likely a result of diverticular disease burden based on his recent colonoscopy ?--next step is video capsule endoscopy as outpatient ?--can resume Plabix per GI between 5/3 and 5/5 ?--If significantly hemodynamically unstable bleeding, then get CT angiography.   ?

## 2022-03-24 NOTE — Assessment & Plan Note (Addendum)
--  s/p 2 units PRBC with stable Hgb, 8.5 today ?-- ?

## 2022-03-24 NOTE — Assessment & Plan Note (Addendum)
--  unsuccessful attempts for coude placement, urology consulted and placed catheter 4/30 ?--started on flomax ?--could potentially do voiding trial in 3-4 days as inpatient or outpatient pending clinical course. (May 3 or May 4) ?

## 2022-03-24 NOTE — Assessment & Plan Note (Addendum)
--  on clopidogrel as outpatient; resume when clear per GI ?

## 2022-03-24 NOTE — Assessment & Plan Note (Signed)
continue PPI

## 2022-03-24 NOTE — Assessment & Plan Note (Signed)
--  appears to be at baseline ?

## 2022-03-25 ENCOUNTER — Inpatient Hospital Stay: Payer: HMO | Admitting: Hematology & Oncology

## 2022-03-25 ENCOUNTER — Inpatient Hospital Stay: Payer: HMO

## 2022-03-25 ENCOUNTER — Other Ambulatory Visit (HOSPITAL_BASED_OUTPATIENT_CLINIC_OR_DEPARTMENT_OTHER): Payer: Self-pay

## 2022-03-25 DIAGNOSIS — J181 Lobar pneumonia, unspecified organism: Secondary | ICD-10-CM | POA: Diagnosis not present

## 2022-03-25 DIAGNOSIS — D62 Acute posthemorrhagic anemia: Secondary | ICD-10-CM | POA: Diagnosis not present

## 2022-03-25 DIAGNOSIS — K5791 Diverticulosis of intestine, part unspecified, without perforation or abscess with bleeding: Secondary | ICD-10-CM | POA: Diagnosis not present

## 2022-03-25 DIAGNOSIS — R338 Other retention of urine: Secondary | ICD-10-CM | POA: Diagnosis not present

## 2022-03-25 LAB — BASIC METABOLIC PANEL
Anion gap: 6 (ref 5–15)
BUN: 14 mg/dL (ref 8–23)
CO2: 26 mmol/L (ref 22–32)
Calcium: 8.9 mg/dL (ref 8.9–10.3)
Chloride: 111 mmol/L (ref 98–111)
Creatinine, Ser: 1.39 mg/dL — ABNORMAL HIGH (ref 0.61–1.24)
GFR, Estimated: 56 mL/min — ABNORMAL LOW (ref >60)
Glucose, Bld: 137 mg/dL — ABNORMAL HIGH (ref 70–99)
Potassium: 4.1 mmol/L (ref 3.5–5.1)
Sodium: 143 mmol/L (ref 135–145)

## 2022-03-25 LAB — CBC
HCT: 27.6 % — ABNORMAL LOW (ref 39.0–52.0)
Hemoglobin: 8.5 g/dL — ABNORMAL LOW (ref 13.0–17.0)
MCH: 25.6 pg — ABNORMAL LOW (ref 26.0–34.0)
MCHC: 30.8 g/dL (ref 30.0–36.0)
MCV: 83.1 fL (ref 80.0–100.0)
Platelets: 181 10*3/uL (ref 150–400)
RBC: 3.32 MIL/uL — ABNORMAL LOW (ref 4.22–5.81)
RDW: 16.1 % — ABNORMAL HIGH (ref 11.5–15.5)
WBC: 6.2 10*3/uL (ref 4.0–10.5)
nRBC: 0 % (ref 0.0–0.2)

## 2022-03-25 LAB — GLUCOSE, CAPILLARY
Glucose-Capillary: 115 mg/dL — ABNORMAL HIGH (ref 70–99)
Glucose-Capillary: 141 mg/dL — ABNORMAL HIGH (ref 70–99)
Glucose-Capillary: 148 mg/dL — ABNORMAL HIGH (ref 70–99)
Glucose-Capillary: 157 mg/dL — ABNORMAL HIGH (ref 70–99)

## 2022-03-25 LAB — LEGIONELLA PNEUMOPHILA SEROGP 1 UR AG: L. pneumophila Serogp 1 Ur Ag: NEGATIVE

## 2022-03-25 MED ORDER — AZITHROMYCIN 500 MG PO TABS
500.0000 mg | ORAL_TABLET | Freq: Every day | ORAL | 0 refills | Status: AC
  Filled 2022-03-25: qty 2, 2d supply, fill #0

## 2022-03-25 MED ORDER — CEFDINIR 300 MG PO CAPS
300.0000 mg | ORAL_CAPSULE | Freq: Two times a day (BID) | ORAL | 0 refills | Status: DC
  Filled 2022-03-25: qty 4, 2d supply, fill #0

## 2022-03-25 NOTE — Progress Notes (Signed)
? ? Progress Note ? ?Primary GI: [place] ? Subjective  ?Chief Complaint: Lower GI bleed on Plavix for CVA ? ?Patient lying in bed, had finished breakfast this morning.  Denies nausea vomiting. ?Has not had another bowel movement since 03/23/2022, denies any abdominal pain or need to have bowel movement.  Encourage patient to get up and walk. ? ? Objective  ? ?Vital signs in last 24 hours: ?Temp:  [97.7 ?F (36.5 ?C)-98.7 ?F (37.1 ?C)] 97.7 ?F (36.5 ?C) (05/02 0525) ?Pulse Rate:  [94-101] 94 (05/02 0525) ?Resp:  [18-20] 20 (05/02 0525) ?BP: (138-141)/(68-89) 141/89 (05/02 0525) ?SpO2:  [94 %-95 %] 95 % (05/02 0525) ?Last BM Date : 03/23/22 ?Last BM recorded by nurses in past 5 days Stool Type: Type 6 (Mushy consistency with ragged edges) (03/23/2022  9:52 AM) ? ?General:   male in no acute distress  ?Abdomen:  Soft, Obese AB,  No tenderness  ?Extremities:  without  edema. ?Neurologic:  Alert and  oriented x4;  No focal deficits.  ?Psych:  Cooperative. Normal mood and affect. ? ?Intake/Output from previous day: ?05/01 0701 - 05/02 0700 ?In: 2292.4 [P.O.:1080; I.V.:592.4; IV Piggyback:620.1] ?Out: 3350 [Urine:3350] ?Intake/Output this shift: ?No intake/output data recorded. ? ?Studies/Results: ?NM GI Blood Loss ? ?Result Date: 03/24/2022 ?CLINICAL DATA:  GI bleeding since 03/22/2022, last episode 03/23/2022 at 1000 hours EXAM: NUCLEAR MEDICINE GASTROINTESTINAL BLEEDING SCAN TECHNIQUE: Sequential abdominal images were obtained following intravenous administration of Tc-40mlabeled red blood cells. RADIOPHARMACEUTICALS:  25.7 mCi Tc-992mertechnetate in-vitro labeled red cells. COMPARISON:  CT angio abdomen and pelvis 03/23/2022 FINDINGS: Normal blood pool distribution of tracer. No abnormal gastrointestinal localization of tracer identified to suggest active GI bleeding. IMPRESSION: Negative GI bleeding scan. Electronically Signed   By: MaLavonia Dana.D.   On: 03/24/2022 13:29   ? ?Lab Results: ?Recent Labs  ?   03/24/22 ?1413 03/24/22 ?2025 03/25/22 ?0359  ?WBC 6.9 7.4 6.2  ?HGB 8.0* 7.8* 8.5*  ?HCT 26.0* 24.5* 27.6*  ?PLT 202 193 181  ? ?BMET ?Recent Labs  ?  03/24/22 ?0037 03/24/22 ?1413 03/25/22 ?0359  ?NA 139 138 143  ?K 4.1 4.0 4.1  ?CL 110 108 111  ?CO2 '24 23 26  '$ ?GLUCOSE 128* 155* 137*  ?BUN '16 14 14  '$ ?CREATININE 1.37* 1.44* 1.39*  ?CALCIUM 8.3* 8.3* 8.9  ? ?LFT ?Recent Labs  ?  03/24/22 ?0037  ?PROT 5.5*  ?ALBUMIN 3.0*  ?AST 8*  ?ALT 10  ?ALKPHOS 56  ?BILITOT 0.6  ? ?PT/INR ?Recent Labs  ?  03/23/22 ?0129 03/24/22 ?0037  ?LABPROT 14.6 15.0  ?INR 1.2 1.2  ? ? ? ? Impression/Plan:  ? ?[age]ear old male history of CVA on Plavix with painless hematochezia.   ?Hospitalization September 2022 for similar presentation thought to be diverticular ?CT angio abdomen pelvis showed diverticulosis and possible features of mild acute sigmoid diverticulitis otherwise unremarkable ?RBC scan Negative ?Started on antibiotics by primary team ?Denies any bloody bowel movements since Sunday 10 AM.  ?CBC on 03/25/2022   ?WBC 6.2 HGB 8.5 (7.9) MCV 83.1 Platelets 181 ?Anemia studies on 03/24/2022  ?Iron 15 Ferritin 8- GIVEN IV IRON YESTERDAY ?01/30/2021 colonoscopy 12 mm polyp, 4 mm polyp severe diverticulosis sigmoid descending transverse and ascending with evidence of diverticular spasm, nonbleeding external and internal hemorrhoids. ?EGD 08/02/21 was negative for any acute upper GI blood loss source ? ?Hemoglobin steady this morning, no signs of rebleeding. ?Can restart Plavix 3 to 5 days since last bloody bowel movement  (04/30)  between 5/3 and 5/5, per medical team and per patient provider. ?Patient's had IDA for quite some time even prior to this GI bleed, has had normal colonoscopy and endoscopy, will plan for outpatient capsule study to evaluate further. ?Patient does have follow-up with hematology, can continue supportive care with them outpatient. ?We will plan for follow-up in our office for capsule endoscopy and then office visit  thereafter with Dr. Silverio Decamp or APP. ? ? ? LOS: 2 days  ? ?Adam Quinn  03/25/2022, 8:11 AM ? ? ? ?

## 2022-03-25 NOTE — Plan of Care (Signed)
?  Problem: Clinical Measurements: ?Goal: Ability to maintain a body temperature in the normal range will improve ?Outcome: Adequate for Discharge ?  ?Problem: Clinical Measurements: ?Goal: Diagnostic test results will improve ?Outcome: Adequate for Discharge ?  ?

## 2022-03-25 NOTE — Discharge Summary (Signed)
?Physician Discharge Summary ?  ?Patient: Adam Quinn MRN: [id number] DOB: [birth year]  ?Admit date:     03/23/2022  ?Discharge date: 03/25/22  ?Discharge Physician: Murray Hodgkins  ? ?PCP: Kelton Pillar, MD  ? ?Recommendations at discharge:  ? ?* GIB (gastrointestinal bleeding) ?--If significantly hemodynamically unstable bleeding, then get CT angiography.   ? ?Acute urinary retention ?--unsuccessful attempts for coude placement, urology consulted and placed catheter 4/30 ?--started on flomax ?--could potentially do voiding trial in 3-4 days as inpatient or outpatient pending clinical course. (May 3 or May 4) ?--discussed with [facility] staff, they will arrange ?Discharge Diagnoses: ?Principal Problem: ?  GIB (gastrointestinal bleeding) ?Active Problems: ?  Acute blood loss anemia ?  Lobar pneumonia ([place]) ?  Acute urinary retention ?  GERD without esophagitis ?  OSA on CPAP ?  Sigmoid diverticulitis ?  Diabetic polyneuropathy associated with type 2 diabetes mellitus ([place]) ?  History of CVA (cerebrovascular accident) ?  CKD stage IIIa ([place]) ?  Depression ?  Diverticulosis ?  Malabsorption of iron ? ?Resolved Problems: ?  * No resolved hospital problems. * ? ?Hospital Course: ?66 year old man PMH previous GI bleed, ischemic colitis, presented with rectal bleeding.  Also an episode of vomiting.  Admitted for acute GI bleed, acute blood loss anemia, left lower lobe pneumonia.  Blood counts stabilized, no recurrent bleeding, pneumonia clinically resolved.  Hospitalization uncomplicated.  Individual issues as below. ? ?* GIB (gastrointestinal bleeding) ?--complicated by clopidogrel for PMH stroke ?--history of rectal bleeding secondary to colonic diverticulosis as well as ischemic colitis ?--Wife reports that his baseline is around 68, has required outpatient IV iron infusions in the past.  Was referred back to hematology when his hemoglobin was at 10 but has not been seen yet. ?--CT bleeding study showed no obvious  acute areas of bleeding. Tagged RBC scan was negative ?--current admission is most likely a result of diverticular disease burden based on his recent colonoscopy ?--next step is video capsule endoscopy as outpatient ?--can resume Plavix per GI between 5/3 and 5/5 ?--If significantly hemodynamically unstable bleeding, then get CT angiography.   ? ?Lobar pneumonia ([place]) ?--no hypoxia; continue empiric antibiotics ? ?Acute blood loss anemia ?--s/p 2 units PRBC with stable Hgb, 8.5 today ?--iron infusion ? ?Acute urinary retention ?--unsuccessful attempts for coude placement, urology consulted and placed catheter 4/30 ?--started on flomax ?--could potentially do voiding trial in 3-4 days as inpatient or outpatient pending clinical course. (May 3 or May 4) ?--discussed with [facility] staff, they will arrange ? ?CKD stage IIIa ([place]) ?--appears to be at baseline ? ?History of CVA (cerebrovascular accident) ?--on clopidogrel as outpatient; wife will discuss with Dr. Leonie Man ? ?Diabetic polyneuropathy associated with type 2 diabetes mellitus ([facility]) ?--CBG stable, continue SSI; resume metformin; resume Trulicity   ? ?OSA on CPAP ?--CPAP ? ?GERD without esophagitis ?--continue PPI ? ?Diverticulosis ?--CT suggested possible diverticulitis -- no symptoms, no evidence clinically ?--Colonoscopy 01/30/21: ?- One 12 mm polyp in the ascending colon, removed with a cold snare. Resected and ?retrieved. ?- One 4 mm polyp in the rectum, removed with a cold snare. Resected and retrieved. ?- Severe diverticulosis in the sigmoid colon, in the descending colon, in the transverse ?colon, in the ascending colon and in the cecum. There was evidence of diverticular spasm. ?Peri-diverticular erythema was seen. There was evidence of an impacted diverticulum. ?- Non-bleeding external and internal hemorrhoids. ? ? ? ? ?  ? ?Consultants:  ?Gastroenterology  ?Urology  ? ?Procedures performed:  ?PROCEDURE ?  prepped and draped the patient in the usual sterile  fashion. A 16 fr coude catheter was carefully inserted and balloon inflated with 10 cc sterile water. It was connected to the appropriate collection bag. There was immediate return of clear/yellow urine ? ?Disposition: Home ?Diet recommendation:  ?Discharge Diet Orders (From admission, onward)  ? ?  Start     Ordered  ? 03/25/22 0000  Diet - low sodium heart healthy       ? 03/25/22 1042  ? ?  ?  ? ?  ? ?Carb modified diet ?DISCHARGE MEDICATION: ?Allergies as of 03/25/2022   ? ?   Reactions  ? Aspirin Anaphylaxis, Hives  ? Shrimp [shellfish Allergy] Hives  ? Just shrimp  ? ?  ? ?  ?Medication List  ?  ? ?TAKE these medications   ? ?Alavert D-12 Hour Allergy/Cong 5-120 MG tablet ?Generic drug: loratadine-pseudoephedrine ?Take 1 tablet by mouth at bedtime. ?  ?azithromycin 500 MG tablet ?Commonly known as: Zithromax ?Take 1 tablet (500 mg total) by mouth daily for 2 days start 5/3. ?  ?Carestart COVID-19 Home Test Kit ?Generic drug: COVID-19 At Home Antigen Test ?Use as directed ?  ?cefdinir 300 MG capsule ?Commonly known as: OMNICEF ?Take 1 capsule (300 mg total) by mouth 2 (two) times daily. Start 5/3 ?  ?clopidogrel 75 MG tablet ?Commonly known as: PLAVIX ?Take 1 tablet (75 mg total) by mouth daily. ?  ?dorzolamide 2 % ophthalmic solution ?Commonly known as: TRUSOPT ?Instill 1 drop in both eyes twice a day ?  ?ELDERBERRY PO ?Take 1 tablet by mouth at bedtime. Gummy ?  ?FreeStyle Libre 2 Sensor Misc ?Apply sensor to the body as directed every 2 weeks to read blood sugar continuously ?  ?gabapentin 300 MG capsule ?Commonly known as: NEURONTIN ?TAKE 1 CAPSULE BY MOUTH ONCE DAILY ?What changed: when to take this ?  ?lansoprazole 30 MG capsule ?Commonly known as: PREVACID ?TAKE 1 CAPSULE BY MOUTH TWICE DAILY ?  ?Lumigan 0.01 % Soln ?Generic drug: bimatoprost ?Instill 1 drop in both eyes at bedtime ?  ?Melatonin 5 MG Chew ?Chew 5 mg by mouth at bedtime. ?  ?metFORMIN 500 MG 24 hr tablet ?Commonly known as:  GLUCOPHAGE-XR ?Take 2 tablets by mouth twice a day ?  ?Pentips 31G X 5 MM Misc ?Generic drug: Insulin Pen Needle ?Use as directed with Tyler Aas ?  ?RA PROBIOTIC GUMMIES PO ?Take 1 capsule by mouth at bedtime. ?  ?sertraline 100 MG tablet ?Commonly known as: ZOLOFT ?Take 1 tablet by mouth once daily ?  ?simvastatin 20 MG tablet ?Commonly known as: ZOCOR ?Take 1 tablet (20 mg total) by mouth daily. ?What changed: when to take this ?  ?sodium bicarbonate 650 MG tablet ?Take 1 tablet by mouth twice a day 90 days ?  ?Tyler Aas FlexTouch 200 UNIT/ML FlexTouch Pen ?Generic drug: insulin degludec ?Inject 50 units under the skin 1 - 2 times per week ?What changed:  ?how much to take ?when to take this ?reasons to take this ?  ?Trulicity 1.5 TD/3.2KG Sopn ?Generic drug: Dulaglutide ?Inject under the skin once a week ?What changed:  ?how much to take ?how to take this ?when to take this ?  ? ?  ? ? Follow-up Information   ? ? Mauri Pole, MD Follow up.   ?Specialty: Gastroenterology ?Why: Office will contact you with an appointment ?Contact information: ?[address] ?[place] [id number] ?[phone] ? ? ?  ?  ? ? Ennever,  Rudell Cobb, MD Follow up.   ?Specialty: Oncology ?Contact information: ?[address] ?[address] ?[place] [address] ?[phone] ? ? ?  ?  ? ? [facility] Follow up.   ?Why: Office will contact you with an appointment for a voiding trial ?Contact information: ?[address] ?[place] ?[phone] ? ?  ?  ? ?  ?  ? ?  ? ?Feels good ?No bleeding  ? ?Discharge Exam: ?Danley Danker Weights  ? 03/23/22 1240  ?Weight: 77.1 kg  ? ?Physical Exam ?Vitals reviewed.  ?Constitutional:   ?   General: He is not in acute distress. ?   Appearance: He is not ill-appearing or toxic-appearing.  ?Cardiovascular:  ?   Rate and Rhythm: Normal rate and regular rhythm.  ?   Heart sounds: No murmur heard. ?Pulmonary:  ?   Effort: Pulmonary effort is normal. No respiratory  distress.  ?   Breath sounds: No wheezing, rhonchi or rales.  ?Neurological:  ?   Mental Status: He is alert.  ?Psychiatric:     ?   Mood and Affect: Mood normal.     ?   Behavior: Behavior normal.  ?  ? ?Condition at d

## 2022-03-26 ENCOUNTER — Telehealth: Payer: Self-pay

## 2022-03-26 NOTE — Telephone Encounter (Signed)
-----   Message from Vladimir Crofts, [place] sent at 03/25/2022 10:04 AM EDT ----- ?Regarding: RE: Schedule outpatient capsule endoscopy. ?Likely today, tomorrow the latest.  ?----- Message ----- ?From: Greggory Keen, LPN ?Sent: 03/25/2022  10:00 AM EDT ?To: Vladimir Crofts, PA-C, Carl Best, RN ?Subject: RE: Schedule outpatient capsule endoscopy.    ? ?When do you think he will be discharged? ?----- Message ----- ?From: Vladimir Crofts, PA-C ?Sent: 03/25/2022   9:24 AM EDT ?To: Greggory Keen, LPN, Carl Best, RN ?Subject: Schedule outpatient capsule endoscopy.        ? ?Please schedule outpatient capsule endoscopy for IDA and 2 to 4 weeks with follow-up appointment outpatient with Dr. Silverio Decamp or an APP to review results.  ?Patient has follow-up with hematology for supportive care in the meantime. ?Thanks! ? ? ?

## 2022-03-26 NOTE — Telephone Encounter (Signed)
Called patient to discuss scheduling and the prep needed. No answer. Left the patient a message of the reason for my call. Asked he return the call or reach out to Dr Silverio Decamp through My Chart. ?

## 2022-03-27 ENCOUNTER — Other Ambulatory Visit: Payer: Self-pay

## 2022-03-27 DIAGNOSIS — K922 Gastrointestinal hemorrhage, unspecified: Secondary | ICD-10-CM

## 2022-03-27 NOTE — Telephone Encounter (Signed)
Spoke with the patient. Schedule for 06/17/49 as per policy to allow 1 week for the prior authorization.  ?Reviewed the written instructions that he will access in his My Chart.  ?Referral sent to coordinator.  ?

## 2022-03-28 ENCOUNTER — Other Ambulatory Visit (HOSPITAL_BASED_OUTPATIENT_CLINIC_OR_DEPARTMENT_OTHER): Payer: Self-pay

## 2022-03-28 DIAGNOSIS — R338 Other retention of urine: Secondary | ICD-10-CM | POA: Diagnosis not present

## 2022-03-28 DIAGNOSIS — E291 Testicular hypofunction: Secondary | ICD-10-CM | POA: Diagnosis not present

## 2022-03-28 MED ORDER — TAMSULOSIN HCL 0.4 MG PO CAPS
ORAL_CAPSULE | ORAL | 5 refills | Status: DC
  Filled 2022-03-28: qty 30, 30d supply, fill #0
  Filled 2022-04-25: qty 30, 30d supply, fill #1
  Filled 2022-05-26: qty 30, 30d supply, fill #2
  Filled 2022-06-24: qty 30, 30d supply, fill #3
  Filled 2022-07-23: qty 30, 30d supply, fill #4

## 2022-04-04 ENCOUNTER — Encounter: Payer: Self-pay | Admitting: Gastroenterology

## 2022-04-04 ENCOUNTER — Ambulatory Visit (INDEPENDENT_AMBULATORY_CARE_PROVIDER_SITE_OTHER): Payer: HMO | Admitting: Gastroenterology

## 2022-04-04 DIAGNOSIS — D500 Iron deficiency anemia secondary to blood loss (chronic): Secondary | ICD-10-CM

## 2022-04-04 DIAGNOSIS — K921 Melena: Secondary | ICD-10-CM | POA: Diagnosis not present

## 2022-04-04 NOTE — Patient Instructions (Signed)

## 2022-04-04 NOTE — Progress Notes (Signed)
SN: [id number] Exp: 2023-08-16 LOT: [id number] Patient arrived for Capsule Endoscopy. Reported the prep went well. This nurse explained dietary restrictions for the next few hours. Patient verbalized understanding. Opened capsule, ensured capsule was flashing prior to the patient swallowing the capsule. Patient swallowed capsule without difficulty. Patient instructed to return to the office at 4:00 pm today for removal of the recording equipment, to call the office with any questions and if no capsule was visualized after 72 hours. No further questions by the conclusion of the visit.

## 2022-04-10 ENCOUNTER — Other Ambulatory Visit (HOSPITAL_BASED_OUTPATIENT_CLINIC_OR_DEPARTMENT_OTHER): Payer: Self-pay

## 2022-04-11 ENCOUNTER — Other Ambulatory Visit (HOSPITAL_BASED_OUTPATIENT_CLINIC_OR_DEPARTMENT_OTHER): Payer: Self-pay

## 2022-04-11 ENCOUNTER — Other Ambulatory Visit: Payer: Self-pay | Admitting: Family

## 2022-04-11 ENCOUNTER — Inpatient Hospital Stay: Payer: HMO

## 2022-04-11 ENCOUNTER — Inpatient Hospital Stay: Payer: HMO | Admitting: Family

## 2022-04-11 ENCOUNTER — Inpatient Hospital Stay: Payer: HMO | Attending: Hematology & Oncology

## 2022-04-11 DIAGNOSIS — G629 Polyneuropathy, unspecified: Secondary | ICD-10-CM | POA: Diagnosis not present

## 2022-04-11 DIAGNOSIS — D500 Iron deficiency anemia secondary to blood loss (chronic): Secondary | ICD-10-CM | POA: Diagnosis not present

## 2022-04-11 DIAGNOSIS — K909 Intestinal malabsorption, unspecified: Secondary | ICD-10-CM | POA: Diagnosis not present

## 2022-04-11 DIAGNOSIS — K922 Gastrointestinal hemorrhage, unspecified: Secondary | ICD-10-CM | POA: Diagnosis not present

## 2022-04-11 LAB — CMP (CANCER CENTER ONLY)
ALT: 9 U/L (ref 0–44)
AST: 10 U/L — ABNORMAL LOW (ref 15–41)
Albumin: 4.6 g/dL (ref 3.5–5.0)
Alkaline Phosphatase: 84 U/L (ref 38–126)
Anion gap: 10 (ref 5–15)
BUN: 15 mg/dL (ref 8–23)
CO2: 28 mmol/L (ref 22–32)
Calcium: 10.4 mg/dL — ABNORMAL HIGH (ref 8.9–10.3)
Chloride: 103 mmol/L (ref 98–111)
Creatinine: 1.3 mg/dL — ABNORMAL HIGH (ref 0.61–1.24)
GFR, Estimated: >60 mL/min (ref >60)
Glucose, Bld: 187 mg/dL — ABNORMAL HIGH (ref 70–99)
Potassium: 4.7 mmol/L (ref 3.5–5.1)
Sodium: 141 mmol/L (ref 135–145)
Total Bilirubin: 0.3 mg/dL (ref 0.3–1.2)
Total Protein: 7.3 g/dL (ref 6.5–8.1)

## 2022-04-11 LAB — CBC WITH DIFFERENTIAL (CANCER CENTER ONLY)
Abs Immature Granulocytes: 0.03 10*3/uL (ref 0.00–0.07)
Basophils Absolute: 0.1 10*3/uL (ref 0.0–0.1)
Basophils Relative: 1 %
Eosinophils Absolute: 0.1 10*3/uL (ref 0.0–0.5)
Eosinophils Relative: 2 %
HCT: 37.3 % — ABNORMAL LOW (ref 39.0–52.0)
Hemoglobin: 11.3 g/dL — ABNORMAL LOW (ref 13.0–17.0)
Immature Granulocytes: 1 %
Lymphocytes Relative: 20 %
Lymphs Abs: 1.2 10*3/uL (ref 0.7–4.0)
MCH: 26.1 pg (ref 26.0–34.0)
MCHC: 30.3 g/dL (ref 30.0–36.0)
MCV: 86.1 fL (ref 80.0–100.0)
Monocytes Absolute: 0.5 10*3/uL (ref 0.1–1.0)
Monocytes Relative: 8 %
Neutro Abs: 4 10*3/uL (ref 1.7–7.7)
Neutrophils Relative %: 68 %
Platelet Count: 334 10*3/uL (ref 150–400)
RBC: 4.33 MIL/uL (ref 4.22–5.81)
RDW: 18.5 % — ABNORMAL HIGH (ref 11.5–15.5)
WBC Count: 5.9 10*3/uL (ref 4.0–10.5)
nRBC: 0 % (ref 0.0–0.2)

## 2022-04-11 LAB — IRON AND IRON BINDING CAPACITY (CC-WL,HP ONLY)
Iron: 32 ug/dL — ABNORMAL LOW (ref 45–182)
Saturation Ratios: 7 % — ABNORMAL LOW (ref 17.9–39.5)
TIBC: 464 ug/dL — ABNORMAL HIGH (ref 250–450)
UIBC: 432 ug/dL

## 2022-04-11 LAB — FERRITIN: Ferritin: 70 ng/mL (ref 24–336)

## 2022-04-14 ENCOUNTER — Inpatient Hospital Stay: Payer: HMO

## 2022-04-14 ENCOUNTER — Inpatient Hospital Stay: Payer: HMO | Admitting: Family

## 2022-04-14 ENCOUNTER — Encounter: Payer: Self-pay | Admitting: Family

## 2022-04-14 VITALS — BP 143/67 | HR 105 | Temp 97.5°F | Resp 18 | Wt 166.1 lb

## 2022-04-14 VITALS — BP 142/55 | HR 89

## 2022-04-14 DIAGNOSIS — D500 Iron deficiency anemia secondary to blood loss (chronic): Secondary | ICD-10-CM

## 2022-04-14 MED ORDER — SODIUM CHLORIDE 0.9 % IV SOLN: INTRAVENOUS | Status: DC

## 2022-04-14 MED ORDER — SODIUM CHLORIDE 0.9 % IV SOLN
510.0000 mg | Freq: Once | INTRAVENOUS | Status: AC
  Administered 2022-04-14: 510 mg via INTRAVENOUS
  Filled 2022-04-14: qty 510

## 2022-04-14 NOTE — Patient Instructions (Signed)

## 2022-04-14 NOTE — Progress Notes (Signed)
Hematology and Oncology Follow Up Visit  Adam Quinn [id number] 08-Dec-1955 66 y.o. 04/14/2022   Principle Diagnosis:  Iron deficiency anemia Erythropoietin deficiency anemia   Current Therapy:        IV iron as indicated    Interim History:  Adam Quinn is here today for follow-up and IV iron. He was recently in the hospital with anemia secondary to GI bleed. His CT bleeding study was negative.  He had a colonoscopy in March of this year which showed severe diverticulosis throughout the colon, non bleeding hemorrhoids and he had 2 polyps removed.  He states that he had the capsule endoscopy last week and is waiting for the results.  He has not noted any obvious blood loss since being home. No abnormal bruising or petechiae.  Her received 2 units of blood and IV iron during admission.  Iron saturation is still low at 7% and ferritin 70.  He is symptomatic with fatigue, dizziness and some mild SOB with exertion.  No fever, chills, n/v, cough, rash, chest pain, palpitations, abdominal pain or changes in bowel or bladder habits.  No swelling or tenderness in his extremities.  Neuropathy in his hands and feet is unchanged from baseline.  No falls or syncope to report.  He has maintained a good appetite and is doing his best to stay well hydrated throughout the day. Weight is 139 lbs.   ECOG Performance Status: 1 - Symptomatic but completely ambulatory  Medications:  Allergies as of 04/14/2022       Reactions   Aspirin Anaphylaxis, Hives   Shrimp [shellfish Allergy] Hives   Just shrimp        Medication List        Accurate as of Apr 14, 2022 11:09 AM. If you have any questions, ask your nurse or doctor.          Alavert D-12 Hour Allergy/Cong 5-120 MG tablet Generic drug: loratadine-pseudoephedrine Take 1 tablet by mouth at bedtime.   Carestart COVID-19 Home Test Kit Generic drug: COVID-19 At Home Antigen Test Use as directed   cefdinir 300 MG capsule Commonly  known as: OMNICEF Take 1 capsule (300 mg total) by mouth 2 (two) times daily. Start 5/3   clopidogrel 75 MG tablet Commonly known as: PLAVIX Take 1 tablet (75 mg total) by mouth daily.   dorzolamide 2 % ophthalmic solution Commonly known as: TRUSOPT Instill 1 drop in both eyes twice a day   ELDERBERRY PO Take 1 tablet by mouth at bedtime. Gummy   FreeStyle Libre 2 Sensor Misc Apply sensor to the body as directed every 2 weeks to read blood sugar continuously   gabapentin 300 MG capsule Commonly known as: NEURONTIN TAKE 1 CAPSULE BY MOUTH ONCE DAILY What changed: when to take this   lansoprazole 30 MG capsule Commonly known as: PREVACID TAKE 1 CAPSULE BY MOUTH TWICE DAILY   Lumigan 0.01 % Soln Generic drug: bimatoprost Instill 1 drop in both eyes at bedtime   Melatonin 5 MG Chew Chew 5 mg by mouth at bedtime.   metFORMIN 500 MG 24 hr tablet Commonly known as: GLUCOPHAGE-XR Take 2 tablets by mouth twice a day   Pentips 31G X 5 MM Misc Generic drug: Insulin Pen Needle Use as directed with Tyler Aas   RA PROBIOTIC GUMMIES PO Take 1 capsule by mouth at bedtime.   sertraline 100 MG tablet Commonly known as: ZOLOFT Take 1 tablet by mouth once daily   simvastatin 20 MG tablet Commonly known  as: ZOCOR Take 1 tablet (20 mg total) by mouth daily. What changed: when to take this   sodium bicarbonate 650 MG tablet Take 1 tablet by mouth twice a day 90 days   tamsulosin 0.4 MG Caps capsule Commonly known as: FLOMAX Take 1 capsule by mouth at bedtime   Tresiba FlexTouch 200 UNIT/ML FlexTouch Pen Generic drug: insulin degludec Inject 50 units under the skin 1 - 2 times per week What changed:  how much to take when to take this reasons to take this   Trulicity 1.5 JX/9.1YN Sopn Generic drug: Dulaglutide Inject under the skin once a week What changed:  how much to take how to take this when to take this        Allergies:  Allergies  Allergen Reactions    Aspirin Anaphylaxis and Hives   Shrimp [Shellfish Allergy] Hives    Just shrimp     Past Medical History, Surgical history, Social history, and Family History were reviewed and updated.  Review of Systems: All other 10 point review of systems is negative.   Physical Exam:  weight is 166 lb 1.3 oz (75.3 kg). His oral temperature is 97.5 F (36.4 C) (abnormal). His blood pressure is 143/67 (abnormal) and his pulse is 105 (abnormal). His respiration is 18 and oxygen saturation is 100%.   Wt Readings from Last 3 Encounters:  04/14/22 166 lb 1.3 oz (75.3 kg)  03/23/22 170 lb (77.1 kg)  01/30/22 165 lb (74.8 kg)    Ocular: Sclerae unicteric, pupils equal, round and reactive to light Ear-nose-throat: Oropharynx clear, dentition fair Lymphatic: No cervical or supraclavicular adenopathy Lungs no rales or rhonchi, good excursion bilaterally Heart regular rate and rhythm, no murmur appreciated Abd soft, nontender, positive bowel sounds MSK no focal spinal tenderness, no joint edema Neuro: non-focal, well-oriented, appropriate affect Breasts: Deferred   Lab Results  Component Value Date   WBC 5.9 04/11/2022   HGB 11.3 (L) 04/11/2022   HCT 37.3 (L) 04/11/2022   MCV 86.1 04/11/2022   PLT 334 04/11/2022   Lab Results  Component Value Date   FERRITIN 70 04/11/2022   IRON 32 (L) 04/11/2022   TIBC 464 (H) 04/11/2022   UIBC 432 04/11/2022   IRONPCTSAT 7 (L) 04/11/2022   Lab Results  Component Value Date   RETICCTPCT 1.3 04/05/2018   RBC 4.33 04/11/2022   No results found for: KPAFRELGTCHN, LAMBDASER, KAPLAMBRATIO No results found for: IGGSERUM, IGA, IGMSERUM No results found for: Odetta Pink, SPEI   Chemistry      Component Value Date/Time   NA 141 04/11/2022 0952   NA 140 05/26/2017 0856   K 4.7 04/11/2022 0952   K 4.2 05/26/2017 0856   CL 103 04/11/2022 0952   CO2 28 04/11/2022 0952   CO2 25 05/26/2017 0856   BUN  15 04/11/2022 0952   BUN 12.7 05/26/2017 0856   CREATININE 1.30 (H) 04/11/2022 0952   CREATININE 1.2 05/26/2017 0856      Component Value Date/Time   CALCIUM 10.4 (H) 04/11/2022 0952   CALCIUM 9.5 05/26/2017 0856   ALKPHOS 84 04/11/2022 0952   ALKPHOS 83 05/26/2017 0856   AST 10 (L) 04/11/2022 0952   AST 16 05/26/2017 0856   ALT 9 04/11/2022 0952   ALT 22 05/26/2017 0856   BILITOT 0.3 04/11/2022 0952   BILITOT 0.28 05/26/2017 0856       Impression and Plan: Adam Quinn is a very pleasant 66 yo  caucasian gentleman with 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  He will receive IV iron replacement starting today.  Follow-up in 8 weeks.   Lottie Dawson, NP 5/22/202311:09 AM

## 2022-04-18 ENCOUNTER — Other Ambulatory Visit (HOSPITAL_BASED_OUTPATIENT_CLINIC_OR_DEPARTMENT_OTHER): Payer: Self-pay

## 2022-04-18 DIAGNOSIS — G4733 Obstructive sleep apnea (adult) (pediatric): Secondary | ICD-10-CM | POA: Diagnosis not present

## 2022-04-24 ENCOUNTER — Other Ambulatory Visit (HOSPITAL_BASED_OUTPATIENT_CLINIC_OR_DEPARTMENT_OTHER): Payer: Self-pay

## 2022-04-25 ENCOUNTER — Other Ambulatory Visit (HOSPITAL_BASED_OUTPATIENT_CLINIC_OR_DEPARTMENT_OTHER): Payer: Self-pay

## 2022-04-25 MED ORDER — SERTRALINE HCL 100 MG PO TABS
100.0000 mg | ORAL_TABLET | Freq: Every day | ORAL | 1 refills | Status: DC
  Filled 2022-04-25: qty 90, 90d supply, fill #0
  Filled 2022-07-23: qty 90, 90d supply, fill #1

## 2022-05-08 DIAGNOSIS — H401132 Primary open-angle glaucoma, bilateral, moderate stage: Secondary | ICD-10-CM | POA: Diagnosis not present

## 2022-05-11 ENCOUNTER — Other Ambulatory Visit (HOSPITAL_BASED_OUTPATIENT_CLINIC_OR_DEPARTMENT_OTHER): Payer: Self-pay

## 2022-05-12 ENCOUNTER — Encounter: Payer: Self-pay | Admitting: Family

## 2022-05-12 ENCOUNTER — Other Ambulatory Visit (HOSPITAL_BASED_OUTPATIENT_CLINIC_OR_DEPARTMENT_OTHER): Payer: Self-pay

## 2022-05-12 MED ORDER — SODIUM BICARBONATE 650 MG PO TABS
ORAL_TABLET | ORAL | 1 refills | Status: DC
  Filled 2022-05-12: qty 180, 90d supply, fill #0
  Filled 2022-08-06: qty 180, 90d supply, fill #1

## 2022-05-13 ENCOUNTER — Other Ambulatory Visit (HOSPITAL_BASED_OUTPATIENT_CLINIC_OR_DEPARTMENT_OTHER): Payer: Self-pay

## 2022-05-19 ENCOUNTER — Encounter: Payer: Self-pay | Admitting: Podiatry

## 2022-05-19 ENCOUNTER — Ambulatory Visit: Payer: HMO | Admitting: Podiatry

## 2022-05-19 ENCOUNTER — Other Ambulatory Visit (HOSPITAL_BASED_OUTPATIENT_CLINIC_OR_DEPARTMENT_OTHER): Payer: Self-pay

## 2022-05-19 ENCOUNTER — Telehealth: Payer: Self-pay | Admitting: Gastroenterology

## 2022-05-19 DIAGNOSIS — M79675 Pain in left toe(s): Secondary | ICD-10-CM | POA: Diagnosis not present

## 2022-05-19 DIAGNOSIS — M79674 Pain in right toe(s): Secondary | ICD-10-CM | POA: Diagnosis not present

## 2022-05-19 DIAGNOSIS — B351 Tinea unguium: Secondary | ICD-10-CM | POA: Diagnosis not present

## 2022-05-19 DIAGNOSIS — N1831 Chronic kidney disease, stage 3a: Secondary | ICD-10-CM | POA: Diagnosis not present

## 2022-05-19 DIAGNOSIS — D689 Coagulation defect, unspecified: Secondary | ICD-10-CM

## 2022-05-19 DIAGNOSIS — E1142 Type 2 diabetes mellitus with diabetic polyneuropathy: Secondary | ICD-10-CM | POA: Diagnosis not present

## 2022-05-19 NOTE — Telephone Encounter (Signed)
Pt calling for his capsule endo results. Please advise.

## 2022-05-20 ENCOUNTER — Other Ambulatory Visit (HOSPITAL_BASED_OUTPATIENT_CLINIC_OR_DEPARTMENT_OTHER): Payer: Self-pay

## 2022-05-20 MED ORDER — GABAPENTIN 300 MG PO CAPS
ORAL_CAPSULE | ORAL | 1 refills | Status: DC
  Filled 2022-05-20: qty 90, 90d supply, fill #0
  Filled 2022-08-13: qty 90, 90d supply, fill #1

## 2022-05-20 NOTE — Telephone Encounter (Signed)
No lesions in small bowel overall was unremarkable study. Please schedule office follow up visit next available appointment to discuss with patient. Thanks

## 2022-05-22 NOTE — Telephone Encounter (Signed)
Spoke with the patient. Advised of the capsule study impressions and the appointment. Patient agrees to the appointment date. No questions. No further blood with bowel movement.

## 2022-05-26 ENCOUNTER — Other Ambulatory Visit (HOSPITAL_BASED_OUTPATIENT_CLINIC_OR_DEPARTMENT_OTHER): Payer: Self-pay

## 2022-05-28 ENCOUNTER — Encounter (INDEPENDENT_AMBULATORY_CARE_PROVIDER_SITE_OTHER): Payer: HMO | Admitting: Ophthalmology

## 2022-05-28 ENCOUNTER — Other Ambulatory Visit (HOSPITAL_BASED_OUTPATIENT_CLINIC_OR_DEPARTMENT_OTHER): Payer: Self-pay

## 2022-06-03 ENCOUNTER — Other Ambulatory Visit (HOSPITAL_BASED_OUTPATIENT_CLINIC_OR_DEPARTMENT_OTHER): Payer: Self-pay

## 2022-06-03 MED ORDER — GABAPENTIN 300 MG PO CAPS: 300.0000 mg | ORAL_CAPSULE | Freq: Every day | ORAL | 1 refills | Status: DC

## 2022-06-04 ENCOUNTER — Encounter (INDEPENDENT_AMBULATORY_CARE_PROVIDER_SITE_OTHER): Payer: Self-pay | Admitting: Ophthalmology

## 2022-06-04 ENCOUNTER — Ambulatory Visit (INDEPENDENT_AMBULATORY_CARE_PROVIDER_SITE_OTHER): Payer: HMO | Admitting: Ophthalmology

## 2022-06-04 DIAGNOSIS — H35351 Cystoid macular degeneration, right eye: Secondary | ICD-10-CM | POA: Diagnosis not present

## 2022-06-04 DIAGNOSIS — H35341 Macular cyst, hole, or pseudohole, right eye: Secondary | ICD-10-CM

## 2022-06-04 DIAGNOSIS — H43811 Vitreous degeneration, right eye: Secondary | ICD-10-CM

## 2022-06-04 DIAGNOSIS — H26491 Other secondary cataract, right eye: Secondary | ICD-10-CM

## 2022-06-04 NOTE — Assessment & Plan Note (Signed)
Stable OD, prefoveal operculum

## 2022-06-04 NOTE — Progress Notes (Signed)
06/04/2022     CHIEF COMPLAINT Patient presents for  Chief Complaint  Patient presents with   Cystoid Macular Edema      HISTORY OF PRESENT ILLNESS: Adam Quinn is a 66 y.o. male who presents to the clinic today for:   HPI   1 YR FU OU OCT. Pt stated he received new glasses but other than that, no changes in vision within the past year.  Pt was hospitalized in September and first of April for G.I. bleed. Pt last A1C was 7.1 in March.  Last edited by Adam Quinn on 06/04/2022  9:14 AM.      Referring physician: Kelton Pillar, MD [address]. [place],  [place] [address]  HISTORICAL INFORMATION:   Selected notes from the MEDICAL RECORD NUMBER    Lab Results  Component Value Date   HGBA1C 6.8 (H) 03/23/2022     CURRENT MEDICATIONS: Current Outpatient Medications (Ophthalmic Drugs)  Medication Sig   bimatoprost (LUMIGAN) 0.01 % SOLN Instill 1 drop in both eyes at bedtime   dorzolamide (TRUSOPT) 2 % ophthalmic solution Instill 1 drop in both eyes twice a day   No current facility-administered medications for this visit. (Ophthalmic Drugs)   Current Outpatient Medications (Other)  Medication Sig   cefdinir (OMNICEF) 300 MG capsule Take 1 capsule (300 mg total) by mouth 2 (two) times daily. Start 5/3   clopidogrel (PLAVIX) 75 MG tablet Take 1 tablet (75 mg total) by mouth daily.   Continuous Blood Gluc Sensor (FREESTYLE LIBRE 2 SENSOR) MISC Apply sensor to the body as directed every 2 weeks to read blood sugar continuously   COVID-19 At Home Antigen Test (CARESTART COVID-19 HOME TEST) KIT Use as directed   Dulaglutide (TRULICITY) 1.5 HB/7.1IR SOPN Inject under the skin once a week (Patient taking differently: Inject 1.5 mg into the skin every Sunday.)   ELDERBERRY PO Take 1 tablet by mouth at bedtime. Gummy   gabapentin (NEURONTIN) 300 MG capsule Take 1 capsule by mouth once daily   gabapentin (NEURONTIN) 300 MG capsule TAKE 1 CAPSULE BY MOUTH ONCE DAILY    insulin degludec (TRESIBA FLEXTOUCH) 200 UNIT/ML FlexTouch Pen Inject 50 units under the skin 1 - 2 times per week (Patient taking differently: Inject 30 Units into the skin daily as needed (if BG is reading 150 or more).)   Insulin Pen Needle 31G X 5 MM MISC Use as directed with Tyler Aas   lansoprazole (PREVACID) 30 MG capsule TAKE 1 CAPSULE BY MOUTH TWICE DAILY   loratadine-pseudoephedrine (ALAVERT D-12 HOUR ALLERGY/CONG) 5-120 MG tablet Take 1 tablet by mouth at bedtime.   Melatonin 5 MG CHEW Chew 5 mg by mouth at bedtime.   metFORMIN (GLUCOPHAGE-XR) 500 MG 24 hr tablet Take 2 tablets by mouth twice a day   Probiotic Product (RA PROBIOTIC GUMMIES PO) Take 1 capsule by mouth at bedtime.   sertraline (ZOLOFT) 100 MG tablet Take 1 tablet by mouth once daily   simvastatin (ZOCOR) 20 MG tablet Take 1 tablet (20 mg total) by mouth daily. (Patient taking differently: Take 20 mg by mouth every evening.)   sodium bicarbonate 650 MG tablet Take 1 tablet by mouth twice a day 90 days   sodium bicarbonate 650 MG tablet Take 1 tablet by mouth 2 times daily   tamsulosin (FLOMAX) 0.4 MG CAPS capsule Take 1 capsule by mouth at bedtime   No current facility-administered medications for this visit. (Other)      REVIEW OF  SYSTEMS: ROS   Positive for: Gastrointestinal Negative for: Constitutional, Neurological, Skin, Genitourinary, Musculoskeletal, HENT, Endocrine, Cardiovascular, Eyes, Respiratory, Psychiatric, Allergic/Imm, Heme/Lymph Last edited by Adam Quinn on 06/04/2022  9:14 AM.       ALLERGIES Allergies  Allergen Reactions   Aspirin Anaphylaxis and Hives   Shrimp [Shellfish Allergy] Hives    Just shrimp     PAST MEDICAL HISTORY Past Medical History:  Diagnosis Date   Allergy    seasonal   Anxiety    Cataract    cataract surgery bilaterally resolved issues   Central scotoma    Chronic kidney disease    small protien showing uses Lisinopril for this   Diverticulitis    Diverticulosis     DM (diabetes mellitus) ([place])    GERD (gastroesophageal reflux disease)    Glaucoma    History of colon polyps    History of hiatal hernia    History of kidney stones    x1   Iron deficiency anemia    Iron infusion 04/29/2019   Neuropathy    OSA on CPAP    Stroke ([place])    Wears glasses    READING   Past Surgical History:  Procedure Laterality Date   cataract surgery     bilateral   COLONOSCOPY     COLONOSCOPY N/A 06/04/2015   Procedure: COLONOSCOPY;  Surgeon: Inda Castle, MD;  Location: [facility];  Service: Endoscopy;  Laterality: N/A;   COLONOSCOPY WITH PROPOFOL N/A 05/09/2019   Procedure: COLONOSCOPY WITH PROPOFOL;  Surgeon: Doran Stabler, MD;  Location: [facility];  Service: Gastroenterology;  Laterality: N/A;   COLONOSCOPY WITH PROPOFOL N/A 01/30/2022   Procedure: COLONOSCOPY WITH PROPOFOL;  Surgeon: Mauri Pole, MD;  Location: [facility];  Service: Endoscopy;  Laterality: N/A;   ESOPHAGOGASTRODUODENOSCOPY N/A 06/04/2015   Procedure: ESOPHAGOGASTRODUODENOSCOPY (EGD);  Surgeon: Inda Castle, MD;  Location: [facility];  Service: Endoscopy;  Laterality: N/A;   ESOPHAGOGASTRODUODENOSCOPY (EGD) WITH PROPOFOL N/A 08/02/2021   Procedure: ESOPHAGOGASTRODUODENOSCOPY (EGD) WITH PROPOFOL;  Surgeon: Mauri Pole, MD;  Location: [facility];  Service: Endoscopy;  Laterality: N/A;   POLYPECTOMY  01/30/2022   Procedure: POLYPECTOMY;  Surgeon: Mauri Pole, MD;  Location: [facility];  Service: Endoscopy;;   UPPER GASTROINTESTINAL ENDOSCOPY     WISDOM TOOTH EXTRACTION     with sedation    FAMILY HISTORY Family History  Problem Relation Age of Onset   Colon polyps Father    Lung cancer Father    Diabetes Mother    Dementia Mother    Diabetes Sister        x 2   Stroke Maternal Grandmother    Stroke Paternal Grandfather    Colon cancer Neg Hx    Rectal cancer Neg Hx    Stomach cancer Neg Hx    Pancreatic cancer Neg Hx     SOCIAL  HISTORY Social History   Tobacco Use   Smoking status: Former    Types: Cigarettes    Quit date: 10/04/1997    Years since quitting: 24.6   Smokeless tobacco: Never  Vaping Use   Vaping Use: Never used  Substance Use Topics   Alcohol use: No   Drug use: No         OPHTHALMIC EXAM:  Base Eye Exam     Visual Acuity (ETDRS)       Right Left   Dist [place] 20/60 20/80   Dist ph  20/40 20/40 -2  Tonometry (Tonopen, 9:19 AM)       Right Left   Pressure 22 23         Pupils       Pupils Dark Light Shape React APD   Right PERRL 3 2 Round Slow None   Left PERRL 3 2 Round Slow None         Visual Fields       Left Right    Full Full         Extraocular Movement       Right Left    Full Full         Neuro/Psych     Oriented x3: Yes   Mood/Affect: Normal         Dilation     Both eyes: 1.0% Mydriacyl, 2.5% Phenylephrine @ 9:19 AM           Slit Lamp and Fundus Exam     External Exam       Right Left   External Normal Normal         Slit Lamp Exam       Right Left   Lids/Lashes Normal Normal   Conjunctiva/Sclera White and quiet White and quiet   Cornea Clear Clear   Anterior Chamber Deep and quiet Deep and quiet   Iris Round and reactive Round and reactive   Lens Posterior chamber intraocular lens, lens pearls anterior capsule in the visual axis Posterior chamber intraocular lens   Anterior Vitreous Normal Normal         Fundus Exam       Right Left   Posterior Vitreous Posterior vitreous detachment, prefoveal operculum Normal   Disc Normal Normal   C/D Ratio 0.45 0.45   Macula Normal Normal   Vessels no DR no DR   Periphery Normal Normal            IMAGING AND PROCEDURES  Imaging and Procedures for 06/04/22  OCT, Retina - OU - Both Eyes       Right Eye Quality was good. Scan locations included subfoveal. Central Foveal Thickness: 233. Progression has no prior data. Findings include abnormal foveal  contour, cystoid macular edema.   Left Eye Quality was good. Central Foveal Thickness: 232. Findings include abnormal foveal contour, vitreomacular adhesion .   Notes OD with incidental posterior vitreous detachment, with perifoveal operculum.  Thus this could have been a spontaneous closure prior macular hole right eye with residual intraretinal fluid, CME.  CME that is present in the foveal avascular zone is very minor and none distorting     Yag Capsulotomy - OD - Right Eye       Time Out Confirmed correct patient, procedure, site, and patient consented.   Anesthesia Topical anesthesia was used. Anesthesia medications included Brimonidine 0.2%, Proparacaine.   Laser Information The type of laser was yag. Total spots was 88. The energy was 1.1 mj. Total energy was 94 mj.   Post-op The patient tolerated the procedure well. There were no complications. The patient received written and verbal post procedure care education.   Notes Anterior lens capsules in the visual axis as well as constriction of the anterior capsule.  The lens pearls were liberated from the visual axis anterior to the IOL as well as transection at the 10 o'clock position of tight anterior fibrotic lens capsular edge.             ASSESSMENT/PLAN:  Secondary membrane, obscuring vision, right Anterior capsular pearls  now in the visual axis.  Potential for visual acuity improvement in the right eye.  Certainly the previous partial macular hole spontaneously closed was to limit acuity  We will recommend YAG laser capsulotomy right eye  Posterior vitreous detachment of right eye Stable OD, prefoveal operculum  Lamellar macular hole, right eye OD will observe.     ICD-10-CM   1. Secondary membrane, obscuring vision, right  H26.491 Yag Capsulotomy - OD - Right Eye    2. Cystoid macular edema, right eye  H35.351 OCT, Retina - OU - Both Eyes    3. Posterior vitreous detachment of right eye  H43.811      4. Lamellar macular hole, right eye  H35.341       1.  Pseudo CME OD with lamellar macular hole right eye.  Otherwise stable.  2.Prefoveal operculum confirms these findings.  Negative Watzke sign.  No full-thickness gap  3.  OD with a prefoveal shadowing effect on the macula but not symptomatic to the patient will continue to observe  4.  Follow-up with [facility] as scheduled  Ophthalmic Meds Ordered this visit:  No orders of the defined types were placed in this encounter.      Return in about 4 months (around 10/05/2022) for DILATE OU, COLOR FP, OCT.  There are no Patient Instructions on file for this visit.   Explained the diagnoses, plan, and follow up with the patient and they expressed understanding.  Patient expressed understanding of the importance of proper follow up care.   Adam Quinn M.D. Diseases & Surgery of the Retina and Vitreous [facility] [place] 06/04/22     Abbreviations: M myopia (nearsighted); A astigmatism; H hyperopia (farsighted); P presbyopia; Mrx spectacle prescription;  CTL contact lenses; OD right eye; OS left eye; OU both eyes  XT exotropia; ET esotropia; PEK punctate epithelial keratitis; PEE punctate epithelial erosions; DES dry eye syndrome; MGD meibomian gland dysfunction; ATs artificial tears; PFAT's preservative free artificial tears; [place] nuclear sclerotic cataract; PSC posterior subcapsular cataract; ERM epi-retinal membrane; PVD posterior vitreous detachment; RD retinal detachment; DM diabetes mellitus; DR diabetic retinopathy; NPDR non-proliferative diabetic retinopathy; PDR proliferative diabetic retinopathy; CSME clinically significant macular edema; DME diabetic macular edema; dbh dot blot hemorrhages; CWS cotton wool spot; POAG primary open angle glaucoma; C/D cup-to-disc ratio; HVF humphrey visual field; GVF goldmann visual field; OCT optical coherence tomography; IOP intraocular pressure; BRVO Branch retinal vein  occlusion; CRVO central retinal vein occlusion; CRAO central retinal artery occlusion; BRAO branch retinal artery occlusion; RT retinal tear; SB scleral buckle; PPV pars plana vitrectomy; VH Vitreous hemorrhage; PRP panretinal laser photocoagulation; IVK intravitreal kenalog; VMT vitreomacular traction; MH Macular hole;  NVD neovascularization of the disc; NVE neovascularization elsewhere; AREDS age related eye disease study; ARMD age related macular degeneration; POAG primary open angle glaucoma; EBMD epithelial/anterior basement membrane dystrophy; ACIOL anterior chamber intraocular lens; IOL intraocular lens; PCIOL posterior chamber intraocular lens; Phaco/IOL phacoemulsification with intraocular lens placement; [place] photorefractive keratectomy; LASIK laser assisted in situ keratomileusis; HTN hypertension; DM diabetes mellitus; COPD chronic obstructive pulmonary disease

## 2022-06-04 NOTE — Assessment & Plan Note (Signed)
Anterior capsular pearls now in the visual axis.  Potential for visual acuity improvement in the right eye.  Certainly the previous partial macular hole spontaneously closed was to limit acuity  We will recommend YAG laser capsulotomy right eye

## 2022-06-04 NOTE — Assessment & Plan Note (Signed)
OD will observe.

## 2022-06-05 ENCOUNTER — Encounter: Payer: Self-pay | Admitting: Family

## 2022-06-06 ENCOUNTER — Other Ambulatory Visit (HOSPITAL_BASED_OUTPATIENT_CLINIC_OR_DEPARTMENT_OTHER): Payer: Self-pay

## 2022-06-09 ENCOUNTER — Other Ambulatory Visit (HOSPITAL_BASED_OUTPATIENT_CLINIC_OR_DEPARTMENT_OTHER): Payer: Self-pay

## 2022-06-11 NOTE — Progress Notes (Deleted)
[facility]  PATIENT: Adam Quinn DOB: 06-27-1956   REASON FOR VISIT: Stroke and CPAP follow-up HISTORY FROM: Patient  No chief complaint on file.    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  Today, 06/11/2022, Mr. Lubrano is being seen for yearly stroke and CPAP follow-up previously seen 06/12/2021  Stroke: Patient of Dr. Leonie Man.  Residual deficits of right side lips and right thumb.  He does report occasional imbalance/lightheadedness which is not new and denies worsening.  Denies new stroke/TIA symptoms.  Continues on clopidogrel and simvastatin for secondary stroke prevention. Blood pressure today 146/82.  Glucose levels stable with recent A1c 5.5 (per patient report). Report recent adjustments to insulin regimen due to occasional episodes of hypoglycemia. Continues to follow closely with PCP Dr. Coral Spikes for HTN, HLD and DM management.  No further concerns at this time.  OSA on CPAP: Patient Dr. Brett Fairy.  He continues to tolerate CPAP well without difficulty.  He is in need of new supplies which he believes is likely contributing to his leak.  No CPAP related concerns at this time.        REVIEW OF SYSTEMS: Full 14 system review of systems performed and notable only for those listed, all others are neg: Numbness, tingling, and sleep apnea  Epworth Sleepiness Scale How likely are you to doze in the following situations:  0 = not likely, 1 = slight chance, 2 = moderate chance, 3 = high chance   Sitting and Reading? 0 Watching Television? 2 Sitting inactive in a public place (theater or meeting)? 0 Lying down in the afternoon when circumstances permit? 2 Sitting and talking to someone? 0 Sitting  quietly after lunch without alcohol? 0 In a car, while stopped for a few minutes in traffic? 0 As a passenger in a car for an hour without a break? 0  Total = 4      ALLERGIES: Allergies  Allergen Reactions   Aspirin Anaphylaxis and Hives   Shrimp [Shellfish Allergy] Hives    Just shrimp     HOME MEDICATIONS: Outpatient Medications Prior to Visit  Medication Sig Dispense Refill   bimatoprost (LUMIGAN) 0.01 % SOLN Instill 1 drop in both eyes at bedtime 7.5 mL 3   cefdinir (OMNICEF) 300 MG capsule Take 1 capsule (300 mg total) by mouth 2 (two) times daily. Start 5/3 4 capsule 0   clopidogrel (PLAVIX) 75 MG tablet Take 1 tablet (75 mg total) by mouth daily. 90 tablet 1   Continuous Blood Gluc Sensor (FREESTYLE LIBRE 2 SENSOR) MISC Apply sensor to the body as directed every 2 weeks to read blood sugar continuously 6 each 3   COVID-19 At Home Antigen Test (CARESTART COVID-19 HOME TEST) KIT Use as directed 4 each 0   dorzolamide (TRUSOPT) 2 % ophthalmic solution Instill 1 drop in both eyes twice a day 10 mL 4   Dulaglutide (TRULICITY) 1.5 XY/5.8PF SOPN Inject under the skin once a week (Patient taking differently: Inject 1.5 mg into the skin every Sunday.) 6 mL 4   ELDERBERRY PO Take 1 tablet by mouth at bedtime. Gummy     gabapentin (NEURONTIN) 300 MG capsule Take 1 capsule by mouth once daily 90 capsule 1   gabapentin (NEURONTIN) 300 MG capsule TAKE  1 CAPSULE BY MOUTH ONCE DAILY 90 capsule 1   insulin degludec (TRESIBA FLEXTOUCH) 200 UNIT/ML FlexTouch Pen Inject 50 units under the skin 1 - 2 times per week (Patient taking differently: Inject 30 Units into the skin daily as needed (if BG is reading 150 or more).) 9 mL 1   Insulin Pen Needle 31G X 5 MM MISC Use as directed with Tresiba 100 each 1   lansoprazole (PREVACID) 30 MG capsule TAKE 1 CAPSULE BY MOUTH TWICE DAILY 180 capsule 1   loratadine-pseudoephedrine (ALAVERT D-12 HOUR ALLERGY/CONG) 5-120 MG tablet Take 1 tablet by mouth at  bedtime.     Melatonin 5 MG CHEW Chew 5 mg by mouth at bedtime.     metFORMIN (GLUCOPHAGE-XR) 500 MG 24 hr tablet Take 2 tablets by mouth twice a day 360 tablet 1   Probiotic Product (RA PROBIOTIC GUMMIES PO) Take 1 capsule by mouth at bedtime.     sertraline (ZOLOFT) 100 MG tablet Take 1 tablet by mouth once daily 90 tablet 1   simvastatin (ZOCOR) 20 MG tablet Take 1 tablet (20 mg total) by mouth daily. (Patient taking differently: Take 20 mg by mouth every evening.) 90 tablet 3   sodium bicarbonate 650 MG tablet Take 1 tablet by mouth twice a day 90 days 180 tablet 2   sodium bicarbonate 650 MG tablet Take 1 tablet by mouth 2 times daily 180 tablet 1   tamsulosin (FLOMAX) 0.4 MG CAPS capsule Take 1 capsule by mouth at bedtime 30 capsule 5   No facility-administered medications prior to visit.    PAST MEDICAL HISTORY: Past Medical History:  Diagnosis Date   Allergy    seasonal   Anxiety    Cataract    cataract surgery bilaterally resolved issues   Central scotoma    Chronic kidney disease    small protien showing uses Lisinopril for this   Diverticulitis    Diverticulosis    DM (diabetes mellitus) ([place])    GERD (gastroesophageal reflux disease)    Glaucoma    History of colon polyps    History of hiatal hernia    History of kidney stones    x1   Iron deficiency anemia    Iron infusion 04/29/2019   Neuropathy    OSA on CPAP    Stroke ([place])    Wears glasses    READING    PAST SURGICAL HISTORY: Past Surgical History:  Procedure Laterality Date   cataract surgery     bilateral   COLONOSCOPY     COLONOSCOPY N/A 06/04/2015   Procedure: COLONOSCOPY;  Surgeon: Inda Castle, MD;  Location: [facility];  Service: Endoscopy;  Laterality: N/A;   COLONOSCOPY WITH PROPOFOL N/A 05/09/2019   Procedure: COLONOSCOPY WITH PROPOFOL;  Surgeon: Doran Stabler, MD;  Location: [facility];  Service: Gastroenterology;  Laterality: N/A;   COLONOSCOPY WITH PROPOFOL N/A 01/30/2022    Procedure: COLONOSCOPY WITH PROPOFOL;  Surgeon: Mauri Pole, MD;  Location: [facility];  Service: Endoscopy;  Laterality: N/A;   ESOPHAGOGASTRODUODENOSCOPY N/A 06/04/2015   Procedure: ESOPHAGOGASTRODUODENOSCOPY (EGD);  Surgeon: Inda Castle, MD;  Location: [facility];  Service: Endoscopy;  Laterality: N/A;   ESOPHAGOGASTRODUODENOSCOPY (EGD) WITH PROPOFOL N/A 08/02/2021   Procedure: ESOPHAGOGASTRODUODENOSCOPY (EGD) WITH PROPOFOL;  Surgeon: Mauri Pole, MD;  Location: [facility];  Service: Endoscopy;  Laterality: N/A;   POLYPECTOMY  01/30/2022   Procedure: POLYPECTOMY;  Surgeon: Mauri Pole, MD;  Location: [facility];  Service: Endoscopy;;  UPPER GASTROINTESTINAL ENDOSCOPY     WISDOM TOOTH EXTRACTION     with sedation    FAMILY HISTORY: Family History  Problem Relation Age of Onset   Colon polyps Father    Lung cancer Father    Diabetes Mother    Dementia Mother    Diabetes Sister        x 2   Stroke Maternal Grandmother    Stroke Paternal Grandfather    Colon cancer Neg Hx    Rectal cancer Neg Hx    Stomach cancer Neg Hx    Pancreatic cancer Neg Hx     SOCIAL HISTORY: Social History   Socioeconomic History   Marital status: Married    Spouse name: Not on file   Number of children: 3   Years of education: Not on file   Highest education level: Not on file  Occupational History   Occupation: Optometrist  Tobacco Use   Smoking status: Former    Types: Cigarettes    Quit date: 10/04/1997    Years since quitting: 24.7   Smokeless tobacco: Never  Vaping Use   Vaping Use: Never used  Substance and Sexual Activity   Alcohol use: No   Drug use: No   Sexual activity: Not on file  Other Topics Concern   Not on file  Social History Narrative   Not on file   Social Determinants of Health   Financial Resource Strain: Not on file  Food Insecurity: No Food Insecurity (08/08/2020)   Hunger Vital Sign    Worried About Running Out of Food in the  Last Year: Never true    Ran Out of Food in the Last Year: Never true  Transportation Needs: No Transportation Needs (08/08/2020)   PRAPARE - Hydrologist (Medical): No    Lack of Transportation (Non-Medical): No  Physical Activity: Not on file  Stress: Not on file  Social Connections: Not on file  Intimate Partner Violence: Not on file     PHYSICAL EXAM  There were no vitals filed for this visit.  There is no height or weight on file to calculate BMI.  Generalized: Obese middle-aged Caucasian male in no acute distress  Head: normocephalic and atraumatic   Neck: Supple, no carotid bruits  Cardiac: Regular rate rhythm, no murmur  Musculoskeletal: No deformity   Neurological examination   Mentation: Alert oriented to time, place, Attention span and concentration appropriate. Recent and remote memory intact.  Follows all commands speech and language fluent.  Cranial nerve II-XII: Pupils were equal round reactive to light extraocular movements were full, visual field were full on confrontational test. Facial sensation and strength were normal. hearing was intact to finger rubbing bilaterally. Uvula tongue midline. head turning and shoulder shrug were normal and symmetric.Tongue protrusion into cheek strength was normal. Motor: normal bulk and tone, full strength in the BUE, BLE, Sensory: normal and symmetric to light touch, in the upper and lower extremities Coordination: finger-nose-finger, heel-to-shin bilaterally, no dysmetria Reflexes: 1+ upper lower and symmetric, plantar responses were flexor bilaterally. Gait and Station: Rising up from seated position without assistance, normal stance,  moderate stride, good arm swing, smooth turning, able to perform tiptoe, and heel walking without difficulty. Tandem gait is performed with moderate difficulty     ASSESSMENT AND PLAN 86 year Caucasian male with history of multiple bilateral subcortical lacunar  infarcts in 2017, left thalamic infarct 06/2019 secondary to small vessel disease with residual right-sided numbness, vertebral  artery stenosis, DM, HTN, HLD and OSA on CPAP.  Continues to be followed in this office for stroke and CPAP follow-up    History of multiple strokes: -Continue Plavix and simvastatin for secondary stroke prevention -Continue to follow with PCP for HTN, HLD and DM management -Chronic vertebral artery stenosis with prior CUS 07/2019 no evidence of stenosis bilateral ICA with right vertebral artery antegrade flow in left vertebral artery not visualized.  Remains asymptomatic and advised importance of continuing clopidogrel and statin along with managing stroke risk factors and ongoing use of CPAP.  No indication at this time for repeat or surveillance monitoring as stenosis is chronic and he is currently asymptomatic. -maintain strict control of hypertension with blood pressure goal below 130/90, diabetes with hemoglobin A1c goal below 7% and lipids with LDL cholesterol goal below 70 mg/dL.   OSA on CPAP: -Encouraged ongoing excellent compliance at 97% with optimal residual AHI of 2.4 -No change to settings at this time as not indicated -Advised to continue to follow with DME company [employer] for any needed supplies or CPAP related concerns   Follow-up in 1 year or call earlier if needed  CC:  [facility] provider: Dr. Leonie Man (stroke) [place] provider: Dr. Brett Fairy (sleep) Doristine Bosworth, Michell Heinrich, MD    I spent 36 minutes of face-to-face and non-face-to-face time with patient.  This included previsit chart review, lab review, study review, order entry, electronic health record documentation, patient education regarding prior stroke history including secondary stroke prevention measures and aggressive stroke risk factor management, residual deficits, review of CPAP compliance report and further discussion and answered all other questions to patient satisfaction  Frann Rider,  AGNP-BC  [facility] [address] [place], [place] [address]  Phone [phone] Fax [phone] Note: This document was prepared with digital dictation and possible smart phrase technology. Any transcriptional errors that result from this process are unintentional.

## 2022-06-12 ENCOUNTER — Ambulatory Visit: Payer: HMO | Admitting: Adult Health

## 2022-06-12 ENCOUNTER — Telehealth: Payer: Self-pay | Admitting: Adult Health

## 2022-06-12 ENCOUNTER — Encounter: Payer: Self-pay | Admitting: Family

## 2022-06-12 NOTE — Telephone Encounter (Signed)
LVM and mychart msg informing pt of need to reschedule 7/20 appointment - Frann Rider out

## 2022-06-13 ENCOUNTER — Inpatient Hospital Stay (HOSPITAL_BASED_OUTPATIENT_CLINIC_OR_DEPARTMENT_OTHER): Payer: HMO | Admitting: Family

## 2022-06-13 ENCOUNTER — Encounter: Payer: Self-pay | Admitting: Family

## 2022-06-13 ENCOUNTER — Inpatient Hospital Stay: Payer: HMO | Attending: Hematology & Oncology

## 2022-06-13 ENCOUNTER — Other Ambulatory Visit: Payer: Self-pay

## 2022-06-13 VITALS — BP 132/62 | HR 83 | Temp 97.8°F | Resp 18 | Ht 68.0 in | Wt 167.1 lb

## 2022-06-13 DIAGNOSIS — K922 Gastrointestinal hemorrhage, unspecified: Secondary | ICD-10-CM | POA: Diagnosis not present

## 2022-06-13 DIAGNOSIS — K909 Intestinal malabsorption, unspecified: Secondary | ICD-10-CM | POA: Insufficient documentation

## 2022-06-13 DIAGNOSIS — D500 Iron deficiency anemia secondary to blood loss (chronic): Secondary | ICD-10-CM | POA: Insufficient documentation

## 2022-06-13 LAB — CBC WITH DIFFERENTIAL (CANCER CENTER ONLY)
Abs Immature Granulocytes: 0.06 10*3/uL (ref 0.00–0.07)
Basophils Absolute: 0 10*3/uL (ref 0.0–0.1)
Basophils Relative: 1 %
Eosinophils Absolute: 0.1 10*3/uL (ref 0.0–0.5)
Eosinophils Relative: 2 %
HCT: 38.7 % — ABNORMAL LOW (ref 39.0–52.0)
Hemoglobin: 11.9 g/dL — ABNORMAL LOW (ref 13.0–17.0)
Immature Granulocytes: 2 %
Lymphocytes Relative: 25 %
Lymphs Abs: 1 10*3/uL (ref 0.7–4.0)
MCH: 27.3 pg (ref 26.0–34.0)
MCHC: 30.7 g/dL (ref 30.0–36.0)
MCV: 88.8 fL (ref 80.0–100.0)
Monocytes Absolute: 0.6 10*3/uL (ref 0.1–1.0)
Monocytes Relative: 14 %
Neutro Abs: 2.3 10*3/uL (ref 1.7–7.7)
Neutrophils Relative %: 56 %
Platelet Count: 224 10*3/uL (ref 150–400)
RBC: 4.36 MIL/uL (ref 4.22–5.81)
RDW: 15.9 % — ABNORMAL HIGH (ref 11.5–15.5)
Smear Review: NORMAL
WBC Count: 4 10*3/uL (ref 4.0–10.5)
nRBC: 0 % (ref 0.0–0.2)

## 2022-06-13 LAB — IRON AND IRON BINDING CAPACITY (CC-WL,HP ONLY)
Iron: 24 ug/dL — ABNORMAL LOW (ref 45–182)
Saturation Ratios: 5 % — ABNORMAL LOW (ref 17.9–39.5)
TIBC: 496 ug/dL — ABNORMAL HIGH (ref 250–450)
UIBC: 472 ug/dL — ABNORMAL HIGH (ref 117–376)

## 2022-06-13 LAB — CMP (CANCER CENTER ONLY)
ALT: 15 U/L (ref 0–44)
AST: 13 U/L — ABNORMAL LOW (ref 15–41)
Albumin: 4.7 g/dL (ref 3.5–5.0)
Alkaline Phosphatase: 86 U/L (ref 38–126)
Anion gap: 8 (ref 5–15)
BUN: 14 mg/dL (ref 8–23)
CO2: 28 mmol/L (ref 22–32)
Calcium: 9.9 mg/dL (ref 8.9–10.3)
Chloride: 107 mmol/L (ref 98–111)
Creatinine: 1.32 mg/dL — ABNORMAL HIGH (ref 0.61–1.24)
GFR, Estimated: 59 mL/min — ABNORMAL LOW (ref >60)
Glucose, Bld: 142 mg/dL — ABNORMAL HIGH (ref 70–99)
Potassium: 4.7 mmol/L (ref 3.5–5.1)
Sodium: 143 mmol/L (ref 135–145)
Total Bilirubin: 0.2 mg/dL — ABNORMAL LOW (ref 0.3–1.2)
Total Protein: 7 g/dL (ref 6.5–8.1)

## 2022-06-13 LAB — RETICULOCYTES
Immature Retic Fract: 9.5 % (ref 2.3–15.9)
RBC.: 4.32 MIL/uL (ref 4.22–5.81)
Retic Count, Absolute: 42.3 10*3/uL (ref 19.0–186.0)
Retic Ct Pct: 1 % (ref 0.4–3.1)

## 2022-06-13 LAB — FERRITIN: Ferritin: 9 ng/mL — ABNORMAL LOW (ref 24–336)

## 2022-06-13 NOTE — Progress Notes (Signed)
Hematology and Oncology Follow Up Visit  Adam Quinn [id number] [birth year] 66 y.o. 06/13/2022   Principle Diagnosis:  Iron deficiency anemia Erythropoietin deficiency anemia   Current Therapy:        IV iron as indicated    Interim History:  Adam Quinn is here today for follow-up. He is doing fairly well but does note fatigue at times. He and his wife have been caring for his mother in law and she recently passed away.  He has noted occasional blood in his stool. He has started hydrating with Body Armor ans taking miralax gummies. He feels that bouts with diverticulitis brought on by dehydration has caused much of the GI blood loss.  No other blood loss noted. He does bruise easily on Plavix. No petechiae.  No swelling, tenderness, numbness or tingling in his extremities.  No falls or syncope reported.  No fever, chills, n/v, cough, rash, SOB, chest pain, palpitations, abdominal pain or changes in bowel or bladder habits.  He has occasional dizziness with bouts of vertigo.  Appetite and hydration are good. Weight is stable at 167 lbs.   ECOG Performance Status: 1 - Symptomatic but completely ambulatory  Medications:  Allergies as of 06/13/2022       Reactions   Aspirin Anaphylaxis, Hives   Shrimp [shellfish Allergy] Hives   Just shrimp        Medication List        Accurate as of June 13, 2022 10:48 AM. If you have any questions, ask your nurse or doctor.          Alavert D-12 Hour Allergy/Cong 5-120 MG tablet Generic drug: loratadine-pseudoephedrine Take 1 tablet by mouth at bedtime.   Carestart COVID-19 Home Test Kit Generic drug: COVID-19 At Home Antigen Test Use as directed   cefdinir 300 MG capsule Commonly known as: OMNICEF Take 1 capsule (300 mg total) by mouth 2 (two) times daily. Start 5/3   clopidogrel 75 MG tablet Commonly known as: PLAVIX Take 1 tablet (75 mg total) by mouth daily.   dorzolamide 2 % ophthalmic solution Commonly known as:  TRUSOPT Instill 1 drop in both eyes twice a day   ELDERBERRY PO Take 1 tablet by mouth at bedtime. Gummy   FreeStyle Libre 2 Sensor Misc Apply sensor to the body as directed every 2 weeks to read blood sugar continuously   gabapentin 300 MG capsule Commonly known as: NEURONTIN Take 1 capsule by mouth once daily   gabapentin 300 MG capsule Commonly known as: NEURONTIN TAKE 1 CAPSULE BY MOUTH ONCE DAILY   lansoprazole 30 MG capsule Commonly known as: PREVACID TAKE 1 CAPSULE BY MOUTH TWICE DAILY   Lumigan 0.01 % Soln Generic drug: bimatoprost Instill 1 drop in both eyes at bedtime   Melatonin 5 MG Chew Chew 5 mg by mouth at bedtime.   metFORMIN 500 MG 24 hr tablet Commonly known as: GLUCOPHAGE-XR Take 2 tablets by mouth twice a day   Pentips 31G X 5 MM Misc Generic drug: Insulin Pen Needle Use as directed with Tyler Aas   RA PROBIOTIC GUMMIES PO Take 1 capsule by mouth at bedtime.   sertraline 100 MG tablet Commonly known as: ZOLOFT Take 1 tablet by mouth once daily   simvastatin 20 MG tablet Commonly known as: ZOCOR Take 1 tablet (20 mg total) by mouth daily. What changed: when to take this   sodium bicarbonate 650 MG tablet Take 1 tablet by mouth 2 times daily What changed: Another medication with  the same name was removed. Continue taking this medication, and follow the directions you see here. Changed by: Lottie Dawson, NP   tamsulosin 0.4 MG Caps capsule Commonly known as: FLOMAX Take 1 capsule by mouth at bedtime   Tresiba FlexTouch 200 UNIT/ML FlexTouch Pen Generic drug: insulin degludec Inject 50 units under the skin 1 - 2 times per week What changed:  how much to take when to take this reasons to take this   Trulicity 1.5 HY/8.6VH Sopn Generic drug: Dulaglutide Inject under the skin once a week What changed:  how much to take how to take this when to take this        Allergies:  Allergies  Allergen Reactions   Aspirin Anaphylaxis and  Hives   Shrimp [Shellfish Allergy] Hives    Just shrimp     Past Medical History, Surgical history, Social history, and Family History were reviewed and updated.  Review of Systems: All other 10 point review of systems is negative.   Physical Exam:  height is '5\' 8"'  (1.727 m) and weight is 167 lb 1.3 oz (75.8 kg). His oral temperature is 97.8 F (36.6 C). His blood pressure is 132/62 and his pulse is 83. His respiration is 18 and oxygen saturation is 100%.   Wt Readings from Last 3 Encounters:  06/13/22 167 lb 1.3 oz (75.8 kg)  04/14/22 166 lb 1.3 oz (75.3 kg)  03/23/22 170 lb (77.1 kg)    Ocular: Sclerae unicteric, pupils equal, round and reactive to light Ear-nose-throat: Oropharynx clear, dentition fair Lymphatic: No cervical or supraclavicular adenopathy Lungs no rales or rhonchi, good excursion bilaterally Heart regular rate and rhythm, no murmur appreciated Abd soft, nontender, positive bowel sounds MSK no focal spinal tenderness, no joint edema Neuro: non-focal, well-oriented, appropriate affect Breasts: Deferred   Lab Results  Component Value Date   WBC 5.9 04/11/2022   HGB 11.3 (L) 04/11/2022   HCT 37.3 (L) 04/11/2022   MCV 86.1 04/11/2022   PLT 334 04/11/2022   Lab Results  Component Value Date   FERRITIN 70 04/11/2022   IRON 32 (L) 04/11/2022   TIBC 464 (H) 04/11/2022   UIBC 432 04/11/2022   IRONPCTSAT 7 (L) 04/11/2022   Lab Results  Component Value Date   RETICCTPCT 1.0 06/13/2022   RBC 4.32 06/13/2022   No results found for: "KPAFRELGTCHN", "LAMBDASER", "KAPLAMBRATIO" No results found for: "IGGSERUM", "IGA", "IGMSERUM" No results found for: "TOTALPROTELP", "ALBUMINELP", "A1GS", "A2GS", "BETS", "BETA2SER", "GAMS", "MSPIKE", "SPEI"   Chemistry      Component Value Date/Time   NA 141 04/11/2022 0952   NA 140 05/26/2017 0856   K 4.7 04/11/2022 0952   K 4.2 05/26/2017 0856   CL 103 04/11/2022 0952   CO2 28 04/11/2022 0952   CO2 25 05/26/2017 0856    BUN 15 04/11/2022 0952   BUN 12.7 05/26/2017 0856   CREATININE 1.30 (H) 04/11/2022 0952   CREATININE 1.2 05/26/2017 0856      Component Value Date/Time   CALCIUM 10.4 (H) 04/11/2022 0952   CALCIUM 9.5 05/26/2017 0856   ALKPHOS 84 04/11/2022 0952   ALKPHOS 83 05/26/2017 0856   AST 10 (L) 04/11/2022 0952   AST 16 05/26/2017 0856   ALT 9 04/11/2022 0952   ALT 22 05/26/2017 0856   BILITOT 0.3 04/11/2022 0952   BILITOT 0.28 05/26/2017 0856       Impression and Plan: Adam Quinn is a very pleasant 66 yo caucasian gentleman with 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  Iron studies are pending.  Lab check in 2 months, follow-up in 4 months.   Lottie Dawson, NP 7/21/202310:48 AM

## 2022-06-17 ENCOUNTER — Ambulatory Visit: Payer: PPO | Admitting: Gastroenterology

## 2022-06-17 ENCOUNTER — Other Ambulatory Visit: Payer: Self-pay

## 2022-06-17 ENCOUNTER — Encounter: Payer: Self-pay | Admitting: Gastroenterology

## 2022-06-17 VITALS — BP 126/70 | HR 101 | Ht 68.0 in | Wt 167.8 lb

## 2022-06-17 DIAGNOSIS — D500 Iron deficiency anemia secondary to blood loss (chronic): Secondary | ICD-10-CM

## 2022-06-17 DIAGNOSIS — K648 Other hemorrhoids: Secondary | ICD-10-CM | POA: Diagnosis not present

## 2022-06-17 DIAGNOSIS — Z860100 Personal history of colon polyps, unspecified: Secondary | ICD-10-CM

## 2022-06-17 NOTE — Patient Instructions (Addendum)
You will need follow up labs in October. Please go directly to our lab in the basement around the week of 09/17/22.  Dr. Dicie Beam office will schedule you for a iron infusion.   Please follow up with Dr. Silverio Decamp in 6 months.   The [facility] providers would like to encourage you to use [facility] to communicate with providers for non-urgent requests or questions.  Due to long hold times on the telephone, sending your provider a message by [facility] may be a faster and more efficient way to get a response.  Please allow 48 business hours for a response.  Please remember that this is for non-urgent requests.   Due to recent changes in healthcare laws, you may see the results of your imaging and laboratory studies on MyChart before your provider has had a chance to review them.  We understand that in some cases there may be results that are confusing or concerning to you. Not all laboratory results come back in the same time frame and the provider may be waiting for multiple results in order to interpret others.  Please give [place] 48 hours in order for your provider to thoroughly review all the results before contacting the office for clarification of your results.

## 2022-06-17 NOTE — Progress Notes (Signed)
         Adam Quinn    [id number]    11/04/1956  Primary Care Physician:Pahwani, Rinka R, MD  Referring Physician: Griffin, Elaine, MD [address] [place],  [place] [address]   Chief complaint: Iron deficiency anemia  HPI:  66-year-old very pleasant gentleman with history of type 2 diabetes, CVA on chronic antiplatelet therapy with Plavix, with chronic iron deficiency anemia  Denies any nausea, vomiting, abdominal pain, melena or bright red blood per rectum He continues to have chronic fatigue.  Last IV iron infusion was 3 months ago.  His iron saturation and ferritin have trended down since.  Small bowel video capsule Apr 04, 2022: Negative for any small bowel source of GI blood loss  Colonoscopy 01/30/22 - One 12 mm polyp in the ascending colon, removed with a cold snare. Resected and retrieved. - One 4 mm polyp in the rectum, removed with a cold snare. Resected and retrieved. - Severe diverticulosis in the sigmoid colon, in the descending colon, in the transverse colon, in the ascending colon and in the cecum. There was evidence of diverticular spasm. Peri-diverticular erythema was seen. There was evidence of an impacted diverticulum. - Non-bleeding external and internal hemorrhoids.   CT angio abdomen and pelvis during hospitalization showed evidence of sigmoid diverticulitis otherwise unremarkable exam EGD August 02, 2021 was negative for any acute upper GI blood loss source   Colonoscopy in June 2020 showed diverticulosis, was inadequate prep.  He was recommended to repeat colonoscopy in 6 months   Colonoscopy July 2016 with removal of 15 mm anything angulated cecal polyp and diverticulosis     Outpatient Encounter Medications as of 06/17/2022  Medication Sig   bimatoprost (LUMIGAN) 0.01 % SOLN Instill 1 drop in both eyes at bedtime   cefdinir (OMNICEF) 300 MG capsule Take 1 capsule (300 mg total) by mouth 2 (two) times daily. Start 5/3    clopidogrel (PLAVIX) 75 MG tablet Take 1 tablet (75 mg total) by mouth daily.   Continuous Blood Gluc Sensor (FREESTYLE LIBRE 2 SENSOR) MISC Apply sensor to the body as directed every 2 weeks to read blood sugar continuously   COVID-19 At Home Antigen Test (CARESTART COVID-19 HOME TEST) KIT Use as directed   dorzolamide (TRUSOPT) 2 % ophthalmic solution Instill 1 drop in both eyes twice a day   Dulaglutide (TRULICITY) 1.5 MG/0.5ML SOPN Inject under the skin once a week (Patient taking differently: Inject 1.5 mg into the skin every Sunday.)   ELDERBERRY PO Take 1 tablet by mouth at bedtime. Gummy   gabapentin (NEURONTIN) 300 MG capsule Take 1 capsule by mouth once daily   gabapentin (NEURONTIN) 300 MG capsule TAKE 1 CAPSULE BY MOUTH ONCE DAILY   insulin degludec (TRESIBA FLEXTOUCH) 200 UNIT/ML FlexTouch Pen Inject 50 units under the skin 1 - 2 times per week (Patient taking differently: Inject 30 Units into the skin daily as needed (if BG is reading 150 or more).)   Insulin Pen Needle 31G X 5 MM MISC Use as directed with Tresiba   lansoprazole (PREVACID) 30 MG capsule TAKE 1 CAPSULE BY MOUTH TWICE DAILY   loratadine-pseudoephedrine (ALAVERT D-12 HOUR ALLERGY/CONG) 5-120 MG tablet Take 1 tablet by mouth at bedtime.   Melatonin 5 MG CHEW Chew 5 mg by mouth at bedtime.   metFORMIN (GLUCOPHAGE-XR) 500 MG 24 hr tablet Take 2 tablets by mouth twice a day   Probiotic Product (RA PROBIOTIC GUMMIES PO) Take 1 capsule by   mouth at bedtime.   sertraline (ZOLOFT) 100 MG tablet Take 1 tablet by mouth once daily   simvastatin (ZOCOR) 20 MG tablet Take 1 tablet (20 mg total) by mouth daily. (Patient taking differently: Take 20 mg by mouth every evening.)   sodium bicarbonate 650 MG tablet Take 1 tablet by mouth 2 times daily   tamsulosin (FLOMAX) 0.4 MG CAPS capsule Take 1 capsule by mouth at bedtime   No facility-administered encounter medications on file as of 06/17/2022.    Allergies as of 06/17/2022 -  Review Complete 06/13/2022  Allergen Reaction Noted   Aspirin Anaphylaxis and Hives 03/16/2008   Shrimp [shellfish allergy] Hives 05/21/2015    Past Medical History:  Diagnosis Date   Allergy    seasonal   Anxiety    Cataract    cataract surgery bilaterally resolved issues   Central scotoma    Chronic kidney disease    small protien showing uses Lisinopril for this   Diverticulitis    Diverticulosis    DM (diabetes mellitus) (HCC)    GERD (gastroesophageal reflux disease)    Glaucoma    History of colon polyps    History of hiatal hernia    History of kidney stones    x1   Iron deficiency anemia    Iron infusion 04/29/2019   Neuropathy    OSA on CPAP    Stroke (HCC)    Wears glasses    READING    Past Surgical History:  Procedure Laterality Date   cataract surgery     bilateral   COLONOSCOPY     COLONOSCOPY N/A 06/04/2015   Procedure: COLONOSCOPY;  Surgeon: Robert D Kaplan, MD;  Location: [facility];  Service: Endoscopy;  Laterality: N/A;   COLONOSCOPY WITH PROPOFOL N/A 05/09/2019   Procedure: COLONOSCOPY WITH PROPOFOL;  Surgeon: Danis, Henry L III, MD;  Location: [facility];  Service: Gastroenterology;  Laterality: N/A;   COLONOSCOPY WITH PROPOFOL N/A 01/30/2022   Procedure: COLONOSCOPY WITH PROPOFOL;  Surgeon: Nandigam, Kavitha V, MD;  Location: [facility];  Service: Endoscopy;  Laterality: N/A;   ESOPHAGOGASTRODUODENOSCOPY N/A 06/04/2015   Procedure: ESOPHAGOGASTRODUODENOSCOPY (EGD);  Surgeon: Robert D Kaplan, MD;  Location: [facility];  Service: Endoscopy;  Laterality: N/A;   ESOPHAGOGASTRODUODENOSCOPY (EGD) WITH PROPOFOL N/A 08/02/2021   Procedure: ESOPHAGOGASTRODUODENOSCOPY (EGD) WITH PROPOFOL;  Surgeon: Nandigam, Kavitha V, MD;  Location: [facility];  Service: Endoscopy;  Laterality: N/A;   POLYPECTOMY  01/30/2022   Procedure: POLYPECTOMY;  Surgeon: Nandigam, Kavitha V, MD;  Location: [facility];  Service: Endoscopy;;   UPPER GASTROINTESTINAL ENDOSCOPY      WISDOM TOOTH EXTRACTION     with sedation    Family History  Problem Relation Age of Onset   Colon polyps Father    Lung cancer Father    Diabetes Mother    Dementia Mother    Diabetes Sister        x 2   Stroke Maternal Grandmother    Stroke Paternal Grandfather    Colon cancer Neg Hx    Rectal cancer Neg Hx    Stomach cancer Neg Hx    Pancreatic cancer Neg Hx     Social History   Socioeconomic History   Marital status: Married    Spouse name: Not on file   Number of children: 3   Years of education: Not on file   Highest education level: Not on file  Occupational History   Occupation: consultant  Tobacco Use   Smoking status: Former      Types: Cigarettes    Quit date: 10/04/1997    Years since quitting: 24.7   Smokeless tobacco: Never  Vaping Use   Vaping Use: Never used  Substance and Sexual Activity   Alcohol use: No   Drug use: No   Sexual activity: Not on file  Other Topics Concern   Not on file  Social History Narrative   Not on file   Social Determinants of Health   Financial Resource Strain: Not on file  Food Insecurity: No Food Insecurity (08/08/2020)   Hunger Vital Sign    Worried About Running Out of Food in the Last Year: Never true    Ran Out of Food in the Last Year: Never true  Transportation Needs: No Transportation Needs (08/08/2020)   PRAPARE - Hydrologist (Medical): No    Lack of Transportation (Non-Medical): No  Physical Activity: Not on file  Stress: Not on file  Social Connections: Not on file  Intimate Partner Violence: Not on file      Review of systems: All other review of systems negative except as mentioned in the HPI.   Physical Exam: Vitals:   06/17/22 1052  BP: 126/70  Pulse: (!) 101  SpO2: 96%   Body mass index is 25.51 kg/m. Gen:      No acute distress HEENT:  sclera anicteric Abd:      soft, non-tender; no palpable masses, no distension Ext:    No edema Neuro: alert and  oriented x 3 Psych: normal mood and affect  Data Reviewed:  Reviewed labs, radiology imaging, old records and pertinent past GI work up   Assessment and Plan/Recommendations:  66 year old very pleasant gentleman with history of type 2 diabetes, OSA on CPAP, history of CVA on chronic antiplatelet therapy with Plavix with chronic iron deficiency anemia No overt GI bleed  He has overall low iron stores, will request IV iron infusion through hematology.  He does not tolerate oral iron Advised patient to continue to monitor for any signs of GI bleed  Follow-up routine CBC and iron panel every 2 to 3 months  Return in 6 months    This visit required 30 minutes of patient care (this includes precharting, chart review, review of results, face-to-face time used for counseling as well as treatment plan and follow-up. The patient was provided an opportunity to ask questions and all were answered. The patient agreed with the plan and demonstrated an understanding of the instructions.  Damaris Hippo , MD    CC: Kelton Pillar, MD

## 2022-06-18 DIAGNOSIS — E1121 Type 2 diabetes mellitus with diabetic nephropathy: Secondary | ICD-10-CM | POA: Diagnosis not present

## 2022-06-23 ENCOUNTER — Encounter: Payer: Self-pay | Admitting: Gastroenterology

## 2022-06-23 ENCOUNTER — Inpatient Hospital Stay: Payer: HMO

## 2022-06-23 VITALS — BP 105/55 | HR 102 | Temp 97.9°F | Resp 20

## 2022-06-23 DIAGNOSIS — D500 Iron deficiency anemia secondary to blood loss (chronic): Secondary | ICD-10-CM | POA: Diagnosis not present

## 2022-06-23 MED ORDER — SODIUM CHLORIDE 0.9 % IV SOLN
510.0000 mg | Freq: Once | INTRAVENOUS | Status: AC
  Administered 2022-06-23: 510 mg via INTRAVENOUS
  Filled 2022-06-23: qty 17

## 2022-06-23 MED ORDER — SODIUM CHLORIDE 0.9 % IV SOLN: Freq: Once | INTRAVENOUS | Status: AC

## 2022-06-23 NOTE — Patient Instructions (Signed)

## 2022-06-24 ENCOUNTER — Other Ambulatory Visit (HOSPITAL_BASED_OUTPATIENT_CLINIC_OR_DEPARTMENT_OTHER): Payer: Self-pay

## 2022-06-24 MED ORDER — FREESTYLE LIBRE 2 SENSOR MISC
1 refills | Status: DC
  Filled 2022-06-24: qty 2, 28d supply, fill #0
  Filled 2022-07-23: qty 2, 28d supply, fill #1
  Filled 2022-08-13: qty 2, 28d supply, fill #2
  Filled 2022-09-05: qty 2, 28d supply, fill #3
  Filled 2022-10-02: qty 2, 28d supply, fill #4
  Filled 2022-11-01: qty 2, 28d supply, fill #5

## 2022-06-26 ENCOUNTER — Encounter: Payer: Self-pay | Admitting: Family

## 2022-06-30 ENCOUNTER — Inpatient Hospital Stay: Payer: HMO | Attending: Hematology & Oncology

## 2022-06-30 VITALS — BP 103/62 | HR 109 | Temp 98.1°F | Resp 18

## 2022-06-30 DIAGNOSIS — K922 Gastrointestinal hemorrhage, unspecified: Secondary | ICD-10-CM | POA: Diagnosis not present

## 2022-06-30 DIAGNOSIS — K909 Intestinal malabsorption, unspecified: Secondary | ICD-10-CM | POA: Insufficient documentation

## 2022-06-30 DIAGNOSIS — D500 Iron deficiency anemia secondary to blood loss (chronic): Secondary | ICD-10-CM | POA: Diagnosis not present

## 2022-06-30 MED ORDER — SODIUM CHLORIDE 0.9 % IV SOLN
510.0000 mg | Freq: Once | INTRAVENOUS | Status: AC
  Administered 2022-06-30: 510 mg via INTRAVENOUS
  Filled 2022-06-30: qty 17

## 2022-06-30 MED ORDER — SODIUM CHLORIDE 0.9 % IV SOLN: INTRAVENOUS | Status: DC

## 2022-06-30 NOTE — Patient Instructions (Signed)

## 2022-07-09 ENCOUNTER — Other Ambulatory Visit (HOSPITAL_BASED_OUTPATIENT_CLINIC_OR_DEPARTMENT_OTHER): Payer: Self-pay

## 2022-07-21 ENCOUNTER — Other Ambulatory Visit (HOSPITAL_BASED_OUTPATIENT_CLINIC_OR_DEPARTMENT_OTHER): Payer: Self-pay

## 2022-07-23 ENCOUNTER — Other Ambulatory Visit (HOSPITAL_BASED_OUTPATIENT_CLINIC_OR_DEPARTMENT_OTHER): Payer: Self-pay

## 2022-08-07 ENCOUNTER — Encounter: Payer: Self-pay | Admitting: Family

## 2022-08-07 ENCOUNTER — Other Ambulatory Visit (HOSPITAL_BASED_OUTPATIENT_CLINIC_OR_DEPARTMENT_OTHER): Payer: Self-pay

## 2022-08-07 DIAGNOSIS — R338 Other retention of urine: Secondary | ICD-10-CM | POA: Diagnosis not present

## 2022-08-07 MED ORDER — TAMSULOSIN HCL 0.4 MG PO CAPS
0.4000 mg | ORAL_CAPSULE | Freq: Every day | ORAL | 3 refills | Status: DC
  Filled 2022-08-07 – 2022-08-20 (×3): qty 90, 90d supply, fill #0
  Filled 2022-11-01: qty 90, 90d supply, fill #1
  Filled 2023-02-12: qty 90, 90d supply, fill #2
  Filled 2023-05-06: qty 90, 90d supply, fill #3

## 2022-08-13 ENCOUNTER — Other Ambulatory Visit (HOSPITAL_BASED_OUTPATIENT_CLINIC_OR_DEPARTMENT_OTHER): Payer: Self-pay

## 2022-08-13 MED ORDER — TRESIBA FLEXTOUCH 200 UNIT/ML ~~LOC~~ SOPN
50.0000 [IU] | PEN_INJECTOR | SUBCUTANEOUS | 1 refills | Status: DC
  Filled 2022-08-13: qty 3, 56d supply, fill #0
  Filled 2022-09-16 – 2022-10-23 (×2): qty 3, 56d supply, fill #1

## 2022-08-19 ENCOUNTER — Other Ambulatory Visit (HOSPITAL_BASED_OUTPATIENT_CLINIC_OR_DEPARTMENT_OTHER): Payer: Self-pay

## 2022-08-20 ENCOUNTER — Other Ambulatory Visit (HOSPITAL_BASED_OUTPATIENT_CLINIC_OR_DEPARTMENT_OTHER): Payer: Self-pay

## 2022-08-20 MED ORDER — SIMVASTATIN 20 MG PO TABS
ORAL_TABLET | ORAL | 1 refills | Status: DC
  Filled 2022-08-20: qty 90, 90d supply, fill #0
  Filled 2022-11-01: qty 90, 90d supply, fill #1

## 2022-08-21 ENCOUNTER — Other Ambulatory Visit (HOSPITAL_BASED_OUTPATIENT_CLINIC_OR_DEPARTMENT_OTHER): Payer: Self-pay

## 2022-08-21 MED ORDER — METFORMIN HCL ER 500 MG PO TB24
1000.0000 mg | ORAL_TABLET | Freq: Two times a day (BID) | ORAL | 0 refills | Status: DC
  Filled 2022-08-21: qty 360, 90d supply, fill #0

## 2022-08-31 ENCOUNTER — Other Ambulatory Visit (HOSPITAL_BASED_OUTPATIENT_CLINIC_OR_DEPARTMENT_OTHER): Payer: Self-pay

## 2022-08-31 ENCOUNTER — Other Ambulatory Visit: Payer: Self-pay | Admitting: Adult Health

## 2022-08-31 DIAGNOSIS — I6381 Other cerebral infarction due to occlusion or stenosis of small artery: Secondary | ICD-10-CM

## 2022-09-01 ENCOUNTER — Other Ambulatory Visit (HOSPITAL_BASED_OUTPATIENT_CLINIC_OR_DEPARTMENT_OTHER): Payer: Self-pay

## 2022-09-01 MED ORDER — CLOPIDOGREL BISULFATE 75 MG PO TABS
75.0000 mg | ORAL_TABLET | Freq: Every day | ORAL | 1 refills | Status: DC
  Filled 2022-09-01: qty 90, 90d supply, fill #0

## 2022-09-04 ENCOUNTER — Other Ambulatory Visit (HOSPITAL_BASED_OUTPATIENT_CLINIC_OR_DEPARTMENT_OTHER): Payer: Self-pay

## 2022-09-04 MED ORDER — LANSOPRAZOLE 30 MG PO CPDR
30.0000 mg | DELAYED_RELEASE_CAPSULE | Freq: Two times a day (BID) | ORAL | 2 refills | Status: DC
  Filled 2022-09-04: qty 180, 90d supply, fill #0
  Filled 2022-12-11: qty 180, 90d supply, fill #1
  Filled 2023-03-10: qty 180, 90d supply, fill #2

## 2022-09-05 ENCOUNTER — Encounter: Payer: Self-pay | Admitting: Family

## 2022-09-05 ENCOUNTER — Other Ambulatory Visit (HOSPITAL_BASED_OUTPATIENT_CLINIC_OR_DEPARTMENT_OTHER): Payer: Self-pay

## 2022-09-05 MED ORDER — DORZOLAMIDE HCL 2 % OP SOLN
OPHTHALMIC | 3 refills | Status: DC
  Filled 2022-09-05: qty 10, 50d supply, fill #0
  Filled 2022-12-26: qty 10, 50d supply, fill #1
  Filled 2023-02-12: qty 10, 50d supply, fill #2
  Filled 2023-05-29: qty 10, 50d supply, fill #3
  Filled 2023-08-13: qty 10, 50d supply, fill #4
  Filled ????-??-??: fill #4

## 2022-09-08 ENCOUNTER — Encounter: Payer: Self-pay | Admitting: Family

## 2022-09-09 ENCOUNTER — Inpatient Hospital Stay: Payer: HMO | Attending: Hematology & Oncology

## 2022-09-09 DIAGNOSIS — D500 Iron deficiency anemia secondary to blood loss (chronic): Secondary | ICD-10-CM | POA: Diagnosis present

## 2022-09-09 DIAGNOSIS — K922 Gastrointestinal hemorrhage, unspecified: Secondary | ICD-10-CM | POA: Diagnosis present

## 2022-09-09 DIAGNOSIS — K909 Intestinal malabsorption, unspecified: Secondary | ICD-10-CM | POA: Diagnosis not present

## 2022-09-09 LAB — IRON AND IRON BINDING CAPACITY (CC-WL,HP ONLY)
Iron: 58 ug/dL (ref 45–182)
Saturation Ratios: 14 % — ABNORMAL LOW (ref 17.9–39.5)
TIBC: 414 ug/dL (ref 250–450)
UIBC: 356 ug/dL (ref 117–376)

## 2022-09-09 LAB — CBC WITH DIFFERENTIAL (CANCER CENTER ONLY)
Abs Immature Granulocytes: 0.09 10*3/uL — ABNORMAL HIGH (ref 0.00–0.07)
Basophils Absolute: 0.1 10*3/uL (ref 0.0–0.1)
Basophils Relative: 1 %
Eosinophils Absolute: 0.1 10*3/uL (ref 0.0–0.5)
Eosinophils Relative: 2 %
HCT: 38.6 % — ABNORMAL LOW (ref 39.0–52.0)
Hemoglobin: 12.5 g/dL — ABNORMAL LOW (ref 13.0–17.0)
Immature Granulocytes: 1 %
Lymphocytes Relative: 21 %
Lymphs Abs: 1.3 10*3/uL (ref 0.7–4.0)
MCH: 30.4 pg (ref 26.0–34.0)
MCHC: 32.4 g/dL (ref 30.0–36.0)
MCV: 93.9 fL (ref 80.0–100.0)
Monocytes Absolute: 0.5 10*3/uL (ref 0.1–1.0)
Monocytes Relative: 8 %
Neutro Abs: 4.3 10*3/uL (ref 1.7–7.7)
Neutrophils Relative %: 67 %
Platelet Count: 273 10*3/uL (ref 150–400)
RBC: 4.11 MIL/uL — ABNORMAL LOW (ref 4.22–5.81)
RDW: 17.1 % — ABNORMAL HIGH (ref 11.5–15.5)
WBC Count: 6.4 10*3/uL (ref 4.0–10.5)
nRBC: 0 % (ref 0.0–0.2)

## 2022-09-09 LAB — RETICULOCYTES
Immature Retic Fract: 18.5 % — ABNORMAL HIGH (ref 2.3–15.9)
RBC.: 4.02 MIL/uL — ABNORMAL LOW (ref 4.22–5.81)
Retic Count, Absolute: 89.2 10*3/uL (ref 19.0–186.0)
Retic Ct Pct: 2.2 % (ref 0.4–3.1)

## 2022-09-09 LAB — FERRITIN: Ferritin: 34 ng/mL (ref 24–336)

## 2022-09-10 ENCOUNTER — Other Ambulatory Visit: Payer: Self-pay | Admitting: Family

## 2022-09-10 ENCOUNTER — Encounter: Payer: Self-pay | Admitting: Podiatry

## 2022-09-10 ENCOUNTER — Ambulatory Visit: Payer: HMO | Admitting: Podiatry

## 2022-09-10 DIAGNOSIS — B351 Tinea unguium: Secondary | ICD-10-CM

## 2022-09-10 DIAGNOSIS — N1831 Chronic kidney disease, stage 3a: Secondary | ICD-10-CM | POA: Diagnosis not present

## 2022-09-10 DIAGNOSIS — M79674 Pain in right toe(s): Secondary | ICD-10-CM

## 2022-09-10 DIAGNOSIS — D689 Coagulation defect, unspecified: Secondary | ICD-10-CM | POA: Diagnosis not present

## 2022-09-10 DIAGNOSIS — E1142 Type 2 diabetes mellitus with diabetic polyneuropathy: Secondary | ICD-10-CM

## 2022-09-10 DIAGNOSIS — M79675 Pain in left toe(s): Secondary | ICD-10-CM

## 2022-09-10 NOTE — Progress Notes (Signed)
This patient returns to my office for at risk foot care.  This patient requires this care by a professional since this patient will be at risk due to having diabetic neuropathy anc coagulation defect.  This patient is unable to cut nails himself since the patient cannot reach his nails.These nails are painful walking and wearing shoes.  This patient presents for at risk foot care today.  General Appearance  Alert, conversant and in no acute stress.  Vascular  Dorsalis pedis and posterior tibial  pulses are palpable  bilaterally.  Capillary return is within normal limits  bilaterally. Temperature is within normal limits  bilaterally.  Neurologic  Senn-Weinstein monofilament wire test absent  bilaterally. Muscle power within normal limits bilaterally.  Nails Thick disfigured discolored nails with subungual debris  Hallux nails  B/L. No evidence of bacterial infection or drainage bilaterally.  Orthopedic  No limitations of motion  feet .  No crepitus or effusions noted.  Hammer toes 2-4  B/L. Hallux malleus  B/L.  Cavus foot  B/L.  Skin  normotropic skin with no porokeratosis noted bilaterally.  No signs of infections or ulcers noted.     Onychomycosis  Pain in right toes  Pain in left toes  Consent was obtained for treatment procedures.   Mechanical debridement of hallux nails  bilaterally performed with a nail nipper.  Filed with dremel without incident.    Return office visit   4   months                  Told patient to return for periodic foot care and evaluation due to potential at risk complications.   Chirstine Defrain DPM  

## 2022-09-16 ENCOUNTER — Other Ambulatory Visit (HOSPITAL_BASED_OUTPATIENT_CLINIC_OR_DEPARTMENT_OTHER): Payer: Self-pay

## 2022-09-17 ENCOUNTER — Other Ambulatory Visit (HOSPITAL_BASED_OUTPATIENT_CLINIC_OR_DEPARTMENT_OTHER): Payer: Self-pay

## 2022-09-17 ENCOUNTER — Inpatient Hospital Stay: Payer: HMO

## 2022-09-17 VITALS — BP 133/74 | HR 87 | Temp 97.7°F | Resp 16

## 2022-09-17 DIAGNOSIS — D500 Iron deficiency anemia secondary to blood loss (chronic): Secondary | ICD-10-CM | POA: Diagnosis not present

## 2022-09-17 MED ORDER — SODIUM CHLORIDE 0.9 % IV SOLN: INTRAVENOUS | Status: DC

## 2022-09-17 MED ORDER — SODIUM CHLORIDE 0.9 % IV SOLN
510.0000 mg | Freq: Once | INTRAVENOUS | Status: AC
  Administered 2022-09-17: 510 mg via INTRAVENOUS
  Filled 2022-09-17: qty 17

## 2022-09-17 NOTE — Patient Instructions (Signed)

## 2022-09-18 ENCOUNTER — Other Ambulatory Visit (HOSPITAL_BASED_OUTPATIENT_CLINIC_OR_DEPARTMENT_OTHER): Payer: Self-pay

## 2022-09-18 MED ORDER — TRESIBA FLEXTOUCH 200 UNIT/ML ~~LOC~~ SOPN
50.0000 [IU] | PEN_INJECTOR | SUBCUTANEOUS | 1 refills | Status: DC
  Filled 2022-09-18: qty 3, 56d supply, fill #0

## 2022-09-19 ENCOUNTER — Other Ambulatory Visit (HOSPITAL_BASED_OUTPATIENT_CLINIC_OR_DEPARTMENT_OTHER): Payer: Self-pay

## 2022-09-22 ENCOUNTER — Other Ambulatory Visit (HOSPITAL_BASED_OUTPATIENT_CLINIC_OR_DEPARTMENT_OTHER): Payer: Self-pay

## 2022-09-22 MED ORDER — TRESIBA FLEXTOUCH 200 UNIT/ML ~~LOC~~ SOPN
30.0000 [IU] | PEN_INJECTOR | Freq: Every day | SUBCUTANEOUS | 1 refills | Status: DC
  Filled 2022-09-22 (×2): qty 9, 60d supply, fill #0
  Filled 2022-09-24: qty 9, 30d supply, fill #0
  Filled 2022-09-24: qty 9, 60d supply, fill #0
  Filled 2022-11-24: qty 9, 60d supply, fill #1

## 2022-09-23 ENCOUNTER — Other Ambulatory Visit (HOSPITAL_BASED_OUTPATIENT_CLINIC_OR_DEPARTMENT_OTHER): Payer: Self-pay

## 2022-09-24 ENCOUNTER — Inpatient Hospital Stay: Payer: HMO | Attending: Hematology & Oncology

## 2022-09-24 ENCOUNTER — Other Ambulatory Visit (HOSPITAL_BASED_OUTPATIENT_CLINIC_OR_DEPARTMENT_OTHER): Payer: Self-pay

## 2022-09-24 VITALS — BP 107/56 | HR 90 | Temp 97.8°F | Resp 19

## 2022-09-24 DIAGNOSIS — D500 Iron deficiency anemia secondary to blood loss (chronic): Secondary | ICD-10-CM | POA: Diagnosis not present

## 2022-09-24 DIAGNOSIS — K922 Gastrointestinal hemorrhage, unspecified: Secondary | ICD-10-CM | POA: Insufficient documentation

## 2022-09-24 DIAGNOSIS — K909 Intestinal malabsorption, unspecified: Secondary | ICD-10-CM | POA: Diagnosis not present

## 2022-09-24 MED ORDER — SODIUM CHLORIDE 0.9 % IV SOLN
510.0000 mg | Freq: Once | INTRAVENOUS | Status: AC
  Administered 2022-09-24: 510 mg via INTRAVENOUS
  Filled 2022-09-24: qty 17

## 2022-09-24 MED ORDER — SODIUM CHLORIDE 0.9 % IV SOLN: Freq: Once | INTRAVENOUS | Status: AC

## 2022-09-26 ENCOUNTER — Other Ambulatory Visit (HOSPITAL_BASED_OUTPATIENT_CLINIC_OR_DEPARTMENT_OTHER): Payer: Self-pay

## 2022-10-02 ENCOUNTER — Other Ambulatory Visit (HOSPITAL_BASED_OUTPATIENT_CLINIC_OR_DEPARTMENT_OTHER): Payer: Self-pay

## 2022-10-02 MED ORDER — TRULICITY 1.5 MG/0.5ML ~~LOC~~ SOAJ
0.5000 mg | SUBCUTANEOUS | 0 refills | Status: DC
  Filled 2022-10-02: qty 6, 84d supply, fill #0

## 2022-10-06 ENCOUNTER — Encounter (INDEPENDENT_AMBULATORY_CARE_PROVIDER_SITE_OTHER): Payer: HMO | Admitting: Ophthalmology

## 2022-10-08 ENCOUNTER — Other Ambulatory Visit (HOSPITAL_BASED_OUTPATIENT_CLINIC_OR_DEPARTMENT_OTHER): Payer: Self-pay

## 2022-10-15 ENCOUNTER — Other Ambulatory Visit (HOSPITAL_BASED_OUTPATIENT_CLINIC_OR_DEPARTMENT_OTHER): Payer: Self-pay

## 2022-10-17 ENCOUNTER — Other Ambulatory Visit (HOSPITAL_BASED_OUTPATIENT_CLINIC_OR_DEPARTMENT_OTHER): Payer: Self-pay

## 2022-10-20 ENCOUNTER — Other Ambulatory Visit: Payer: HMO

## 2022-10-20 ENCOUNTER — Ambulatory Visit: Payer: HMO | Admitting: Family

## 2022-10-21 ENCOUNTER — Inpatient Hospital Stay: Payer: HMO

## 2022-10-21 ENCOUNTER — Inpatient Hospital Stay (HOSPITAL_BASED_OUTPATIENT_CLINIC_OR_DEPARTMENT_OTHER): Payer: HMO | Admitting: Family

## 2022-10-21 ENCOUNTER — Encounter: Payer: Self-pay | Admitting: Family

## 2022-10-21 VITALS — BP 117/64 | HR 96 | Temp 98.1°F | Resp 18 | Ht 68.0 in | Wt 177.6 lb

## 2022-10-21 DIAGNOSIS — D500 Iron deficiency anemia secondary to blood loss (chronic): Secondary | ICD-10-CM | POA: Diagnosis not present

## 2022-10-21 LAB — CBC WITH DIFFERENTIAL (CANCER CENTER ONLY)
Abs Immature Granulocytes: 0.09 10*3/uL — ABNORMAL HIGH (ref 0.00–0.07)
Basophils Absolute: 0 10*3/uL (ref 0.0–0.1)
Basophils Relative: 1 %
Eosinophils Absolute: 0.1 10*3/uL (ref 0.0–0.5)
Eosinophils Relative: 1 %
HCT: 41.7 % (ref 39.0–52.0)
Hemoglobin: 13.9 g/dL (ref 13.0–17.0)
Immature Granulocytes: 2 %
Lymphocytes Relative: 20 %
Lymphs Abs: 1.2 10*3/uL (ref 0.7–4.0)
MCH: 31.8 pg (ref 26.0–34.0)
MCHC: 33.3 g/dL (ref 30.0–36.0)
MCV: 95.4 fL (ref 80.0–100.0)
Monocytes Absolute: 0.5 10*3/uL (ref 0.1–1.0)
Monocytes Relative: 9 %
Neutro Abs: 3.9 10*3/uL (ref 1.7–7.7)
Neutrophils Relative %: 67 %
Platelet Count: 197 10*3/uL (ref 150–400)
RBC: 4.37 MIL/uL (ref 4.22–5.81)
RDW: 14.3 % (ref 11.5–15.5)
WBC Count: 5.7 10*3/uL (ref 4.0–10.5)
nRBC: 0 % (ref 0.0–0.2)

## 2022-10-21 LAB — RETICULOCYTES
Immature Retic Fract: 7.9 % (ref 2.3–15.9)
RBC.: 4.36 MIL/uL (ref 4.22–5.81)
Retic Count, Absolute: 66.7 10*3/uL (ref 19.0–186.0)
Retic Ct Pct: 1.5 % (ref 0.4–3.1)

## 2022-10-21 LAB — FERRITIN: Ferritin: 248 ng/mL (ref 24–336)

## 2022-10-21 NOTE — Addendum Note (Signed)
Addended by: Lucile Crater on: 10/21/2022 03:51 PM   Modules accepted: Orders

## 2022-10-21 NOTE — Progress Notes (Signed)
Hematology and Oncology Follow Up Visit  Adam Quinn [id number] 11/12/1956 66 y.o. 10/21/2022   Principle Diagnosis:  Iron deficiency anemia Erythropoietin deficiency anemia   Current Therapy:        IV iron as indicated    Interim History:  Adam Quinn is here today for follow-up. He is doing well but does have some fatigue at times.   He states that the last time he had blood in his stool was over the summer. This was associated with hemorrhoid and bout with constipation. No other blood loss noted since.  No abnormal bruising, no petechiae.  Occasional episodes of dizziness with vertigo.  No fever, chills, n/v, cough, rash, SOB, chest pain, palpitations, abdominal pain or changes in bowel or bladder habits.  No swelling, tenderness in his extremities.  Neuropathy unchanged from baseline.  No falls or syncope reported.  Appetite and hydration are good. Weight is stable at 177 lbs.   ECOG Performance Status: 1 - Symptomatic but completely ambulatory  Medications:  Allergies as of 10/21/2022       Reactions   Aspirin Anaphylaxis, Hives   Shrimp [shellfish Allergy] Hives   Just shrimp        Medication List        Accurate as of October 21, 2022  3:24 PM. If you have any questions, ask your nurse or doctor.          Alavert D-12 Hour Allergy/Cong 5-120 MG tablet Generic drug: loratadine-pseudoephedrine Take 1 tablet by mouth at bedtime.   Carestart COVID-19 Home Test Kit Generic drug: COVID-19 At Home Antigen Test Use as directed   cefdinir 300 MG capsule Commonly known as: OMNICEF Take 1 capsule (300 mg total) by mouth 2 (two) times daily. Start 5/3   clopidogrel 75 MG tablet Commonly known as: PLAVIX Take 1 tablet (75 mg total) by mouth daily.   dorzolamide 2 % ophthalmic solution Commonly known as: TRUSOPT Instill 1 drop in both eyes twice a day   dorzolamide 2 % ophthalmic solution Commonly known as: TRUSOPT Place 1 drop in both eyes twice  a day   ELDERBERRY PO Take 1 tablet by mouth at bedtime. Gummy   FreeStyle Libre 2 Sensor Misc Apply sensor as directed externally to body every 2 weeks to read blood sugar continuously 90 days   gabapentin 300 MG capsule Commonly known as: NEURONTIN Take 1 capsule by mouth once daily   gabapentin 300 MG capsule Commonly known as: NEURONTIN TAKE 1 CAPSULE BY MOUTH ONCE DAILY   lansoprazole 30 MG capsule Commonly known as: PREVACID Take 1 capsule (30 mg total) by mouth 2 (two) times daily.   Lumigan 0.01 % Soln Generic drug: bimatoprost Instill 1 drop in both eyes at bedtime   Melatonin 5 MG Chew Chew 5 mg by mouth at bedtime.   metFORMIN 500 MG 24 hr tablet Commonly known as: GLUCOPHAGE-XR Take 2 tablets (1,000 mg total) by mouth 2 (two) times daily.   Pentips 31G X 5 MM Misc Generic drug: Insulin Pen Needle Use as directed with Tyler Aas   RA PROBIOTIC GUMMIES PO Take 1 capsule by mouth at bedtime.   sertraline 100 MG tablet Commonly known as: ZOLOFT Take 1 tablet by mouth once daily   simvastatin 20 MG tablet Commonly known as: ZOCOR Take 1 tablet by mouth once a day   sodium bicarbonate 650 MG tablet Take 1 tablet by mouth 2 times daily   tamsulosin 0.4 MG Caps capsule Commonly known as:  FLOMAX Take 1 capsule by mouth at bedtime   tamsulosin 0.4 MG Caps capsule Commonly known as: FLOMAX Take 1 capsule (0.4 mg total) by mouth at bedtime.   Tyler Aas FlexTouch 200 UNIT/ML FlexTouch Pen Generic drug: insulin degludec Inject 50 units under the skin 1 - 2 times per week What changed:  how much to take when to take this reasons to take this   [place] FlexTouch 200 UNIT/ML FlexTouch Pen Generic drug: insulin degludec Inject 50 Units into the skin 1 (one) to 2 (two) times a week. What changed: Another medication with the same name was changed. Make sure you understand how and when to take each.   Tyler Aas FlexTouch 200 UNIT/ML FlexTouch Pen Generic drug:  insulin degludec Inject 50 Units into the skin 1 (one) to 2 (two) times a week. What changed: Another medication with the same name was changed. Make sure you understand how and when to take each.   Tyler Aas FlexTouch 200 UNIT/ML FlexTouch Pen Generic drug: insulin degludec Inject 30 Units into the skin daily. What changed: Another medication with the same name was changed. Make sure you understand how and when to take each.   Trulicity 1.5 VV/6.1YW Sopn Generic drug: Dulaglutide Inject under the skin once a week What changed:  how much to take how to take this when to take this   Trulicity 1.5 VP/7.1GG Sopn Generic drug: Dulaglutide Inject 0.5 mg as directed every 7 (seven) days. What changed: Another medication with the same name was changed. Make sure you understand how and when to take each.        Allergies:  Allergies  Allergen Reactions   Aspirin Anaphylaxis and Hives   Shrimp [Shellfish Allergy] Hives    Just shrimp     Past Medical History, Surgical history, Social history, and Family History were reviewed and updated.  Review of Systems: All other 10 point review of systems is negative.   Physical Exam:  vitals were not taken for this visit.   Wt Readings from Last 3 Encounters:  06/17/22 167 lb 12.8 oz (76.1 kg)  06/13/22 167 lb 1.3 oz (75.8 kg)  04/14/22 166 lb 1.3 oz (75.3 kg)    Ocular: Sclerae unicteric, pupils equal, round and reactive to light Ear-nose-throat: Oropharynx clear, dentition fair Lymphatic: No cervical or supraclavicular adenopathy Lungs no rales or rhonchi, good excursion bilaterally Heart regular rate and rhythm, no murmur appreciated Abd soft, nontender, positive bowel sounds MSK no focal spinal tenderness, no joint edema Neuro: non-focal, well-oriented, appropriate affect Breasts: Deferred   Lab Results  Component Value Date   WBC 5.7 10/21/2022   HGB 13.9 10/21/2022   HCT 41.7 10/21/2022   MCV 95.4 10/21/2022   PLT 197  10/21/2022   Lab Results  Component Value Date   FERRITIN 34 09/09/2022   IRON 58 09/09/2022   TIBC 414 09/09/2022   UIBC 356 09/09/2022   IRONPCTSAT 14 (L) 09/09/2022   Lab Results  Component Value Date   RETICCTPCT 1.5 10/21/2022   RBC 4.37 10/21/2022   RBC 4.36 10/21/2022   No results found for: "KPAFRELGTCHN", "LAMBDASER", "KAPLAMBRATIO" No results found for: "IGGSERUM", "IGA", "IGMSERUM" No results found for: "TOTALPROTELP", "ALBUMINELP", "A1GS", "A2GS", "BETS", "BETA2SER", "GAMS", "MSPIKE", "SPEI"   Chemistry      Component Value Date/Time   NA 143 06/13/2022 1027   NA 140 05/26/2017 0856   K 4.7 06/13/2022 1027   K 4.2 05/26/2017 0856   CL 107 06/13/2022 1027   CO2 28  06/13/2022 1027   CO2 25 05/26/2017 0856   BUN 14 06/13/2022 1027   BUN 12.7 05/26/2017 0856   CREATININE 1.32 (H) 06/13/2022 1027   CREATININE 1.2 05/26/2017 0856      Component Value Date/Time   CALCIUM 9.9 06/13/2022 1027   CALCIUM 9.5 05/26/2017 0856   ALKPHOS 86 06/13/2022 1027   ALKPHOS 83 05/26/2017 0856   AST 13 (L) 06/13/2022 1027   AST 16 05/26/2017 0856   ALT 15 06/13/2022 1027   ALT 22 05/26/2017 0856   BILITOT 0.2 (L) 06/13/2022 1027   BILITOT 0.28 05/26/2017 0856       Impression and Plan: Adam Quinn is a very pleasant 66 yo caucasian gentleman with 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  Iron studies are pending.  Lab check in 2 months, follow-up in 4 months.   Lottie Dawson, NP 11/28/20233:24 PM

## 2022-10-22 LAB — IRON AND IRON BINDING CAPACITY (CC-WL,HP ONLY)
Iron: 73 ug/dL (ref 45–182)
Saturation Ratios: 22 % (ref 17.9–39.5)
TIBC: 335 ug/dL (ref 250–450)
UIBC: 262 ug/dL (ref 117–376)

## 2022-10-23 ENCOUNTER — Other Ambulatory Visit (HOSPITAL_BASED_OUTPATIENT_CLINIC_OR_DEPARTMENT_OTHER): Payer: Self-pay

## 2022-10-23 MED ORDER — DORZOLAMIDE HCL 2 % OP SOLN
1.0000 [drp] | Freq: Two times a day (BID) | OPHTHALMIC | 3 refills | Status: DC
  Filled 2022-10-23: qty 30, 50d supply, fill #0

## 2022-10-24 ENCOUNTER — Other Ambulatory Visit (HOSPITAL_BASED_OUTPATIENT_CLINIC_OR_DEPARTMENT_OTHER): Payer: Self-pay

## 2022-10-28 ENCOUNTER — Other Ambulatory Visit (HOSPITAL_BASED_OUTPATIENT_CLINIC_OR_DEPARTMENT_OTHER): Payer: Self-pay

## 2022-10-28 MED ORDER — SERTRALINE HCL 100 MG PO TABS
100.0000 mg | ORAL_TABLET | Freq: Every day | ORAL | 0 refills | Status: DC
  Filled 2022-10-28: qty 90, 90d supply, fill #0
  Filled 2022-10-28: qty 3, 3d supply, fill #0

## 2022-11-01 ENCOUNTER — Other Ambulatory Visit (HOSPITAL_BASED_OUTPATIENT_CLINIC_OR_DEPARTMENT_OTHER): Payer: Self-pay

## 2022-11-03 ENCOUNTER — Other Ambulatory Visit (HOSPITAL_BASED_OUTPATIENT_CLINIC_OR_DEPARTMENT_OTHER): Payer: Self-pay

## 2022-11-03 ENCOUNTER — Other Ambulatory Visit: Payer: Self-pay

## 2022-11-03 MED ORDER — GABAPENTIN 300 MG PO CAPS
300.0000 mg | ORAL_CAPSULE | Freq: Every day | ORAL | 1 refills | Status: DC
  Filled 2022-11-03: qty 90, 90d supply, fill #0
  Filled 2023-01-28: qty 90, 90d supply, fill #1

## 2022-11-05 ENCOUNTER — Encounter: Payer: Self-pay | Admitting: Family

## 2022-11-05 ENCOUNTER — Other Ambulatory Visit (HOSPITAL_BASED_OUTPATIENT_CLINIC_OR_DEPARTMENT_OTHER): Payer: Self-pay

## 2022-11-05 MED ORDER — SODIUM BICARBONATE 650 MG PO TABS
650.0000 mg | ORAL_TABLET | Freq: Two times a day (BID) | ORAL | 0 refills | Status: DC
  Filled 2022-11-05: qty 180, 90d supply, fill #0

## 2022-11-17 ENCOUNTER — Other Ambulatory Visit (HOSPITAL_BASED_OUTPATIENT_CLINIC_OR_DEPARTMENT_OTHER): Payer: Self-pay

## 2022-11-18 ENCOUNTER — Other Ambulatory Visit (HOSPITAL_BASED_OUTPATIENT_CLINIC_OR_DEPARTMENT_OTHER): Payer: Self-pay

## 2022-11-18 MED ORDER — METFORMIN HCL ER 500 MG PO TB24
1000.0000 mg | ORAL_TABLET | Freq: Two times a day (BID) | ORAL | 0 refills | Status: DC
  Filled 2022-11-18: qty 360, 90d supply, fill #0

## 2022-11-19 ENCOUNTER — Other Ambulatory Visit (HOSPITAL_BASED_OUTPATIENT_CLINIC_OR_DEPARTMENT_OTHER): Payer: Self-pay

## 2022-11-24 ENCOUNTER — Other Ambulatory Visit (HOSPITAL_BASED_OUTPATIENT_CLINIC_OR_DEPARTMENT_OTHER): Payer: Self-pay

## 2022-11-25 ENCOUNTER — Other Ambulatory Visit (HOSPITAL_BASED_OUTPATIENT_CLINIC_OR_DEPARTMENT_OTHER): Payer: Self-pay

## 2022-11-25 MED ORDER — FREESTYLE LIBRE 2 SENSOR MISC
1 refills | Status: DC
  Filled 2022-11-25: qty 6, 84d supply, fill #0
  Filled 2023-02-15: qty 6, 84d supply, fill #1

## 2022-11-26 ENCOUNTER — Other Ambulatory Visit (HOSPITAL_BASED_OUTPATIENT_CLINIC_OR_DEPARTMENT_OTHER): Payer: Self-pay | Admitting: Internal Medicine

## 2022-11-26 ENCOUNTER — Other Ambulatory Visit (HOSPITAL_BASED_OUTPATIENT_CLINIC_OR_DEPARTMENT_OTHER): Payer: Self-pay

## 2022-11-27 ENCOUNTER — Observation Stay (HOSPITAL_COMMUNITY)
Admission: EM | Admit: 2022-11-27 | Discharge: 2022-11-28 | Disposition: A | Discharge status: Home / Self Care | Payer: PPO | Attending: Family Medicine | Admitting: Family Medicine

## 2022-11-27 ENCOUNTER — Encounter (HOSPITAL_COMMUNITY): Payer: Self-pay

## 2022-11-27 ENCOUNTER — Other Ambulatory Visit: Payer: Self-pay

## 2022-11-27 ENCOUNTER — Emergency Department (HOSPITAL_COMMUNITY): Payer: PPO

## 2022-11-27 ENCOUNTER — Observation Stay (HOSPITAL_BASED_OUTPATIENT_CLINIC_OR_DEPARTMENT_OTHER): Payer: PPO

## 2022-11-27 DIAGNOSIS — R000 Tachycardia, unspecified: Secondary | ICD-10-CM | POA: Diagnosis not present

## 2022-11-27 DIAGNOSIS — N183 Chronic kidney disease, stage 3 unspecified: Secondary | ICD-10-CM | POA: Diagnosis present

## 2022-11-27 DIAGNOSIS — F32A Depression, unspecified: Secondary | ICD-10-CM | POA: Diagnosis present

## 2022-11-27 DIAGNOSIS — K922 Gastrointestinal hemorrhage, unspecified: Secondary | ICD-10-CM | POA: Insufficient documentation

## 2022-11-27 DIAGNOSIS — I959 Hypotension, unspecified: Secondary | ICD-10-CM | POA: Diagnosis not present

## 2022-11-27 DIAGNOSIS — R55 Syncope and collapse: Principal | ICD-10-CM | POA: Diagnosis present

## 2022-11-27 DIAGNOSIS — Z7984 Long term (current) use of oral hypoglycemic drugs: Secondary | ICD-10-CM | POA: Insufficient documentation

## 2022-11-27 DIAGNOSIS — R195 Other fecal abnormalities: Secondary | ICD-10-CM | POA: Diagnosis not present

## 2022-11-27 DIAGNOSIS — N1831 Chronic kidney disease, stage 3a: Secondary | ICD-10-CM | POA: Insufficient documentation

## 2022-11-27 DIAGNOSIS — E1140 Type 2 diabetes mellitus with diabetic neuropathy, unspecified: Secondary | ICD-10-CM | POA: Diagnosis not present

## 2022-11-27 DIAGNOSIS — R58 Hemorrhage, not elsewhere classified: Secondary | ICD-10-CM | POA: Diagnosis not present

## 2022-11-27 DIAGNOSIS — I6381 Other cerebral infarction due to occlusion or stenosis of small artery: Secondary | ICD-10-CM

## 2022-11-27 DIAGNOSIS — Z79899 Other long term (current) drug therapy: Secondary | ICD-10-CM | POA: Diagnosis not present

## 2022-11-27 DIAGNOSIS — Z7902 Long term (current) use of antithrombotics/antiplatelets: Secondary | ICD-10-CM | POA: Insufficient documentation

## 2022-11-27 DIAGNOSIS — Z8673 Personal history of transient ischemic attack (TIA), and cerebral infarction without residual deficits: Secondary | ICD-10-CM | POA: Diagnosis not present

## 2022-11-27 DIAGNOSIS — Z87891 Personal history of nicotine dependence: Secondary | ICD-10-CM | POA: Insufficient documentation

## 2022-11-27 DIAGNOSIS — R739 Hyperglycemia, unspecified: Secondary | ICD-10-CM | POA: Diagnosis not present

## 2022-11-27 DIAGNOSIS — Z794 Long term (current) use of insulin: Secondary | ICD-10-CM | POA: Diagnosis not present

## 2022-11-27 DIAGNOSIS — R197 Diarrhea, unspecified: Secondary | ICD-10-CM | POA: Diagnosis not present

## 2022-11-27 DIAGNOSIS — K5790 Diverticulosis of intestine, part unspecified, without perforation or abscess without bleeding: Secondary | ICD-10-CM | POA: Diagnosis not present

## 2022-11-27 DIAGNOSIS — E1142 Type 2 diabetes mellitus with diabetic polyneuropathy: Secondary | ICD-10-CM | POA: Diagnosis not present

## 2022-11-27 DIAGNOSIS — K625 Hemorrhage of anus and rectum: Secondary | ICD-10-CM | POA: Diagnosis not present

## 2022-11-27 DIAGNOSIS — E119 Type 2 diabetes mellitus without complications: Secondary | ICD-10-CM

## 2022-11-27 DIAGNOSIS — K869 Disease of pancreas, unspecified: Secondary | ICD-10-CM | POA: Diagnosis not present

## 2022-11-27 DIAGNOSIS — K862 Cyst of pancreas: Secondary | ICD-10-CM | POA: Diagnosis not present

## 2022-11-27 DIAGNOSIS — K219 Gastro-esophageal reflux disease without esophagitis: Secondary | ICD-10-CM | POA: Diagnosis present

## 2022-11-27 DIAGNOSIS — E785 Hyperlipidemia, unspecified: Secondary | ICD-10-CM | POA: Diagnosis present

## 2022-11-27 DIAGNOSIS — I878 Other specified disorders of veins: Secondary | ICD-10-CM | POA: Diagnosis not present

## 2022-11-27 LAB — CBC WITH DIFFERENTIAL/PLATELET
Abs Immature Granulocytes: 0.06 10*3/uL (ref 0.00–0.07)
Basophils Absolute: 0.1 10*3/uL (ref 0.0–0.1)
Basophils Relative: 0 %
Eosinophils Absolute: 0 10*3/uL (ref 0.0–0.5)
Eosinophils Relative: 0 %
HCT: 35.3 % — ABNORMAL LOW (ref 39.0–52.0)
Hemoglobin: 11.7 g/dL — ABNORMAL LOW (ref 13.0–17.0)
Immature Granulocytes: 0 %
Lymphocytes Relative: 4 %
Lymphs Abs: 0.6 10*3/uL — ABNORMAL LOW (ref 0.7–4.0)
MCH: 31.8 pg (ref 26.0–34.0)
MCHC: 33.1 g/dL (ref 30.0–36.0)
MCV: 95.9 fL (ref 80.0–100.0)
Monocytes Absolute: 0.8 10*3/uL (ref 0.1–1.0)
Monocytes Relative: 6 %
Neutro Abs: 12.9 10*3/uL — ABNORMAL HIGH (ref 1.7–7.7)
Neutrophils Relative %: 90 %
Platelets: 248 10*3/uL (ref 150–400)
RBC: 3.68 MIL/uL — ABNORMAL LOW (ref 4.22–5.81)
RDW: 13 % (ref 11.5–15.5)
WBC: 14.5 10*3/uL — ABNORMAL HIGH (ref 4.0–10.5)
nRBC: 0 % (ref 0.0–0.2)

## 2022-11-27 LAB — PROTIME-INR
INR: 1.1 (ref 0.8–1.2)
Prothrombin Time: 14.1 seconds (ref 11.4–15.2)

## 2022-11-27 LAB — COMPREHENSIVE METABOLIC PANEL
ALT: 17 U/L (ref 0–44)
AST: 15 U/L (ref 15–41)
Albumin: 3.6 g/dL (ref 3.5–5.0)
Alkaline Phosphatase: 92 U/L (ref 38–126)
Anion gap: 9 (ref 5–15)
BUN: 20 mg/dL (ref 8–23)
CO2: 23 mmol/L (ref 22–32)
Calcium: 9.1 mg/dL (ref 8.9–10.3)
Chloride: 105 mmol/L (ref 98–111)
Creatinine, Ser: 1.3 mg/dL — ABNORMAL HIGH (ref 0.61–1.24)
GFR, Estimated: 60 mL/min — ABNORMAL LOW (ref >60)
Glucose, Bld: 297 mg/dL — ABNORMAL HIGH (ref 70–99)
Potassium: 4.3 mmol/L (ref 3.5–5.1)
Sodium: 137 mmol/L (ref 135–145)
Total Bilirubin: 0.4 mg/dL (ref 0.3–1.2)
Total Protein: 6.4 g/dL — ABNORMAL LOW (ref 6.5–8.1)

## 2022-11-27 LAB — TYPE AND SCREEN
ABO/RH(D): O NEG
Antibody Screen: NEGATIVE

## 2022-11-27 LAB — CBG MONITORING, ED
Glucose-Capillary: 95 mg/dL (ref 70–99)
Glucose-Capillary: 102 mg/dL — ABNORMAL HIGH (ref 70–99)
Glucose-Capillary: 107 mg/dL — ABNORMAL HIGH (ref 70–99)

## 2022-11-27 LAB — POC OCCULT BLOOD, ED: Fecal Occult Bld: POSITIVE — AB

## 2022-11-27 LAB — ECHOCARDIOGRAM COMPLETE
Area-P 1/2: 3.76 cm2
Calc EF: 68.1 %
S' Lateral: 2.4 cm
Single Plane A2C EF: 68.8 %
Single Plane A4C EF: 66.9 %

## 2022-11-27 LAB — HIV ANTIBODY (ROUTINE TESTING W REFLEX): HIV Screen 4th Generation wRfx: NONREACTIVE

## 2022-11-27 LAB — HEMOGLOBIN AND HEMATOCRIT, BLOOD
HCT: 29.8 % — ABNORMAL LOW (ref 39.0–52.0)
HCT: 31.2 % — ABNORMAL LOW (ref 39.0–52.0)
Hemoglobin: 9.9 g/dL — ABNORMAL LOW (ref 13.0–17.0)
Hemoglobin: 10.5 g/dL — ABNORMAL LOW (ref 13.0–17.0)

## 2022-11-27 LAB — LIPASE, BLOOD: Lipase: 38 U/L (ref 11–51)

## 2022-11-27 MED ORDER — MELATONIN 5 MG PO TABS
5.0000 mg | ORAL_TABLET | Freq: Every day | ORAL | Status: DC
  Administered 2022-11-27: 5 mg via ORAL
  Filled 2022-11-27: qty 1

## 2022-11-27 MED ORDER — TAMSULOSIN HCL 0.4 MG PO CAPS
0.4000 mg | ORAL_CAPSULE | Freq: Every day | ORAL | Status: DC
  Administered 2022-11-27: 0.4 mg via ORAL
  Filled 2022-11-27: qty 1

## 2022-11-27 MED ORDER — GABAPENTIN 300 MG PO CAPS
300.0000 mg | ORAL_CAPSULE | Freq: Every day | ORAL | Status: DC
  Administered 2022-11-27: 300 mg via ORAL
  Filled 2022-11-27 (×2): qty 1

## 2022-11-27 MED ORDER — SODIUM CHLORIDE 0.9 % IV BOLUS
1000.0000 mL | Freq: Once | INTRAVENOUS | Status: AC
  Administered 2022-11-27: 1000 mL via INTRAVENOUS

## 2022-11-27 MED ORDER — SODIUM CHLORIDE (PF) 0.9 % IJ SOLN
INTRAMUSCULAR | Status: AC
  Filled 2022-11-27: qty 50

## 2022-11-27 MED ORDER — PERFLUTREN LIPID MICROSPHERE
1.0000 mL | INTRAVENOUS | Status: AC | PRN
  Administered 2022-11-27: 3 mL via INTRAVENOUS

## 2022-11-27 MED ORDER — SERTRALINE HCL 100 MG PO TABS
100.0000 mg | ORAL_TABLET | Freq: Every day | ORAL | Status: DC
  Administered 2022-11-27 – 2022-11-28 (×2): 100 mg via ORAL
  Filled 2022-11-27: qty 2
  Filled 2022-11-27: qty 1

## 2022-11-27 MED ORDER — SODIUM CHLORIDE 0.9 % IV SOLN: INTRAVENOUS | Status: DC

## 2022-11-27 MED ORDER — DORZOLAMIDE HCL 2 % OP SOLN
1.0000 [drp] | Freq: Two times a day (BID) | OPHTHALMIC | Status: DC
  Filled 2022-11-27: qty 10

## 2022-11-27 MED ORDER — INSULIN ASPART 100 UNIT/ML IJ SOLN
0.0000 [IU] | Freq: Three times a day (TID) | INTRAMUSCULAR | Status: DC
  Administered 2022-11-28: 1 [IU] via SUBCUTANEOUS
  Filled 2022-11-27: qty 0.09

## 2022-11-27 MED ORDER — SODIUM CHLORIDE 0.9% FLUSH
3.0000 mL | Freq: Two times a day (BID) | INTRAVENOUS | Status: DC
  Administered 2022-11-27 – 2022-11-28 (×2): 3 mL via INTRAVENOUS

## 2022-11-27 MED ORDER — SIMVASTATIN 20 MG PO TABS
20.0000 mg | ORAL_TABLET | Freq: Every day | ORAL | Status: DC
  Administered 2022-11-27: 20 mg via ORAL
  Filled 2022-11-27: qty 1

## 2022-11-27 MED ORDER — INSULIN ASPART 100 UNIT/ML IJ SOLN
0.0000 [IU] | Freq: Every day | INTRAMUSCULAR | Status: DC
  Filled 2022-11-27: qty 0.05

## 2022-11-27 MED ORDER — IOHEXOL 350 MG/ML SOLN
100.0000 mL | Freq: Once | INTRAVENOUS | Status: AC | PRN
  Administered 2022-11-27: 100 mL via INTRAVENOUS

## 2022-11-27 MED ORDER — LATANOPROST 0.005 % OP SOLN
1.0000 [drp] | Freq: Every day | OPHTHALMIC | Status: DC
  Filled 2022-11-27: qty 2.5

## 2022-11-27 MED ORDER — PANTOPRAZOLE SODIUM 40 MG IV SOLR
40.0000 mg | INTRAVENOUS | Status: DC
  Administered 2022-11-28: 40 mg via INTRAVENOUS
  Filled 2022-11-27: qty 10

## 2022-11-27 MED ORDER — PANTOPRAZOLE SODIUM 40 MG IV SOLR
40.0000 mg | Freq: Once | INTRAVENOUS | Status: AC
  Administered 2022-11-27: 40 mg via INTRAVENOUS
  Filled 2022-11-27: qty 10

## 2022-11-27 MED ORDER — SODIUM CHLORIDE 0.9 % IV SOLN: Freq: Once | INTRAVENOUS | Status: AC

## 2022-11-27 MED ORDER — SODIUM BICARBONATE 650 MG PO TABS
650.0000 mg | ORAL_TABLET | Freq: Two times a day (BID) | ORAL | Status: DC
  Administered 2022-11-27 – 2022-11-28 (×2): 650 mg via ORAL
  Filled 2022-11-27 (×2): qty 1

## 2022-11-27 NOTE — Consult Note (Signed)
Consultation Note   Referring Provider:  [facility] PCP: Mckinley Jewel, MD Primary Gastroenterologist: Harl Bowie, MD Reason for consultation: GI bleed   Hospital Day: 1  Assessment    Patient profile:  Adam Quinn is a 67 y.o. male with a past medical history significant for diverticulosis, diverticulitis, DM2, CVA on plavix, OSA on CPAP,  and chronic iron deficiency anemia followed by Hematology  . See PMH for any additional medical problems. Admitted  # 67 yo male with recurrent GI bleeding on plavix. Diverticular hemorrhage? CTA negative for active bleeding. Started last night with nausea, vomiting, diarrhea. Having episodes of maroon colored stools. Hgb 11.7, down from 13.9 a several days ago. Previous episodes of GI bleeding felt to be diverticular. Current bleeding may also be diverticular ( ? Right sided). However, upper GI bleed is not excluded given syncopal, episode though normal BUN reassuring. It isn't clear why the bleeding was preceded by nausea / vomiting. Bleeding can cause loose stools so the diarrhea is not unexpected. Could have gastroenteritis. WBC 14.5 but that could be reactive to GI bleed.      # Chronic IDA. Followed by Hematology, gets IV iron, last dose 09/24/22.   # History of CVA on plavix at home. Last dose was last night  # DM   # Subcentimeter but progressively calcifying lesion of the pancreatic head/uncinate on CT scan   # See PMH for additional medical problems  Plan   Trend Hgb.  Supportive care Admitting team has ordered stool studies. While infection seems   unlikely it is reasonable to check Low suspicion of upper source of bleeding but will start 24 hour PPI on side of caution    HPI   [name] was hospitalized in Sept 2022 and again in April 2023 with a GI bleed (suspected diverticular hemorrhage).  Small bowel video capsule study in May 2023 was unrevealing.  He has chronic  iron deficiency anemia followed by hematology.  He gets periodic IV iron, last infusion was 09/24/2022 .   ED course:  Patient presented to the ED this a.m for evaluation of rectal bleeding and syncope.  He developed nausea, vomiting and diarrhea last night after dinner.  No associated abdominal pain . Didn't  eat anything unusual. No known sick contacts. No associated fevers. First few episodes of diarrhea last night may have contained blood but he is unsure. Later episodes did contain dark red blood. Had syncopal episode last night . He takes fiber everyday and reports normal BM in general. .   Last episode of hematochezia was around 1:20 am. Total of 4 episodes of bleeding since onset last evening.  Last dose of plavix was last night.    Significant studies this admission   Normotensive, slightly tachycardic Labs:  WBC 14.5 Hgb 11. 7 ( down from 13.9) 11/28 Platelets 248 BUN Cr 1.3  ( at baseline) Liver chemistries normal.   Previous GI Evaluation     Sept 2022 EGD for suspected bleeding -Z-line regular, 38 cm from the incisors. - No gross lesions in esophagus. - Normal stomach. - Normal examined duodenum. - No specimens collected.  01/30/22 Colonoscopy  -Stool in the entire examined colon. - One 12 mm polyp in the  ascending colon, removed with a cold snare. Resected and retrieved. - One 4 mm polyp in the rectum, removed with a cold snare. Resected and retrieved. - Severe diverticulosis in the sigmoid colon, in the descending colon, in the transverse colon, in the ascending colon and in the cecum. There was evidence of diverticular spasm. Peri-diverticular erythema was seen. There was evidence of an impacted diverticulum. - Non-bleeding external and internal hemorrhoids.  A. COLON, ASCENDING, POLYPECTOMY:  Tubular adenoma  Negative for high-grade dysplasia and carcinoma   B. RECTUM, POLYPECTOMY:  Hyperplastic polyp  Negative for dysplasia and carcinoma    May 2023  Video  capsule study - one erythematous gastric fold, otherwise normal.  -Adequate prep  Recent Labs and Imaging CT ANGIO GI BLEED  Result Date: 11/27/2022 CLINICAL DATA:  66 year old male with 1 episode of bloody diarrhea. Positive fecal occult blood test. EXAM: CTA ABDOMEN AND PELVIS WITHOUT AND WITH CONTRAST TECHNIQUE: Multidetector CT imaging of the abdomen and pelvis was performed using the standard protocol during bolus administration of intravenous contrast. Multiplanar reconstructed images and MIPs were obtained and reviewed to evaluate the vascular anatomy. RADIATION DOSE REDUCTION: This exam was performed according to the departmental dose-optimization program which includes automated exposure control, adjustment of the mA and/or kV according to patient size and/or use of iterative reconstruction technique. CONTRAST:  168m OMNIPAQUE IOHEXOL 350 MG/ML SOLN COMPARISON:  Prior CTA abdomen and pelvis 03/23/2022 and earlier. FINDINGS: VASCULAR Normal caliber abdominal aorta. Minimal calcified atherosclerosis for age. Major aortic branches are patent including the celiac axis, SMA, IMA. Negative for aortic aneurysm or dissection. Unremarkable for age visible iliofemoral arteries. On the delayed phase the portal venous system is patent. Review of the MIP images confirms the above findings. NON-VASCULAR Lower chest: Multilobar left lower lung opacity seen last year has cleared. Residual lung base atelectasis. Calcified coronary artery atherosclerosis. No pericardial or pleural effusion. Hepatobiliary: Stable, negative liver and gallbladder. Pancreas: Small dystrophic calcification of the pancreatic head/uncinate has increased since last year on series 3, image 358 This corresponds to a subcentimeter hypodense lesion in 2022. But no progressive pancreatic mass or inflammation. No ductal dilatation. Spleen: Negative. Adrenals/Urinary Tract: Stable and negative. Decompressed ureters. Distended but otherwise negative  bladder. Incidental pelvic phleboliths. Stomach/Bowel: Similar widespread large bowel retained stool. Redundant sigmoid colon and bilateral flexures with diffuse diverticulosis throughout the large bowel. Normal appendix on series 12, image 54. Comparing pre contrast and postcontrast images, no large bowel contrast extravasation is identified. Occasional hyperdense material retained within the large bowel diverticula. Negative terminal ileum. No dilated small bowel. Decompressed stomach and duodenum. No free air or free fluid. No mesenteric inflammation identified. Lymphatic: No lymphadenopathy identified. Reproductive: Negative. Other: No pelvic free fluid. Musculoskeletal: No acute osseous abnormality identified. Mild ventral right abdominal wall ground-glass opacity on series 12, image 73 likely related to subcutaneous injection there. IMPRESSION: 1. Diffuse diverticulosis throughout the large bowel. No active diverticulitis identified, and negative for active GI bleeding by CTA. 2. Subcentimeter but progressively calcifying lesion of the pancreatic head/uncinate (series 6, image 838 which was first identified as hypoenhancing in September 2022. Recommend document 10 years of stability by repeat Abdomen CT or MRI every 2 years (pancreatic mass protocol without and with contrast). This recommendation follows ACR consensus guidelines: Management of Incidental Pancreatic Cysts: A White Paper of the ACR Incidental Findings Committee. J Am Coll Radiol 24287;[id number] 3. No other acute or inflammatory process identified in the abdomen or pelvis. 4. Calcified coronary artery atherosclerosis. Electronically  Signed   By: Genevie Ann M.D.   On: 11/27/2022 07:02    Labs:  Recent Labs    11/27/22 0344  WBC 14.5*  HGB 11.7*  HCT 35.3*  PLT 248   Recent Labs    11/27/22 0344  NA 137  K 4.3  CL 105  CO2 23  GLUCOSE 297*  BUN 20  CREATININE 1.30*  CALCIUM 9.1   Recent Labs    11/27/22 0344  PROT 6.4*   ALBUMIN 3.6  AST 15  ALT 17  ALKPHOS 92  BILITOT 0.4   No results for input(s): "HEPBSAG", "HCVAB", "HEPAIGM", "HEPBIGM" in the last 72 hours. Recent Labs    11/27/22 0344  LABPROT 14.1  INR 1.1    Past Medical History:  Diagnosis Date   Allergy    seasonal   Anxiety    Cataract    cataract surgery bilaterally resolved issues   Central scotoma    Chronic kidney disease    small protien showing uses Lisinopril for this   Diverticulitis    Diverticulosis    DM (diabetes mellitus) ([place])    GERD (gastroesophageal reflux disease)    Glaucoma    History of colon polyps    History of hiatal hernia    History of kidney stones    x1   Iron deficiency anemia    Iron infusion 04/29/2019   Neuropathy    OSA on CPAP    Stroke ([place])    Wears glasses    READING    Past Surgical History:  Procedure Laterality Date   cataract surgery     bilateral   COLONOSCOPY     COLONOSCOPY N/A 06/04/2015   Procedure: COLONOSCOPY;  Surgeon: Inda Castle, MD;  Location: [facility];  Service: Endoscopy;  Laterality: N/A;   COLONOSCOPY WITH PROPOFOL N/A 05/09/2019   Procedure: COLONOSCOPY WITH PROPOFOL;  Surgeon: Doran Stabler, MD;  Location: [facility];  Service: Gastroenterology;  Laterality: N/A;   COLONOSCOPY WITH PROPOFOL N/A 01/30/2022   Procedure: COLONOSCOPY WITH PROPOFOL;  Surgeon: Mauri Pole, MD;  Location: [facility];  Service: Endoscopy;  Laterality: N/A;   ESOPHAGOGASTRODUODENOSCOPY N/A 06/04/2015   Procedure: ESOPHAGOGASTRODUODENOSCOPY (EGD);  Surgeon: Inda Castle, MD;  Location: [facility];  Service: Endoscopy;  Laterality: N/A;   ESOPHAGOGASTRODUODENOSCOPY (EGD) WITH PROPOFOL N/A 08/02/2021   Procedure: ESOPHAGOGASTRODUODENOSCOPY (EGD) WITH PROPOFOL;  Surgeon: Mauri Pole, MD;  Location: [facility];  Service: Endoscopy;  Laterality: N/A;   POLYPECTOMY  01/30/2022   Procedure: POLYPECTOMY;  Surgeon: Mauri Pole, MD;  Location: [facility];   Service: Endoscopy;;   UPPER GASTROINTESTINAL ENDOSCOPY     WISDOM TOOTH EXTRACTION     with sedation    Family History  Problem Relation Age of Onset   Colon polyps Father    Lung cancer Father    Diabetes Mother    Dementia Mother    Diabetes Sister        x 2   Stroke Maternal Grandmother    Stroke Paternal Grandfather    Colon cancer Neg Hx    Rectal cancer Neg Hx    Stomach cancer Neg Hx    Pancreatic cancer Neg Hx     Prior to Admission medications   Medication Sig Start Date End Date Taking? Authorizing Provider  bimatoprost (LUMIGAN) 0.01 % SOLN Instill 1 drop in both eyes at bedtime 01/09/22     cefdinir (OMNICEF) 300 MG capsule Take 1 capsule (300 mg  total) by mouth 2 (two) times daily. Start 5/3 03/25/22   Samuella Cota, MD  clopidogrel (PLAVIX) 75 MG tablet Take 1 tablet (75 mg total) by mouth daily. 09/01/22   Frann Rider, NP  Continuous Blood Gluc Sensor (FREESTYLE LIBRE 2 SENSOR) MISC Apply sensor to body externally every 2 weeks as directed. To read blood sugar continuously. 11/25/22   Mckinley Jewel, MD  COVID-19 At Home Antigen Test [facility] COVID-19 HOME TEST) KIT Use as directed 11/14/21   Edmon Crape, RPH  dorzolamide (TRUSOPT) 2 % ophthalmic solution Instill 1 drop in both eyes twice a day 01/09/22     dorzolamide (TRUSOPT) 2 % ophthalmic solution Place 1 drop in both eyes twice a day 09/05/22     dorzolamide (TRUSOPT) 2 % ophthalmic solution Place 1 drop into both eyes 2 (two) times daily. 10/23/22     Dulaglutide (TRULICITY) 1.5 ZO/1.0RU SOPN Inject under the skin once a week Patient taking differently: Inject 1.5 mg into the skin every Sunday. 09/27/21     Dulaglutide (TRULICITY) 1.5 EA/5.4UJ SOPN Inject 0.5 mg as directed every 7 (seven) days. 10/02/22     ELDERBERRY PO Take 1 tablet by mouth at bedtime. Gummy    [provider]  gabapentin (NEURONTIN) 300 MG capsule Take 1 capsule (300 mg total) by mouth daily. 11/03/22     insulin  degludec (TRESIBA FLEXTOUCH) 200 UNIT/ML FlexTouch Pen Inject 50 units under the skin 1 - 2 times per week Patient taking differently: Inject 30 Units into the skin daily as needed (if BG is reading 150 or more). 08/27/21     insulin degludec (TRESIBA FLEXTOUCH) 200 UNIT/ML FlexTouch Pen Inject 50 Units into the skin 1 (one) to 2 (two) times a week. 08/14/22     insulin degludec (TRESIBA FLEXTOUCH) 200 UNIT/ML FlexTouch Pen Inject 50 Units into the skin 1 (one) to 2 (two) times a week. 09/18/22     insulin degludec (TRESIBA FLEXTOUCH) 200 UNIT/ML FlexTouch Pen Inject 30 Units into the skin daily. 09/22/22     Insulin Pen Needle 31G X 5 MM MISC Use as directed with Tyler Aas 08/27/21     lansoprazole (PREVACID) 30 MG capsule Take 1 capsule (30 mg total) by mouth 2 (two) times daily. 09/04/22     loratadine-pseudoephedrine (ALAVERT D-12 HOUR ALLERGY/CONG) 5-120 MG tablet Take 1 tablet by mouth at bedtime.    [provider]  Melatonin 5 MG CHEW Chew 5 mg by mouth at bedtime.    [provider]  metFORMIN (GLUCOPHAGE-XR) 500 MG 24 hr tablet Take 2 tablets (1,000 mg total) by mouth 2 (two) times daily. 11/18/22     Probiotic Product (RA PROBIOTIC GUMMIES PO) Take 1 capsule by mouth at bedtime.    [provider]  sertraline (ZOLOFT) 100 MG tablet Take 1 tablet (100 mg total) by mouth daily. 10/28/22     simvastatin (ZOCOR) 20 MG tablet Take 1 tablet by mouth once a day 08/20/22     sodium bicarbonate 650 MG tablet Take 1 tablet by mouth 2 times daily 11/05/22     tamsulosin (FLOMAX) 0.4 MG CAPS capsule Take 1 capsule (0.4 mg total) by mouth at bedtime. 08/07/22       No current facility-administered medications for this encounter.   Current Outpatient Medications  Medication Sig Dispense Refill   bimatoprost (LUMIGAN) 0.01 % SOLN Instill 1 drop in both eyes at bedtime 7.5 mL 3   cefdinir (OMNICEF) 300 MG capsule Take 1  capsule (300 mg total) by mouth 2 (two) times daily. Start  5/3 4 capsule 0   clopidogrel (PLAVIX) 75 MG tablet Take 1 tablet (75 mg total) by mouth daily. 90 tablet 1   Continuous Blood Gluc Sensor (FREESTYLE LIBRE 2 SENSOR) MISC Apply sensor to body externally every 2 weeks as directed. To read blood sugar continuously. 6 each 1   COVID-19 At Home Antigen Test ([facility] COVID-19 HOME TEST) KIT Use as directed 4 each 0   dorzolamide (TRUSOPT) 2 % ophthalmic solution Instill 1 drop in both eyes twice a day 10 mL 4   dorzolamide (TRUSOPT) 2 % ophthalmic solution Place 1 drop in both eyes twice a day 30 mL 3   dorzolamide (TRUSOPT) 2 % ophthalmic solution Place 1 drop into both eyes 2 (two) times daily. 30 mL 3   Dulaglutide (TRULICITY) 1.5 GB/1.5VV SOPN Inject under the skin once a week (Patient taking differently: Inject 1.5 mg into the skin every Sunday.) 6 mL 4   Dulaglutide (TRULICITY) 1.5 OH/6.0VP SOPN Inject 0.5 mg as directed every 7 (seven) days. 6 mL 0   ELDERBERRY PO Take 1 tablet by mouth at bedtime. Gummy     gabapentin (NEURONTIN) 300 MG capsule Take 1 capsule (300 mg total) by mouth daily. 90 capsule 1   insulin degludec (TRESIBA FLEXTOUCH) 200 UNIT/ML FlexTouch Pen Inject 50 units under the skin 1 - 2 times per week (Patient taking differently: Inject 30 Units into the skin daily as needed (if BG is reading 150 or more).) 9 mL 1   insulin degludec (TRESIBA FLEXTOUCH) 200 UNIT/ML FlexTouch Pen Inject 50 Units into the skin 1 (one) to 2 (two) times a week. 9 mL 1   insulin degludec (TRESIBA FLEXTOUCH) 200 UNIT/ML FlexTouch Pen Inject 50 Units into the skin 1 (one) to 2 (two) times a week. 9 mL 1   insulin degludec (TRESIBA FLEXTOUCH) 200 UNIT/ML FlexTouch Pen Inject 30 Units into the skin daily. 9 mL 1   Insulin Pen Needle 31G X 5 MM MISC Use as directed with Tresiba 100 each 1   lansoprazole (PREVACID) 30 MG capsule Take 1 capsule (30 mg total) by mouth 2 (two) times daily. 180 capsule 2   loratadine-pseudoephedrine (ALAVERT D-12 HOUR  ALLERGY/CONG) 5-120 MG tablet Take 1 tablet by mouth at bedtime.     Melatonin 5 MG CHEW Chew 5 mg by mouth at bedtime.     metFORMIN (GLUCOPHAGE-XR) 500 MG 24 hr tablet Take 2 tablets (1,000 mg total) by mouth 2 (two) times daily. 360 tablet 0   Probiotic Product (RA PROBIOTIC GUMMIES PO) Take 1 capsule by mouth at bedtime.     sertraline (ZOLOFT) 100 MG tablet Take 1 tablet (100 mg total) by mouth daily. 90 tablet 0   simvastatin (ZOCOR) 20 MG tablet Take 1 tablet by mouth once a day 90 tablet 1   sodium bicarbonate 650 MG tablet Take 1 tablet by mouth 2 times daily 180 tablet 0   tamsulosin (FLOMAX) 0.4 MG CAPS capsule Take 1 capsule (0.4 mg total) by mouth at bedtime. 90 capsule 3    Allergies as of 11/27/2022 - Review Complete 11/27/2022  Allergen Reaction Noted   Aspirin Anaphylaxis and Hives 03/16/2008   Shrimp [shellfish allergy] Hives 05/21/2015    Social History   Socioeconomic History   Marital status: Married    Spouse name: Not on file   Number of children: 3   Years of education: Not on file  Highest education level: Not on file  Occupational History   Occupation: Optometrist  Tobacco Use   Smoking status: Former    Types: Cigarettes    Quit date: 10/04/1997    Years since quitting: 25.1   Smokeless tobacco: Never  Vaping Use   Vaping Use: Never used  Substance and Sexual Activity   Alcohol use: No   Drug use: No   Sexual activity: Not on file  Other Topics Concern   Not on file  Social History Narrative   Not on file   Social Determinants of Health   Financial Resource Strain: Not on file  Food Insecurity: No Food Insecurity (08/08/2020)   Hunger Vital Sign    Worried About Running Out of Food in the Last Year: Never true    Ran Out of Food in the Last Year: Never true  Transportation Needs: No Transportation Needs (08/08/2020)   PRAPARE - Hydrologist (Medical): No    Lack of Transportation (Non-Medical): No  Physical  Activity: Not on file  Stress: Not on file  Social Connections: Not on file  Intimate Partner Violence: Not on file    Review of Systems: All systems reviewed and negative except where noted in HPI.  Physical Exam: Vital signs in last 24 hours: Temp:  [97.5 F (36.4 C)-97.8 F (36.6 C)] 97.8 F (36.6 C) (01/04 0653) Pulse Rate:  [98-118] 100 (01/04 0900) Resp:  [11-19] 16 (01/04 0900) BP: (111-138)/(63-97) 119/82 (01/04 0900) SpO2:  [94 %-99 %] 97 % (01/04 0900)    General:  Alert male in NAD Psych:  Pleasant, cooperative. Normal mood and affect Eyes: Pupils equal Ears:  Normal auditory acuity Nose: No deformity, discharge or lesions Neck:  Supple, no masses felt Lungs:  Clear to auscultation.  Heart:  Regular rate, regular rhythm.  Abdomen:  Soft, nondistended, nontender, active bowel sounds, no masses felt Rectal :  soft , unformed dark red stool in vault.  Msk: Symmetrical without gross deformities.  Neurologic:  Alert, oriented, grossly normal neurologically Extremities : No edema Skin:  Intact without significant lesions.    Intake/Output from previous day: No intake/output data recorded. Intake/Output this shift:  No intake/output data recorded.    Active Problems:   * No active hospital problems. Tye Savoy, NP-C @  11/27/2022, 10:22 AM

## 2022-11-27 NOTE — ED Provider Notes (Signed)
[facility] DEPT Provider Note   CSN: [id number] Arrival date & time: 11/27/22  0240     History  Chief Complaint  Patient presents with   GI Bleeding    Adam Quinn is a 66 y.o. male.  HPI   Medical history including diabetes, CVA currently on Plaquenil, chronic anemia receiving iron infusions, diverticulitis presents with complaints of syncope as well as bloody stools.  Patient states that symptoms started last night around 8 PM, he states that the episode of diarrhea, he then had a second episode was straight blood, he states that he went to stand up got dizzy and passed out.  He denies hitting his head or lose conscious, he is not on anticoag's.  He states prior to passing out he was having no chest pain or shortness of breath, did not become diaphoretic, he states that he currently has no complaints this time denies headache change in vision paresthesias weakness upper lower extremities, he has no stomach at this time.  He has no history of C. difficile no recent antibiotics no recent travels, does endorse any cough congestion general body aches.  I reviewed patient's chart he has been seen in the past for concerns of a GI bleed, patient recently nuclear medicine gastrointestinal bleeding scan which was negative acute findings,  had small bowel video capsule Apr 04, 2022 negative for source of GI blood loss, colonoscopy showing evidence of diverticula.   Home Medications Prior to Admission medications   Medication Sig Start Date End Date Taking? Authorizing Provider  bimatoprost (LUMIGAN) 0.01 % SOLN Instill 1 drop in both eyes at bedtime 01/09/22     cefdinir (OMNICEF) 300 MG capsule Take 1 capsule (300 mg total) by mouth 2 (two) times daily. Start 5/3 03/25/22   Samuella Cota, MD  clopidogrel (PLAVIX) 75 MG tablet Take 1 tablet (75 mg total) by mouth daily. 09/01/22   Frann Rider, NP  Continuous Blood Gluc Sensor (FREESTYLE LIBRE 2 SENSOR) MISC  Apply sensor to body externally every 2 weeks as directed. To read blood sugar continuously. 11/25/22   Mckinley Jewel, MD  COVID-19 At Home Antigen Test [facility] COVID-19 HOME TEST) KIT Use as directed 11/14/21   Edmon Crape, RPH  dorzolamide (TRUSOPT) 2 % ophthalmic solution Instill 1 drop in both eyes twice a day 01/09/22     dorzolamide (TRUSOPT) 2 % ophthalmic solution Place 1 drop in both eyes twice a day 09/05/22     dorzolamide (TRUSOPT) 2 % ophthalmic solution Place 1 drop into both eyes 2 (two) times daily. 10/23/22     Dulaglutide (TRULICITY) 1.5 TI/1.4ER SOPN Inject under the skin once a week Patient taking differently: Inject 1.5 mg into the skin every Sunday. 09/27/21     Dulaglutide (TRULICITY) 1.5 XV/4.0GQ SOPN Inject 0.5 mg as directed every 7 (seven) days. 10/02/22     ELDERBERRY PO Take 1 tablet by mouth at bedtime. Gummy    [provider]  gabapentin (NEURONTIN) 300 MG capsule Take 1 capsule (300 mg total) by mouth daily. 11/03/22     insulin degludec (TRESIBA FLEXTOUCH) 200 UNIT/ML FlexTouch Pen Inject 50 units under the skin 1 - 2 times per week Patient taking differently: Inject 30 Units into the skin daily as needed (if BG is reading 150 or more). 08/27/21     insulin degludec (TRESIBA FLEXTOUCH) 200 UNIT/ML FlexTouch Pen Inject 50 Units into the skin 1 (one) to 2 (two) times a week. 08/14/22  insulin degludec (TRESIBA FLEXTOUCH) 200 UNIT/ML FlexTouch Pen Inject 50 Units into the skin 1 (one) to 2 (two) times a week. 09/18/22     insulin degludec (TRESIBA FLEXTOUCH) 200 UNIT/ML FlexTouch Pen Inject 30 Units into the skin daily. 09/22/22     Insulin Pen Needle 31G X 5 MM MISC Use as directed with Tyler Aas 08/27/21     lansoprazole (PREVACID) 30 MG capsule Take 1 capsule (30 mg total) by mouth 2 (two) times daily. 09/04/22     loratadine-pseudoephedrine (ALAVERT D-12 HOUR ALLERGY/CONG) 5-120 MG tablet Take 1 tablet by mouth at bedtime.    [provider]   Melatonin 5 MG CHEW Chew 5 mg by mouth at bedtime.    [provider]  metFORMIN (GLUCOPHAGE-XR) 500 MG 24 hr tablet Take 2 tablets (1,000 mg total) by mouth 2 (two) times daily. 11/18/22     Probiotic Product (RA PROBIOTIC GUMMIES PO) Take 1 capsule by mouth at bedtime.    [provider]  sertraline (ZOLOFT) 100 MG tablet Take 1 tablet (100 mg total) by mouth daily. 10/28/22     simvastatin (ZOCOR) 20 MG tablet Take 1 tablet by mouth once a day 08/20/22     sodium bicarbonate 650 MG tablet Take 1 tablet by mouth 2 times daily 11/05/22     tamsulosin (FLOMAX) 0.4 MG CAPS capsule Take 1 capsule (0.4 mg total) by mouth at bedtime. 08/07/22         Allergies    Aspirin and Shrimp [shellfish allergy]    Review of Systems   Review of Systems  Constitutional:  Negative for chills and fever.  Respiratory:  Negative for shortness of breath.   Cardiovascular:  Negative for chest pain.  Gastrointestinal:  Positive for diarrhea. Negative for abdominal pain.  Neurological:  Positive for syncope. Negative for headaches.    Physical Exam Updated Vital Signs BP 123/74   Pulse (!) 103   Temp (!) 97.5 F (36.4 C) (Oral)   Resp 13   SpO2 97%  Physical Exam Vitals and nursing note reviewed. Exam conducted with a chaperone present.  Constitutional:      General: He is not in acute distress.    Appearance: He is not ill-appearing.  HENT:     Head: Normocephalic and atraumatic.     Comments: No deformity the head present no raccoon eyes or Battle sign noted.    Nose: No congestion.     Mouth/Throat:     Mouth: Mucous membranes are moist.     Pharynx: Oropharynx is clear.     Comments: No trismus no torticollis no oral trauma Eyes:     Extraocular Movements: Extraocular movements intact.     Conjunctiva/sclera: Conjunctivae normal.     Pupils: Pupils are equal, round, and reactive to light.  Cardiovascular:     Rate and Rhythm: Regular rhythm. Tachycardia present.      Pulses: Normal pulses.     Heart sounds: No murmur heard.    No friction rub. No gallop.  Pulmonary:     Effort: No respiratory distress.     Breath sounds: No wheezing, rhonchi or rales.  Abdominal:     General: There is distension.     Palpations: Abdomen is soft.     Tenderness: There is no abdominal tenderness. There is no right CVA tenderness or left CVA tenderness.     Comments: Abdomen is distended, dull to percussion, there is no guarding or tension peritoneal sign negative Murphy sign McBurney  point.  Genitourinary:    Comments: Chaperone present rectum was visualized there is noted bloody stool around the anus, digital rectal exam was performed no palpable mass present, patient had bloody stools no melena stool present.  He is not actively bleeding during my examination Skin:    General: Skin is warm and dry.     Coloration: Skin is pale.  Neurological:     Mental Status: He is alert.     GCS: GCS eye subscore is 4. GCS verbal subscore is 5. GCS motor subscore is 6.     Cranial Nerves: Cranial nerves 2-12 are intact.     Sensory: Sensation is intact.     Motor: No weakness.     Comments: Cranial nerves II through XII grossly intact no difficulty with word finding following two-step commands there is no unilateral weakness present.  Psychiatric:        Mood and Affect: Mood normal.     ED Results / Procedures / Treatments   Labs (all labs ordered are listed, but only abnormal results are displayed) Labs Reviewed  COMPREHENSIVE METABOLIC PANEL - Abnormal; Notable for the following components:      Result Value   Glucose, Bld 297 (*)    Creatinine, Ser 1.30 (*)    Total Protein 6.4 (*)    GFR, Estimated 60 (*)    All other components within normal limits  CBC WITH DIFFERENTIAL/PLATELET - Abnormal; Notable for the following components:   WBC 14.5 (*)    RBC 3.68 (*)    Hemoglobin 11.7 (*)    HCT 35.3 (*)    Neutro Abs 12.9 (*)    Lymphs Abs 0.6 (*)    All other  components within normal limits  POC OCCULT BLOOD, ED - Abnormal; Notable for the following components:   Fecal Occult Bld POSITIVE (*)    All other components within normal limits  GASTROINTESTINAL PANEL BY PCR, STOOL (REPLACES STOOL CULTURE)  C DIFFICILE QUICK SCREEN W PCR REFLEX    PROTIME-INR  LIPASE, BLOOD  TYPE AND SCREEN    EKG None  Radiology No results found.  Procedures Procedures    Medications Ordered in ED Medications  sodium chloride (PF) 0.9 % injection (has no administration in time range)  pantoprazole (PROTONIX) injection 40 mg (40 mg Intravenous Given 11/27/22 0405)  sodium chloride 0.9 % bolus 1,000 mL (1,000 mLs Intravenous Bolus 11/27/22 0406)  iohexol (OMNIPAQUE) 350 MG/ML injection 100 mL (100 mLs Intravenous Contrast Given 11/27/22 2831)    ED Course/ Medical Decision Making/ A&P                           Medical Decision Making Amount and/or Complexity of Data Reviewed Labs: ordered. Radiology: ordered.  Risk Prescription drug management.   This patient presents to the ED for concern of syncope, bloody stools, this involves an extensive number of treatment options, and is a complaint that carries with it a high risk of complications and morbidity.  The differential diagnosis includes GI bleed, diverticulitis, ischemic colitis    Additional history obtained:  Additional history obtained from N/A External records from outside source obtained and reviewed including GI notes, hospital notes   Co morbidities that complicate the patient evaluation  Antiplatelet therapy chronic anemia  Social Determinants of Health:  N/A    Lab Tests:  I Ordered, and personally interpreted labs.  The pertinent results include: Hemoccult positive, CBC leukocytosis of 14.5, normocytic  anemia at 11.7, CMP shows glucose of 297 creatinine 1.3, prothrombin time INR unremarkable   Imaging Studies ordered:  I ordered imaging studies including CT angio GI  bleed I independently visualized and interpreted imaging which showed pending at this time I agree with the radiologist interpretation   Cardiac Monitoring:  The patient was maintained on a cardiac monitor.  I personally viewed and interpreted the cardiac monitored which showed an underlying rhythm of: Without signs of ischemia   Medicines ordered and prescription drug management:  I ordered medication including fluids, Protonix I have reviewed the patients home medicines and have made adjustments as needed  Critical Interventions:  N/a   Reevaluation:  Presents syncope and bloody stools, on my exam he is pale in appearance, he had a positive Hemoccult with frank blood noted in his stool, I am concerned for possible lower GI bleed, he is hemodynamically stable, will start on Protonix, fluids, pain lab work likely proceed with CT imaging.  Due to remote history of ischemic colitis and now gross bloody stools we will proceed with CT angio for rule out.  Reassessed the patient resting comfortably heart rate has improved.  Consultations Obtained:  N/a    Test Considered:  N/a    Rule out low suspicion for internal head bleed not on anticoag's no new head trauma.  Low suspicion for CVA she has no focal deficit present my exam.  Suspicion for ACS is also low as EKG without signs of ischemia, patient does not endorse any chest pain shortness of breath, he has no cardiac history.  I doubt PE presentation is atypical and understanding chest pain shortness of breath, no recent surgeries long immobilizations there is no unilateral leg swelling or calf tenderness, there is no new oxygen requirements.  He is notably tachycardic but I suspect this could be from infectious etiology as he has bloody stools.  Suspicion for seizures are also low as there is no seizure-like activity no tongue biting he has no history of this.  I doubt patient needs emergent blood transfusion as he is  hemodynamically stable, hemoglobin is stabilized at this time.  Will hold off on antibiotics, as he is afebrile, he does have slight tachycardia and an elevated white count but is unclear source this could be just from dehydration and/or viral diarrheal illness will await for CT imaging.    Dispostion and problem list  Due to shift change patient may handoff to [facility]   Follow-up on CT imaging, treat accordingly, I recommend consulting with GI as feel patient needs to be observed lower GI bleed and possible formal consultation by GI.           Final Clinical Impression(s) / ED Diagnoses Final diagnoses:  Acute GI bleeding    Rx / DC Orders ED Discharge Orders     None         Marcello Fennel, PA-C 11/27/22 1898    Maudie Flakes, MD 11/28/22 [phone]

## 2022-11-27 NOTE — ED Triage Notes (Signed)
Arrives [facility] from home. Pt had an episode of diarrhea last night around 20:00. Pt awoke shortly after 01:00 this morning and had more diarrhea, this time with blood in stool. Nauseated. Had a syncopal episode. Wife caught pt and lowered him to the floor.   Pt takes Plavix. History of GI Bleeding.

## 2022-11-27 NOTE — H&P (Signed)
History and Physical    Patient: Adam Quinn JSR:[id number] DOB: 17-Sep-1956 DOA: 11/27/2022 DOS: the patient was seen and examined on 11/27/2022 PCP: Mckinley Jewel, MD  Patient coming from: Home  Chief Complaint:  Chief Complaint  Patient presents with   GI Bleeding   HPI: Adam Quinn is a 67 y.o. male with medical history significant of DM2 w/ neuropathy, GERD, CVA, HLD. Presenting with bloody stools and syncope. He reports that he was in his normal state of health until last night. He noticed that he was having diarrhea. On his second episode early this morning, he noticed that there was blood in his diarrhea. When he tried to get up, he felt lightheaded/dizzy and passed out. His wife as able to catch him and lower him to the floor. He was out for a few seconds. His family became concerned and brought him to the ED for evaluation. He reports that he's on plavix and he's been taking NSAIDs for the last several days d/t some shoulder pain. He otherwise denies any aggravating or alleviating factors.      Review of Systems: As mentioned in the history of present illness. All other systems reviewed and are negative. Past Medical History:  Diagnosis Date   Allergy    seasonal   Anxiety    Cataract    cataract surgery bilaterally resolved issues   Central scotoma    Chronic kidney disease    small protien showing uses Lisinopril for this   Diverticulitis    Diverticulosis    DM (diabetes mellitus) ([place])    GERD (gastroesophageal reflux disease)    Glaucoma    History of colon polyps    History of hiatal hernia    History of kidney stones    x1   Iron deficiency anemia    Iron infusion 04/29/2019   Neuropathy    OSA on CPAP    Stroke ([place])    Wears glasses    READING   Past Surgical History:  Procedure Laterality Date   cataract surgery     bilateral   COLONOSCOPY     COLONOSCOPY N/A 06/04/2015   Procedure: COLONOSCOPY;  Surgeon: Inda Castle, MD;  Location: [facility]  ENDOSCOPY;  Service: Endoscopy;  Laterality: N/A;   COLONOSCOPY WITH PROPOFOL N/A 05/09/2019   Procedure: COLONOSCOPY WITH PROPOFOL;  Surgeon: Doran Stabler, MD;  Location: [facility] ENDOSCOPY;  Service: Gastroenterology;  Laterality: N/A;   COLONOSCOPY WITH PROPOFOL N/A 01/30/2022   Procedure: COLONOSCOPY WITH PROPOFOL;  Surgeon: Mauri Pole, MD;  Location: [facility] ENDOSCOPY;  Service: Endoscopy;  Laterality: N/A;   ESOPHAGOGASTRODUODENOSCOPY N/A 06/04/2015   Procedure: ESOPHAGOGASTRODUODENOSCOPY (EGD);  Surgeon: Inda Castle, MD;  Location: [facility];  Service: Endoscopy;  Laterality: N/A;   ESOPHAGOGASTRODUODENOSCOPY (EGD) WITH PROPOFOL N/A 08/02/2021   Procedure: ESOPHAGOGASTRODUODENOSCOPY (EGD) WITH PROPOFOL;  Surgeon: Mauri Pole, MD;  Location: [facility] ENDOSCOPY;  Service: Endoscopy;  Laterality: N/A;   POLYPECTOMY  01/30/2022   Procedure: POLYPECTOMY;  Surgeon: Mauri Pole, MD;  Location: [facility] ENDOSCOPY;  Service: Endoscopy;;   UPPER GASTROINTESTINAL ENDOSCOPY     WISDOM TOOTH EXTRACTION     with sedation   Social History:  reports that he quit smoking about 25 years ago. His smoking use included cigarettes. He has never used smokeless tobacco. He reports that he does not drink alcohol and does not use drugs.  Allergies  Allergen Reactions   Aspirin Anaphylaxis and Hives   Shrimp [Shellfish Allergy] Hives  Just shrimp     Family History  Problem Relation Age of Onset   Colon polyps Father    Lung cancer Father    Diabetes Mother    Dementia Mother    Diabetes Sister        x 2   Stroke Maternal Grandmother    Stroke Paternal Grandfather    Colon cancer Neg Hx    Rectal cancer Neg Hx    Stomach cancer Neg Hx    Pancreatic cancer Neg Hx     Prior to Admission medications   Medication Sig Start Date End Date Taking? Authorizing Provider  bimatoprost (LUMIGAN) 0.01 % SOLN Instill 1 drop in both eyes at bedtime 01/09/22     cefdinir (OMNICEF) 300 MG capsule  Take 1 capsule (300 mg total) by mouth 2 (two) times daily. Start 5/3 03/25/22   Samuella Cota, MD  clopidogrel (PLAVIX) 75 MG tablet Take 1 tablet (75 mg total) by mouth daily. 09/01/22   Frann Rider, NP  Continuous Blood Gluc Sensor (FREESTYLE LIBRE 2 SENSOR) MISC Apply sensor to body externally every 2 weeks as directed. To read blood sugar continuously. 11/25/22   Mckinley Jewel, MD  COVID-19 At Home Antigen Test [facility] COVID-19 HOME TEST) KIT Use as directed 11/14/21   Edmon Crape, RPH  dorzolamide (TRUSOPT) 2 % ophthalmic solution Instill 1 drop in both eyes twice a day 01/09/22     dorzolamide (TRUSOPT) 2 % ophthalmic solution Place 1 drop in both eyes twice a day 09/05/22     dorzolamide (TRUSOPT) 2 % ophthalmic solution Place 1 drop into both eyes 2 (two) times daily. 10/23/22     Dulaglutide (TRULICITY) 1.5 QP/5.9FM SOPN Inject under the skin once a week Patient taking differently: Inject 1.5 mg into the skin every Sunday. 09/27/21     Dulaglutide (TRULICITY) 1.5 BW/4.6KZ SOPN Inject 0.5 mg as directed every 7 (seven) days. 10/02/22     ELDERBERRY PO Take 1 tablet by mouth at bedtime. Gummy    [provider]  gabapentin (NEURONTIN) 300 MG capsule Take 1 capsule (300 mg total) by mouth daily. 11/03/22     insulin degludec (TRESIBA FLEXTOUCH) 200 UNIT/ML FlexTouch Pen Inject 50 units under the skin 1 - 2 times per week Patient taking differently: Inject 30 Units into the skin daily as needed (if BG is reading 150 or more). 08/27/21     insulin degludec (TRESIBA FLEXTOUCH) 200 UNIT/ML FlexTouch Pen Inject 50 Units into the skin 1 (one) to 2 (two) times a week. 08/14/22     insulin degludec (TRESIBA FLEXTOUCH) 200 UNIT/ML FlexTouch Pen Inject 50 Units into the skin 1 (one) to 2 (two) times a week. 09/18/22     insulin degludec (TRESIBA FLEXTOUCH) 200 UNIT/ML FlexTouch Pen Inject 30 Units into the skin daily. 09/22/22     Insulin Pen Needle 31G X 5 MM MISC Use as directed with  Tyler Aas 08/27/21     lansoprazole (PREVACID) 30 MG capsule Take 1 capsule (30 mg total) by mouth 2 (two) times daily. 09/04/22     loratadine-pseudoephedrine (ALAVERT D-12 HOUR ALLERGY/CONG) 5-120 MG tablet Take 1 tablet by mouth at bedtime.    [provider]  Melatonin 5 MG CHEW Chew 5 mg by mouth at bedtime.    [provider]  metFORMIN (GLUCOPHAGE-XR) 500 MG 24 hr tablet Take 2 tablets (1,000 mg total) by mouth 2 (two) times daily. 11/18/22     Probiotic Product (RA PROBIOTIC GUMMIES  PO) Take 1 capsule by mouth at bedtime.    [provider]  sertraline (ZOLOFT) 100 MG tablet Take 1 tablet (100 mg total) by mouth daily. 10/28/22     simvastatin (ZOCOR) 20 MG tablet Take 1 tablet by mouth once a day 08/20/22     sodium bicarbonate 650 MG tablet Take 1 tablet by mouth 2 times daily 11/05/22     tamsulosin (FLOMAX) 0.4 MG CAPS capsule Take 1 capsule (0.4 mg total) by mouth at bedtime. 08/07/22       Physical Exam: Vitals:   11/27/22 1103 11/27/22 1200 11/27/22 1230 11/27/22 1351  BP:  137/89 130/76 126/81  Pulse:  (!) 104 (!) 106 (!) 105  Resp:  _0 Temp: 98.2 F (36.8 C)     TempSrc: Oral     SpO2:  98% 98% 97%   General: 67 y.o. male resting in bed in NAD Eyes: PERRL, normal sclera ENMT: Nares patent w/o discharge, orophaynx clear, dentition normal, ears w/o discharge/lesions/ulcers Neck: Supple, trachea midline Cardiovascular: RRR, +S1, S2, no m/g/r, equal pulses throughout Respiratory: CTABL, no w/r/r, normal WOB GI: BS+, NDNT, no masses noted, no organomegaly noted MSK: No e/c/c Neuro: A&O x 3, no focal deficits Psyc: Appropriate interaction and affect, calm/cooperative  Data Reviewed:  Results for orders placed or performed during the hospital encounter of 11/27/22 (from the past 24 hour(s))  POC occult blood, ED Provider will collect     Status: Abnormal   Collection Time: 11/27/22  3:09 AM  Result Value Ref Range   Fecal Occult Bld  POSITIVE (A) NEGATIVE  Comprehensive metabolic panel     Status: Abnormal   Collection Time: 11/27/22  3:44 AM  Result Value Ref Range   Sodium 137 135 - 145 mmol/L   Potassium 4.3 3.5 - 5.1 mmol/L   Chloride 105 98 - 111 mmol/L   CO2 23 22 - 32 mmol/L   Glucose, Bld 297 (H) 70 - 99 mg/dL   BUN 20 8 - 23 mg/dL   Creatinine, Ser 1.30 (H) 0.61 - 1.24 mg/dL   Calcium 9.1 8.9 - 10.3 mg/dL   Total Protein 6.4 (L) 6.5 - 8.1 g/dL   Albumin 3.6 3.5 - 5.0 g/dL   AST 15 15 - 41 U/L   ALT 17 0 - 44 U/L   Alkaline Phosphatase 92 38 - 126 U/L   Total Bilirubin 0.4 0.3 - 1.2 mg/dL   GFR, Estimated 60 (L) >60 mL/min   Anion gap 9 5 - 15  CBC with Differential     Status: Abnormal   Collection Time: 11/27/22  3:44 AM  Result Value Ref Range   WBC 14.5 (H) 4.0 - 10.5 K/uL   RBC 3.68 (L) 4.22 - 5.81 MIL/uL   Hemoglobin 11.7 (L) 13.0 - 17.0 g/dL   HCT 35.3 (L) 39.0 - 52.0 %   MCV 95.9 80.0 - 100.0 fL   MCH 31.8 26.0 - 34.0 pg   MCHC 33.1 30.0 - 36.0 g/dL   RDW 13.0 11.5 - 15.5 %   Platelets 248 150 - 400 K/uL   nRBC 0.0 0.0 - 0.2 %   Neutrophils Relative % 90 %   Neutro Abs 12.9 (H) 1.7 - 7.7 K/uL   Lymphocytes Relative 4 %   Lymphs Abs 0.6 (L) 0.7 - 4.0 K/uL   Monocytes Relative 6 %   Monocytes Absolute 0.8 0.1 - 1.0 K/uL   Eosinophils Relative 0 %   Eosinophils Absolute  0.0 0.0 - 0.5 K/uL   Basophils Relative 0 %   Basophils Absolute 0.1 0.0 - 0.1 K/uL   Immature Granulocytes 0 %   Abs Immature Granulocytes 0.06 0.00 - 0.07 K/uL  Protime-INR     Status: None   Collection Time: 11/27/22  3:44 AM  Result Value Ref Range   Prothrombin Time 14.1 11.4 - 15.2 seconds   INR 1.1 0.8 - 1.2  Lipase, blood     Status: None   Collection Time: 11/27/22  3:44 AM  Result Value Ref Range   Lipase 38 11 - 51 U/L  Type and screen [place]     Status: None   Collection Time: 11/27/22  3:45 AM  Result Value Ref Range   ABO/RH(D) O NEG    Antibody Screen NEG    Sample  Expiration      11/30/2022,2359 Performed at [facility], [place] [address], [place], [place] [address]   CBG monitoring, ED     Status: None   Collection Time: 11/27/22 11:15 AM  Result Value Ref Range   Glucose-Capillary 95 70 - 99 mg/dL  POC CBG, ED     Status: Abnormal   Collection Time: 11/27/22  1:30 PM  Result Value Ref Range   Glucose-Capillary 102 (H) 70 - 99 mg/dL   CTA GIB 1. Diffuse diverticulosis throughout the large bowel. No active diverticulitis identified, and negative for active GI bleeding by CTA. 2. Subcentimeter but progressively calcifying lesion of the pancreatic head/uncinate (series 6, image 70) which was first identified as hypoenhancing in September 2022. Recommend document 10 years of stability by repeat Abdomen CT or MRI every 2 years (pancreatic mass protocol without and with contrast). This recommendation follows ACR consensus guidelines: Management of Incidental Pancreatic Cysts: A White Paper of the ACR Incidental Findings Committee. J Am Coll Radiol 8099;83:382-505. 3. No other acute or inflammatory process identified in the abdomen or pelvis. 4. Calcified coronary artery atherosclerosis.    Assessment and Plan: Syncope     - place in obs, tele     - EKG: sinus, no st elevations     - check echo, orthostatics     - no seizure like activity     - fluids  GIB     - LBGI onboard, appreciate assistance     - continue PPI, fluids     - trend H&H; transfuse for Hgb < 7     - ok for CLD for now  CKD3a     - at baseline     - watch nephrotoxins  DM2 DM neuropathy     - A1c, SSI, glucose checks, CLD  Hx of CVA HLD     - continue home regimen when confirmed     - hold anti-plt d/t bleed  Depression     - continue home regimen when confirmed  GERD     - PPI  Advance Care Planning:   Code Status: FULL  Consults: LBGI  Family Communication: w/ wife at bedside  Severity of Illness: The appropriate patient  status for this patient is OBSERVATION. Observation status is judged to be reasonable and necessary in order to provide the required intensity of service to ensure the patient's safety. The patient's presenting symptoms, physical exam findings, and initial radiographic and laboratory data in the context of their medical condition is felt to place them at decreased risk for further clinical deterioration. Furthermore, it is anticipated that the patient will  be medically stable for discharge from the hospital within 2 midnights of admission.   Author: Jonnie Finner, DO 11/27/2022 2:02 PM  For on call review [email or web address].

## 2022-11-27 NOTE — ED Provider Notes (Signed)
Patient care transferred at shift change from outgoing provider Deno Etienne, PA-C In short, patient is a 67 year old male past medical history of diabetes, CVA on Plavix, chronic anemia receiving iron infusions presents for evaluation of syncope and bloody diarrhea.  Symptoms started last night around 8 PM.  No history of C. difficile, no recent antibiotics.  Patient had a small bowel video capsule on 04/04/2022 negative for source of GI blood loss.  Colonoscopy showed evidence of diverticula.   Physical Exam  BP 123/74   Pulse (!) 103   Temp (!) 97.5 F (36.4 C) (Oral)   Resp 13   SpO2 97%   Physical Exam Vitals and nursing note reviewed.  Constitutional:      Appearance: Normal appearance.  HENT:     Head: Normocephalic and atraumatic.     Mouth/Throat:     Mouth: Mucous membranes are moist.  Eyes:     General: No scleral icterus. Cardiovascular:     Rate and Rhythm: Normal rate and regular rhythm.     Pulses: Normal pulses.     Heart sounds: Normal heart sounds.  Pulmonary:     Effort: Pulmonary effort is normal.     Breath sounds: Normal breath sounds.  Abdominal:     General: Abdomen is flat. There is distension.     Palpations: Abdomen is soft.     Tenderness: There is no abdominal tenderness.  Musculoskeletal:        General: No deformity.  Skin:    General: Skin is warm.     Findings: No rash.  Neurological:     General: No focal deficit present.     Mental Status: He is alert.  Psychiatric:        Mood and Affect: Mood normal.     Procedures  Procedures  ED Course / MDM    Medical Decision Making Amount and/or Complexity of Data Reviewed Labs: ordered. Radiology: ordered.  Risk Prescription drug management. Decision regarding hospitalization.   On physical examination, the abdomen is distended but no tenderness to palpation.  CBC with leukocytosis of 14.5, normocytic anemia at 11.7. CMP shows glucose of 297 creatinine 1.3, prothrombin time INR  unremarkable. CT angio showed diverticulosis with no evidence of diverticulitis. I spoke to Tye Savoy NP from [place] who recommended clear liquid diets and admission for observation and further evaluation. Of note this is suspected to be diverticular bleed however can not rule out upper Gi bleed. I spoke to Dr. Marylyn Ishihara hospitalist who agreed to admit the patient to the hospital for further evaluation and management.        Rex Kras, PA 11/27/22 1757    Elgie Congo, MD 11/27/22 [phone]

## 2022-11-28 ENCOUNTER — Telehealth: Payer: Self-pay

## 2022-11-28 DIAGNOSIS — R55 Syncope and collapse: Secondary | ICD-10-CM | POA: Diagnosis not present

## 2022-11-28 DIAGNOSIS — N1831 Chronic kidney disease, stage 3a: Secondary | ICD-10-CM | POA: Diagnosis not present

## 2022-11-28 DIAGNOSIS — K5791 Diverticulosis of intestine, part unspecified, without perforation or abscess with bleeding: Secondary | ICD-10-CM | POA: Diagnosis not present

## 2022-11-28 LAB — COMPREHENSIVE METABOLIC PANEL
ALT: 15 U/L (ref 0–44)
AST: 14 U/L — ABNORMAL LOW (ref 15–41)
Albumin: 3.1 g/dL — ABNORMAL LOW (ref 3.5–5.0)
Alkaline Phosphatase: 71 U/L (ref 38–126)
Anion gap: 7 (ref 5–15)
BUN: 16 mg/dL (ref 8–23)
CO2: 24 mmol/L (ref 22–32)
Calcium: 8.9 mg/dL (ref 8.9–10.3)
Chloride: 108 mmol/L (ref 98–111)
Creatinine, Ser: 1.3 mg/dL — ABNORMAL HIGH (ref 0.61–1.24)
GFR, Estimated: 60 mL/min — ABNORMAL LOW (ref >60)
Glucose, Bld: 118 mg/dL — ABNORMAL HIGH (ref 70–99)
Potassium: 4.2 mmol/L (ref 3.5–5.1)
Sodium: 139 mmol/L (ref 135–145)
Total Bilirubin: 0.3 mg/dL (ref 0.3–1.2)
Total Protein: 5.7 g/dL — ABNORMAL LOW (ref 6.5–8.1)

## 2022-11-28 LAB — GLUCOSE, CAPILLARY
Glucose-Capillary: 103 mg/dL — ABNORMAL HIGH (ref 70–99)
Glucose-Capillary: 103 mg/dL — ABNORMAL HIGH (ref 70–99)
Glucose-Capillary: 147 mg/dL — ABNORMAL HIGH (ref 70–99)
Glucose-Capillary: 123 mg/dL — ABNORMAL HIGH (ref 70–99)

## 2022-11-28 LAB — CBC
HCT: 30.1 % — ABNORMAL LOW (ref 39.0–52.0)
Hemoglobin: 9.9 g/dL — ABNORMAL LOW (ref 13.0–17.0)
MCH: 32.4 pg (ref 26.0–34.0)
MCHC: 32.9 g/dL (ref 30.0–36.0)
MCV: 98.4 fL (ref 80.0–100.0)
Platelets: 220 10*3/uL (ref 150–400)
RBC: 3.06 MIL/uL — ABNORMAL LOW (ref 4.22–5.81)
RDW: 13.4 % (ref 11.5–15.5)
WBC: 6.4 10*3/uL (ref 4.0–10.5)
nRBC: 0 % (ref 0.0–0.2)

## 2022-11-28 LAB — HEMOGLOBIN A1C
Hgb A1c MFr Bld: 6.4 % — ABNORMAL HIGH (ref 4.8–5.6)
Mean Plasma Glucose: 137 mg/dL

## 2022-11-28 MED ORDER — CLOPIDOGREL BISULFATE 75 MG PO TABS: 75.0000 mg | ORAL_TABLET | Freq: Every day | ORAL | Status: DC

## 2022-11-28 NOTE — Discharge Summary (Signed)
Physician Discharge Summary   Patient: Adam Quinn MRN: [id number] DOB: 04/17/1956  Admit date:     11/27/2022  Discharge date: 11/28/22  Discharge Physician: Murray Hodgkins   PCP: Mckinley Jewel, MD   Recommendations at discharge:   GIB Pancreatic lesion  Discharge Diagnoses: Principal Problem:   Syncope Active Problems:   GIB (gastrointestinal bleeding)   DM2 (diabetes mellitus, type 2) ([place])   GERD without esophagitis   Diabetic polyneuropathy associated with type 2 diabetes mellitus ([place])   History of CVA (cerebrovascular accident)   Depression   Hyperlipemia   CKD stage IIIa ([place])  Resolved Problems:   * No resolved hospital problems. *  Hospital Course: 67 year old man PMH including diabetes, stroke presenting with bloody stools and syncope.  Syncope occurred immediately upon standing at home in the context of diarrhea and vomiting.  He has a history of GI bleed in the past and has undergone EGD, colonoscopy and small bowel video capsule which was unrevealing.  Diverticular bleeding has been suspected.  He was admitted for recurrent bleeding on Plavix.  Thought to be diverticular in nature.  He was by gastroenterology and conservative management was recommended.  Hemoglobin remained stable and he was cleared for discharge with recommendation to hold Plavix until Monday.  Vasovagal syncope. By history vasovagal in nature in the context of bleeding, nausea and vomiting.  2D echocardiogram reassuring.  No further evaluation suggested.  Lower GI bleed History of severe diverticular disease and previous workup including EGD colonoscopy and small bowel video capsule. Seen by GI, conservative management recommended.  Hemoglobin stabilized. Hold Plavix for 72 hours. Has follow up with GI 1/25 at 3 pm. Will repeat cbc, iron studies then  Pancreatic calcification lesion  Will need a pancreas protocol CT abdomen versus MRI/MRCP in 1 year to evaluate the pancreatic  calcification lesion in the head/uncinate.  GI will coordinate this.   CKD IIIa Stable.   DM type 2 DM neuropathy stable   PMH CVA Resume Plavix in 72 hours         Consultants:  GI  Procedures performed:  None   Disposition: Home Diet recommendation:  Discharge Diet Orders (From admission, onward)     Start     Ordered   11/28/22 0000  Diet - low sodium heart healthy        11/28/22 1604   11/28/22 0000  Diet Carb Modified        11/28/22 1604           Cardiac and Carb modified diet DISCHARGE MEDICATION: Allergies as of 11/28/2022       Reactions   Aspirin Anaphylaxis, Hives   Shrimp [shellfish Allergy] Hives   Just shrimp        Medication List     TAKE these medications    Alavert D-12 Hour Allergy/Cong 5-120 MG tablet Generic drug: loratadine-pseudoephedrine Take 1 tablet by mouth at bedtime.   Carestart COVID-19 Home Test Kit Generic drug: COVID-19 At Home Antigen Test Use as directed   clopidogrel 75 MG tablet Commonly known as: PLAVIX Take 1 tablet (75 mg total) by mouth daily. Resume 1/8 What changed: additional instructions   dorzolamide 2 % ophthalmic solution Commonly known as: TRUSOPT Place 1 drop in both eyes twice a day What changed: Another medication with the same name was removed. Continue taking this medication, and follow the directions you see here.   ELDERBERRY PO Take 1 tablet by mouth at bedtime. Gummy   FIBER  PO Take 1 tablet by mouth daily. Miralax brand fiber gummy.   FreeStyle Libre 2 Sensor Misc Apply sensor to body externally every 2 weeks as directed. To read blood sugar continuously.   gabapentin 300 MG capsule Commonly known as: NEURONTIN Take 1 capsule (300 mg total) by mouth daily.   lansoprazole 30 MG capsule Commonly known as: PREVACID Take 1 capsule (30 mg total) by mouth 2 (two) times daily.   Lumigan 0.01 % Soln Generic drug: bimatoprost Instill 1 drop in both eyes at bedtime   Melatonin  5 MG Chew Chew 5 mg by mouth at bedtime.   metFORMIN 500 MG 24 hr tablet Commonly known as: GLUCOPHAGE-XR Take 2 tablets (1,000 mg total) by mouth 2 (two) times daily.   Pentips 31G X 5 MM Misc Generic drug: Insulin Pen Needle Use as directed with Tyler Aas   RA PROBIOTIC GUMMIES PO Take 1 capsule by mouth at bedtime.   sertraline 100 MG tablet Commonly known as: ZOLOFT Take 1 tablet (100 mg total) by mouth daily.   simvastatin 20 MG tablet Commonly known as: ZOCOR Take 1 tablet by mouth once a day   sodium bicarbonate 650 MG tablet Take 1 tablet by mouth 2 times daily   tamsulosin 0.4 MG Caps capsule Commonly known as: FLOMAX Take 1 capsule (0.4 mg total) by mouth at bedtime.   Tyler Aas FlexTouch 200 UNIT/ML FlexTouch Pen Generic drug: insulin degludec Inject 30 Units into the skin daily. What changed:  when to take this additional instructions Another medication with the same name was removed. Continue taking this medication, and follow the directions you see here.   Trulicity 1.5 ER/1.5QM Sopn Generic drug: Dulaglutide Inject under the skin once a week What changed:  how much to take how to take this when to take this        Follow-up Information     Willia Craze, NP Follow up on 12/18/2022.   Specialty: Gastroenterology Why: at 3:00 pm Contact information: [place] [id number] [phone]                Feels better  Discharge Exam: Filed Weights   11/27/22 1930 11/28/22 1229  Weight: 81.3 kg 80.2 kg   Physical Exam Vitals reviewed.  Constitutional:      General: He is not in acute distress.    Appearance: He is not ill-appearing or toxic-appearing.  Cardiovascular:     Rate and Rhythm: Normal rate and regular rhythm.     Heart sounds: No murmur heard. Pulmonary:     Effort: Pulmonary effort is normal. No respiratory distress.     Breath sounds: No wheezing, rhonchi or rales.  Neurological:     Mental Status:  He is alert.  Psychiatric:        Mood and Affect: Mood normal.        Behavior: Behavior normal.      CBG stable Creatinine 1.30, stable, appears to be at baseline, CKD stage IIIa Hgb stable 10.5, fecal occult blood positive Echo normal LVEF, grade I diastolic dysfunction, unremarkable valves EKG ST CTA angio GI bleed noted  Condition at discharge: good  The results of significant diagnostics from this hospitalization (including imaging, microbiology, ancillary and laboratory) are listed below for reference.   Imaging Studies: ECHOCARDIOGRAM COMPLETE  Result Date: 11/27/2022    ECHOCARDIOGRAM REPORT   Patient Name:   JAVI BOLLMAN Date of Exam: 11/27/2022 Medical Rec #:  [id number]  Height:       68.0 in Accession #:    [id number]      Weight:       177.6 lb Date of Birth:  1956-01-27      BSA:          1.943 m Patient Age:    26 years        BP:           127/79 mmHg Patient Gender: M               HR:           109 bpm. Exam Location:  Inpatient Procedure: 2D Echo, Cardiac Doppler, Color Doppler and Intracardiac            Opacification Agent Indications:    Syncope R55  History:        Patient has no prior history of Echocardiogram examinations.                 Stroke; Risk Factors:Diabetes and Sleep Apnea. Chronic kidney                 disease.  Sonographer:    Darlina Sicilian RDCS Referring Phys: [id number] [place]  1. Left ventricular ejection fraction, by estimation, is 65 to 70%. The left ventricle has normal function. The left ventricle has no regional wall motion abnormalities. There is mild left ventricular hypertrophy. Left ventricular diastolic parameters are consistent with Grade I diastolic dysfunction (impaired relaxation).  2. Right ventricular systolic function is normal. The right ventricular size is normal. Tricuspid regurgitation signal is inadequate for assessing PA pressure.  3. The mitral valve is normal in structure. No evidence of mitral valve  regurgitation. No evidence of mitral stenosis.  4. The aortic valve is tricuspid. Aortic valve regurgitation is not visualized. No aortic stenosis is present.  5. Aortic dilatation noted. There is mild dilatation of the aortic root, measuring 40 mm.  6. The inferior vena cava is normal in size with greater than 50% respiratory variability, suggesting right atrial pressure of 3 mmHg. FINDINGS  Left Ventricle: Left ventricular ejection fraction, by estimation, is 65 to 70%. The left ventricle has normal function. The left ventricle has no regional wall motion abnormalities. Definity contrast agent was given IV to delineate the left ventricular  endocardial borders. The left ventricular internal cavity size was normal in size. There is mild left ventricular hypertrophy. Left ventricular diastolic parameters are consistent with Grade I diastolic dysfunction (impaired relaxation). Right Ventricle: The right ventricular size is normal. No increase in right ventricular wall thickness. Right ventricular systolic function is normal. Tricuspid regurgitation signal is inadequate for assessing PA pressure. Left Atrium: Left atrial size was normal in size. Right Atrium: Right atrial size was not well visualized. Pericardium: There is no evidence of pericardial effusion. Presence of epicardial fat layer. Mitral Valve: The mitral valve is normal in structure. No evidence of mitral valve regurgitation. No evidence of mitral valve stenosis. Tricuspid Valve: The tricuspid valve is grossly normal. Tricuspid valve regurgitation is not demonstrated. No evidence of tricuspid stenosis. Aortic Valve: The aortic valve is tricuspid. Aortic valve regurgitation is not visualized. No aortic stenosis is present. Pulmonic Valve: The pulmonic valve was not well visualized. Pulmonic valve regurgitation is not visualized. No evidence of pulmonic stenosis. Aorta: Aortic dilatation noted. There is mild dilatation of the aortic root, measuring 40 mm.  Venous: The inferior vena cava is normal in size with greater than 50%  respiratory variability, suggesting right atrial pressure of 3 mmHg. IAS/Shunts: The interatrial septum was not well visualized.  LEFT VENTRICLE PLAX 2D LVIDd:         3.90 cm     Diastology LVIDs:         2.40 cm     LV e' lateral:   5.22 cm/s LV PW:         1.30 cm     LV E/e' lateral: 12.6 LV IVS:        1.30 cm LVOT diam:     1.90 cm LV SV:         49 LV SV Index:   25 LVOT Area:     2.84 cm  LV Volumes (MOD) LV vol d, MOD A2C: 67.0 ml LV vol d, MOD A4C: 76.2 ml LV vol s, MOD A2C: 20.9 ml LV vol s, MOD A4C: 25.2 ml LV SV MOD A2C:     46.1 ml LV SV MOD A4C:     76.2 ml LV SV MOD BP:      49.7 ml RIGHT VENTRICLE RV S prime:     17.40 cm/s TAPSE (M-mode): 1.4 cm LEFT ATRIUM           Index        RIGHT ATRIUM          Index LA diam:      3.50 cm 1.80 cm/m   RA Area:     7.95 cm LA Vol (A4C): 26.3 ml 13.53 ml/m  RA Volume:   11.40 ml 5.87 ml/m  AORTIC VALVE LVOT Vmax:   88.60 cm/s LVOT Vmean:  69.500 cm/s LVOT VTI:    0.174 m  AORTA Ao Root diam: 4.05 cm Ao Asc diam:  3.20 cm MITRAL VALVE MV Area (PHT): 3.76 cm    SHUNTS MV Decel Time: 202 msec    Systemic VTI:  0.17 m MV E velocity: 65.60 cm/s  Systemic Diam: 1.90 cm MV A velocity: 74.10 cm/s MV E/A ratio:  0.89 Oswaldo Milian MD Electronically signed by Oswaldo Milian MD Signature Date/Time: 11/27/2022/3:57:21 PM    Final    CT ANGIO GI BLEED  Result Date: 11/27/2022 CLINICAL DATA:  67 year old male with 1 episode of bloody diarrhea. Positive fecal occult blood test. EXAM: CTA ABDOMEN AND PELVIS WITHOUT AND WITH CONTRAST TECHNIQUE: Multidetector CT imaging of the abdomen and pelvis was performed using the standard protocol during bolus administration of intravenous contrast. Multiplanar reconstructed images and MIPs were obtained and reviewed to evaluate the vascular anatomy. RADIATION DOSE REDUCTION: This exam was performed according to the departmental dose-optimization  program which includes automated exposure control, adjustment of the mA and/or kV according to patient size and/or use of iterative reconstruction technique. CONTRAST:  115m OMNIPAQUE IOHEXOL 350 MG/ML SOLN COMPARISON:  Prior CTA abdomen and pelvis 03/23/2022 and earlier. FINDINGS: VASCULAR Normal caliber abdominal aorta. Minimal calcified atherosclerosis for age. Major aortic branches are patent including the celiac axis, SMA, IMA. Negative for aortic aneurysm or dissection. Unremarkable for age visible iliofemoral arteries. On the delayed phase the portal venous system is patent. Review of the MIP images confirms the above findings. NON-VASCULAR Lower chest: Multilobar left lower lung opacity seen last year has cleared. Residual lung base atelectasis. Calcified coronary artery atherosclerosis. No pericardial or pleural effusion. Hepatobiliary: Stable, negative liver and gallbladder. Pancreas: Small dystrophic calcification of the pancreatic head/uncinate has increased since last year on series 3, image 327 This corresponds to a subcentimeter hypodense lesion  in 2022. But no progressive pancreatic mass or inflammation. No ductal dilatation. Spleen: Negative. Adrenals/Urinary Tract: Stable and negative. Decompressed ureters. Distended but otherwise negative bladder. Incidental pelvic phleboliths. Stomach/Bowel: Similar widespread large bowel retained stool. Redundant sigmoid colon and bilateral flexures with diffuse diverticulosis throughout the large bowel. Normal appendix on series 12, image 54. Comparing pre contrast and postcontrast images, no large bowel contrast extravasation is identified. Occasional hyperdense material retained within the large bowel diverticula. Negative terminal ileum. No dilated small bowel. Decompressed stomach and duodenum. No free air or free fluid. No mesenteric inflammation identified. Lymphatic: No lymphadenopathy identified. Reproductive: Negative. Other: No pelvic free fluid.  Musculoskeletal: No acute osseous abnormality identified. Mild ventral right abdominal wall ground-glass opacity on series 12, image 73 likely related to subcutaneous injection there. IMPRESSION: 1. Diffuse diverticulosis throughout the large bowel. No active diverticulitis identified, and negative for active GI bleeding by CTA. 2. Subcentimeter but progressively calcifying lesion of the pancreatic head/uncinate (series 6, image 51) which was first identified as hypoenhancing in September 2022. Recommend document 10 years of stability by repeat Abdomen CT or MRI every 2 years (pancreatic mass protocol without and with contrast). This recommendation follows ACR consensus guidelines: Management of Incidental Pancreatic Cysts: A White Paper of the ACR Incidental Findings Committee. J Am Coll Radiol 8341;96:222-979. 3. No other acute or inflammatory process identified in the abdomen or pelvis. 4. Calcified coronary artery atherosclerosis. Electronically Signed   By: Genevie Ann M.D.   On: 11/27/2022 07:02    Microbiology: Results for orders placed or performed during the hospital encounter of 03/23/22  Resp Panel by RT-PCR (Flu A&B, Covid) Nasopharyngeal Swab     Status: None   Collection Time: 03/23/22  9:13 AM   Specimen: Nasopharyngeal Swab; Nasopharyngeal(NP) swabs in vial transport medium  Result Value Ref Range Status   SARS Coronavirus 2 by RT PCR NEGATIVE NEGATIVE Final    Comment: (NOTE) SARS-CoV-2 target nucleic acids are NOT DETECTED.  The SARS-CoV-2 RNA is generally detectable in upper respiratory specimens during the acute phase of infection. The lowest concentration of SARS-CoV-2 viral copies this assay can detect is 138 copies/mL. A negative result does not preclude SARS-Cov-2 infection and should not be used as the sole basis for treatment or other patient management decisions. A negative result may occur with  improper specimen collection/handling, submission of specimen other than  nasopharyngeal swab, presence of viral mutation(s) within the areas targeted by this assay, and inadequate number of viral copies(<138 copies/mL). A negative result must be combined with clinical observations, patient history, and epidemiological information. The expected result is Negative.  Fact Sheet for Patients:  EntrepreneurPulse.com.au  Fact Sheet for Healthcare Providers:  IncredibleEmployment.be  This test is no t yet approved or cleared by the [place] FDA and  has been authorized for detection and/or diagnosis of SARS-CoV-2 by FDA under an Emergency Use Authorization (EUA). This EUA will remain  in effect (meaning this test can be used) for the duration of the COVID-19 declaration under Section 564(b)(1) of the Act, 21 U.S.C.section 360bbb-3(b)(1), unless the authorization is terminated  or revoked sooner.       Influenza A by PCR NEGATIVE NEGATIVE Final   Influenza B by PCR NEGATIVE NEGATIVE Final    Comment: (NOTE) The Xpert Xpress SARS-CoV-2/FLU/RSV plus assay is intended as an aid in the diagnosis of influenza from Nasopharyngeal swab specimens and should not be used as a sole basis for treatment. Nasal washings and aspirates are unacceptable for Xpert  Xpress SARS-CoV-2/FLU/RSV testing.  Fact Sheet for Patients: EntrepreneurPulse.com.au  Fact Sheet for Healthcare Providers: IncredibleEmployment.be  This test is not yet approved or cleared by the [place] FDA and has been authorized for detection and/or diagnosis of SARS-CoV-2 by FDA under an Emergency Use Authorization (EUA). This EUA will remain in effect (meaning this test can be used) for the duration of the COVID-19 declaration under Section 564(b)(1) of the Act, 21 U.S.C. section 360bbb-3(b)(1), unless the authorization is terminated or revoked.  Performed at [facility], [place] [address] [place], [place] [address]     Labs: CBC: Recent Labs  Lab 11/27/22 0344 11/27/22 1630 11/27/22 2143 11/28/22 0528  WBC 14.5*  --   --  6.4  NEUTROABS 12.9*  --   --   --   HGB 11.7* 9.9* 10.5* 9.9*  HCT 35.3* 29.8* 31.2* 30.1*  MCV 95.9  --   --  98.4  PLT 248  --   --  277   Basic Metabolic Panel: Recent Labs  Lab 11/27/22 0344 11/28/22 0528  NA 137 139  K 4.3 4.2  CL 105 108  CO2 23 24  GLUCOSE 297* 118*  BUN 20 16  CREATININE 1.30* 1.30*  CALCIUM 9.1 8.9   Liver Function Tests: Recent Labs  Lab 11/27/22 0344 11/28/22 0528  AST 15 14*  ALT 17 15  ALKPHOS 92 71  BILITOT 0.4 0.3  PROT 6.4* 5.7*  ALBUMIN 3.6 3.1*   CBG: Recent Labs  Lab 11/27/22 1736 11/27/22 2133 11/28/22 0743 11/28/22 1213 11/28/22 1619  GLUCAP 107* 103* 103* 147* 123*    Discharge time spent: greater than 30 minutes.  Signed: Murray Hodgkins, MD [facility] 11/28/2022

## 2022-11-28 NOTE — Hospital Course (Addendum)
67 year old man PMH including diabetes, stroke presenting with bloody stools and syncope.  Syncope occurred immediately upon standing at home in the context of diarrhea and vomiting.  He has a history of GI bleed in the past and has undergone EGD, colonoscopy and small bowel video capsule which was unrevealing.  Diverticular bleeding has been suspected.  He was admitted for recurrent bleeding on Plavix.  Thought to be diverticular in nature.

## 2022-11-28 NOTE — Telephone Encounter (Signed)
-----   Message from Willia Craze, NP sent at 11/28/2022 11:11 AM EST ----- Mickel Baas, please put this patient on a one year recall list for pancreas imaging ( CT vr MRCP). Dr. Silverio Decamp can decide which test when the time come. Thanks

## 2022-11-28 NOTE — Progress Notes (Signed)
Daily Progress Note  Hospital Day: 2  Chief Complaint: GI bleed  Assessment   Patient profile:  JAYSEN WEY is a 67 y.o. male with a pmh of diverticulosis, diverticulitis, DM2, CVA on plavix, OSA on CPAP, and chronic iron deficiency anemia followed by Hematology. Admitted 1/4 with recurrent N/V/D/ syncope and GI bleed  # 67 yo male with N/V/D / maroon stool on plavix / suncopal episode. Has history of GI bleeding presumed to be diverticular. Current bleed may also be diverticular though doesn't explain associated N/V.  CTA negative for active bleeding.  Today: Hgb 9.9 , down from 13.9 a several days ago.  He is normotensive but still mildly tachycardic. No bms or bleeding in > 24 hours.  No further syncopal episodes.   # Leukocytosis, WBC 14.5K on admission. Reactive ? Gastroenteritis with the N/V and D?     # Chronic IDA. Followed by Hematology, gets IV iron, last dose 09/24/22.    # History of CVA on plavix at home. Last dose was 1/3   # Subcentimeter but progressively calcifying lesion of the pancreatic head/uncinate on CT scan   # [id number]    Plan:    Will advance to carb mod diet now.  Okay to go home today from (GI standpoint) though per H+P an echo was to be done  Please hold plavix for 72 hours ( resume Monday) Follow up with me 1/25 at 3 pm. Will repeat cbc, iron studies then Will need a pancreas protocol CT abdomen versus MRI/MRCP in 1 year to evaluate the pancreatic calcification lesion in the head/uncinate.   Subjective    Feels okay. No bleeding in over 24 hours.   Objective   Imaging:  ECHOCARDIOGRAM COMPLETE  Result Date: 11/27/2022    ECHOCARDIOGRAM REPORT   Patient Name:   EINAR NOLASCO Date of Exam: 11/27/2022 Medical Rec #:  [id number]       Height:       68.0 in Accession #:    [id number]      Weight:       177.6 lb Date of Birth:  [birth year]      BSA:          1.943 m Patient Age:    67 years        BP:           127/79 mmHg Patient Gender: M                HR:           109 bpm. Exam Location:  Inpatient Procedure: 2D Echo, Cardiac Doppler, Color Doppler and Intracardiac            Opacification Agent Indications:    Syncope R55  History:        Patient has no prior history of Echocardiogram examinations.                 Stroke; Risk Factors:Diabetes and Sleep Apnea. Chronic kidney                 disease.  Sonographer:    Darlina Sicilian RDCS Referring Phys: [id number] [place]  1. Left ventricular ejection fraction, by estimation, is 65 to 70%. The left ventricle has normal function. The left ventricle has no regional wall motion abnormalities. There is mild left ventricular hypertrophy. Left ventricular diastolic parameters are consistent with Grade I diastolic dysfunction (impaired relaxation).  2. Right ventricular systolic function is  normal. The right ventricular size is normal. Tricuspid regurgitation signal is inadequate for assessing PA pressure.  3. The mitral valve is normal in structure. No evidence of mitral valve regurgitation. No evidence of mitral stenosis.  4. The aortic valve is tricuspid. Aortic valve regurgitation is not visualized. No aortic stenosis is present.  5. Aortic dilatation noted. There is mild dilatation of the aortic root, measuring 40 mm.  6. The inferior vena cava is normal in size with greater than 50% respiratory variability, suggesting right atrial pressure of 3 mmHg. FINDINGS  Left Ventricle: Left ventricular ejection fraction, by estimation, is 65 to 70%. The left ventricle has normal function. The left ventricle has no regional wall motion abnormalities. Definity contrast agent was given IV to delineate the left ventricular  endocardial borders. The left ventricular internal cavity size was normal in size. There is mild left ventricular hypertrophy. Left ventricular diastolic parameters are consistent with Grade I diastolic dysfunction (impaired relaxation). Right Ventricle: The right ventricular size is  normal. No increase in right ventricular wall thickness. Right ventricular systolic function is normal. Tricuspid regurgitation signal is inadequate for assessing PA pressure. Left Atrium: Left atrial size was normal in size. Right Atrium: Right atrial size was not well visualized. Pericardium: There is no evidence of pericardial effusion. Presence of epicardial fat layer. Mitral Valve: The mitral valve is normal in structure. No evidence of mitral valve regurgitation. No evidence of mitral valve stenosis. Tricuspid Valve: The tricuspid valve is grossly normal. Tricuspid valve regurgitation is not demonstrated. No evidence of tricuspid stenosis. Aortic Valve: The aortic valve is tricuspid. Aortic valve regurgitation is not visualized. No aortic stenosis is present. Pulmonic Valve: The pulmonic valve was not well visualized. Pulmonic valve regurgitation is not visualized. No evidence of pulmonic stenosis. Aorta: Aortic dilatation noted. There is mild dilatation of the aortic root, measuring 40 mm. Venous: The inferior vena cava is normal in size with greater than 50% respiratory variability, suggesting right atrial pressure of 3 mmHg. IAS/Shunts: The interatrial septum was not well visualized.  LEFT VENTRICLE PLAX 2D LVIDd:         3.90 cm     Diastology LVIDs:         2.40 cm     LV e' lateral:   5.22 cm/s LV PW:         1.30 cm     LV E/e' lateral: 12.6 LV IVS:        1.30 cm LVOT diam:     1.90 cm LV SV:         49 LV SV Index:   25 LVOT Area:     2.84 cm  LV Volumes (MOD) LV vol d, MOD A2C: 67.0 ml LV vol d, MOD A4C: 76.2 ml LV vol s, MOD A2C: 20.9 ml LV vol s, MOD A4C: 25.2 ml LV SV MOD A2C:     46.1 ml LV SV MOD A4C:     76.2 ml LV SV MOD BP:      49.7 ml RIGHT VENTRICLE RV S prime:     17.40 cm/s TAPSE (M-mode): 1.4 cm LEFT ATRIUM           Index        RIGHT ATRIUM          Index LA diam:      3.50 cm 1.80 cm/m   RA Area:     7.95 cm LA Vol (A4C): 26.3 ml 13.53 ml/m  RA Volume:  11.40 ml 5.87 ml/m   AORTIC VALVE LVOT Vmax:   88.60 cm/s LVOT Vmean:  69.500 cm/s LVOT VTI:    0.174 m  AORTA Ao Root diam: 4.05 cm Ao Asc diam:  3.20 cm MITRAL VALVE MV Area (PHT): 3.76 cm    SHUNTS MV Decel Time: 202 msec    Systemic VTI:  0.17 m MV E velocity: 65.60 cm/s  Systemic Diam: 1.90 cm MV A velocity: 74.10 cm/s MV E/A ratio:  0.89 Oswaldo Milian MD Electronically signed by Oswaldo Milian MD Signature Date/Time: 11/27/2022/3:57:21 PM    Final    CT ANGIO GI BLEED  Result Date: 11/27/2022 CLINICAL DATA:  67 year old male with 1 episode of bloody diarrhea. Positive fecal occult blood test. EXAM: CTA ABDOMEN AND PELVIS WITHOUT AND WITH CONTRAST TECHNIQUE: Multidetector CT imaging of the abdomen and pelvis was performed using the standard protocol during bolus administration of intravenous contrast. Multiplanar reconstructed images and MIPs were obtained and reviewed to evaluate the vascular anatomy. RADIATION DOSE REDUCTION: This exam was performed according to the departmental dose-optimization program which includes automated exposure control, adjustment of the mA and/or kV according to patient size and/or use of iterative reconstruction technique. CONTRAST:  193m OMNIPAQUE IOHEXOL 350 MG/ML SOLN COMPARISON:  Prior CTA abdomen and pelvis 03/23/2022 and earlier. FINDINGS: VASCULAR Normal caliber abdominal aorta. Minimal calcified atherosclerosis for age. Major aortic branches are patent including the celiac axis, SMA, IMA. Negative for aortic aneurysm or dissection. Unremarkable for age visible iliofemoral arteries. On the delayed phase the portal venous system is patent. Review of the MIP images confirms the above findings. NON-VASCULAR Lower chest: Multilobar left lower lung opacity seen last year has cleared. Residual lung base atelectasis. Calcified coronary artery atherosclerosis. No pericardial or pleural effusion. Hepatobiliary: Stable, negative liver and gallbladder. Pancreas: Small dystrophic  calcification of the pancreatic head/uncinate has increased since last year on series 3, image 371 This corresponds to a subcentimeter hypodense lesion in 2022. But no progressive pancreatic mass or inflammation. No ductal dilatation. Spleen: Negative. Adrenals/Urinary Tract: Stable and negative. Decompressed ureters. Distended but otherwise negative bladder. Incidental pelvic phleboliths. Stomach/Bowel: Similar widespread large bowel retained stool. Redundant sigmoid colon and bilateral flexures with diffuse diverticulosis throughout the large bowel. Normal appendix on series 12, image 54. Comparing pre contrast and postcontrast images, no large bowel contrast extravasation is identified. Occasional hyperdense material retained within the large bowel diverticula. Negative terminal ileum. No dilated small bowel. Decompressed stomach and duodenum. No free air or free fluid. No mesenteric inflammation identified. Lymphatic: No lymphadenopathy identified. Reproductive: Negative. Other: No pelvic free fluid. Musculoskeletal: No acute osseous abnormality identified. Mild ventral right abdominal wall ground-glass opacity on series 12, image 73 likely related to subcutaneous injection there. IMPRESSION: 1. Diffuse diverticulosis throughout the large bowel. No active diverticulitis identified, and negative for active GI bleeding by CTA. 2. Subcentimeter but progressively calcifying lesion of the pancreatic head/uncinate (series 6, image 850 which was first identified as hypoenhancing in September 2022. Recommend document 10 years of stability by repeat Abdomen CT or MRI every 2 years (pancreatic mass protocol without and with contrast). This recommendation follows ACR consensus guidelines: Management of Incidental Pancreatic Cysts: A White Paper of the ACR Incidental Findings Committee. J Am Coll Radiol 23557;32:202-542 3. No other acute or inflammatory process identified in the abdomen or pelvis. 4. Calcified coronary  artery atherosclerosis. Electronically Signed   By: HGenevie AnnM.D.   On: 11/27/2022 07:02    Lab Results: Recent Labs  11/27/22 0344 11/27/22 1630 11/27/22 2143 11/28/22 0528  WBC 14.5*  --   --  6.4  HGB 11.7* 9.9* 10.5* 9.9*  HCT 35.3* 29.8* 31.2* 30.1*  PLT 248  --   --  220   BMET Recent Labs    11/27/22 0344 11/28/22 0528  NA 137 139  K 4.3 4.2  CL 105 108  CO2 23 24  GLUCOSE 297* 118*  BUN 20 16  CREATININE 1.30* 1.30*  CALCIUM 9.1 8.9   LFT Recent Labs    11/28/22 0528  PROT 5.7*  ALBUMIN 3.1*  AST 14*  ALT 15  ALKPHOS 71  BILITOT 0.3   PT/INR Recent Labs    11/27/22 0344  LABPROT 14.1  INR 1.1     Scheduled inpatient medications:   gabapentin  300 mg Oral Daily   insulin aspart  0-5 Units Subcutaneous QHS   insulin aspart  0-9 Units Subcutaneous TID WC   melatonin  5 mg Oral QHS   pantoprazole (PROTONIX) IV  40 mg Intravenous Q24H   sertraline  100 mg Oral Daily   simvastatin  20 mg Oral q1800   sodium bicarbonate  650 mg Oral BID   sodium chloride flush  3 mL Intravenous Q12H   tamsulosin  0.4 mg Oral QHS   Continuous inpatient infusions:   sodium chloride 100 mL/hr at 11/28/22 0700   PRN inpatient medications:   Vital signs in last 24 hours: Temp:  [97.7 F (36.5 C)-99.6 F (37.6 C)] 97.8 F (36.6 C) (01/05 0740) Pulse Rate:  [97-108] 98 (01/05 0740) Resp:  [12-18] 16 (01/05 0740) BP: (126-140)/(75-92) 128/75 (01/05 0740) SpO2:  [93 %-99 %] 93 % (01/05 0740) Weight:  [81.3 kg] 81.3 kg (01/04 1930) Last BM Date : 11/27/22  Intake/Output Summary (Last 24 hours) at 11/28/2022 1014 Last data filed at 11/28/2022 1000 Gross per 24 hour  Intake 1778.33 ml  Output 1300 ml  Net 478.33 ml    Intake/Output from previous day: 01/04 0701 - 01/05 0700 In: 1478.3 [I.V.:1478.3] Out: 900 [Urine:900] Intake/Output this shift: Total I/O In: 300 [I.V.:300] Out: 400 [Urine:400]   Physical Exam:  General: Alert male in NAD Heart:   Regular rate and rhythm.  Pulmonary: Normal respiratory effort Abdomen: Soft, nondistended, nontender. Normal bowel sounds. Extremities: No lower extremity edema  Neurologic: Alert and oriented Psych: Pleasant. Cooperative.    Principal Problem:   Syncope Active Problems:   DM2 (diabetes mellitus, type 2) ([place])   GERD without esophagitis   Hyperlipemia   Depression   Diabetic polyneuropathy associated with type 2 diabetes mellitus ([place])   GIB (gastrointestinal bleeding)   History of CVA (cerebrovascular accident)   CKD stage IIIa ([place])     LOS: 0 days   Tye Savoy ,NP 11/28/2022, 10:14 AM

## 2022-11-28 NOTE — TOC Initial Note (Signed)
Transition of Care [facility]) - Initial/Assessment Note    Patient Details  Name: Adam Quinn MRN: [id number] Date of Birth: 09/24/1956  [facility]) CM/SW Contact:    Angelita Ingles, RN Phone Number:[phone]  11/28/2022, 3:53 PM  Clinical Narrative:                  Transition of Care [facility]) Screening Note   Patient Details  Name: Adam Quinn Date of Birth: 06-09-1956   Transition of Care [facility]) CM/SW Contact:    Angelita Ingles, RN Phone Number: 11/28/2022, 3:53 PM    Transition of Care Department [facility]) has reviewed patient and no TOC needs have been identified at this time. We will continue to monitor patient advancement through interdisciplinary progression rounds. If new patient transition needs arise, please place a TOC consult.          Patient Goals and CMS Choice            Expected Discharge Plan and Services                                              Prior Living Arrangements/Services                       Activities of Daily Living Home Assistive Devices/Equipment: None ADL Screening (condition at time of admission) Patient's cognitive ability adequate to safely complete daily activities?: Yes Is the patient deaf or have difficulty hearing?: No Does the patient have difficulty seeing, even when wearing glasses/contacts?: No Does the patient have difficulty concentrating, remembering, or making decisions?: No Patient able to express need for assistance with ADLs?: Yes Does the patient have difficulty dressing or bathing?: No Independently performs ADLs?: Yes (appropriate for developmental age) Does the patient have difficulty walking or climbing stairs?: No Weakness of Legs: None Weakness of Arms/Hands: None  Permission Sought/Granted                  Emotional Assessment              Admission diagnosis:  Syncope [R55] Acute GI bleeding [K92.2] Patient Active Problem List   Diagnosis Date  Noted   Syncope 11/27/2022   Secondary membrane, obscuring vision, right 06/04/2022   Posterior vitreous detachment of 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   Lamellar macular hole, 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   Acute urinary retention 03/24/2022   Diverticulosis 03/24/2022   CKD stage IIIa ([place]) 03/24/2022   GIB (gastrointestinal bleeding) 03/23/2022   Lobar pneumonia ([place]) 03/23/2022   History of CVA (cerebrovascular accident) 03/23/2022   History of colonic polyps    Polyp of ascending colon    Polyp of rectum    Rectal bleeding    Acute blood loss anemia    Hematochezia    Left upper quadrant abdominal pain    Sigmoid diverticulitis 08/01/2021   Diabetic polyneuropathy associated with type 2 diabetes mellitus ([place]) 08/01/2021   Acute lower gastrointestinal bleeding 07/31/2021   Cystoid macular edema, right eye 05/23/2021   Pain due to onychomycosis of toenails of both feet 12/21/2019   Diabetic neuropathy ([place]) 12/21/2019   Coagulation disorder ([place]) 12/21/2019   Stroke ([place]) 06/27/2019   Depression 06/27/2019   Elevated hemoglobin ([facility]) 04/05/2018   Iron deficiency anemia due to chronic blood loss 05/26/2017   Malabsorption of iron 05/26/2017  HTN (hypertension) 03/30/2017   Hyperlipemia 03/30/2017   OSA on CPAP 03/30/2017   Snoring 07/08/2016   Uncontrolled REM sleep behavior disorder 07/08/2016   Lacunar infarct, acute ([place]) 06/26/2016   Numbness 06/26/2016   Occlusion and stenosis of vertebral artery 06/26/2016   Benign neoplasm of cecum 06/04/2015   Diverticulosis of colon without hemorrhage 06/04/2015   Internal hemorrhoids 06/04/2015   Gastritis and gastroduodenitis 06/04/2015   Microcytic anemia 04/13/2015   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   Open-angle glaucoma 01/17/2013   GERD without esophagitis 10/04/2012   DM2 (diabetes mellitus, type 2) ([place]) 03/16/2008   CONSTIPATION 03/16/2008   PCP:  Mckinley Jewel, MD Pharmacy:   [facility] [address] [place] [address] Phone: [phone] Fax: [id number] - [place], [place] - [address] AT [address] [place] [address] Phone: [phone] Fax: [phone]     Social Determinants of Health (SDOH) Social History: SDOH Screenings   Food Insecurity: No Food Insecurity (08/08/2020)  Transportation Needs: No Transportation Needs (08/08/2020)  Depression (PHQ2-9): Low Risk  (08/08/2020)  Tobacco Use: Medium Risk (11/27/2022)   SDOH Interventions:     Readmission Risk Interventions     No data to display

## 2022-11-28 NOTE — Telephone Encounter (Signed)
1 year reminder placed in epic for pancreas imaging.

## 2022-11-28 NOTE — Progress Notes (Signed)
       CROSS COVER NOTE  NAME: Adam Quinn MRN: [id number] DOB : 1956-10-08    Date of Service   11/28/2022   HPI/Events of Note   Per patient request, latanoprost and dorzolamide orders have been discontinued.    Patient refusing the meds and wants orders discontinued so that it is not "accidentally" given.   Interventions/ Plan   Above       Raenette Rover, DNP, [place]

## 2022-12-01 ENCOUNTER — Other Ambulatory Visit: Payer: Self-pay

## 2022-12-01 DIAGNOSIS — D500 Iron deficiency anemia secondary to blood loss (chronic): Secondary | ICD-10-CM

## 2022-12-01 NOTE — Telephone Encounter (Signed)
Pt reports diarrhea started again on Saturday. Pt had 3 episodes of diarrhea on Saturday and he reports he had blood in the diarrhea. Blood was burgundy colored. Pt reports he feels lightheaded, weak and dizzy. Pt denies any fainting.  Pt then had no bowel movements on Sunday and reports that bowel movements have returned to normal now. Pt wanted to see if he needed blood work before appointment.

## 2022-12-01 NOTE — Telephone Encounter (Signed)
Patient called states he had another episode of diarrhea on Saturday and is feeling lite headed and dizzy. Is not sure if he should have some blood work done prior to his appointment. Please advise.

## 2022-12-01 NOTE — Telephone Encounter (Signed)
Please advise patient to come in for labs CBC, CMP, PT and INR this afternoon or tomorrow AM. He should come to ER if he develops recurrent episodes of bleeding. Thanks

## 2022-12-01 NOTE — Telephone Encounter (Signed)
Spoke with pt and gave pt Dr. Woodward Ku recommendations. Pt verbalized understanding and had no other concerns at end of call. Orders placed for labs.

## 2022-12-02 ENCOUNTER — Other Ambulatory Visit (INDEPENDENT_AMBULATORY_CARE_PROVIDER_SITE_OTHER): Payer: PPO

## 2022-12-02 DIAGNOSIS — D500 Iron deficiency anemia secondary to blood loss (chronic): Secondary | ICD-10-CM

## 2022-12-02 LAB — COMPREHENSIVE METABOLIC PANEL
ALT: 15 U/L (ref 0–53)
AST: 11 U/L (ref 0–37)
Albumin: 3.9 g/dL (ref 3.5–5.2)
Alkaline Phosphatase: 84 U/L (ref 39–117)
BUN: 12 mg/dL (ref 6–23)
CO2: 26 mEq/L (ref 19–32)
Calcium: 9 mg/dL (ref 8.4–10.5)
Chloride: 103 mEq/L (ref 96–112)
Creatinine, Ser: 1.24 mg/dL (ref 0.40–1.50)
GFR: 59.86 mL/min — ABNORMAL LOW (ref >60.00)
Glucose, Bld: 176 mg/dL — ABNORMAL HIGH (ref 70–99)
Potassium: 3.9 mEq/L (ref 3.5–5.1)
Sodium: 141 mEq/L (ref 135–145)
Total Bilirubin: 0.3 mg/dL (ref 0.2–1.2)
Total Protein: 6.5 g/dL (ref 6.0–8.3)

## 2022-12-02 LAB — CBC WITH DIFFERENTIAL/PLATELET
Basophils Absolute: 0.1 10*3/uL (ref 0.0–0.1)
Basophils Relative: 1 % (ref 0.0–3.0)
Eosinophils Absolute: 0.1 10*3/uL (ref 0.0–0.7)
Eosinophils Relative: 2.1 % (ref 0.0–5.0)
HCT: 24.2 % — ABNORMAL LOW (ref 39.0–52.0)
Hemoglobin: 8.2 g/dL — ABNORMAL LOW (ref 13.0–17.0)
Lymphocytes Relative: 16.7 % (ref 12.0–46.0)
Lymphs Abs: 1 10*3/uL (ref 0.7–4.0)
MCHC: 33.7 g/dL (ref 30.0–36.0)
MCV: 96 fl (ref 78.0–100.0)
Monocytes Absolute: 0.4 10*3/uL (ref 0.1–1.0)
Monocytes Relative: 7.2 % (ref 3.0–12.0)
Neutro Abs: 4.3 10*3/uL (ref 1.4–7.7)
Neutrophils Relative %: 73 % (ref 43.0–77.0)
Platelets: 283 10*3/uL (ref 150.0–400.0)
RBC: 2.53 Mil/uL — ABNORMAL LOW (ref 4.22–5.81)
RDW: 13.8 % (ref 11.5–15.5)
WBC: 6 10*3/uL (ref 4.0–10.5)

## 2022-12-02 LAB — PROTIME-INR
INR: 1 ratio (ref 0.8–1.0)
Prothrombin Time: 10.7 s (ref 9.6–13.1)

## 2022-12-03 DIAGNOSIS — G4733 Obstructive sleep apnea (adult) (pediatric): Secondary | ICD-10-CM | POA: Diagnosis not present

## 2022-12-04 ENCOUNTER — Other Ambulatory Visit: Payer: Self-pay

## 2022-12-04 DIAGNOSIS — D500 Iron deficiency anemia secondary to blood loss (chronic): Secondary | ICD-10-CM

## 2022-12-05 ENCOUNTER — Inpatient Hospital Stay: Payer: PPO | Attending: Hematology & Oncology

## 2022-12-05 ENCOUNTER — Inpatient Hospital Stay (HOSPITAL_BASED_OUTPATIENT_CLINIC_OR_DEPARTMENT_OTHER): Payer: PPO | Admitting: Family

## 2022-12-05 ENCOUNTER — Inpatient Hospital Stay: Payer: PPO

## 2022-12-05 ENCOUNTER — Other Ambulatory Visit: Payer: Self-pay

## 2022-12-05 ENCOUNTER — Encounter: Payer: Self-pay | Admitting: Family

## 2022-12-05 VITALS — BP 123/64 | HR 103 | Temp 97.8°F | Resp 18 | Ht 68.0 in | Wt 174.1 lb

## 2022-12-05 DIAGNOSIS — K909 Intestinal malabsorption, unspecified: Secondary | ICD-10-CM | POA: Diagnosis not present

## 2022-12-05 DIAGNOSIS — D500 Iron deficiency anemia secondary to blood loss (chronic): Secondary | ICD-10-CM | POA: Diagnosis not present

## 2022-12-05 DIAGNOSIS — K922 Gastrointestinal hemorrhage, unspecified: Secondary | ICD-10-CM | POA: Diagnosis not present

## 2022-12-05 LAB — CBC WITH DIFFERENTIAL (CANCER CENTER ONLY)
Abs Immature Granulocytes: 0.09 10*3/uL — ABNORMAL HIGH (ref 0.00–0.07)
Basophils Absolute: 0.1 10*3/uL (ref 0.0–0.1)
Basophils Relative: 1 %
Eosinophils Absolute: 0.1 10*3/uL (ref 0.0–0.5)
Eosinophils Relative: 1 %
HCT: 27.7 % — ABNORMAL LOW (ref 39.0–52.0)
Hemoglobin: 8.7 g/dL — ABNORMAL LOW (ref 13.0–17.0)
Immature Granulocytes: 1 %
Lymphocytes Relative: 19 %
Lymphs Abs: 1.5 10*3/uL (ref 0.7–4.0)
MCH: 31.9 pg (ref 26.0–34.0)
MCHC: 31.4 g/dL (ref 30.0–36.0)
MCV: 101.5 fL — ABNORMAL HIGH (ref 80.0–100.0)
Monocytes Absolute: 0.7 10*3/uL (ref 0.1–1.0)
Monocytes Relative: 8 %
Neutro Abs: 5.4 10*3/uL (ref 1.7–7.7)
Neutrophils Relative %: 70 %
Platelet Count: 343 10*3/uL (ref 150–400)
RBC: 2.73 MIL/uL — ABNORMAL LOW (ref 4.22–5.81)
RDW: 15.1 % (ref 11.5–15.5)
WBC Count: 7.9 10*3/uL (ref 4.0–10.5)
nRBC: 0 % (ref 0.0–0.2)

## 2022-12-05 LAB — IRON AND IRON BINDING CAPACITY (CC-WL,HP ONLY)
Iron: 29 ug/dL — ABNORMAL LOW (ref 45–182)
Saturation Ratios: 8 % — ABNORMAL LOW (ref 17.9–39.5)
TIBC: 344 ug/dL (ref 250–450)
UIBC: 315 ug/dL (ref 117–376)

## 2022-12-05 LAB — FERRITIN: Ferritin: 64 ng/mL (ref 24–336)

## 2022-12-05 LAB — RETICULOCYTES
Immature Retic Fract: 26.8 % — ABNORMAL HIGH (ref 2.3–15.9)
RBC.: 2.72 MIL/uL — ABNORMAL LOW (ref 4.22–5.81)
Retic Count, Absolute: 205.4 10*3/uL — ABNORMAL HIGH (ref 19.0–186.0)
Retic Ct Pct: 7.6 % — ABNORMAL HIGH (ref 0.4–3.1)

## 2022-12-05 MED ORDER — SODIUM CHLORIDE 0.9 % IV SOLN: INTRAVENOUS | Status: DC

## 2022-12-05 MED ORDER — SODIUM CHLORIDE 0.9 % IV SOLN
510.0000 mg | Freq: Once | INTRAVENOUS | Status: AC
  Administered 2022-12-05: 510 mg via INTRAVENOUS
  Filled 2022-12-05: qty 17

## 2022-12-05 NOTE — Patient Instructions (Signed)

## 2022-12-05 NOTE — Progress Notes (Signed)
Hematology and Oncology Follow Up Visit  Adam Quinn [id number] 17-May-1956 67 y.o. 12/05/2022   Principle Diagnosis:  Iron deficiency anemia Erythropoietin deficiency anemia   Current Therapy:        IV iron as indicated   Interim History:  Adam Quinn is here today for follow-up. He is symptomatic with fatigue, weakness, dizziness, SOB with exertion and chills.  He was in the hospital 2 weeks ago for GI bleed. Hgb today is 8.7, MCV101, platelets 343 and WBC count 7.9.  He has not noted any blood in his stool since Saturday.  He resumed his Plavix this week on Tuesday.  No fever, n/v, cough, rash, chest pain, abdominal pain or changes in bowel or bladder habits.  No swelling or tenderness in his extremities.  No falls or syncope.  Appetite and hydration are good. Weight is stable at 174 lbs.   ECOG Performance Status: 2 - Symptomatic, <50% confined to bed  Medications:  Allergies as of 12/05/2022       Reactions   Aspirin Anaphylaxis, Hives   Shrimp [shellfish Allergy] Hives   Just shrimp        Medication List        Accurate as of December 05, 2022  1:58 PM. If you have any questions, ask your nurse or doctor.          Alavert D-12 Hour Allergy/Cong 5-120 MG tablet Generic drug: loratadine-pseudoephedrine Take 1 tablet by mouth at bedtime.   Carestart COVID-19 Home Test Kit Generic drug: COVID-19 At Home Antigen Test Use as directed   clopidogrel 75 MG tablet Commonly known as: PLAVIX Take 1 tablet (75 mg total) by mouth daily. Resume 1/8   dorzolamide 2 % ophthalmic solution Commonly known as: TRUSOPT Place 1 drop in both eyes twice a day   ELDERBERRY PO Take 1 tablet by mouth at bedtime. Gummy   FIBER PO Take 1 tablet by mouth daily. Miralax brand fiber gummy.   FreeStyle Libre 2 Sensor Misc Apply sensor to body externally every 2 weeks as directed. To read blood sugar continuously.   gabapentin 300 MG capsule Commonly known as:  NEURONTIN Take 1 capsule (300 mg total) by mouth daily.   lansoprazole 30 MG capsule Commonly known as: PREVACID Take 1 capsule (30 mg total) by mouth 2 (two) times daily.   Lumigan 0.01 % Soln Generic drug: bimatoprost Instill 1 drop in both eyes at bedtime   Melatonin 5 MG Chew Chew 5 mg by mouth at bedtime.   metFORMIN 500 MG 24 hr tablet Commonly known as: GLUCOPHAGE-XR Take 2 tablets (1,000 mg total) by mouth 2 (two) times daily.   Pentips 31G X 5 MM Misc Generic drug: Insulin Pen Needle Use as directed with Tyler Aas   RA PROBIOTIC GUMMIES PO Take 1 capsule by mouth at bedtime.   sertraline 100 MG tablet Commonly known as: ZOLOFT Take 1 tablet (100 mg total) by mouth daily.   simvastatin 20 MG tablet Commonly known as: ZOCOR Take 1 tablet by mouth once a day   sodium bicarbonate 650 MG tablet Take 1 tablet by mouth 2 times daily   tamsulosin 0.4 MG Caps capsule Commonly known as: FLOMAX Take 1 capsule (0.4 mg total) by mouth at bedtime.   Tyler Aas FlexTouch 200 UNIT/ML FlexTouch Pen Generic drug: insulin degludec Inject 30 Units into the skin daily. What changed:  when to take this additional instructions   Trulicity 1.5 NI/7.7OE Sopn Generic drug: Dulaglutide Inject under the skin  once a week What changed:  how much to take how to take this when to take this        Allergies:  Allergies  Allergen Reactions   Aspirin Anaphylaxis and Hives   Shrimp [Shellfish Allergy] Hives    Just shrimp     Past Medical History, Surgical history, Social history, and Family History were reviewed and updated.  Review of Systems: All other 10 point review of systems is negative.   Physical Exam:  height is '5\' 8"'$  (1.727 m) and weight is 174 lb 1.3 oz (79 kg). His oral temperature is 97.8 F (36.6 C). His blood pressure is 123/64 and his pulse is 103 (abnormal). His respiration is 18 and oxygen saturation is 100%.   Wt Readings from Last 3 Encounters:   12/05/22 174 lb 1.3 oz (79 kg)  11/28/22 176 lb 12.8 oz (80.2 kg)  10/21/22 177 lb 9.6 oz (80.6 kg)    Ocular: Sclerae unicteric, pupils equal, round and reactive to light Ear-nose-throat: Oropharynx clear, dentition fair Lymphatic: No cervical or supraclavicular adenopathy Lungs no rales or rhonchi, good excursion bilaterally Heart regular rate and rhythm, no murmur appreciated Abd soft, nontender, positive bowel sounds MSK no focal spinal tenderness, no joint edema Neuro: non-focal, well-oriented, appropriate affect Breasts: Deferred   Lab Results  Component Value Date   WBC 7.9 12/05/2022   HGB 8.7 (L) 12/05/2022   HCT 27.7 (L) 12/05/2022   MCV 101.5 (H) 12/05/2022   PLT 343 12/05/2022   Lab Results  Component Value Date   FERRITIN 248 10/21/2022   IRON 73 10/21/2022   TIBC 335 10/21/2022   UIBC 262 10/21/2022   IRONPCTSAT 22 10/21/2022   Lab Results  Component Value Date   RETICCTPCT 7.6 (H) 12/05/2022   RBC 2.72 (L) 12/05/2022   RBC 2.73 (L) 12/05/2022   No results found for: "KPAFRELGTCHN", "LAMBDASER", "KAPLAMBRATIO" No results found for: "IGGSERUM", "IGA", "IGMSERUM" No results found for: "TOTALPROTELP", "ALBUMINELP", "A1GS", "A2GS", "BETS", "BETA2SER", "GAMS", "MSPIKE", "SPEI"   Chemistry      Component Value Date/Time   NA 141 12/02/2022 0938   NA 140 05/26/2017 0856   K 3.9 12/02/2022 0938   K 4.2 05/26/2017 0856   CL 103 12/02/2022 0938   CO2 26 12/02/2022 0938   CO2 25 05/26/2017 0856   BUN 12 12/02/2022 0938   BUN 12.7 05/26/2017 0856   CREATININE 1.24 12/02/2022 0938   CREATININE 1.32 (H) 06/13/2022 1027   CREATININE 1.2 05/26/2017 0856      Component Value Date/Time   CALCIUM 9.0 12/02/2022 0938   CALCIUM 9.5 05/26/2017 0856   ALKPHOS 84 12/02/2022 0938   ALKPHOS 83 05/26/2017 0856   AST 11 12/02/2022 0938   AST 13 (L) 06/13/2022 1027   AST 16 05/26/2017 0856   ALT 15 12/02/2022 0938   ALT 15 06/13/2022 1027   ALT 22 05/26/2017 0856    BILITOT 0.3 12/02/2022 0938   BILITOT 0.2 (L) 06/13/2022 1027   BILITOT 0.28 05/26/2017 0856       Impression and Plan: Adam Quinn is a very pleasant 67 yo caucasian gentleman with 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  Iron studies are pending.  Lab check in 1 month and follow-up in 2 months.   Lottie Dawson, NP 1/12/20241:58 PM

## 2022-12-08 ENCOUNTER — Telehealth: Payer: Self-pay | Admitting: Pharmacy Technician

## 2022-12-08 ENCOUNTER — Encounter: Payer: Self-pay | Admitting: Family

## 2022-12-08 ENCOUNTER — Encounter: Payer: Self-pay | Admitting: Gastroenterology

## 2022-12-08 NOTE — Telephone Encounter (Signed)
Fyi note:  Auth Submission: APPROVED Payer: [employer] ADVT Medication & CPT/J Code(s) submitted: Feraheme (ferumoxytol) [id number] Route of submission (phone, fax, portal):  Phone [phone] Fax [phone] Auth type: Buy/Bill Units/visits requested: 2 Reference number: [id number] Approval from: 12/05/22 to 03/05/23   Patient will be scheduled as soon as possible

## 2022-12-11 ENCOUNTER — Other Ambulatory Visit (HOSPITAL_BASED_OUTPATIENT_CLINIC_OR_DEPARTMENT_OTHER): Payer: Self-pay

## 2022-12-12 ENCOUNTER — Other Ambulatory Visit: Payer: Self-pay

## 2022-12-12 ENCOUNTER — Inpatient Hospital Stay: Payer: PPO

## 2022-12-12 ENCOUNTER — Other Ambulatory Visit (HOSPITAL_BASED_OUTPATIENT_CLINIC_OR_DEPARTMENT_OTHER): Payer: Self-pay

## 2022-12-12 ENCOUNTER — Other Ambulatory Visit: Payer: Self-pay | Admitting: Internal Medicine

## 2022-12-12 VITALS — BP 105/53 | HR 105 | Temp 97.7°F | Resp 18

## 2022-12-12 DIAGNOSIS — D582 Other hemoglobinopathies: Secondary | ICD-10-CM

## 2022-12-12 DIAGNOSIS — I6381 Other cerebral infarction due to occlusion or stenosis of small artery: Secondary | ICD-10-CM

## 2022-12-12 DIAGNOSIS — D500 Iron deficiency anemia secondary to blood loss (chronic): Secondary | ICD-10-CM | POA: Diagnosis not present

## 2022-12-12 MED ORDER — INSULIN PEN NEEDLE 31G X 5 MM MISC
1 refills | Status: DC
  Filled 2022-12-12: qty 100, 90d supply, fill #0
  Filled 2023-03-10: qty 100, 90d supply, fill #1

## 2022-12-12 MED ORDER — SODIUM CHLORIDE 0.9% FLUSH: 10.0000 mL | INTRAVENOUS | Status: DC | PRN

## 2022-12-12 MED ORDER — SODIUM CHLORIDE 0.9% FLUSH: 3.0000 mL | Freq: Once | INTRAVENOUS | Status: DC | PRN

## 2022-12-12 MED ORDER — SODIUM CHLORIDE 0.9 % IV SOLN: INTRAVENOUS | Status: DC

## 2022-12-12 MED ORDER — SODIUM CHLORIDE 0.9 % IV SOLN
510.0000 mg | Freq: Once | INTRAVENOUS | Status: AC
  Administered 2022-12-12: 510 mg via INTRAVENOUS
  Filled 2022-12-12: qty 17

## 2022-12-12 NOTE — Progress Notes (Signed)
OK to treat with HR of 108 per Lottie Dawson, NP

## 2022-12-12 NOTE — Patient Instructions (Signed)

## 2022-12-16 ENCOUNTER — Other Ambulatory Visit (HOSPITAL_BASED_OUTPATIENT_CLINIC_OR_DEPARTMENT_OTHER): Payer: Self-pay

## 2022-12-16 ENCOUNTER — Other Ambulatory Visit: Payer: Self-pay | Admitting: Internal Medicine

## 2022-12-16 DIAGNOSIS — I6381 Other cerebral infarction due to occlusion or stenosis of small artery: Secondary | ICD-10-CM

## 2022-12-16 MED ORDER — CLOPIDOGREL BISULFATE 75 MG PO TABS
75.0000 mg | ORAL_TABLET | Freq: Every day | ORAL | 1 refills | Status: DC
  Filled 2022-12-16: qty 90, 90d supply, fill #0
  Filled 2023-03-13: qty 90, 90d supply, fill #1

## 2022-12-18 ENCOUNTER — Ambulatory Visit (INDEPENDENT_AMBULATORY_CARE_PROVIDER_SITE_OTHER): Payer: PPO | Admitting: Nurse Practitioner

## 2022-12-18 ENCOUNTER — Other Ambulatory Visit (INDEPENDENT_AMBULATORY_CARE_PROVIDER_SITE_OTHER): Payer: PPO

## 2022-12-18 ENCOUNTER — Encounter: Payer: Self-pay | Admitting: Nurse Practitioner

## 2022-12-18 VITALS — BP 118/60 | HR 103 | Ht 68.0 in | Wt 165.0 lb

## 2022-12-18 DIAGNOSIS — R935 Abnormal findings on diagnostic imaging of other abdominal regions, including retroperitoneum: Secondary | ICD-10-CM

## 2022-12-18 DIAGNOSIS — D649 Anemia, unspecified: Secondary | ICD-10-CM | POA: Diagnosis not present

## 2022-12-18 DIAGNOSIS — K922 Gastrointestinal hemorrhage, unspecified: Secondary | ICD-10-CM | POA: Diagnosis not present

## 2022-12-18 LAB — CBC
HCT: 34.3 % — ABNORMAL LOW (ref 39.0–52.0)
Hemoglobin: 11.5 g/dL — ABNORMAL LOW (ref 13.0–17.0)
MCHC: 33.6 g/dL (ref 30.0–36.0)
MCV: 96.2 fl (ref 78.0–100.0)
Platelets: 271 10*3/uL (ref 150.0–400.0)
RBC: 3.57 Mil/uL — ABNORMAL LOW (ref 4.22–5.81)
RDW: 15 % (ref 11.5–15.5)
WBC: 6.7 10*3/uL (ref 4.0–10.5)

## 2022-12-18 NOTE — Patient Instructions (Signed)
_______________________________________________________  If your blood pressure at your visit was 140/90 or greater, please contact your primary care physician to follow up on this.  _______________________________________________________  If you are age 69 or older, your body mass index should be between 23-30. Your Body mass index is 25.09 kg/m. If this is out of the aforementioned range listed, please consider follow up with your Primary Care Provider.  If you are age 20 or younger, your body mass index should be between 19-25. Your Body mass index is 25.09 kg/m. If this is out of the aformentioned range listed, please consider follow up with your Primary Care Provider.   ________________________________________________________  The [place] providers would like to encourage you to use [facility] to communicate with providers for non-urgent requests or questions.  Due to long hold times on the telephone, sending your provider a message by [facility] may be a faster and more efficient way to get a response.  Please allow 48 business hours for a response.  Please remember that this is for non-urgent requests.  _______________________________________________________  Your provider has requested that you go to the basement level for lab work before leaving today. Press "B" on the elevator. The lab is located at the first door on the left as you exit the elevator.  Thank you for trusting me with your gastrointestinal care!   Tye Savoy NP

## 2022-12-18 NOTE — Progress Notes (Signed)
Assessment    Patient profile:  Adam Quinn is a 67 y.o. male known to Dr. Silverio Decamp with a past medical history of colon polyps, diverticulosis iron deficiency anemia, DM2, CVA on chronic plavix, OSA. See PMH /PSH for additional history  # Chronic iron deficiency anemia with previous unrevealing GI workup. Followed by Hematology who gives him IV Fe.   # Acute on chronic anemia in setting of recurrent GI bleeding with maroon stools on Plavix earlier this month. Source of bleeding elusive as before, ? Diverticular.  CTA negative for active bleeding. Hgb trended down from nearly 14 to 9.9 upon discharge. Further decline in hgb to 8.7 on outpatient labs at Hematology's office 12/05/22. Got IV iron on 1/12 and on 1/19   # Subcentimeter but progressively calcifying lesion of the pancreatic head/uncinate on CT scan . Unclear significance  Plan    CBC today Will need a pancreas protocol CT abdomen versus MRI/MRCP in 1 year to evaluate the pancreatic calcification lesion in the head/uncinate. Placed on recall list  HPI    Chief complaint: hospital follow up   Adam Quinn was hospitalized earlier this month for N/V/D with maroon blood and syncopal episode.  We saw him in consult on 11/27/22. CTA negative for active bleeding. Hgb declined from 13.9 to 9.9. Bleeding ceased, he was discharged the following day. He had an a BM the next day which contained old blood. No blood in stool since. Post discharge labs by US showed a further decline into the 8 range.  Patient subsequently had a follow-up with his hematologist and has since received 2 iron infusions  Previous GI Evaluation   Colonoscopy July 2016 with removal of 15 mm pedunculated cecal polyp and diverticulosis  Colonoscopy in June 2020 showed diverticulosis, was inadequate prep.  He was recommended to repeat colonoscopy in 6 months  CT angio abdomen and pelvis 07/31/21 showed evidence of sigmoid diverticulitis otherwise unremarkable exam  EGD  August 02, 2021 was negative for any acute upper GI blood loss source   Colonoscopy 01/30/22 - One 12 mm polyp in the ascending colon, removed with a cold snare. Resected and retrieved. - One 4 mm polyp in the rectum, removed with a cold snare. Resected and retrieved. - Severe diverticulosis in the sigmoid colon, in the descending colon, in the transverse colon, in the ascending colon and in the cecum. There was evidence of diverticular spasm. Peri-diverticular erythema was seen. There was evidence of an impacted diverticulum. - Non-bleeding external and internal hemorrhoid  03/23/22 CT angio abdomen and pelvis negative for active bleeding  VCE May 2023 Complete study, adequate prep.  One erythematous gastric fold, otherwise normal  Bleeding scan May 2023 Negative for active bleeding  CT angio abd / pelvis 11/27/22 Negative for active bleeding  Labs:     Latest Ref Rng & Units 12/05/2022    1:13 PM 12/02/2022    9:38 AM 11/28/2022    5:28 AM  CBC  WBC 4.0 - 10.5 K/uL 7.9  6.0  6.4   Hemoglobin 13.0 - 17.0 g/dL 8.7  8.2 Repeated and verified X2.  9.9   Hematocrit 39.0 - 52.0 % 27.7  24.2 Repeated and verified X2.  30.1   Platelets 150 - 400 K/uL 343  283.0  220        Latest Ref Rng & Units 12/02/2022    9:38 AM 11/28/2022    5:28 AM 11/27/2022    3:44 AM  Hepatic Function  Total Protein  6.0 - 8.3 g/dL 6.5  5.7  6.4   Albumin 3.5 - 5.2 g/dL 3.9  3.1  3.6   AST 0 - 37 U/L '11  14  15   '$ ALT 0 - 53 U/L '15  15  17   '$ Alk Phosphatase 39 - 117 U/L 84  71  92   Total Bilirubin 0.2 - 1.2 mg/dL 0.3  0.3  0.4      Past Medical History:  Diagnosis Date   Allergy    seasonal   Anxiety    Cataract    cataract surgery bilaterally resolved issues   Central scotoma    Chronic kidney disease    small protien showing uses Lisinopril for this   Diverticulitis    Diverticulosis    DM (diabetes mellitus) (HCC)    GERD (gastroesophageal reflux disease)    Glaucoma    History of colon polyps     History of hiatal hernia    History of kidney stones    x1   Iron deficiency anemia    Iron infusion 04/29/2019   Neuropathy    OSA on CPAP    Stroke ([place])    Wears glasses    READING    Past Surgical History:  Procedure Laterality Date   cataract surgery     bilateral   COLONOSCOPY     COLONOSCOPY N/A 06/04/2015   Procedure: COLONOSCOPY;  Surgeon: Inda Castle, MD;  Location: [facility];  Service: Endoscopy;  Laterality: N/A;   COLONOSCOPY WITH PROPOFOL N/A 05/09/2019   Procedure: COLONOSCOPY WITH PROPOFOL;  Surgeon: Doran Stabler, MD;  Location: [facility];  Service: Gastroenterology;  Laterality: N/A;   COLONOSCOPY WITH PROPOFOL N/A 01/30/2022   Procedure: COLONOSCOPY WITH PROPOFOL;  Surgeon: Mauri Pole, MD;  Location: [facility];  Service: Endoscopy;  Laterality: N/A;   ESOPHAGOGASTRODUODENOSCOPY N/A 06/04/2015   Procedure: ESOPHAGOGASTRODUODENOSCOPY (EGD);  Surgeon: Inda Castle, MD;  Location: [facility];  Service: Endoscopy;  Laterality: N/A;   ESOPHAGOGASTRODUODENOSCOPY (EGD) WITH PROPOFOL N/A 08/02/2021   Procedure: ESOPHAGOGASTRODUODENOSCOPY (EGD) WITH PROPOFOL;  Surgeon: Mauri Pole, MD;  Location: [facility];  Service: Endoscopy;  Laterality: N/A;   POLYPECTOMY  01/30/2022   Procedure: POLYPECTOMY;  Surgeon: Mauri Pole, MD;  Location: [facility];  Service: Endoscopy;;   UPPER GASTROINTESTINAL ENDOSCOPY     WISDOM TOOTH EXTRACTION     with sedation    Current Medications, Allergies, Family History and Social History were reviewed in [employer] record.     Current Outpatient Medications  Medication Sig Dispense Refill   bimatoprost (LUMIGAN) 0.01 % SOLN Instill 1 drop in both eyes at bedtime 7.5 mL 3   clopidogrel (PLAVIX) 75 MG tablet Take 1 tablet (75 mg total) by mouth daily. Resume 1/8     clopidogrel (PLAVIX) 75 MG tablet Take 1 tablet (75 mg total) by mouth daily. 90 tablet 1   Continuous Blood  Gluc Sensor (FREESTYLE LIBRE 2 SENSOR) MISC Apply sensor to body externally every 2 weeks as directed. To read blood sugar continuously. 6 each 1   COVID-19 At Home Antigen Test (CARESTART COVID-19 HOME TEST) KIT Use as directed 4 each 0   dorzolamide (TRUSOPT) 2 % ophthalmic solution Place 1 drop in both eyes twice a day 30 mL 3   Dulaglutide (TRULICITY) 1.5 HU/3.1SH SOPN Inject under the skin once a week (Patient taking differently: Inject 1.5 mg into the skin every Sunday.) 6 mL 4  ELDERBERRY PO Take 1 tablet by mouth at bedtime. Gummy     FIBER PO Take 1 tablet by mouth daily. Miralax brand fiber gummy.     gabapentin (NEURONTIN) 300 MG capsule Take 1 capsule (300 mg total) by mouth daily. 90 capsule 1   insulin degludec (TRESIBA FLEXTOUCH) 200 UNIT/ML FlexTouch Pen Inject 30 Units into the skin daily. (Patient taking differently: Inject 30 Units into the skin at bedtime. If BG is >150) 9 mL 1   Insulin Pen Needle 31G X 5 MM MISC Use as directed with Tresiba 100 each 1   lansoprazole (PREVACID) 30 MG capsule Take 1 capsule (30 mg total) by mouth 2 (two) times daily. 180 capsule 2   loratadine-pseudoephedrine (ALAVERT D-12 HOUR ALLERGY/CONG) 5-120 MG tablet Take 1 tablet by mouth at bedtime.     Melatonin 5 MG CHEW Chew 5 mg by mouth at bedtime.     metFORMIN (GLUCOPHAGE-XR) 500 MG 24 hr tablet Take 2 tablets (1,000 mg total) by mouth 2 (two) times daily. 360 tablet 0   Probiotic Product (RA PROBIOTIC GUMMIES PO) Take 1 capsule by mouth at bedtime.     sertraline (ZOLOFT) 100 MG tablet Take 1 tablet (100 mg total) by mouth daily. 90 tablet 0   simvastatin (ZOCOR) 20 MG tablet Take 1 tablet by mouth once a day 90 tablet 1   sodium bicarbonate 650 MG tablet Take 1 tablet by mouth 2 times daily 180 tablet 0   tamsulosin (FLOMAX) 0.4 MG CAPS capsule Take 1 capsule (0.4 mg total) by mouth at bedtime. 90 capsule 3   No current facility-administered medications for this visit.    Review of  Systems: No chest pain. No shortness of breath. No urinary complaints.    Physical Exam  Wt Readings from Last 3 Encounters:  12/05/22 174 lb 1.3 oz (79 kg)  11/28/22 176 lb 12.8 oz (80.2 kg)  10/21/22 177 lb 9.6 oz (80.6 kg)    BP 118/60   Pulse (!) 103   Ht '5\' 8"'$  (1.727 m)   Wt 165 lb (74.8 kg)   SpO2 97%   BMI 25.09 kg/m  Constitutional:  Generally well appearing but pale male in no acute distress. Psychiatric: Pleasant. Normal mood and affect. Behavior is normal. EENT: Pupils normal.  Conjunctivae are normal. No scleral icterus. Neck supple.  Cardiovascular: Normal rate, regular rhythm.  Pulmonary/chest: Effort normal and breath sounds normal. No wheezing, rales or rhonchi. Abdominal: Soft, nondistended, nontender. Bowel sounds active throughout. There are no masses palpable. No hepatomegaly. Neurological: Alert and oriented to person place and time. Musculoskeletal: No edema Skin: Skin is warm and dry. No rashes noted.  Tye Savoy, NP  12/18/2022, 12:50 PM

## 2022-12-22 ENCOUNTER — Inpatient Hospital Stay: Payer: PPO

## 2022-12-26 ENCOUNTER — Other Ambulatory Visit (HOSPITAL_BASED_OUTPATIENT_CLINIC_OR_DEPARTMENT_OTHER): Payer: Self-pay

## 2022-12-30 ENCOUNTER — Other Ambulatory Visit (HOSPITAL_BASED_OUTPATIENT_CLINIC_OR_DEPARTMENT_OTHER): Payer: Self-pay

## 2022-12-30 MED ORDER — TRULICITY 1.5 MG/0.5ML ~~LOC~~ SOAJ
1.5000 mg | SUBCUTANEOUS | 1 refills | Status: DC
  Filled 2022-12-30: qty 2, 28d supply, fill #0
  Filled 2023-01-26 – 2023-01-29 (×2): qty 2, 28d supply, fill #1
  Filled 2023-02-23: qty 2, 28d supply, fill #2
  Filled 2023-03-24: qty 2, 28d supply, fill #3
  Filled 2023-04-27: qty 2, 28d supply, fill #4
  Filled 2023-05-25: qty 2, 28d supply, fill #5

## 2023-01-05 ENCOUNTER — Inpatient Hospital Stay: Payer: PPO | Attending: Hematology & Oncology

## 2023-01-05 DIAGNOSIS — D500 Iron deficiency anemia secondary to blood loss (chronic): Secondary | ICD-10-CM

## 2023-01-05 DIAGNOSIS — K909 Intestinal malabsorption, unspecified: Secondary | ICD-10-CM | POA: Insufficient documentation

## 2023-01-05 DIAGNOSIS — K922 Gastrointestinal hemorrhage, unspecified: Secondary | ICD-10-CM | POA: Insufficient documentation

## 2023-01-05 LAB — CBC WITH DIFFERENTIAL (CANCER CENTER ONLY)
Abs Immature Granulocytes: 0.02 10*3/uL (ref 0.00–0.07)
Basophils Absolute: 0.1 10*3/uL (ref 0.0–0.1)
Basophils Relative: 1 %
Eosinophils Absolute: 0.1 10*3/uL (ref 0.0–0.5)
Eosinophils Relative: 2 %
HCT: 44.8 % (ref 39.0–52.0)
Hemoglobin: 14.1 g/dL (ref 13.0–17.0)
Immature Granulocytes: 0 %
Lymphocytes Relative: 21 %
Lymphs Abs: 1.5 10*3/uL (ref 0.7–4.0)
MCH: 31.4 pg (ref 26.0–34.0)
MCHC: 31.5 g/dL (ref 30.0–36.0)
MCV: 99.8 fL (ref 80.0–100.0)
Monocytes Absolute: 0.6 10*3/uL (ref 0.1–1.0)
Monocytes Relative: 8 %
Neutro Abs: 5 10*3/uL (ref 1.7–7.7)
Neutrophils Relative %: 68 %
Platelet Count: 267 10*3/uL (ref 150–400)
RBC: 4.49 MIL/uL (ref 4.22–5.81)
RDW: 13 % (ref 11.5–15.5)
WBC Count: 7.3 10*3/uL (ref 4.0–10.5)
nRBC: 0 % (ref 0.0–0.2)

## 2023-01-05 LAB — RETICULOCYTES
Immature Retic Fract: 10.4 % (ref 2.3–15.9)
RBC.: 4.49 MIL/uL (ref 4.22–5.81)
Retic Count, Absolute: 59.7 10*3/uL (ref 19.0–186.0)
Retic Ct Pct: 1.3 % (ref 0.4–3.1)

## 2023-01-05 LAB — FERRITIN: Ferritin: 198 ng/mL (ref 24–336)

## 2023-01-05 LAB — IRON AND IRON BINDING CAPACITY (CC-WL,HP ONLY)
Iron: 71 ug/dL (ref 45–182)
Saturation Ratios: 20 % (ref 17.9–39.5)
TIBC: 364 ug/dL (ref 250–450)
UIBC: 293 ug/dL (ref 117–376)

## 2023-01-11 ENCOUNTER — Other Ambulatory Visit (HOSPITAL_BASED_OUTPATIENT_CLINIC_OR_DEPARTMENT_OTHER): Payer: Self-pay

## 2023-01-12 ENCOUNTER — Other Ambulatory Visit (HOSPITAL_BASED_OUTPATIENT_CLINIC_OR_DEPARTMENT_OTHER): Payer: Self-pay

## 2023-01-12 MED ORDER — LUMIGAN 0.01 % OP SOLN
1.0000 [drp] | Freq: Every day | OPHTHALMIC | 4 refills | Status: DC
  Filled 2023-01-12: qty 7.5, 75d supply, fill #0
  Filled 2023-03-27: qty 7.5, 75d supply, fill #1
  Filled 2023-06-22: qty 7.5, 75d supply, fill #2
  Filled 2023-08-31: qty 7.5, 75d supply, fill #3
  Filled 2023-11-16 (×2): qty 7.5, 75d supply, fill #4

## 2023-01-13 ENCOUNTER — Other Ambulatory Visit (HOSPITAL_BASED_OUTPATIENT_CLINIC_OR_DEPARTMENT_OTHER): Payer: Self-pay

## 2023-01-14 ENCOUNTER — Encounter: Payer: Self-pay | Admitting: Family

## 2023-01-14 ENCOUNTER — Encounter: Payer: Self-pay | Admitting: Podiatry

## 2023-01-14 ENCOUNTER — Ambulatory Visit (INDEPENDENT_AMBULATORY_CARE_PROVIDER_SITE_OTHER): Payer: PPO | Admitting: Podiatry

## 2023-01-14 ENCOUNTER — Encounter: Payer: Self-pay | Admitting: Gastroenterology

## 2023-01-14 DIAGNOSIS — M79675 Pain in left toe(s): Secondary | ICD-10-CM

## 2023-01-14 DIAGNOSIS — E1142 Type 2 diabetes mellitus with diabetic polyneuropathy: Secondary | ICD-10-CM | POA: Diagnosis not present

## 2023-01-14 DIAGNOSIS — M79674 Pain in right toe(s): Secondary | ICD-10-CM

## 2023-01-14 DIAGNOSIS — B351 Tinea unguium: Secondary | ICD-10-CM

## 2023-01-14 NOTE — Progress Notes (Signed)
This patient returns to my office for at risk foot care.  This patient requires this care by a professional since this patient will be at risk due to having diabetic neuropathy anc coagulation defect.  This patient is unable to cut nails himself since the patient cannot reach his nails.These nails are painful walking and wearing shoes.  This patient presents for at risk foot care today.  General Appearance  Alert, conversant and in no acute stress.  Vascular  Dorsalis pedis and posterior tibial  pulses are palpable  bilaterally.  Capillary return is within normal limits  bilaterally. Temperature is within normal limits  bilaterally.  Neurologic  Senn-Weinstein monofilament wire test absent  bilaterally. Muscle power within normal limits bilaterally.  Nails Thick disfigured discolored nails with subungual debris  Hallux nails  B/L. No evidence of bacterial infection or drainage bilaterally.  Orthopedic  No limitations of motion  feet .  No crepitus or effusions noted.  Hammer toes 2-4  B/L. Hallux malleus  B/L.  Cavus foot  B/L.  Skin  normotropic skin with no porokeratosis noted bilaterally.  No signs of infections or ulcers noted.     Onychomycosis  Pain in right toes  Pain in left toes  Consent was obtained for treatment procedures.   Mechanical debridement of hallux nails  bilaterally performed with a nail nipper.  Filed with dremel without incident.    Return office visit   4   months                  Told patient to return for periodic foot care and evaluation due to potential at risk complications.   Gardiner Barefoot DPM

## 2023-01-15 DIAGNOSIS — H18593 Other hereditary corneal dystrophies, bilateral: Secondary | ICD-10-CM | POA: Diagnosis not present

## 2023-01-15 DIAGNOSIS — H401132 Primary open-angle glaucoma, bilateral, moderate stage: Secondary | ICD-10-CM | POA: Diagnosis not present

## 2023-01-15 DIAGNOSIS — Z961 Presence of intraocular lens: Secondary | ICD-10-CM | POA: Diagnosis not present

## 2023-01-15 DIAGNOSIS — E119 Type 2 diabetes mellitus without complications: Secondary | ICD-10-CM | POA: Diagnosis not present

## 2023-01-20 ENCOUNTER — Other Ambulatory Visit (HOSPITAL_BASED_OUTPATIENT_CLINIC_OR_DEPARTMENT_OTHER): Payer: Self-pay

## 2023-01-21 ENCOUNTER — Other Ambulatory Visit (HOSPITAL_BASED_OUTPATIENT_CLINIC_OR_DEPARTMENT_OTHER): Payer: Self-pay

## 2023-01-21 MED ORDER — SERTRALINE HCL 100 MG PO TABS
100.0000 mg | ORAL_TABLET | Freq: Every day | ORAL | 1 refills | Status: DC
  Filled 2023-01-21: qty 90, 90d supply, fill #0
  Filled 2023-04-20: qty 90, 90d supply, fill #1

## 2023-01-21 MED ORDER — SIMVASTATIN 20 MG PO TABS
20.0000 mg | ORAL_TABLET | Freq: Every day | ORAL | 1 refills | Status: DC
  Filled 2023-01-21: qty 90, 90d supply, fill #0
  Filled 2023-04-16: qty 90, 90d supply, fill #1

## 2023-01-26 ENCOUNTER — Encounter: Payer: Self-pay | Admitting: Gastroenterology

## 2023-01-26 ENCOUNTER — Other Ambulatory Visit (HOSPITAL_BASED_OUTPATIENT_CLINIC_OR_DEPARTMENT_OTHER): Payer: Self-pay

## 2023-01-26 ENCOUNTER — Encounter: Payer: Self-pay | Admitting: Family

## 2023-01-27 ENCOUNTER — Other Ambulatory Visit (HOSPITAL_BASED_OUTPATIENT_CLINIC_OR_DEPARTMENT_OTHER): Payer: Self-pay

## 2023-01-28 ENCOUNTER — Encounter: Payer: Self-pay | Admitting: Family

## 2023-01-28 ENCOUNTER — Other Ambulatory Visit (HOSPITAL_BASED_OUTPATIENT_CLINIC_OR_DEPARTMENT_OTHER): Payer: Self-pay

## 2023-01-28 ENCOUNTER — Encounter: Payer: Self-pay | Admitting: Gastroenterology

## 2023-01-28 ENCOUNTER — Other Ambulatory Visit: Payer: Self-pay

## 2023-01-28 MED ORDER — CHLORHEXIDINE GLUCONATE 0.12 % MT SOLN
OROMUCOSAL | 0 refills | Status: DC
  Filled 2023-01-28: qty 473, 14d supply, fill #0

## 2023-01-28 MED ORDER — PENICILLIN V POTASSIUM 500 MG PO TABS
500.0000 mg | ORAL_TABLET | Freq: Four times a day (QID) | ORAL | 0 refills | Status: DC
  Filled 2023-01-28: qty 56, 14d supply, fill #0

## 2023-01-28 MED ORDER — SODIUM BICARBONATE 650 MG PO TABS
650.0000 mg | ORAL_TABLET | Freq: Two times a day (BID) | ORAL | 1 refills | Status: DC
  Filled 2023-01-28: qty 180, 90d supply, fill #0
  Filled 2023-04-27: qty 180, 90d supply, fill #1

## 2023-01-28 MED ORDER — IBUPROFEN 800 MG PO TABS
ORAL_TABLET | ORAL | 0 refills | Status: DC
  Filled 2023-01-28: qty 20, 5d supply, fill #0

## 2023-01-29 ENCOUNTER — Encounter: Payer: Self-pay | Admitting: Family

## 2023-01-29 ENCOUNTER — Encounter: Payer: Self-pay | Admitting: Gastroenterology

## 2023-01-29 ENCOUNTER — Other Ambulatory Visit (HOSPITAL_BASED_OUTPATIENT_CLINIC_OR_DEPARTMENT_OTHER): Payer: Self-pay

## 2023-02-03 ENCOUNTER — Encounter: Payer: Self-pay | Admitting: Family

## 2023-02-03 ENCOUNTER — Inpatient Hospital Stay: Payer: PPO

## 2023-02-03 ENCOUNTER — Inpatient Hospital Stay: Payer: PPO | Attending: Hematology & Oncology | Admitting: Family

## 2023-02-03 VITALS — BP 137/75 | HR 106 | Temp 99.3°F | Resp 17 | Wt 179.0 lb

## 2023-02-03 DIAGNOSIS — D500 Iron deficiency anemia secondary to blood loss (chronic): Secondary | ICD-10-CM | POA: Diagnosis not present

## 2023-02-03 DIAGNOSIS — K922 Gastrointestinal hemorrhage, unspecified: Secondary | ICD-10-CM | POA: Diagnosis not present

## 2023-02-03 DIAGNOSIS — K909 Intestinal malabsorption, unspecified: Secondary | ICD-10-CM | POA: Diagnosis not present

## 2023-02-03 LAB — CBC WITH DIFFERENTIAL (CANCER CENTER ONLY)
Abs Immature Granulocytes: 0.08 10*3/uL — ABNORMAL HIGH (ref 0.00–0.07)
Basophils Absolute: 0 10*3/uL (ref 0.0–0.1)
Basophils Relative: 1 %
Eosinophils Absolute: 0.1 10*3/uL (ref 0.0–0.5)
Eosinophils Relative: 2 %
HCT: 40.9 % (ref 39.0–52.0)
Hemoglobin: 13.6 g/dL (ref 13.0–17.0)
Immature Granulocytes: 1 %
Lymphocytes Relative: 22 %
Lymphs Abs: 1.4 10*3/uL (ref 0.7–4.0)
MCH: 31 pg (ref 26.0–34.0)
MCHC: 33.3 g/dL (ref 30.0–36.0)
MCV: 93.2 fL (ref 80.0–100.0)
Monocytes Absolute: 0.5 10*3/uL (ref 0.1–1.0)
Monocytes Relative: 8 %
Neutro Abs: 4.1 10*3/uL (ref 1.7–7.7)
Neutrophils Relative %: 66 %
Platelet Count: 221 10*3/uL (ref 150–400)
RBC: 4.39 MIL/uL (ref 4.22–5.81)
RDW: 12.7 % (ref 11.5–15.5)
WBC Count: 6.1 10*3/uL (ref 4.0–10.5)
nRBC: 0 % (ref 0.0–0.2)

## 2023-02-03 LAB — RETICULOCYTES
Immature Retic Fract: 7.5 % (ref 2.3–15.9)
RBC.: 4.42 MIL/uL (ref 4.22–5.81)
Retic Count, Absolute: 48.6 10*3/uL (ref 19.0–186.0)
Retic Ct Pct: 1.1 % (ref 0.4–3.1)

## 2023-02-03 LAB — IRON AND IRON BINDING CAPACITY (CC-WL,HP ONLY)
Iron: 61 ug/dL (ref 45–182)
Saturation Ratios: 19 % (ref 17.9–39.5)
TIBC: 328 ug/dL (ref 250–450)
UIBC: 267 ug/dL (ref 117–376)

## 2023-02-03 LAB — FERRITIN: Ferritin: 88 ng/mL (ref 24–336)

## 2023-02-03 NOTE — Progress Notes (Signed)
Hematology and Oncology Follow Up Visit  Adam Quinn [id number] 05-08-1956 67 y.o. 02/03/2023   Principle Diagnosis:  Iron deficiency anemia Erythropoietin deficiency anemia   Current Therapy:        IV iron as indicated   Interim History:  [name] is here today for follow-up. He is feeling much better since receiving IV iron. Hgb is back up to 13.6, MCV 93.  No blood loss noted. No bruising or petechiae.  No fever, chills, n/v, cough, rash, SOB, chest pain, palpitations, abdominal pain or changes in bowel or bladder habits.  No swelling or tenderness in his extremities.  Tingling in hands and feet comes and goes.  No falls or syncope.  Appetite and hydration are good. Weight is stable at 179 lbs.   ECOG Performance Status: 1 - Symptomatic but completely ambulatory  Medications:  Allergies as of 02/03/2023       Reactions   Aspirin Anaphylaxis, Hives   Shrimp [shellfish Allergy] Hives   Just shrimp        Medication List        Accurate as of February 03, 2023  1:32 PM. If you have any questions, ask your nurse or doctor.          Alavert D-12 Hour Allergy/Cong 5-120 MG tablet Generic drug: loratadine-pseudoephedrine Take 1 tablet by mouth at bedtime.   Carestart COVID-19 Home Test Kit Generic drug: COVID-19 At Home Antigen Test Use as directed   chlorhexidine 0.12 % solution Commonly known as: Peridex RINSE MOUTH WITH 15ML (1 CAPFUL) FOR 30 SECONDS MORNING AND EVENING AFTER TOOTHBRUSHING. EXPECTORATE AFTER RINSING, DO NOT SWALLOW   clopidogrel 75 MG tablet Commonly known as: PLAVIX Take 1 tablet (75 mg total) by mouth daily. Resume 1/8   clopidogrel 75 MG tablet Commonly known as: PLAVIX Take 1 tablet (75 mg total) by mouth daily.   dorzolamide 2 % ophthalmic solution Commonly known as: TRUSOPT Place 1 drop in both eyes twice a day   ELDERBERRY PO Take 1 tablet by mouth at bedtime. Gummy   FIBER PO Take 1 tablet by mouth daily. Miralax brand  fiber gummy.   FreeStyle Libre 2 Sensor Misc Apply sensor to body externally every 2 weeks as directed. To read blood sugar continuously.   gabapentin 300 MG capsule Commonly known as: NEURONTIN Take 1 capsule (300 mg total) by mouth daily.   ibuprofen 800 MG tablet Commonly known as: IBU TAKE 1 TABLET BY MOUTH EVERY 6 TO 8 HOURS AS NEEDED.   lansoprazole 30 MG capsule Commonly known as: PREVACID Take 1 capsule (30 mg total) by mouth 2 (two) times daily.   Lumigan 0.01 % Soln Generic drug: bimatoprost Instill 1 drop in both eyes at bedtime   Lumigan 0.01 % Soln Generic drug: bimatoprost Place 1 drop into both eyes at bedtime.   Melatonin 5 MG Chew Chew 5 mg by mouth at bedtime.   metFORMIN 500 MG 24 hr tablet Commonly known as: GLUCOPHAGE-XR Take 2 tablets (1,000 mg total) by mouth 2 (two) times daily.   penicillin v potassium 500 MG tablet Commonly known as: VEETID Take 1 tablet (500 mg total) by mouth 4 (four) times daily. Until gone   Pentips 31G X 5 MM Misc Generic drug: Insulin Pen Needle Use as directed with Tyler Aas   RA PROBIOTIC GUMMIES PO Take 1 capsule by mouth at bedtime.   sertraline 100 MG tablet Commonly known as: ZOLOFT Take 1 tablet (100 mg total) by mouth daily.  simvastatin 20 MG tablet Commonly known as: ZOCOR Take 1 tablet (20 mg total) by mouth daily.   sodium bicarbonate 650 MG tablet Take 1 tablet by mouth 2 times daily   tamsulosin 0.4 MG Caps capsule Commonly known as: FLOMAX Take 1 capsule (0.4 mg total) by mouth at bedtime.   Tyler Aas FlexTouch 200 UNIT/ML FlexTouch Pen Generic drug: insulin degludec Inject 30 Units into the skin daily. What changed:  when to take this additional instructions   Trulicity 1.5 0000000 Sopn Generic drug: Dulaglutide Inject under the skin once a week What changed:  how much to take how to take this when to take this   Trulicity 1.5 0000000 Sopn Generic drug: Dulaglutide Inject 1.5 mg  into the skin once a week. What changed: Another medication with the same name was changed. Make sure you understand how and when to take each.        Allergies:  Allergies  Allergen Reactions   Aspirin Anaphylaxis and Hives   Shrimp [Shellfish Allergy] Hives    Just shrimp     Past Medical History, Surgical history, Social history, and Family History were reviewed and updated.  Review of Systems: All other 10 point review of systems is negative.   Physical Exam:  vitals were not taken for this visit.   Wt Readings from Last 3 Encounters:  12/18/22 165 lb (74.8 kg)  12/05/22 174 lb 1.3 oz (79 kg)  11/28/22 176 lb 12.8 oz (80.2 kg)    Ocular: Sclerae unicteric, pupils equal, round and reactive to light Ear-nose-throat: Oropharynx clear, dentition fair Lymphatic: No cervical or supraclavicular adenopathy Lungs no rales or rhonchi, good excursion bilaterally Heart regular rate and rhythm, no murmur appreciated Abd soft, nontender, positive bowel sounds MSK no focal spinal tenderness, no joint edema Neuro: non-focal, well-oriented, appropriate affect Breasts: Deferred   Lab Results  Component Value Date   WBC 6.1 02/03/2023   HGB 13.6 02/03/2023   HCT 40.9 02/03/2023   MCV 93.2 02/03/2023   PLT 221 02/03/2023   Lab Results  Component Value Date   FERRITIN 198 01/05/2023   IRON 71 01/05/2023   TIBC 364 01/05/2023   UIBC 293 01/05/2023   IRONPCTSAT 20 01/05/2023   Lab Results  Component Value Date   RETICCTPCT 1.1 02/03/2023   RBC 4.42 02/03/2023   RBC 4.39 02/03/2023   No results found for: "KPAFRELGTCHN", "LAMBDASER", "KAPLAMBRATIO" No results found for: "IGGSERUM", "IGA", "IGMSERUM" No results found for: "TOTALPROTELP", "ALBUMINELP", "A1GS", "A2GS", "BETS", "BETA2SER", "GAMS", "MSPIKE", "SPEI"   Chemistry      Component Value Date/Time   NA 141 12/02/2022 0938   NA 140 05/26/2017 0856   K 3.9 12/02/2022 0938   K 4.2 05/26/2017 0856   CL 103  12/02/2022 0938   CO2 26 12/02/2022 0938   CO2 25 05/26/2017 0856   BUN 12 12/02/2022 0938   BUN 12.7 05/26/2017 0856   CREATININE 1.24 12/02/2022 0938   CREATININE 1.32 (H) 06/13/2022 1027   CREATININE 1.2 05/26/2017 0856      Component Value Date/Time   CALCIUM 9.0 12/02/2022 0938   CALCIUM 9.5 05/26/2017 0856   ALKPHOS 84 12/02/2022 0938   ALKPHOS 83 05/26/2017 0856   AST 11 12/02/2022 0938   AST 13 (L) 06/13/2022 1027   AST 16 05/26/2017 0856   ALT 15 12/02/2022 0938   ALT 15 06/13/2022 1027   ALT 22 05/26/2017 0856   BILITOT 0.3 12/02/2022 0938   BILITOT 0.2 (L) 06/13/2022 1027  BILITOT 0.28 05/26/2017 0856       Impression and Plan:  Adam Quinn is a very pleasant 67 yo caucasian gentleman with 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  Iron studies are pending.  Follow-up in 3 months.   Lottie Dawson, NP 3/12/20241:32 PM

## 2023-02-06 ENCOUNTER — Other Ambulatory Visit (HOSPITAL_BASED_OUTPATIENT_CLINIC_OR_DEPARTMENT_OTHER): Payer: Self-pay

## 2023-02-15 ENCOUNTER — Other Ambulatory Visit: Payer: Self-pay

## 2023-02-15 ENCOUNTER — Other Ambulatory Visit (HOSPITAL_BASED_OUTPATIENT_CLINIC_OR_DEPARTMENT_OTHER): Payer: Self-pay

## 2023-02-16 ENCOUNTER — Other Ambulatory Visit (HOSPITAL_BASED_OUTPATIENT_CLINIC_OR_DEPARTMENT_OTHER): Payer: Self-pay

## 2023-02-16 MED ORDER — METFORMIN HCL ER 500 MG PO TB24
1000.0000 mg | ORAL_TABLET | Freq: Two times a day (BID) | ORAL | 0 refills | Status: DC
  Filled 2023-02-16: qty 360, 90d supply, fill #0

## 2023-02-20 ENCOUNTER — Inpatient Hospital Stay: Payer: PPO

## 2023-02-20 ENCOUNTER — Inpatient Hospital Stay: Payer: PPO | Admitting: Family

## 2023-02-23 ENCOUNTER — Other Ambulatory Visit (HOSPITAL_BASED_OUTPATIENT_CLINIC_OR_DEPARTMENT_OTHER): Payer: Self-pay

## 2023-02-27 ENCOUNTER — Other Ambulatory Visit (HOSPITAL_BASED_OUTPATIENT_CLINIC_OR_DEPARTMENT_OTHER): Payer: Self-pay

## 2023-02-27 MED ORDER — TRESIBA FLEXTOUCH 200 UNIT/ML ~~LOC~~ SOPN
30.0000 [IU] | PEN_INJECTOR | Freq: Every day | SUBCUTANEOUS | 1 refills | Status: DC
  Filled 2023-02-27: qty 9, 60d supply, fill #0
  Filled 2023-05-05: qty 9, 60d supply, fill #1

## 2023-03-10 ENCOUNTER — Other Ambulatory Visit (HOSPITAL_BASED_OUTPATIENT_CLINIC_OR_DEPARTMENT_OTHER): Payer: Self-pay

## 2023-03-10 ENCOUNTER — Other Ambulatory Visit: Payer: Self-pay

## 2023-03-24 ENCOUNTER — Other Ambulatory Visit (HOSPITAL_BASED_OUTPATIENT_CLINIC_OR_DEPARTMENT_OTHER): Payer: Self-pay

## 2023-03-25 ENCOUNTER — Ambulatory Visit (INDEPENDENT_AMBULATORY_CARE_PROVIDER_SITE_OTHER): Payer: PPO

## 2023-03-25 ENCOUNTER — Other Ambulatory Visit (HOSPITAL_BASED_OUTPATIENT_CLINIC_OR_DEPARTMENT_OTHER): Payer: Self-pay

## 2023-03-25 ENCOUNTER — Ambulatory Visit
Admission: EM | Admit: 2023-03-25 | Discharge: 2023-03-25 | Disposition: A | Discharge status: Home / Self Care | Payer: PPO | Attending: Family Medicine | Admitting: Family Medicine

## 2023-03-25 ENCOUNTER — Encounter: Payer: Self-pay | Admitting: Emergency Medicine

## 2023-03-25 DIAGNOSIS — M79644 Pain in right finger(s): Secondary | ICD-10-CM

## 2023-03-25 DIAGNOSIS — Z09 Encounter for follow-up examination after completed treatment for conditions other than malignant neoplasm: Secondary | ICD-10-CM | POA: Diagnosis not present

## 2023-03-25 DIAGNOSIS — Z4789 Encounter for other orthopedic aftercare: Secondary | ICD-10-CM | POA: Diagnosis not present

## 2023-03-25 DIAGNOSIS — S63286A Dislocation of proximal interphalangeal joint of right little finger, initial encounter: Secondary | ICD-10-CM

## 2023-03-25 DIAGNOSIS — W19XXXA Unspecified fall, initial encounter: Secondary | ICD-10-CM

## 2023-03-25 DIAGNOSIS — S63259A Unspecified dislocation of unspecified finger, initial encounter: Secondary | ICD-10-CM | POA: Diagnosis not present

## 2023-03-25 DIAGNOSIS — M7989 Other specified soft tissue disorders: Secondary | ICD-10-CM | POA: Diagnosis not present

## 2023-03-25 MED ORDER — HYDROCODONE-ACETAMINOPHEN 5-325 MG PO TABS
1.0000 | ORAL_TABLET | Freq: Three times a day (TID) | ORAL | 0 refills | Status: DC | PRN
  Filled 2023-03-25: qty 15, 5d supply, fill #0

## 2023-03-25 NOTE — Consult Note (Signed)
     Procedures performed today:    Procedure: Reduction of right fifth dorsal PIP dislocation Risks, benefits, and alternatives explained and consent obtained. Time out conducted. Surface prepped with alcohol. 2 to 3 mL of lidocaine used in a digital block. Adequate anesthesia ensured. I palpated the 2 dislocation fragments, I hyperextended the dislocation at the proximal interphalangeal joint simultaneously applying axial traction, I then flexed at the PIP joint allowing the middle phalanx to slip over the proximal phalanx. Post reduction films obtained showed anatomic/near-anatomic alignment. Pt stable, aftercare and follow-up advised.  Independent interpretation of notes and tests performed by another provider:   Personally reviewed the prereduction films, there is a dorsal dislocation of the right fifth proximal interphalangeal joint.  No fractures.  Postreduction films personally reviewed and show anatomic reduction with good alignment.  No fractures.  Brief History, Exam, Impression, and Recommendations:    Dislocation of proximal interphalangeal joint of right little finger This is a pleasant and previously healthy 67 year old male, several days ago he injured his right fifth finger, it was treated conservatively at home and he did not present to a physician. He presented to urgent care where x-rays showed a dorsal dislocation of the right fifth proximal interphalangeal joint. He was neurovascularly intact distally. I was called by the urgent care provider for additional management, urgent care provider placed a digital block, I reduced the fracture, he will be splinted above and below the PIP, we will send in some hydrocodone for postprocedural pain and I would like to see him back in about a week for repeat x-rays.    ____________________________________________ Adam Quinn. Benjamin Stain, M.D., ABFM., CAQSM., AME. Primary Care and Sports Medicine [facility]  [facility]  Adjunct Professor of Family Medicine  [employer]  Restaurant manager, fast food

## 2023-03-25 NOTE — ED Triage Notes (Addendum)
[place] Walking back to the car in the dark he tripped over something in the parking lot. Hit his head and arm. Denies any neurological issues, numbness/tingling/weakness, nausea, vomiting, LOC, headaches. Only complaint is pinky finger on right hand. States it hurts to move it, unable to bend it, does appear bruised and swollen. Wife is a Engineer, civil (consulting) and splinted his finger at the time. Is on plavix, does have bruising to back of left hand patient states is getting better. Does have bruising to right side of forehead.

## 2023-03-25 NOTE — Assessment & Plan Note (Signed)
This is a pleasant and previously healthy 67 year old male, several days ago he injured his right fifth finger, it was treated conservatively at home and he did not present to a physician. He presented to urgent care where x-rays showed a dorsal dislocation of the right fifth proximal interphalangeal joint. He was neurovascularly intact distally. I was called by the urgent care provider for additional management, urgent care provider placed a digital block, I reduced the fracture, he will be splinted above and below the PIP, we will send in some hydrocodone for postprocedural pain and I would like to see him back in about a week for repeat x-rays.

## 2023-03-25 NOTE — Discharge Instructions (Signed)
Use ice and elevation to reduce pain and swelling Take hydrocodone as needed for severe pain Leave splint on at all times until you see Dr. Benjamin Stain in follow-up Make an appointment to see him 1 day next week

## 2023-03-25 NOTE — ED Provider Notes (Signed)
Adam Quinn [facility]    CSN: [id number] Arrival date & time: 03/25/23  1009      History   Chief Complaint Chief Complaint  Patient presents with   Finger Injury    HPI Adam Quinn is a 67 y.o. male.   HPI  Patient was vacationing in the [place] of [place] and was walking back to his car in the dark.  Tripped over a parking curb and injured his right hand.  His fifth finger is swollen and painful.  His wife put it in a splint for him.  He also bumped his head and has other bruises.  Nothing else is injured.  Happened 5 days ago.  Patient is on Plavix for recurrent TIA events.  He has a lot of bruising.  Past Medical History:  Diagnosis Date   Allergy    seasonal   Anxiety    Cataract    cataract surgery bilaterally resolved issues   Central scotoma    Chronic kidney disease    small protien showing uses Lisinopril for this   Diverticulitis    Diverticulosis    DM (diabetes mellitus) (HCC)    GERD (gastroesophageal reflux disease)    Glaucoma    History of colon polyps    History of hiatal hernia    History of kidney stones    x1   Iron deficiency anemia    Iron infusion 04/29/2019   Neuropathy    OSA on CPAP    Stroke (HCC)    Wears glasses    READING    Patient Active Problem List   Diagnosis Date Noted   Syncope 11/27/2022   Secondary membrane, obscuring vision, right 06/04/2022   Posterior vitreous detachment of 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   Lamellar macular hole, 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   Acute urinary retention 03/24/2022   Diverticulosis 03/24/2022   CKD stage IIIa (HCC) 03/24/2022   GIB (gastrointestinal bleeding) 03/23/2022   Lobar pneumonia (HCC) 03/23/2022   History of CVA (cerebrovascular accident) 03/23/2022   History of colonic polyps    Polyp of ascending colon    Polyp of rectum    Rectal bleeding    Acute blood loss anemia    Hematochezia    Left upper quadrant abdominal pain    Sigmoid diverticulitis 08/01/2021   Diabetic  polyneuropathy associated with type 2 diabetes mellitus (HCC) 08/01/2021   Acute lower gastrointestinal bleeding 07/31/2021   Cystoid macular edema, right eye 05/23/2021   Pain due to onychomycosis of toenails of both feet 12/21/2019   Diabetic neuropathy (HCC) 12/21/2019   Coagulation disorder (HCC) 12/21/2019   Stroke (HCC) 06/27/2019   Depression 06/27/2019   Elevated hemoglobin (HCC) 04/05/2018   Iron deficiency anemia due to chronic blood loss 05/26/2017   Malabsorption of iron 05/26/2017   HTN (hypertension) 03/30/2017   Hyperlipemia 03/30/2017   OSA on CPAP 03/30/2017   Snoring 07/08/2016   Uncontrolled REM sleep behavior disorder 07/08/2016   Lacunar infarct, acute (HCC) 06/26/2016   Numbness 06/26/2016   Occlusion and stenosis of vertebral artery 06/26/2016   Benign neoplasm of cecum 06/04/2015   Diverticulosis of colon without hemorrhage 06/04/2015   Internal hemorrhoids 06/04/2015   Gastritis and gastroduodenitis 06/04/2015   Microcytic anemia 04/13/2015   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   Open-angle glaucoma 01/17/2013   GERD without esophagitis 10/04/2012   DM2 (diabetes mellitus, type 2) (HCC) 03/16/2008   CONSTIPATION 03/16/2008    Past Surgical History:  Procedure Laterality Date   cataract  surgery     bilateral   COLONOSCOPY     COLONOSCOPY N/A 06/04/2015   Procedure: COLONOSCOPY;  Surgeon: Louis Meckel, MD;  Location: [facility];  Service: Endoscopy;  Laterality: N/A;   COLONOSCOPY WITH PROPOFOL N/A 05/09/2019   Procedure: COLONOSCOPY WITH PROPOFOL;  Surgeon: Sherrilyn Rist, MD;  Location: [facility];  Service: Gastroenterology;  Laterality: N/A;   COLONOSCOPY WITH PROPOFOL N/A 01/30/2022   Procedure: COLONOSCOPY WITH PROPOFOL;  Surgeon: Napoleon Form, MD;  Location: [facility];  Service: Endoscopy;  Laterality: N/A;   ESOPHAGOGASTRODUODENOSCOPY N/A 06/04/2015   Procedure: ESOPHAGOGASTRODUODENOSCOPY (EGD);  Surgeon: Louis Meckel, MD;  Location:  [facility];  Service: Endoscopy;  Laterality: N/A;   ESOPHAGOGASTRODUODENOSCOPY (EGD) WITH PROPOFOL N/A 08/02/2021   Procedure: ESOPHAGOGASTRODUODENOSCOPY (EGD) WITH PROPOFOL;  Surgeon: Napoleon Form, MD;  Location: [facility];  Service: Endoscopy;  Laterality: N/A;   POLYPECTOMY  01/30/2022   Procedure: POLYPECTOMY;  Surgeon: Napoleon Form, MD;  Location: [facility];  Service: Endoscopy;;   UPPER GASTROINTESTINAL ENDOSCOPY     WISDOM TOOTH EXTRACTION     with sedation       Home Medications    Prior to Admission medications   Medication Sig Start Date End Date Taking? Authorizing Provider  bimatoprost (LUMIGAN) 0.01 % SOLN Instill 1 drop in both eyes at bedtime 01/09/22  Yes   clopidogrel (PLAVIX) 75 MG tablet Take 1 tablet (75 mg total) by mouth daily. 12/16/22  Yes   dorzolamide (TRUSOPT) 2 % ophthalmic solution Place 1 drop in both eyes twice a day 09/05/22  Yes   Dulaglutide (TRULICITY) 1.5 MG/0.5ML SOPN Inject under the skin once a week 09/27/21  Yes   gabapentin (NEURONTIN) 300 MG capsule Take 1 capsule (300 mg total) by mouth daily. 11/03/22  Yes   insulin degludec (TRESIBA FLEXTOUCH) 200 UNIT/ML FlexTouch Pen Inject 30 Units into the skin daily. 02/27/23  Yes   lansoprazole (PREVACID) 30 MG capsule Take 1 capsule (30 mg total) by mouth 2 (two) times daily. 09/04/22  Yes   loratadine-pseudoephedrine (ALAVERT D-12 HOUR ALLERGY/CONG) 5-120 MG tablet Take 1 tablet by mouth at bedtime.   Yes [provider]  Melatonin 5 MG CHEW Chew 5 mg by mouth at bedtime.   Yes [provider]  metFORMIN (GLUCOPHAGE-XR) 500 MG 24 hr tablet Take 2 tablets (1,000 mg total) by mouth 2 (two) times daily. 02/16/23  Yes   Probiotic Product (RA PROBIOTIC GUMMIES PO) Take 1 capsule by mouth at bedtime.   Yes [provider]  sertraline (ZOLOFT) 100 MG tablet Take 1 tablet (100 mg total) by mouth daily. 01/21/23  Yes   simvastatin (ZOCOR) 20 MG tablet Take 1 tablet (20 mg  total) by mouth daily. 01/21/23  Yes   sodium bicarbonate 650 MG tablet Take 1 tablet by mouth 2 times daily 01/28/23  Yes   tamsulosin (FLOMAX) 0.4 MG CAPS capsule Take 1 capsule (0.4 mg total) by mouth at bedtime. 08/07/22  Yes   bimatoprost (LUMIGAN) 0.01 % SOLN Place 1 drop into both eyes at bedtime. 01/12/23     Continuous Blood Gluc Sensor (FREESTYLE LIBRE 2 SENSOR) MISC Apply sensor to body externally every 2 weeks as directed. To read blood sugar continuously. 11/25/22   Ollen Bowl, MD  COVID-19 At Home Antigen Test [facility] COVID-19 HOME TEST) KIT Use as directed 11/14/21   Andrez Grime, [facility]  Dulaglutide (TRULICITY) 1.5 MG/0.5ML SOPN Inject 1.5 mg into the skin once  a week. 12/30/22     ELDERBERRY PO Take 1 tablet by mouth at bedtime. Gummy    [provider]  FIBER PO Take 1 tablet by mouth daily. Miralax brand fiber gummy.    [provider]  ibuprofen (IBU) 800 MG tablet TAKE 1 TABLET BY MOUTH EVERY 6 TO 8 HOURS AS NEEDED. 01/28/23     Insulin Pen Needle 31G X 5 MM MISC Use as directed with Evaristo Bury 12/12/22       Family History Family History  Problem Relation Age of Onset   Colon polyps Father    Lung cancer Father    Diabetes Mother    Dementia Mother    Diabetes Sister        x 2   Stroke Maternal Grandmother    Stroke Paternal Grandfather    Colon cancer Neg Hx    Rectal cancer Neg Hx    Stomach cancer Neg Hx    Pancreatic cancer Neg Hx     Social History Social History   Tobacco Use   Smoking status: Former    Types: Cigarettes    Quit date: 10/04/1997    Years since quitting: 25.4   Smokeless tobacco: Never  Vaping Use   Vaping Use: Never used  Substance Use Topics   Alcohol use: No   Drug use: No     Allergies   Aspirin and Shrimp [shellfish allergy]   Review of Systems Review of Systems See HPI  Physical Exam Triage Vital Signs ED Triage Vitals [03/25/23 1024]  Enc Vitals Group     BP 130/76     Pulse Rate (!) 104      Resp 16     Temp 97.6 F (36.4 C)     Temp Source Oral     SpO2 95 %     Weight      Height      Head Circumference      Peak Flow      Pain Score 3     Pain Loc      Pain Edu?      Excl. in GC?    No data found.  Updated Vital Signs BP 130/76 (BP Location: Right Arm)   Pulse (!) 104   Temp 97.6 F (36.4 C) (Oral)   Resp 16   SpO2 95%       Physical Exam Constitutional:      General: He is not in acute distress.    Appearance: He is well-developed.  HENT:     Head: Normocephalic and atraumatic.  Eyes:     Conjunctiva/sclera: Conjunctivae normal.     Pupils: Pupils are equal, round, and reactive to light.  Cardiovascular:     Rate and Rhythm: Normal rate.  Pulmonary:     Effort: Pulmonary effort is normal. No respiratory distress.  Abdominal:     General: There is no distension.     Palpations: Abdomen is soft.  Musculoskeletal:        General: Swelling, tenderness, deformity and signs of injury present. Normal range of motion.     Cervical back: Normal range of motion.     Comments: Fifth finger is shortened, very swollen, tender at the PIP joint  Skin:    General: Skin is warm and dry.  Neurological:     Mental Status: He is alert.      UC Treatments / Results  Labs (all labs ordered are listed, but only abnormal results are displayed)  Labs Reviewed - No data to display  EKG   Radiology DG Finger Little Right  Result Date: 03/25/2023 CLINICAL DATA:  Fall, pain and bruising EXAM: RIGHT LITTLE FINGER 2+V COMPARISON:  None Available. FINDINGS: Right fifth digit PIP joint dislocation dorsally. No associated acute osseous finding or fracture. Soft tissue swelling noted. No other joint abnormality. IMPRESSION: Right fifth digit PIP joint dislocation. Electronically Signed   By: Judie Petit.  Shick M.D.   On: 03/25/2023 10:47    Procedures Procedures   Discussed reduction with patient.  Patient requires a digital block.  With his consent the right fifth finger was  prepped with Betadine.  Digital block was placed with a 27-gauge needle.  A mixture of 50% 2% lidocaine and future send 05% bupivacaine.  Patient tolerated well and digital block was placed successfully  I consulted with Dr. Benjamin Stain of sports medicine who kindly came into the office and reduce the finger.  His procedure note is documented separately  Medications Ordered in UC Medications - No data to display  Initial Impression / Assessment and Plan / UC Course  I have reviewed the triage vital signs and the nursing notes.  Pertinent labs & imaging results that were available during my care of the patient were reviewed by me and considered in my medical decision making (see chart for details).     Final Clinical Impressions(s) / UC Diagnoses   Final diagnoses:  Finger dislocation, initial encounter  Finger pain, right  Fall, initial encounter     Discharge Instructions      Use ice and elevation to reduce pain and swelling Take hydrocodone as needed for severe pain Leave splint on at all times until you see Dr. Benjamin Stain in follow-up Make an appointment to see him 1 day next week     ED Prescriptions   None    PDMP not reviewed this encounter.   Eustace Moore, MD 03/25/23 [id number]

## 2023-03-26 ENCOUNTER — Telehealth: Payer: Self-pay | Admitting: Emergency Medicine

## 2023-03-26 NOTE — Telephone Encounter (Signed)
LMTRC.  Advised if doing well to disregard the call.  Any questions or concerns, don't hesitate to contact the office. 

## 2023-03-27 ENCOUNTER — Other Ambulatory Visit (HOSPITAL_BASED_OUTPATIENT_CLINIC_OR_DEPARTMENT_OTHER): Payer: Self-pay

## 2023-04-02 ENCOUNTER — Ambulatory Visit (INDEPENDENT_AMBULATORY_CARE_PROVIDER_SITE_OTHER): Payer: PPO

## 2023-04-02 ENCOUNTER — Ambulatory Visit (INDEPENDENT_AMBULATORY_CARE_PROVIDER_SITE_OTHER): Payer: PPO | Admitting: Sports Medicine

## 2023-04-02 DIAGNOSIS — S63286D Dislocation of proximal interphalangeal joint of right little finger, subsequent encounter: Secondary | ICD-10-CM | POA: Diagnosis not present

## 2023-04-02 DIAGNOSIS — S63254A Unspecified dislocation of right ring finger, initial encounter: Secondary | ICD-10-CM | POA: Diagnosis not present

## 2023-04-02 NOTE — Progress Notes (Signed)
    Procedures performed today:    None.  Independent interpretation of notes and tests performed by another provider:   None.  Brief History, Exam, Impression, and Recommendations:    Dislocation of proximal interphalangeal joint of right little finger Adam Quinn returns, he is a pleasant 67 year old male, he had a right fifth PIP dorsal dislocation with delayed presentation several days after injury, he presented to urgent care and I was called for reduction. The dislocation was reduced under he digital block, he was splinted and returns today 1 week post reduction, the reduction is stable, he does have expected stiffness at the PIP. We will buddy tape the fourth and fifth digits, I would like an updated x-ray, and I would like him to start some hand physical therapy.    ____________________________________________ Ihor Austin. Benjamin Stain, M.D., ABFM., CAQSM., AME. Primary Care and Sports Medicine [facility]  Adjunct Professor of Family Medicine  [employer]  Restaurant manager, fast food

## 2023-04-02 NOTE — Assessment & Plan Note (Signed)
Adam Quinn returns, he is a pleasant 67 year old male, he had a right fifth PIP dorsal dislocation with delayed presentation several days after injury, he presented to urgent care and I was called for reduction. The dislocation was reduced under he digital block, he was splinted and returns today 1 week post reduction, the reduction is stable, he does have expected stiffness at the PIP. We will buddy tape the fourth and fifth digits, I would like an updated x-ray, and I would like him to start some hand physical therapy.

## 2023-04-23 DIAGNOSIS — G4733 Obstructive sleep apnea (adult) (pediatric): Secondary | ICD-10-CM | POA: Diagnosis not present

## 2023-04-27 ENCOUNTER — Other Ambulatory Visit (HOSPITAL_BASED_OUTPATIENT_CLINIC_OR_DEPARTMENT_OTHER): Payer: Self-pay

## 2023-04-27 ENCOUNTER — Encounter: Payer: Self-pay | Admitting: Family

## 2023-04-27 MED ORDER — GABAPENTIN 300 MG PO CAPS
300.0000 mg | ORAL_CAPSULE | Freq: Every day | ORAL | 0 refills | Status: DC
  Filled 2023-04-27: qty 90, 90d supply, fill #0

## 2023-05-01 ENCOUNTER — Other Ambulatory Visit: Payer: Self-pay

## 2023-05-05 ENCOUNTER — Other Ambulatory Visit (HOSPITAL_BASED_OUTPATIENT_CLINIC_OR_DEPARTMENT_OTHER): Payer: Self-pay

## 2023-05-05 ENCOUNTER — Other Ambulatory Visit: Payer: Self-pay

## 2023-05-05 MED ORDER — FREESTYLE LIBRE 2 SENSOR MISC
1 refills | Status: DC
  Filled 2023-05-05: qty 6, 84d supply, fill #0
  Filled 2023-08-03: qty 6, 84d supply, fill #1

## 2023-05-05 MED ORDER — SODIUM BICARBONATE 650 MG PO TABS
650.0000 mg | ORAL_TABLET | Freq: Two times a day (BID) | ORAL | 1 refills | Status: DC
  Filled 2023-07-27: qty 180, 90d supply, fill #0
  Filled 2023-10-21: qty 180, 90d supply, fill #1

## 2023-05-06 ENCOUNTER — Encounter: Payer: Self-pay | Admitting: Family

## 2023-05-06 ENCOUNTER — Other Ambulatory Visit (HOSPITAL_BASED_OUTPATIENT_CLINIC_OR_DEPARTMENT_OTHER): Payer: Self-pay

## 2023-05-06 ENCOUNTER — Inpatient Hospital Stay (HOSPITAL_BASED_OUTPATIENT_CLINIC_OR_DEPARTMENT_OTHER): Payer: PPO | Admitting: Family

## 2023-05-06 ENCOUNTER — Inpatient Hospital Stay: Payer: PPO | Attending: Hematology & Oncology

## 2023-05-06 VITALS — BP 140/64 | HR 96 | Temp 97.6°F | Resp 18 | Wt 180.0 lb

## 2023-05-06 DIAGNOSIS — K909 Intestinal malabsorption, unspecified: Secondary | ICD-10-CM | POA: Insufficient documentation

## 2023-05-06 DIAGNOSIS — D500 Iron deficiency anemia secondary to blood loss (chronic): Secondary | ICD-10-CM

## 2023-05-06 DIAGNOSIS — K922 Gastrointestinal hemorrhage, unspecified: Secondary | ICD-10-CM | POA: Diagnosis not present

## 2023-05-06 LAB — CBC WITH DIFFERENTIAL (CANCER CENTER ONLY)
Abs Immature Granulocytes: 0.02 10*3/uL (ref 0.00–0.07)
Basophils Absolute: 0.1 10*3/uL (ref 0.0–0.1)
Basophils Relative: 1 %
Eosinophils Absolute: 0.2 10*3/uL (ref 0.0–0.5)
Eosinophils Relative: 3 %
HCT: 40.9 % (ref 39.0–52.0)
Hemoglobin: 13.3 g/dL (ref 13.0–17.0)
Immature Granulocytes: 0 %
Lymphocytes Relative: 16 %
Lymphs Abs: 1.2 10*3/uL (ref 0.7–4.0)
MCH: 30.9 pg (ref 26.0–34.0)
MCHC: 32.5 g/dL (ref 30.0–36.0)
MCV: 95.1 fL (ref 80.0–100.0)
Monocytes Absolute: 0.6 10*3/uL (ref 0.1–1.0)
Monocytes Relative: 8 %
Neutro Abs: 5.3 10*3/uL (ref 1.7–7.7)
Neutrophils Relative %: 72 %
Platelet Count: 243 10*3/uL (ref 150–400)
RBC: 4.3 MIL/uL (ref 4.22–5.81)
RDW: 13.6 % (ref 11.5–15.5)
WBC Count: 7.4 10*3/uL (ref 4.0–10.5)
nRBC: 0 % (ref 0.0–0.2)

## 2023-05-06 LAB — IRON AND IRON BINDING CAPACITY (CC-WL,HP ONLY)
Iron: 52 ug/dL (ref 45–182)
Saturation Ratios: 15 % — ABNORMAL LOW (ref 17.9–39.5)
TIBC: 353 ug/dL (ref 250–450)
UIBC: 301 ug/dL (ref 117–376)

## 2023-05-06 LAB — RETICULOCYTES
Immature Retic Fract: 10.1 % (ref 2.3–15.9)
RBC.: 4.27 MIL/uL (ref 4.22–5.81)
Retic Count, Absolute: 55.9 10*3/uL (ref 19.0–186.0)
Retic Ct Pct: 1.3 % (ref 0.4–3.1)

## 2023-05-06 LAB — FERRITIN: Ferritin: 97 ng/mL (ref 24–336)

## 2023-05-06 NOTE — Progress Notes (Signed)
Hematology and Oncology Follow Up Visit  [name] [id number] 1956-10-21 67 y.o. 05/06/2023   Principle Diagnosis:  Iron deficiency anemia Erythropoietin deficiency anemia   Current Therapy:        IV iron as indicated   Interim History:  [name] is here today for follow-up. He is doing well and has no new complaints at this time.  He still has episodes of dizziness with vertigo.  He did [place] a [place] the beach and dislocated his right pinky finger. He is seeing Dr. Karie Schwalbe for this and notes that it is much better. No syncope reported.  No fever, chills, n/v, cough, rash, SOB, chest pain, palpitations, abdominal pain or changes in bowel or bladder habits.  No blood loss noted. He bruises easily on his arms but not in excess. He is on Plavix.  Appetite is good and he is doing his best to increase his daily hydration. Weight is stable at 180 lbs.   ECOG Performance Status: 1 - Symptomatic but completely ambulatory  Medications:  Allergies as of 05/06/2023       Reactions   Aspirin Anaphylaxis, Hives   Shrimp [shellfish Allergy] Hives   Just shrimp        Medication List        Accurate as of May 06, 2023  1:03 PM. If you have any questions, ask your nurse or doctor.          Alavert D-12 Hour Allergy/Cong 5-120 MG tablet Generic drug: loratadine-pseudoephedrine Take 1 tablet by mouth at bedtime.   Carestart COVID-19 Home Test Kit Generic drug: COVID-19 At Home Antigen Test Use as directed   clopidogrel 75 MG tablet Commonly known as: PLAVIX Take 1 tablet (75 mg total) by mouth daily.   dorzolamide 2 % ophthalmic solution Commonly known as: TRUSOPT Place 1 drop in both eyes twice a day   ELDERBERRY PO Take 1 tablet by mouth at bedtime. Gummy   FIBER PO Take 1 tablet by mouth daily. Miralax brand fiber gummy.   FreeStyle Libre 2 Sensor Misc Apply sensor to body externally every 2 weeks as directed. To read blood sugar continuously.   FreeStyle  Libre 2 Sensor Misc Use as directed to check blood sugar and change every 14 days   gabapentin 300 MG capsule Commonly known as: NEURONTIN Take 1 capsule (300 mg total) by mouth daily.   HYDROcodone-acetaminophen 5-325 MG tablet Commonly known as: NORCO/VICODIN Take 1 tablet by mouth every 8 (eight) hours as needed for moderate pain.   ibuprofen 800 MG tablet Commonly known as: IBU TAKE 1 TABLET BY MOUTH EVERY 6 TO 8 HOURS AS NEEDED.   lansoprazole 30 MG capsule Commonly known as: PREVACID Take 1 capsule (30 mg total) by mouth 2 (two) times daily.   Lumigan 0.01 % Soln Generic drug: bimatoprost Instill 1 drop in both eyes at bedtime   Lumigan 0.01 % Soln Generic drug: bimatoprost Place 1 drop into both eyes at bedtime.   Melatonin 5 MG Chew Chew 5 mg by mouth at bedtime.   metFORMIN 500 MG 24 hr tablet Commonly known as: GLUCOPHAGE-XR Take 2 tablets (1,000 mg total) by mouth 2 (two) times daily.   RA PROBIOTIC GUMMIES PO Take 1 capsule by mouth at bedtime.   sertraline 100 MG tablet Commonly known as: ZOLOFT Take 1 tablet (100 mg total) by mouth daily.   simvastatin 20 MG tablet Commonly known as: ZOCOR Take 1 tablet (20 mg total) by mouth daily.  sodium bicarbonate 650 MG tablet Take 1 tablet by mouth 2 times daily   tamsulosin 0.4 MG Caps capsule Commonly known as: FLOMAX Take 1 capsule (0.4 mg total) by mouth at bedtime.   TechLite Pen Needles 31G X 5 MM Misc Generic drug: Insulin Pen Needle Use as directed with Westley Hummer FlexTouch 200 UNIT/ML FlexTouch Pen Generic drug: insulin degludec Inject 30 Units into the skin daily.   Trulicity 1.5 MG/0.5ML Sopn Generic drug: Dulaglutide Inject under the skin once a week   Trulicity 1.5 MG/0.5ML Sopn Generic drug: Dulaglutide Inject 1.5 mg into the skin once a week.        Allergies:  Allergies  Allergen Reactions   Aspirin Anaphylaxis and Hives   Shrimp [Shellfish Allergy] Hives    Just  shrimp     Past Medical History, Surgical history, Social history, and Family History were reviewed and updated.  Review of Systems: All other 10 point review of systems is negative.   Physical Exam:  vitals were not taken for this visit.   Wt Readings from Last 3 Encounters:  02/03/23 179 lb (81.2 kg)  12/18/22 165 lb (74.8 kg)  12/05/22 174 lb 1.3 oz (79 kg)    Ocular: Sclerae unicteric, pupils equal, round and reactive to light Ear-nose-throat: Oropharynx clear, dentition fair Lymphatic: No cervical or supraclavicular adenopathy Lungs no rales or rhonchi, good excursion bilaterally Heart regular rate and rhythm, no murmur appreciated Abd soft, nontender, positive bowel sounds MSK no focal spinal tenderness, no joint edema Neuro: non-focal, well-oriented, appropriate affect Breasts: Deferred   Lab Results  Component Value Date   WBC 7.4 05/06/2023   HGB 13.3 05/06/2023   HCT 40.9 05/06/2023   MCV 95.1 05/06/2023   PLT 243 05/06/2023   Lab Results  Component Value Date   FERRITIN 88 02/03/2023   IRON 61 02/03/2023   TIBC 328 02/03/2023   UIBC 267 02/03/2023   IRONPCTSAT 19 02/03/2023   Lab Results  Component Value Date   RETICCTPCT 1.3 05/06/2023   RBC 4.27 05/06/2023   RBC 4.30 05/06/2023   No results found for: "KPAFRELGTCHN", "LAMBDASER", "KAPLAMBRATIO" No results found for: "IGGSERUM", "IGA", "IGMSERUM" No results found for: "TOTALPROTELP", "ALBUMINELP", "A1GS", "A2GS", "BETS", "BETA2SER", "GAMS", "MSPIKE", "SPEI"   Chemistry      Component Value Date/Time   NA 141 12/02/2022 0938   NA 140 05/26/2017 0856   K 3.9 12/02/2022 0938   K 4.2 05/26/2017 0856   CL 103 12/02/2022 0938   CO2 26 12/02/2022 0938   CO2 25 05/26/2017 0856   BUN 12 12/02/2022 0938   BUN 12.7 05/26/2017 0856   CREATININE 1.24 12/02/2022 0938   CREATININE 1.32 (H) 06/13/2022 1027   CREATININE 1.2 05/26/2017 0856      Component Value Date/Time   CALCIUM 9.0 12/02/2022 0938    CALCIUM 9.5 05/26/2017 0856   ALKPHOS 84 12/02/2022 0938   ALKPHOS 83 05/26/2017 0856   AST 11 12/02/2022 0938   AST 13 (L) 06/13/2022 1027   AST 16 05/26/2017 0856   ALT 15 12/02/2022 0938   ALT 15 06/13/2022 1027   ALT 22 05/26/2017 0856   BILITOT 0.3 12/02/2022 0938   BILITOT 0.2 (L) 06/13/2022 1027   BILITOT 0.28 05/26/2017 0856       Impression and Plan: Mr. 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  Iron studies are pending.  Follow-up  in 3 months.   Eileen Stanford, NP 6/12/20241:03 PM

## 2023-05-11 ENCOUNTER — Ambulatory Visit (INDEPENDENT_AMBULATORY_CARE_PROVIDER_SITE_OTHER): Payer: PPO | Admitting: Sports Medicine

## 2023-05-11 DIAGNOSIS — S63286D Dislocation of proximal interphalangeal joint of right little finger, subsequent encounter: Secondary | ICD-10-CM

## 2023-05-11 NOTE — Progress Notes (Signed)
    Procedures performed today:    None.  Independent interpretation of notes and tests performed by another provider:   None.  Brief History, Exam, Impression, and Recommendations:    Dislocation of proximal interphalangeal joint of right little finger Adam Quinn is a very pleasant 67 year old male, I treated him for a right fifth PIP dorsal dislocation with delayed presentation several days after the injury. I reduced the dislocation under digital block, I splinted him and saw him 1 week afterwards, the reduction was stable. He had some stiffness in the PIP, we discontinued splinting and buddy tape the fourth and fifth digits, we got updated x-rays at that time that showed persistence of the reduction. Today he returns 6 weeks later, he is doing okay, he still has some swelling but good motion and good strength, minimal tenderness. 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 I will have him do some hand therapy at home for the next 6 weeks, if this does not completely improve we will consider PIP injection.    ____________________________________________ Ihor Austin. Benjamin Stain, M.D., ABFM., CAQSM., AME. Primary Care and Sports Medicine [facility]  Adjunct Professor of Family Medicine  [employer] [facility] [employer] [employer]  Restaurant manager, fast food

## 2023-05-11 NOTE — Assessment & Plan Note (Signed)
Adam Quinn is a very pleasant 67 year old male, I treated him for a right fifth PIP dorsal dislocation with delayed presentation several days after the injury. I reduced the dislocation under digital block, I splinted him and saw him 1 week afterwards, the reduction was stable. He had some stiffness in the PIP, we discontinued splinting and buddy tape the fourth and fifth digits, we got updated x-rays at that time that showed persistence of the reduction. Today he returns 6 weeks later, he is doing okay, he still has some swelling but good motion and good strength, minimal tenderness. 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 I will have him do some hand therapy at home for the next 6 weeks, if this does not completely improve we will consider PIP injection.

## 2023-05-12 ENCOUNTER — Other Ambulatory Visit (HOSPITAL_BASED_OUTPATIENT_CLINIC_OR_DEPARTMENT_OTHER): Payer: Self-pay

## 2023-05-12 DIAGNOSIS — K219 Gastro-esophageal reflux disease without esophagitis: Secondary | ICD-10-CM | POA: Diagnosis not present

## 2023-05-12 DIAGNOSIS — D509 Iron deficiency anemia, unspecified: Secondary | ICD-10-CM | POA: Diagnosis not present

## 2023-05-12 DIAGNOSIS — F39 Unspecified mood [affective] disorder: Secondary | ICD-10-CM | POA: Diagnosis not present

## 2023-05-12 DIAGNOSIS — N1831 Chronic kidney disease, stage 3a: Secondary | ICD-10-CM | POA: Diagnosis not present

## 2023-05-12 DIAGNOSIS — E1140 Type 2 diabetes mellitus with diabetic neuropathy, unspecified: Secondary | ICD-10-CM | POA: Diagnosis not present

## 2023-05-12 DIAGNOSIS — E1121 Type 2 diabetes mellitus with diabetic nephropathy: Secondary | ICD-10-CM | POA: Diagnosis not present

## 2023-05-12 DIAGNOSIS — B351 Tinea unguium: Secondary | ICD-10-CM | POA: Diagnosis not present

## 2023-05-12 DIAGNOSIS — R42 Dizziness and giddiness: Secondary | ICD-10-CM | POA: Diagnosis not present

## 2023-05-12 DIAGNOSIS — G629 Polyneuropathy, unspecified: Secondary | ICD-10-CM | POA: Diagnosis not present

## 2023-05-12 DIAGNOSIS — I6930 Unspecified sequelae of cerebral infarction: Secondary | ICD-10-CM | POA: Diagnosis not present

## 2023-05-12 DIAGNOSIS — E785 Hyperlipidemia, unspecified: Secondary | ICD-10-CM | POA: Diagnosis not present

## 2023-05-12 DIAGNOSIS — E1122 Type 2 diabetes mellitus with diabetic chronic kidney disease: Secondary | ICD-10-CM | POA: Diagnosis not present

## 2023-05-12 DIAGNOSIS — Z794 Long term (current) use of insulin: Secondary | ICD-10-CM | POA: Diagnosis not present

## 2023-05-12 MED ORDER — METFORMIN HCL ER 500 MG PO TB24
1000.0000 mg | ORAL_TABLET | Freq: Two times a day (BID) | ORAL | 1 refills | Status: DC
  Filled 2023-05-12: qty 360, 90d supply, fill #0

## 2023-05-12 MED ORDER — MECLIZINE HCL 25 MG PO CHEW: CHEWABLE_TABLET | ORAL | 1 refills | Status: AC

## 2023-05-13 ENCOUNTER — Other Ambulatory Visit (HOSPITAL_BASED_OUTPATIENT_CLINIC_OR_DEPARTMENT_OTHER): Payer: Self-pay

## 2023-05-15 ENCOUNTER — Inpatient Hospital Stay: Payer: PPO

## 2023-05-15 VITALS — BP 133/68 | HR 97 | Temp 97.8°F | Resp 18

## 2023-05-15 DIAGNOSIS — D500 Iron deficiency anemia secondary to blood loss (chronic): Secondary | ICD-10-CM | POA: Diagnosis not present

## 2023-05-15 MED ORDER — SODIUM CHLORIDE 0.9 % IV SOLN: INTRAVENOUS | Status: DC

## 2023-05-15 MED ORDER — SODIUM CHLORIDE 0.9 % IV SOLN
510.0000 mg | Freq: Once | INTRAVENOUS | Status: AC
  Administered 2023-05-15: 510 mg via INTRAVENOUS
  Filled 2023-05-15: qty 17

## 2023-05-15 NOTE — Patient Instructions (Signed)

## 2023-05-15 NOTE — Progress Notes (Signed)
Patient refused to wait 30 minutes post infusion. Released stable and ASX. 

## 2023-05-18 ENCOUNTER — Ambulatory Visit: Payer: PPO | Admitting: Podiatry

## 2023-05-18 ENCOUNTER — Encounter: Payer: Self-pay | Admitting: Podiatry

## 2023-05-18 DIAGNOSIS — M79674 Pain in right toe(s): Secondary | ICD-10-CM

## 2023-05-18 DIAGNOSIS — M79675 Pain in left toe(s): Secondary | ICD-10-CM

## 2023-05-18 DIAGNOSIS — E1142 Type 2 diabetes mellitus with diabetic polyneuropathy: Secondary | ICD-10-CM

## 2023-05-18 DIAGNOSIS — B351 Tinea unguium: Secondary | ICD-10-CM

## 2023-05-18 DIAGNOSIS — D689 Coagulation defect, unspecified: Secondary | ICD-10-CM

## 2023-05-18 NOTE — Progress Notes (Signed)
This patient returns to my office for at risk foot care.  This patient requires this care by a professional since this patient will be at risk due to having diabetic neuropathy anc coagulation defect.  This patient is unable to cut nails himself since the patient cannot reach his nails.These nails are painful walking and wearing shoes.  This patient presents for at risk foot care today.  General Appearance  Alert, conversant and in no acute stress.  Vascular  Dorsalis pedis and posterior tibial  pulses are palpable  bilaterally.  Capillary return is within normal limits  bilaterally. Temperature is within normal limits  bilaterally.  Neurologic  Senn-Weinstein monofilament wire test absent  bilaterally. Muscle power within normal limits bilaterally.  Nails Thick disfigured discolored nails with subungual debris  Hallux nails  B/L. No evidence of bacterial infection or drainage bilaterally.  Orthopedic  No limitations of motion  feet .  No crepitus or effusions noted.  Hammer toes 2-4  B/L. Hallux malleus  B/L.  Cavus foot  B/L.  Skin  normotropic skin with no porokeratosis noted bilaterally.  No signs of infections or ulcers noted.     Onychomycosis  Pain in right toes  Pain in left toes  Consent was obtained for treatment procedures.   Mechanical debridement of hallux nails  bilaterally performed with a nail nipper.  Filed with dremel without incident.    Return office visit   4   months                  Told patient to return for periodic foot care and evaluation due to potential at risk complications.   Zethan Alfieri DPM  

## 2023-05-21 DIAGNOSIS — H401132 Primary open-angle glaucoma, bilateral, moderate stage: Secondary | ICD-10-CM | POA: Diagnosis not present

## 2023-05-22 ENCOUNTER — Inpatient Hospital Stay: Payer: PPO

## 2023-05-22 VITALS — BP 121/63 | HR 92 | Temp 98.0°F | Resp 16

## 2023-05-22 DIAGNOSIS — D500 Iron deficiency anemia secondary to blood loss (chronic): Secondary | ICD-10-CM | POA: Diagnosis not present

## 2023-05-22 MED ORDER — SODIUM CHLORIDE 0.9 % IV SOLN
510.0000 mg | Freq: Once | INTRAVENOUS | Status: AC
  Administered 2023-05-22: 510 mg via INTRAVENOUS
  Filled 2023-05-22: qty 17

## 2023-05-22 MED ORDER — SODIUM CHLORIDE 0.9 % IV SOLN: INTRAVENOUS | Status: DC

## 2023-05-22 NOTE — Progress Notes (Signed)
Patient refused to wait 30 minutes post infusion. Released stable and ASX. 

## 2023-05-22 NOTE — Patient Instructions (Signed)

## 2023-05-25 ENCOUNTER — Other Ambulatory Visit (HOSPITAL_BASED_OUTPATIENT_CLINIC_OR_DEPARTMENT_OTHER): Payer: Self-pay

## 2023-05-29 ENCOUNTER — Other Ambulatory Visit (HOSPITAL_BASED_OUTPATIENT_CLINIC_OR_DEPARTMENT_OTHER): Payer: Self-pay

## 2023-05-29 MED ORDER — LANSOPRAZOLE 30 MG PO CPDR
30.0000 mg | DELAYED_RELEASE_CAPSULE | Freq: Two times a day (BID) | ORAL | 2 refills | Status: DC
  Filled 2023-05-29: qty 180, 90d supply, fill #0
  Filled 2023-08-31: qty 180, 90d supply, fill #1
  Filled 2023-11-28: qty 180, 90d supply, fill #2

## 2023-06-01 ENCOUNTER — Other Ambulatory Visit (HOSPITAL_BASED_OUTPATIENT_CLINIC_OR_DEPARTMENT_OTHER): Payer: Self-pay

## 2023-06-09 ENCOUNTER — Other Ambulatory Visit (HOSPITAL_BASED_OUTPATIENT_CLINIC_OR_DEPARTMENT_OTHER): Payer: Self-pay

## 2023-06-09 MED ORDER — CLOPIDOGREL BISULFATE 75 MG PO TABS
75.0000 mg | ORAL_TABLET | Freq: Every day | ORAL | 1 refills | Status: DC
  Filled 2023-06-09: qty 90, 90d supply, fill #0
  Filled 2023-09-09: qty 90, 90d supply, fill #1

## 2023-06-09 MED ORDER — PEN NEEDLES 31G X 5 MM MISC
1.0000 | Freq: Every day | 1 refills | Status: AC
  Filled 2023-06-09: qty 100, 100d supply, fill #0
  Filled 2023-09-27: qty 100, 100d supply, fill #1

## 2023-06-22 ENCOUNTER — Other Ambulatory Visit (HOSPITAL_BASED_OUTPATIENT_CLINIC_OR_DEPARTMENT_OTHER): Payer: Self-pay

## 2023-06-22 MED ORDER — TRULICITY 1.5 MG/0.5ML ~~LOC~~ SOAJ
1.5000 mg | SUBCUTANEOUS | 5 refills | Status: DC
  Filled 2023-06-22: qty 2, 28d supply, fill #0
  Filled 2023-07-16: qty 2, 28d supply, fill #1
  Filled 2023-08-13: qty 2, 28d supply, fill #2
  Filled 2023-09-09: qty 2, 28d supply, fill #3
  Filled 2023-10-08: qty 2, 28d supply, fill #4
  Filled 2023-11-02: qty 2, 28d supply, fill #5
  Filled 2023-12-02: qty 2, 28d supply, fill #6
  Filled 2024-01-01: qty 2, 28d supply, fill #7
  Filled 2024-01-27: qty 2, 28d supply, fill #8
  Filled 2024-03-10 – 2024-03-26 (×3): qty 2, 28d supply, fill #9
  Filled 2024-04-22: qty 2, 28d supply, fill #10
  Filled 2024-05-23: qty 2, 28d supply, fill #11
  Filled 2024-06-15: qty 2, 28d supply, fill #12

## 2023-07-09 ENCOUNTER — Other Ambulatory Visit (HOSPITAL_BASED_OUTPATIENT_CLINIC_OR_DEPARTMENT_OTHER): Payer: Self-pay

## 2023-07-09 MED ORDER — TRESIBA FLEXTOUCH 200 UNIT/ML ~~LOC~~ SOPN
30.0000 [IU] | PEN_INJECTOR | Freq: Every day | SUBCUTANEOUS | 5 refills | Status: DC
  Filled 2023-07-09: qty 9, 60d supply, fill #0
  Filled 2023-09-09: qty 9, 60d supply, fill #1
  Filled 2023-11-09: qty 9, 60d supply, fill #2
  Filled 2024-01-07: qty 9, 60d supply, fill #3
  Filled 2024-03-07: qty 9, 60d supply, fill #4
  Filled 2024-05-01: qty 9, 60d supply, fill #5

## 2023-07-09 MED ORDER — SIMVASTATIN 20 MG PO TABS
20.0000 mg | ORAL_TABLET | Freq: Every day | ORAL | 3 refills | Status: DC
  Filled 2023-07-09: qty 90, 90d supply, fill #0
  Filled 2023-10-14: qty 90, 90d supply, fill #1
  Filled 2024-01-07: qty 90, 90d supply, fill #2
  Filled 2024-04-07: qty 90, 90d supply, fill #3

## 2023-07-16 ENCOUNTER — Other Ambulatory Visit (HOSPITAL_BASED_OUTPATIENT_CLINIC_OR_DEPARTMENT_OTHER): Payer: Self-pay

## 2023-07-16 ENCOUNTER — Other Ambulatory Visit: Payer: Self-pay

## 2023-07-16 DIAGNOSIS — K922 Gastrointestinal hemorrhage, unspecified: Secondary | ICD-10-CM

## 2023-07-16 DIAGNOSIS — D500 Iron deficiency anemia secondary to blood loss (chronic): Secondary | ICD-10-CM

## 2023-07-17 ENCOUNTER — Other Ambulatory Visit: Payer: PPO

## 2023-07-17 ENCOUNTER — Telehealth: Payer: Self-pay | Admitting: Nurse Practitioner

## 2023-07-17 DIAGNOSIS — D500 Iron deficiency anemia secondary to blood loss (chronic): Secondary | ICD-10-CM

## 2023-07-17 DIAGNOSIS — K922 Gastrointestinal hemorrhage, unspecified: Secondary | ICD-10-CM | POA: Diagnosis not present

## 2023-07-17 LAB — CBC WITH DIFFERENTIAL/PLATELET
Basophils Absolute: 0 10*3/uL (ref 0.0–0.1)
Basophils Relative: 0.5 % (ref 0.0–3.0)
Eosinophils Absolute: 0.1 10*3/uL (ref 0.0–0.7)
Eosinophils Relative: 1.8 % (ref 0.0–5.0)
HCT: 39.7 % (ref 39.0–52.0)
Hemoglobin: 13 g/dL (ref 13.0–17.0)
Lymphocytes Relative: 18.4 % (ref 12.0–46.0)
Lymphs Abs: 1.4 10*3/uL (ref 0.7–4.0)
MCHC: 32.8 g/dL (ref 30.0–36.0)
MCV: 96.2 fl (ref 78.0–100.0)
Monocytes Absolute: 0.6 10*3/uL (ref 0.1–1.0)
Monocytes Relative: 8 % (ref 3.0–12.0)
Neutro Abs: 5.3 10*3/uL (ref 1.4–7.7)
Neutrophils Relative %: 71.3 % (ref 43.0–77.0)
Platelets: 254 10*3/uL (ref 150.0–400.0)
RBC: 4.13 Mil/uL — ABNORMAL LOW (ref 4.22–5.81)
RDW: 14.1 % (ref 11.5–15.5)
WBC: 7.5 10*3/uL (ref 4.0–10.5)

## 2023-07-17 LAB — BASIC METABOLIC PANEL
BUN: 18 mg/dL (ref 6–23)
CO2: 26 mEq/L (ref 19–32)
Calcium: 9.6 mg/dL (ref 8.4–10.5)
Chloride: 104 mEq/L (ref 96–112)
Creatinine, Ser: 1.43 mg/dL (ref 0.40–1.50)
GFR: 50.23 mL/min — ABNORMAL LOW (ref >60.00)
Glucose, Bld: 198 mg/dL — ABNORMAL HIGH (ref 70–99)
Potassium: 5 mEq/L (ref 3.5–5.1)
Sodium: 140 mEq/L (ref 135–145)

## 2023-07-20 ENCOUNTER — Other Ambulatory Visit (HOSPITAL_BASED_OUTPATIENT_CLINIC_OR_DEPARTMENT_OTHER): Payer: Self-pay

## 2023-07-20 ENCOUNTER — Other Ambulatory Visit: Payer: Self-pay

## 2023-07-20 MED ORDER — DORZOLAMIDE HCL 2 % OP SOLN
1.0000 [drp] | Freq: Two times a day (BID) | OPHTHALMIC | 3 refills | Status: DC
  Filled 2023-07-20: qty 10, 50d supply, fill #0
  Filled 2023-09-27: qty 10, 50d supply, fill #1
  Filled 2023-11-16 (×2): qty 10, 50d supply, fill #2
  Filled 2024-01-01: qty 10, 50d supply, fill #3

## 2023-07-20 MED ORDER — SERTRALINE HCL 100 MG PO TABS
100.0000 mg | ORAL_TABLET | Freq: Every day | ORAL | 2 refills | Status: DC
  Filled 2023-07-20: qty 90, 90d supply, fill #0
  Filled 2023-10-21: qty 90, 90d supply, fill #1
  Filled 2024-01-18: qty 90, 90d supply, fill #2

## 2023-07-22 ENCOUNTER — Other Ambulatory Visit (HOSPITAL_BASED_OUTPATIENT_CLINIC_OR_DEPARTMENT_OTHER): Payer: Self-pay

## 2023-07-27 ENCOUNTER — Other Ambulatory Visit (HOSPITAL_BASED_OUTPATIENT_CLINIC_OR_DEPARTMENT_OTHER): Payer: Self-pay

## 2023-07-27 ENCOUNTER — Encounter: Payer: Self-pay | Admitting: Family

## 2023-07-28 ENCOUNTER — Other Ambulatory Visit (HOSPITAL_BASED_OUTPATIENT_CLINIC_OR_DEPARTMENT_OTHER): Payer: Self-pay

## 2023-07-28 ENCOUNTER — Other Ambulatory Visit: Payer: Self-pay

## 2023-07-28 MED ORDER — GABAPENTIN 300 MG PO CAPS
300.0000 mg | ORAL_CAPSULE | Freq: Every day | ORAL | 2 refills | Status: DC
  Filled 2023-07-28: qty 90, 90d supply, fill #0
  Filled 2023-10-21: qty 90, 90d supply, fill #1
  Filled 2024-01-18: qty 90, 90d supply, fill #2

## 2023-08-04 ENCOUNTER — Other Ambulatory Visit (HOSPITAL_BASED_OUTPATIENT_CLINIC_OR_DEPARTMENT_OTHER): Payer: Self-pay

## 2023-08-04 ENCOUNTER — Encounter: Payer: Self-pay | Admitting: Family

## 2023-08-04 ENCOUNTER — Inpatient Hospital Stay: Payer: PPO | Attending: Hematology & Oncology

## 2023-08-04 ENCOUNTER — Inpatient Hospital Stay: Payer: PPO | Admitting: Family

## 2023-08-04 VITALS — BP 124/63 | HR 104 | Temp 97.9°F | Resp 18 | Wt 186.8 lb

## 2023-08-04 DIAGNOSIS — D500 Iron deficiency anemia secondary to blood loss (chronic): Secondary | ICD-10-CM | POA: Diagnosis not present

## 2023-08-04 DIAGNOSIS — K909 Intestinal malabsorption, unspecified: Secondary | ICD-10-CM | POA: Insufficient documentation

## 2023-08-04 DIAGNOSIS — K922 Gastrointestinal hemorrhage, unspecified: Secondary | ICD-10-CM | POA: Insufficient documentation

## 2023-08-04 DIAGNOSIS — G4733 Obstructive sleep apnea (adult) (pediatric): Secondary | ICD-10-CM | POA: Diagnosis not present

## 2023-08-04 LAB — CBC WITH DIFFERENTIAL (CANCER CENTER ONLY)
Abs Immature Granulocytes: 0.02 10*3/uL (ref 0.00–0.07)
Basophils Absolute: 0.1 10*3/uL (ref 0.0–0.1)
Basophils Relative: 1 %
Eosinophils Absolute: 0.2 10*3/uL (ref 0.0–0.5)
Eosinophils Relative: 2 %
HCT: 38.7 % — ABNORMAL LOW (ref 39.0–52.0)
Hemoglobin: 12.9 g/dL — ABNORMAL LOW (ref 13.0–17.0)
Immature Granulocytes: 0 %
Lymphocytes Relative: 16 %
Lymphs Abs: 1.2 10*3/uL (ref 0.7–4.0)
MCH: 32.5 pg (ref 26.0–34.0)
MCHC: 33.3 g/dL (ref 30.0–36.0)
MCV: 97.5 fL (ref 80.0–100.0)
Monocytes Absolute: 0.6 10*3/uL (ref 0.1–1.0)
Monocytes Relative: 8 %
Neutro Abs: 5.5 10*3/uL (ref 1.7–7.7)
Neutrophils Relative %: 73 %
Platelet Count: 219 10*3/uL (ref 150–400)
RBC: 3.97 MIL/uL — ABNORMAL LOW (ref 4.22–5.81)
RDW: 14 % (ref 11.5–15.5)
WBC Count: 7.5 10*3/uL (ref 4.0–10.5)
nRBC: 0 % (ref 0.0–0.2)

## 2023-08-04 LAB — FERRITIN: Ferritin: 169 ng/mL (ref 24–336)

## 2023-08-04 LAB — IRON AND IRON BINDING CAPACITY (CC-WL,HP ONLY)
Iron: 56 ug/dL (ref 45–182)
Saturation Ratios: 16 % — ABNORMAL LOW (ref 17.9–39.5)
TIBC: 344 ug/dL (ref 250–450)
UIBC: 288 ug/dL (ref 117–376)

## 2023-08-04 LAB — RETICULOCYTES
Immature Retic Fract: 12.5 % (ref 2.3–15.9)
RBC.: 3.97 MIL/uL — ABNORMAL LOW (ref 4.22–5.81)
Retic Count, Absolute: 85 10*3/uL (ref 19.0–186.0)
Retic Ct Pct: 2.1 % (ref 0.4–3.1)

## 2023-08-04 NOTE — Progress Notes (Signed)
Hematology and Oncology Follow Up Visit  Adam Quinn [id number] 03-28-1956 67 y.o. 08/04/2023   Principle Diagnosis:  Iron deficiency anemia Erythropoietin deficiency anemia   Current Therapy:        IV iron as indicated               Interim History:  Adam Quinn is here today for follow-up. He is doing well but does have some fatigue at times.  He states that he recently had a bout with diarrhea and noted some bright red blood. He feels this may have been a flare of diverticulitis caused by eating chips and salsa a couple days in a row. He held his Plavix for a couple days and has not had any other issues.  No other blood loss noted. No abnormal bruising, no petechiae.  No fever, chills, n/v, cough, rash, dizziness, chest pain, palpitations, abdominal pain or changes in bowel or bladder habit.  No swelling in his extremities.  Numbness and tingling in the hands and feet unchanged from baseline.  No falls or syncope  reported. He is considering getting a walking stick to help with balance.  Appetite and hydration are good. Weight is stable at 186 lbs.   ECOG Performance Status: 1 - Symptomatic but completely ambulatory  Medications:  Allergies as of 08/04/2023       Reactions   Aspirin Anaphylaxis, Hives   Shrimp [shellfish Allergy] Hives   Just shrimp        Medication List        Accurate as of August 04, 2023  1:18 PM. If you have any questions, ask your nurse or doctor.          Alavert D-12 Hour Allergy/Cong 5-120 MG tablet Generic drug: loratadine-pseudoephedrine Take 1 tablet by mouth at bedtime.   Carestart COVID-19 Home Test Kit Generic drug: COVID-19 At Home Antigen Test Use as directed   clopidogrel 75 MG tablet Commonly known as: PLAVIX Take 1 tablet (75 mg total) by mouth daily.   dorzolamide 2 % ophthalmic solution Commonly known as: TRUSOPT Place 1 drop in both eyes twice a day   dorzolamide 2 % ophthalmic solution Commonly known as:  TRUSOPT Place 1 drop into both eyes 2 (two) times daily.   ELDERBERRY PO Take 1 tablet by mouth at bedtime. Gummy   FIBER PO Take 1 tablet by mouth daily. Miralax brand fiber gummy.   FreeStyle Libre 2 Sensor Misc Apply sensor to body externally every 2 weeks as directed. To read blood sugar continuously.   FreeStyle Libre 2 Sensor Misc Use as directed to check blood sugar and change every 14 days   gabapentin 300 MG capsule Commonly known as: NEURONTIN Take 1 capsule (300 mg total) by mouth daily.   gabapentin 300 MG capsule Commonly known as: NEURONTIN Take 1 capsule (300 mg total) by mouth daily.   HYDROcodone-acetaminophen 5-325 MG tablet Commonly known as: NORCO/VICODIN Take 1 tablet by mouth every 8 (eight) hours as needed for moderate pain.   ibuprofen 800 MG tablet Commonly known as: IBU TAKE 1 TABLET BY MOUTH EVERY 6 TO 8 HOURS AS NEEDED.   lansoprazole 30 MG capsule Commonly known as: PREVACID Take 1 capsule (30 mg total) by mouth 2 (two) times daily.   Lumigan 0.01 % Soln Generic drug: bimatoprost Instill 1 drop in both eyes at bedtime   Lumigan 0.01 % Soln Generic drug: bimatoprost Place 1 drop into both eyes at bedtime.   Meclizine HCl 25  MG Chew 1 tablet as needed Orally every 12 hrs   Melatonin 5 MG Chew Chew 5 mg by mouth at bedtime.   metFORMIN 500 MG 24 hr tablet Commonly known as: GLUCOPHAGE-XR Take 2 tablets (1,000 mg total) by mouth 2 (two) times daily.   RA PROBIOTIC GUMMIES PO Take 1 capsule by mouth at bedtime.   sertraline 100 MG tablet Commonly known as: ZOLOFT Take 1 tablet (100 mg total) by mouth daily.   simvastatin 20 MG tablet Commonly known as: ZOCOR Take 1 tablet (20 mg total) by mouth daily.   sodium bicarbonate 650 MG tablet Take 1 tablet by mouth 2 times daily   tamsulosin 0.4 MG Caps capsule Commonly known as: FLOMAX Take 1 capsule (0.4 mg total) by mouth at bedtime.   TechLite Pen Needles 31G X 5 MM  Misc Generic drug: Insulin Pen Needle Use 1 needle to inject Tresiba under the skin once daily as directed.   Evaristo Bury FlexTouch 200 UNIT/ML FlexTouch Pen Generic drug: insulin degludec Inject 30 Units into the skin daily.   Trulicity 1.5 MG/0.5ML Sopn Generic drug: Dulaglutide Inject under the skin once a week   Trulicity 1.5 MG/0.5ML Sopn Generic drug: Dulaglutide Inject 1.5 mg into the skin once a week.        Allergies:  Allergies  Allergen Reactions   Aspirin Anaphylaxis and Hives   Shrimp [Shellfish Allergy] Hives    Just shrimp     Past Medical History, Surgical history, Social history, and Family History were reviewed and updated.  Review of Systems: All other 10 point review of systems is negative.   Physical Exam:  vitals were not taken for this visit.   Wt Readings from Last 3 Encounters:  05/06/23 180 lb 0.6 oz (81.7 kg)  02/03/23 179 lb (81.2 kg)  12/18/22 165 lb (74.8 kg)    Ocular: Sclerae unicteric, pupils equal, round and reactive to light Ear-nose-throat: Oropharynx clear, dentition fair Lymphatic: No cervical or supraclavicular adenopathy Lungs no rales or rhonchi, good excursion bilaterally Heart regular rate and rhythm, no murmur appreciated Abd soft, nontender, positive bowel sounds MSK no focal spinal tenderness, no joint edema Neuro: non-focal, well-oriented, appropriate affect Breasts: Deferred   Lab Results  Component Value Date   WBC 7.5 08/04/2023   HGB 12.9 (L) 08/04/2023   HCT 38.7 (L) 08/04/2023   MCV 97.5 08/04/2023   PLT 219 08/04/2023   Lab Results  Component Value Date   FERRITIN 97 05/06/2023   IRON 52 05/06/2023   TIBC 353 05/06/2023   UIBC 301 05/06/2023   IRONPCTSAT 15 (L) 05/06/2023   Lab Results  Component Value Date   RETICCTPCT 2.1 08/04/2023   RBC 3.97 (L) 08/04/2023   RBC 3.97 (L) 08/04/2023   No results found for: "KPAFRELGTCHN", "LAMBDASER", "KAPLAMBRATIO" No results found for: "IGGSERUM", "IGA",  "IGMSERUM" No results found for: "TOTALPROTELP", "ALBUMINELP", "A1GS", "A2GS", "BETS", "BETA2SER", "GAMS", "MSPIKE", "SPEI"   Chemistry      Component Value Date/Time   NA 140 07/17/2023 1136   NA 140 05/26/2017 0856   K 5.0 07/17/2023 1136   K 4.2 05/26/2017 0856   CL 104 07/17/2023 1136   CO2 26 07/17/2023 1136   CO2 25 05/26/2017 0856   BUN 18 07/17/2023 1136   BUN 12.7 05/26/2017 0856   CREATININE 1.43 07/17/2023 1136   CREATININE 1.32 (H) 06/13/2022 1027   CREATININE 1.2 05/26/2017 0856      Component Value Date/Time   CALCIUM 9.6 07/17/2023 1136  CALCIUM 9.5 05/26/2017 0856   ALKPHOS 84 12/02/2022 0938   ALKPHOS 83 05/26/2017 0856   AST 11 12/02/2022 0938   AST 13 (L) 06/13/2022 1027   AST 16 05/26/2017 0856   ALT 15 12/02/2022 0938   ALT 15 06/13/2022 1027   ALT 22 05/26/2017 0856   BILITOT 0.3 12/02/2022 0938   BILITOT 0.2 (L) 06/13/2022 1027   BILITOT 0.28 05/26/2017 0856       Impression and Plan: Mr. 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  Iron studies are pending.  Follow-up in 3 months.   Eileen Stanford, NP 9/10/20241:18 PM

## 2023-08-07 ENCOUNTER — Other Ambulatory Visit: Payer: PPO

## 2023-08-07 ENCOUNTER — Ambulatory Visit: Payer: PPO | Admitting: Family

## 2023-08-07 ENCOUNTER — Other Ambulatory Visit (HOSPITAL_BASED_OUTPATIENT_CLINIC_OR_DEPARTMENT_OTHER): Payer: Self-pay

## 2023-08-07 DIAGNOSIS — R338 Other retention of urine: Secondary | ICD-10-CM | POA: Diagnosis not present

## 2023-08-07 DIAGNOSIS — N401 Enlarged prostate with lower urinary tract symptoms: Secondary | ICD-10-CM | POA: Diagnosis not present

## 2023-08-07 MED ORDER — TAMSULOSIN HCL 0.4 MG PO CAPS
0.4000 mg | ORAL_CAPSULE | Freq: Every day | ORAL | 3 refills | Status: DC
  Filled 2023-08-07: qty 90, 90d supply, fill #0
  Filled 2023-11-02: qty 90, 90d supply, fill #1
  Filled 2024-02-01: qty 90, 90d supply, fill #2
  Filled 2024-05-01: qty 90, 90d supply, fill #3

## 2023-08-10 ENCOUNTER — Other Ambulatory Visit (HOSPITAL_BASED_OUTPATIENT_CLINIC_OR_DEPARTMENT_OTHER): Payer: Self-pay

## 2023-08-10 MED ORDER — METFORMIN HCL ER 500 MG PO TB24
1000.0000 mg | ORAL_TABLET | Freq: Two times a day (BID) | ORAL | 1 refills | Status: DC
  Filled 2023-08-10: qty 180, 45d supply, fill #0
  Filled 2023-09-27: qty 180, 45d supply, fill #1

## 2023-08-13 ENCOUNTER — Other Ambulatory Visit (HOSPITAL_BASED_OUTPATIENT_CLINIC_OR_DEPARTMENT_OTHER): Payer: Self-pay

## 2023-08-19 ENCOUNTER — Encounter: Payer: Self-pay | Admitting: Podiatry

## 2023-08-19 ENCOUNTER — Ambulatory Visit: Payer: PPO | Admitting: Podiatry

## 2023-08-19 DIAGNOSIS — M79675 Pain in left toe(s): Secondary | ICD-10-CM | POA: Diagnosis not present

## 2023-08-19 DIAGNOSIS — M79674 Pain in right toe(s): Secondary | ICD-10-CM

## 2023-08-19 DIAGNOSIS — B351 Tinea unguium: Secondary | ICD-10-CM

## 2023-08-19 DIAGNOSIS — E1142 Type 2 diabetes mellitus with diabetic polyneuropathy: Secondary | ICD-10-CM | POA: Diagnosis not present

## 2023-08-19 NOTE — Progress Notes (Signed)
This patient returns to my office for at risk foot care.  This patient requires this care by a professional since this patient will be at risk due to having diabetic neuropathy anc coagulation defect.  This patient is unable to cut nails himself since the patient cannot reach his nails.These nails are painful walking and wearing shoes.  This patient presents for at risk foot care today.  General Appearance  Alert, conversant and in no acute stress.  Vascular  Dorsalis pedis and posterior tibial  pulses are palpable  bilaterally.  Capillary return is within normal limits  bilaterally. Temperature is within normal limits  bilaterally.  Neurologic  Senn-Weinstein monofilament wire test absent  bilaterally. Muscle power within normal limits bilaterally.  Nails Thick disfigured discolored nails with subungual debris  Hallux nails  B/L. No evidence of bacterial infection or drainage bilaterally.  Orthopedic  No limitations of motion  feet .  No crepitus or effusions noted.  Hammer toes 2-4  B/L. Hallux malleus  B/L.  Cavus foot  B/L.  Skin  normotropic skin with no porokeratosis noted bilaterally.  No signs of infections or ulcers noted.     Onychomycosis  Pain in right toes  Pain in left toes  Consent was obtained for treatment procedures.   Mechanical debridement of hallux nails  bilaterally performed with a nail nipper.  Filed with dremel without incident.    Return office visit   4   months                  Told patient to return for periodic foot care and evaluation due to potential at risk complications.   Helane Gunther DPM

## 2023-08-20 ENCOUNTER — Inpatient Hospital Stay: Payer: PPO

## 2023-08-20 VITALS — BP 130/68 | HR 93 | Temp 97.6°F | Resp 19

## 2023-08-20 DIAGNOSIS — D500 Iron deficiency anemia secondary to blood loss (chronic): Secondary | ICD-10-CM | POA: Diagnosis not present

## 2023-08-20 DIAGNOSIS — D582 Other hemoglobinopathies: Secondary | ICD-10-CM

## 2023-08-20 MED ORDER — SODIUM CHLORIDE 0.9 % IV SOLN: Freq: Once | INTRAVENOUS | Status: AC

## 2023-08-20 MED ORDER — SODIUM CHLORIDE 0.9 % IV SOLN
510.0000 mg | Freq: Once | INTRAVENOUS | Status: AC
  Administered 2023-08-20: 510 mg via INTRAVENOUS
  Filled 2023-08-20: qty 510

## 2023-08-20 NOTE — Patient Instructions (Signed)
 Ferumoxytol Injection What is this medication? FERUMOXYTOL (FER ue MOX i tol) treats low levels of iron in your body (iron deficiency anemia). Iron is a mineral that plays an important role in making red blood cells, which carry oxygen from your lungs to the rest of your body. This medicine may be used for other purposes; ask your health care provider or pharmacist if you have questions. COMMON BRAND NAME(S): Feraheme What should I tell my care team before I take this medication? They need to know if you have any of these conditions: Anemia not caused by low iron levels High levels of iron in the blood Magnetic resonance imaging (MRI) test scheduled An unusual or allergic reaction to iron, other medications, foods, dyes, or preservatives Pregnant or trying to get pregnant Breastfeeding How should I use this medication? This medication is injected into a vein. It is given by your care team in a hospital or clinic setting. Talk to your care team the use of this medication in children. Special care may be needed. Overdosage: If you think you have taken too much of this medicine contact a poison control center or emergency room at once. NOTE: This medicine is only for you. Do not share this medicine with others. What if I miss a dose? It is important not to miss your dose. Call your care team if you are unable to keep an appointment. What may interact with this medication? Other iron products This list may not describe all possible interactions. Give your health care provider a list of all the medicines, herbs, non-prescription drugs, or dietary supplements you use. Also tell them if you smoke, drink alcohol, or use illegal drugs. Some items may interact with your medicine. What should I watch for while using this medication? Visit your care team regularly. Tell your care team if your symptoms do not start to get better or if they get worse. You may need blood work done while you are taking this  medication. You may need to follow a special diet. Talk to your care team. Foods that contain iron include: whole grains/cereals, dried fruits, beans, or peas, leafy green vegetables, and organ meats (liver, kidney). What side effects may I notice from receiving this medication? Side effects that you should report to your care team as soon as possible: Allergic reactions--skin rash, itching, hives, swelling of the face, lips, tongue, or throat Low blood pressure--dizziness, feeling faint or lightheaded, blurry vision Shortness of breath Side effects that usually do not require medical attention (report to your care team if they continue or are bothersome): Flushing Headache Joint pain Muscle pain Nausea Pain, redness, or irritation at injection site This list may not describe all possible side effects. Call your doctor for medical advice about side effects. You may report side effects to FDA at 1-800-FDA-1088. Where should I keep my medication? This medication is given in a hospital or clinic. It will not be stored at home. NOTE: This sheet is a summary. It may not cover all possible information. If you have questions about this medicine, talk to your doctor, pharmacist, or health care provider.  2024 Elsevier/Gold Standard (2023-04-17 00:00:00)

## 2023-08-24 ENCOUNTER — Other Ambulatory Visit: Payer: Self-pay

## 2023-08-27 ENCOUNTER — Inpatient Hospital Stay: Payer: PPO | Attending: Hematology & Oncology

## 2023-08-27 VITALS — BP 134/73 | HR 88 | Temp 98.1°F | Resp 17

## 2023-08-27 DIAGNOSIS — K922 Gastrointestinal hemorrhage, unspecified: Secondary | ICD-10-CM | POA: Insufficient documentation

## 2023-08-27 DIAGNOSIS — K909 Intestinal malabsorption, unspecified: Secondary | ICD-10-CM | POA: Insufficient documentation

## 2023-08-27 DIAGNOSIS — D500 Iron deficiency anemia secondary to blood loss (chronic): Secondary | ICD-10-CM | POA: Diagnosis not present

## 2023-08-27 MED ORDER — SODIUM CHLORIDE 0.9 % IV SOLN: INTRAVENOUS | Status: DC

## 2023-08-27 MED ORDER — SODIUM CHLORIDE 0.9 % IV SOLN
510.0000 mg | Freq: Once | INTRAVENOUS | Status: AC
  Administered 2023-08-27: 510 mg via INTRAVENOUS
  Filled 2023-08-27: qty 510

## 2023-08-27 NOTE — Progress Notes (Signed)
Pt declined to stay for post infusion observation period. Pt stated he has tolerated medication multiple times prior without difficulty. Pt aware to call clinic with any questions or concerns. Pt verbalized understanding and had no further questions.   

## 2023-08-27 NOTE — Patient Instructions (Signed)
 Ferumoxytol Injection What is this medication? FERUMOXYTOL (FER ue MOX i tol) treats low levels of iron in your body (iron deficiency anemia). Iron is a mineral that plays an important role in making red blood cells, which carry oxygen from your lungs to the rest of your body. This medicine may be used for other purposes; ask your health care provider or pharmacist if you have questions. COMMON BRAND NAME(S): Feraheme What should I tell my care team before I take this medication? They need to know if you have any of these conditions: Anemia not caused by low iron levels High levels of iron in the blood Magnetic resonance imaging (MRI) test scheduled An unusual or allergic reaction to iron, other medications, foods, dyes, or preservatives Pregnant or trying to get pregnant Breastfeeding How should I use this medication? This medication is injected into a vein. It is given by your care team in a hospital or clinic setting. Talk to your care team the use of this medication in children. Special care may be needed. Overdosage: If you think you have taken too much of this medicine contact a poison control center or emergency room at once. NOTE: This medicine is only for you. Do not share this medicine with others. What if I miss a dose? It is important not to miss your dose. Call your care team if you are unable to keep an appointment. What may interact with this medication? Other iron products This list may not describe all possible interactions. Give your health care provider a list of all the medicines, herbs, non-prescription drugs, or dietary supplements you use. Also tell them if you smoke, drink alcohol, or use illegal drugs. Some items may interact with your medicine. What should I watch for while using this medication? Visit your care team regularly. Tell your care team if your symptoms do not start to get better or if they get worse. You may need blood work done while you are taking this  medication. You may need to follow a special diet. Talk to your care team. Foods that contain iron include: whole grains/cereals, dried fruits, beans, or peas, leafy green vegetables, and organ meats (liver, kidney). What side effects may I notice from receiving this medication? Side effects that you should report to your care team as soon as possible: Allergic reactions--skin rash, itching, hives, swelling of the face, lips, tongue, or throat Low blood pressure--dizziness, feeling faint or lightheaded, blurry vision Shortness of breath Side effects that usually do not require medical attention (report to your care team if they continue or are bothersome): Flushing Headache Joint pain Muscle pain Nausea Pain, redness, or irritation at injection site This list may not describe all possible side effects. Call your doctor for medical advice about side effects. You may report side effects to FDA at 1-800-FDA-1088. Where should I keep my medication? This medication is given in a hospital or clinic. It will not be stored at home. NOTE: This sheet is a summary. It may not cover all possible information. If you have questions about this medicine, talk to your doctor, pharmacist, or health care provider.  2024 Elsevier/Gold Standard (2023-04-17 00:00:00)

## 2023-08-31 ENCOUNTER — Other Ambulatory Visit (HOSPITAL_BASED_OUTPATIENT_CLINIC_OR_DEPARTMENT_OTHER): Payer: Self-pay

## 2023-09-21 DIAGNOSIS — H18593 Other hereditary corneal dystrophies, bilateral: Secondary | ICD-10-CM | POA: Diagnosis not present

## 2023-09-21 DIAGNOSIS — H401132 Primary open-angle glaucoma, bilateral, moderate stage: Secondary | ICD-10-CM | POA: Diagnosis not present

## 2023-09-21 DIAGNOSIS — B0052 Herpesviral keratitis: Secondary | ICD-10-CM | POA: Diagnosis not present

## 2023-09-27 ENCOUNTER — Other Ambulatory Visit (HOSPITAL_BASED_OUTPATIENT_CLINIC_OR_DEPARTMENT_OTHER): Payer: Self-pay

## 2023-09-28 ENCOUNTER — Other Ambulatory Visit: Payer: Self-pay

## 2023-09-28 ENCOUNTER — Other Ambulatory Visit (HOSPITAL_BASED_OUTPATIENT_CLINIC_OR_DEPARTMENT_OTHER): Payer: Self-pay

## 2023-10-14 ENCOUNTER — Other Ambulatory Visit: Payer: Self-pay

## 2023-10-14 ENCOUNTER — Other Ambulatory Visit (HOSPITAL_BASED_OUTPATIENT_CLINIC_OR_DEPARTMENT_OTHER): Payer: Self-pay

## 2023-10-15 ENCOUNTER — Other Ambulatory Visit (HOSPITAL_BASED_OUTPATIENT_CLINIC_OR_DEPARTMENT_OTHER): Payer: Self-pay

## 2023-10-15 MED ORDER — FREESTYLE LIBRE 2 SENSOR MISC
1.0000 | 1 refills | Status: DC
Start: 2023-10-15 — End: 2024-03-28
  Filled 2023-10-15: qty 6, 84d supply, fill #0
  Filled 2024-01-01: qty 6, 84d supply, fill #1

## 2023-10-16 ENCOUNTER — Other Ambulatory Visit (HOSPITAL_BASED_OUTPATIENT_CLINIC_OR_DEPARTMENT_OTHER): Payer: Self-pay

## 2023-10-21 ENCOUNTER — Other Ambulatory Visit: Payer: Self-pay

## 2023-10-21 ENCOUNTER — Other Ambulatory Visit (HOSPITAL_BASED_OUTPATIENT_CLINIC_OR_DEPARTMENT_OTHER): Payer: Self-pay

## 2023-10-21 ENCOUNTER — Encounter: Payer: Self-pay | Admitting: Family

## 2023-11-02 ENCOUNTER — Other Ambulatory Visit (HOSPITAL_BASED_OUTPATIENT_CLINIC_OR_DEPARTMENT_OTHER): Payer: Self-pay

## 2023-11-02 ENCOUNTER — Other Ambulatory Visit: Payer: Self-pay

## 2023-11-02 MED ORDER — CLOPIDOGREL BISULFATE 75 MG PO TABS
75.0000 mg | ORAL_TABLET | Freq: Every day | ORAL | 1 refills | Status: DC
  Filled 2023-11-02 – 2023-12-09 (×2): qty 90, 90d supply, fill #0
  Filled 2024-03-02: qty 90, 90d supply, fill #1

## 2023-11-03 ENCOUNTER — Encounter: Payer: Self-pay | Admitting: Medical Oncology

## 2023-11-03 ENCOUNTER — Inpatient Hospital Stay: Payer: PPO | Admitting: Medical Oncology

## 2023-11-03 ENCOUNTER — Inpatient Hospital Stay: Payer: PPO | Attending: Hematology & Oncology

## 2023-11-03 VITALS — BP 119/69 | HR 80 | Temp 98.6°F | Resp 18 | Ht 68.11 in | Wt 186.0 lb

## 2023-11-03 DIAGNOSIS — K909 Intestinal malabsorption, unspecified: Secondary | ICD-10-CM | POA: Diagnosis not present

## 2023-11-03 DIAGNOSIS — K922 Gastrointestinal hemorrhage, unspecified: Secondary | ICD-10-CM | POA: Insufficient documentation

## 2023-11-03 DIAGNOSIS — D500 Iron deficiency anemia secondary to blood loss (chronic): Secondary | ICD-10-CM

## 2023-11-03 LAB — CBC WITH DIFFERENTIAL (CANCER CENTER ONLY)
Abs Immature Granulocytes: 0.02 10*3/uL (ref 0.00–0.07)
Basophils Absolute: 0 10*3/uL (ref 0.0–0.1)
Basophils Relative: 1 %
Eosinophils Absolute: 0.1 10*3/uL (ref 0.0–0.5)
Eosinophils Relative: 2 %
HCT: 42.9 % (ref 39.0–52.0)
Hemoglobin: 14.7 g/dL (ref 13.0–17.0)
Immature Granulocytes: 0 %
Lymphocytes Relative: 18 %
Lymphs Abs: 1.3 10*3/uL (ref 0.7–4.0)
MCH: 32.2 pg (ref 26.0–34.0)
MCHC: 34.3 g/dL (ref 30.0–36.0)
MCV: 93.9 fL (ref 80.0–100.0)
Monocytes Absolute: 0.6 10*3/uL (ref 0.1–1.0)
Monocytes Relative: 8 %
Neutro Abs: 5.2 10*3/uL (ref 1.7–7.7)
Neutrophils Relative %: 71 %
Platelet Count: 209 10*3/uL (ref 150–400)
RBC: 4.57 MIL/uL (ref 4.22–5.81)
RDW: 13 % (ref 11.5–15.5)
WBC Count: 7.3 10*3/uL (ref 4.0–10.5)
nRBC: 0 % (ref 0.0–0.2)

## 2023-11-03 LAB — IRON AND IRON BINDING CAPACITY (CC-WL,HP ONLY)
Iron: 90 ug/dL (ref 45–182)
Saturation Ratios: 30 % (ref 17.9–39.5)
TIBC: 304 ug/dL (ref 250–450)
UIBC: 214 ug/dL (ref 117–376)

## 2023-11-03 LAB — RETICULOCYTES
Immature Retic Fract: 7.1 % (ref 2.3–15.9)
RBC.: 4.54 MIL/uL (ref 4.22–5.81)
Retic Count, Absolute: 62.7 10*3/uL (ref 19.0–186.0)
Retic Ct Pct: 1.4 % (ref 0.4–3.1)

## 2023-11-03 LAB — FERRITIN: Ferritin: 416 ng/mL — ABNORMAL HIGH (ref 24–336)

## 2023-11-03 NOTE — Progress Notes (Unsigned)
Hematology and Oncology Follow Up Visit  Adam Quinn [id number] [birth year] 67 y.o. 11/05/2023   Principle Diagnosis:  Iron deficiency anemia Erythropoietin deficiency anemia   Current Therapy:        IV iron as indicated- Feraheme- 08/27/2023               Interim History:  Adam Quinn is here today for follow-up.   Today he states that he is doing well. He has no concerns at this time.  No recent GI bleeding.  No other blood loss noted. No abnormal bruising, no petechiae.  No fever, chills, n/v, cough, rash, dizziness, chest pain, palpitations, abdominal pain or changes in bowel or bladder habit.  No swelling in his extremities.  Numbness and tingling in the hands and feet unchanged from baseline.  No falls or syncope  reported. He is considering getting a walking stick to help with balance.  Appetite and hydration are good.   ECOG Performance Status: 1 - Symptomatic but completely ambulatory  Medications:  Allergies as of 11/03/2023       Reactions   Aspirin Anaphylaxis, Hives   Shrimp [shellfish Allergy] Hives   Just shrimp        Medication List        Accurate as of November 03, 2023 11:59 PM. If you have any questions, ask your nurse or doctor.          Alavert D-12 Hour Allergy/Cong 5-120 MG tablet Generic drug: loratadine-pseudoephedrine Take 1 tablet by mouth at bedtime.   clopidogrel 75 MG tablet Commonly known as: PLAVIX Take 1 tablet (75 mg total) by mouth daily.   dorzolamide 2 % ophthalmic solution Commonly known as: TRUSOPT Place 1 drop into both eyes 2 (two) times daily. What changed: Another medication with the same name was removed. Continue taking this medication, and follow the directions you see here. Changed by: Adam Quinn   ELDERBERRY PO Take 1 tablet by mouth at bedtime. Gummy   FIBER PO Take 1 tablet by mouth daily. Miralax Adam fiber gummy.   FreeStyle Libre 2 Sensor Misc Apply 1 sensor as directed every 14 days  to read blood sugar continously.   gabapentin 300 MG capsule Commonly known as: NEURONTIN Take 1 capsule (300 mg total) by mouth daily. What changed: Another medication with the same name was removed. Continue taking this medication, and follow the directions you see here. Changed by: Adam Quinn   HYDROcodone-acetaminophen 5-325 MG tablet Commonly known as: NORCO/VICODIN Take 1 tablet by mouth every 8 (eight) hours as needed for moderate pain.   ibuprofen 800 MG tablet Commonly known as: IBU TAKE 1 TABLET BY MOUTH EVERY 6 TO 8 HOURS AS NEEDED.   Insupen Pen Needles 31G X 5 MM Misc Generic drug: Insulin Pen Needle Use 1 needle to inject Tresiba under the skin once daily as directed.   lansoprazole 30 MG capsule Commonly known as: PREVACID Take 1 capsule (30 mg total) by mouth 2 (two) times daily.   Lumigan 0.01 % Soln Generic drug: bimatoprost Place 1 drop into both eyes at bedtime.   Meclizine HCl 25 MG Chew 1 tablet as needed Orally every 12 hrs   Melatonin 5 MG Chew Chew 5 mg by mouth at bedtime.   metFORMIN 500 MG 24 hr tablet Commonly known as: GLUCOPHAGE-XR Take 2 tablets (1,000 mg total) by mouth 2 (two) times daily.   RA PROBIOTIC GUMMIES PO Take 1 capsule by mouth at bedtime.  sertraline 100 MG tablet Commonly known as: ZOLOFT Take 1 tablet (100 mg total) by mouth daily.   simvastatin 20 MG tablet Commonly known as: ZOCOR Take 1 tablet (20 mg total) by mouth daily.   sodium bicarbonate 650 MG tablet Take 1 tablet by mouth 2 times daily   tamsulosin 0.4 MG Caps capsule Commonly known as: FLOMAX Take 1 capsule (0.4 mg total) by mouth at bedtime. What changed: Another medication with the same name was removed. Continue taking this medication, and follow the directions you see here. Changed by: Adam Quinn 200 UNIT/ML Quinn Pen Generic drug: insulin degludec Inject 30 Units into the skin daily.   Trulicity 1.5  MG/0.5ML Soaj Generic drug: Dulaglutide Inject under the skin once a week   Trulicity 1.5 MG/0.5ML Soaj Generic drug: Dulaglutide Inject 1.5 mg into the skin once a week.        Allergies:  Allergies  Allergen Reactions   Aspirin Anaphylaxis and Hives   Shrimp [Shellfish Allergy] Hives    Just shrimp     Past Medical History, Surgical history, Social history, and Family History were reviewed and updated.  Review of Systems: All other 10 point review of systems is negative.   Physical Exam:  height is 5' 8.11" (1.73 m) and weight is 186 lb (84.4 kg). His oral temperature is 98.6 F (37 C). His blood pressure is 119/69 and his pulse is 80. His respiration is 18 and oxygen saturation is 99%.   Wt Readings from Last 3 Encounters:  11/03/23 186 lb (84.4 kg)  08/04/23 186 lb 12.8 oz (84.7 kg)  05/06/23 180 lb 0.6 oz (81.7 kg)    Ocular: Sclerae unicteric, pupils equal, round and reactive to light Ear-nose-throat: Oropharynx clear, dentition fair Lymphatic: No cervical or supraclavicular adenopathy Lungs no rales or rhonchi, good excursion bilaterally Heart regular rate and rhythm, no murmur appreciated Abd soft, nontender, positive bowel sounds MSK no focal spinal tenderness, no joint edema Neuro: non-focal, well-oriented, appropriate affect   Lab Results  Component Value Date   WBC 7.3 11/03/2023   HGB 14.7 11/03/2023   HCT 42.9 11/03/2023   MCV 93.9 11/03/2023   PLT 209 11/03/2023   Lab Results  Component Value Date   FERRITIN 416 (H) 11/03/2023   IRON 90 11/03/2023   TIBC 304 11/03/2023   UIBC 214 11/03/2023   IRONPCTSAT 30 11/03/2023   Lab Results  Component Value Date   RETICCTPCT 1.4 11/03/2023   RBC 4.57 11/03/2023   RBC 4.54 11/03/2023   No results found for: "KPAFRELGTCHN", "LAMBDASER", "KAPLAMBRATIO" No results found for: "IGGSERUM", "IGA", "IGMSERUM" No results found for: "TOTALPROTELP", "ALBUMINELP", "A1GS", "A2GS", "BETS", "BETA2SER",  "GAMS", "MSPIKE", "SPEI"   Chemistry      Component Value Date/Time   NA 140 07/17/2023 1136   NA 140 05/26/2017 0856   K 5.0 07/17/2023 1136   K 4.2 05/26/2017 0856   CL 104 07/17/2023 1136   CO2 26 07/17/2023 1136   CO2 25 05/26/2017 0856   BUN 18 07/17/2023 1136   BUN 12.7 05/26/2017 0856   CREATININE 1.43 07/17/2023 1136   CREATININE 1.32 (H) 06/13/2022 1027   CREATININE 1.2 05/26/2017 0856      Component Value Date/Time   CALCIUM 9.6 07/17/2023 1136   CALCIUM 9.5 05/26/2017 0856   ALKPHOS 84 12/02/2022 0938   ALKPHOS 83 05/26/2017 0856   AST 11 12/02/2022 0938   AST 13 (L) 06/13/2022 1027   AST 16  05/26/2017 0856   ALT 15 12/02/2022 0938   ALT 15 06/13/2022 1027   ALT 22 05/26/2017 0856   BILITOT 0.3 12/02/2022 0938   BILITOT 0.2 (L) 06/13/2022 1027   BILITOT 0.28 05/26/2017 0856     Encounter Diagnosis  Name Primary?   Iron deficiency anemia due to chronic blood loss Yes    Impression and Plan: Mr. 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   Today Hgb is normal at 14.7 which is up from 12.9 previously on 08/04/2023 Iron studies are pending. Will replace if needed  RTC 3 months APP, labs (CBC, iron, ferritin, retic)  Adam Chestnut, PA-C 12/12/20247:35 AM

## 2023-11-05 ENCOUNTER — Encounter: Payer: Self-pay | Admitting: Family

## 2023-11-09 ENCOUNTER — Other Ambulatory Visit (HOSPITAL_BASED_OUTPATIENT_CLINIC_OR_DEPARTMENT_OTHER): Payer: Self-pay

## 2023-11-09 MED ORDER — METFORMIN HCL ER 500 MG PO TB24
1000.0000 mg | ORAL_TABLET | Freq: Two times a day (BID) | ORAL | 0 refills | Status: DC
  Filled 2023-11-09: qty 120, 30d supply, fill #0

## 2023-11-10 ENCOUNTER — Other Ambulatory Visit: Payer: Self-pay

## 2023-11-11 ENCOUNTER — Other Ambulatory Visit: Payer: Self-pay

## 2023-11-11 ENCOUNTER — Other Ambulatory Visit (HOSPITAL_BASED_OUTPATIENT_CLINIC_OR_DEPARTMENT_OTHER): Payer: Self-pay

## 2023-11-16 ENCOUNTER — Other Ambulatory Visit: Payer: Self-pay

## 2023-11-16 ENCOUNTER — Other Ambulatory Visit (HOSPITAL_BASED_OUTPATIENT_CLINIC_OR_DEPARTMENT_OTHER): Payer: Self-pay

## 2023-11-17 ENCOUNTER — Other Ambulatory Visit: Payer: Self-pay

## 2023-11-24 ENCOUNTER — Other Ambulatory Visit (HOSPITAL_BASED_OUTPATIENT_CLINIC_OR_DEPARTMENT_OTHER): Payer: Self-pay

## 2023-11-24 DIAGNOSIS — Z125 Encounter for screening for malignant neoplasm of prostate: Secondary | ICD-10-CM | POA: Diagnosis not present

## 2023-11-24 DIAGNOSIS — F39 Unspecified mood [affective] disorder: Secondary | ICD-10-CM | POA: Diagnosis not present

## 2023-11-24 DIAGNOSIS — E785 Hyperlipidemia, unspecified: Secondary | ICD-10-CM | POA: Diagnosis not present

## 2023-11-24 DIAGNOSIS — I5189 Other ill-defined heart diseases: Secondary | ICD-10-CM | POA: Diagnosis not present

## 2023-11-24 DIAGNOSIS — I639 Cerebral infarction, unspecified: Secondary | ICD-10-CM | POA: Diagnosis not present

## 2023-11-24 DIAGNOSIS — E1142 Type 2 diabetes mellitus with diabetic polyneuropathy: Secondary | ICD-10-CM | POA: Diagnosis not present

## 2023-11-24 DIAGNOSIS — Z23 Encounter for immunization: Secondary | ICD-10-CM | POA: Diagnosis not present

## 2023-11-24 DIAGNOSIS — N1831 Chronic kidney disease, stage 3a: Secondary | ICD-10-CM | POA: Diagnosis not present

## 2023-11-24 DIAGNOSIS — E11319 Type 2 diabetes mellitus with unspecified diabetic retinopathy without macular edema: Secondary | ICD-10-CM | POA: Diagnosis not present

## 2023-11-24 DIAGNOSIS — Z0000 Encounter for general adult medical examination without abnormal findings: Secondary | ICD-10-CM | POA: Diagnosis not present

## 2023-11-24 DIAGNOSIS — Z794 Long term (current) use of insulin: Secondary | ICD-10-CM | POA: Diagnosis not present

## 2023-11-24 DIAGNOSIS — E1121 Type 2 diabetes mellitus with diabetic nephropathy: Secondary | ICD-10-CM | POA: Diagnosis not present

## 2023-11-24 MED ORDER — METFORMIN HCL ER 500 MG PO TB24
1000.0000 mg | ORAL_TABLET | Freq: Two times a day (BID) | ORAL | 1 refills | Status: DC
  Filled 2023-11-24 – 2023-12-02 (×2): qty 360, 90d supply, fill #0
  Filled 2024-03-07: qty 360, 90d supply, fill #1

## 2023-11-26 ENCOUNTER — Other Ambulatory Visit (HOSPITAL_BASED_OUTPATIENT_CLINIC_OR_DEPARTMENT_OTHER): Payer: Self-pay

## 2023-11-26 MED ORDER — LOSARTAN POTASSIUM 25 MG PO TABS
25.0000 mg | ORAL_TABLET | Freq: Every day | ORAL | 1 refills | Status: DC
  Filled 2023-11-26: qty 100, 100d supply, fill #0
  Filled 2024-03-02: qty 100, 100d supply, fill #1

## 2023-12-02 ENCOUNTER — Other Ambulatory Visit (HOSPITAL_BASED_OUTPATIENT_CLINIC_OR_DEPARTMENT_OTHER): Payer: Self-pay

## 2023-12-09 ENCOUNTER — Other Ambulatory Visit (HOSPITAL_BASED_OUTPATIENT_CLINIC_OR_DEPARTMENT_OTHER): Payer: Self-pay

## 2023-12-09 ENCOUNTER — Encounter: Payer: Self-pay | Admitting: Podiatry

## 2023-12-16 ENCOUNTER — Telehealth: Payer: Self-pay | Admitting: *Deleted

## 2023-12-16 DIAGNOSIS — R935 Abnormal findings on diagnostic imaging of other abdominal regions, including retroperitoneum: Secondary | ICD-10-CM

## 2023-12-16 DIAGNOSIS — K869 Disease of pancreas, unspecified: Secondary | ICD-10-CM

## 2023-12-16 DIAGNOSIS — K8689 Other specified diseases of pancreas: Secondary | ICD-10-CM

## 2023-12-16 NOTE — Telephone Encounter (Signed)
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 Placed on recall list  This is from recall list   Gunnar Fusi you want a CT abdomen on this patient still ? It was in the recalls?  Thanks

## 2023-12-17 ENCOUNTER — Ambulatory Visit: Payer: PPO | Admitting: Podiatry

## 2023-12-17 ENCOUNTER — Encounter: Payer: Self-pay | Admitting: Podiatry

## 2023-12-17 DIAGNOSIS — M79675 Pain in left toe(s): Secondary | ICD-10-CM | POA: Diagnosis not present

## 2023-12-17 DIAGNOSIS — B351 Tinea unguium: Secondary | ICD-10-CM | POA: Diagnosis not present

## 2023-12-17 DIAGNOSIS — M79674 Pain in right toe(s): Secondary | ICD-10-CM | POA: Diagnosis not present

## 2023-12-17 DIAGNOSIS — E1142 Type 2 diabetes mellitus with diabetic polyneuropathy: Secondary | ICD-10-CM

## 2023-12-17 NOTE — Progress Notes (Signed)
This patient returns to my office for at risk foot care.  This patient requires this care by a professional since this patient will be at risk due to having diabetic neuropathy anc coagulation defect.  This patient is unable to cut nails himself since the patient cannot reach his nails.These nails are painful walking and wearing shoes.  This patient presents for at risk foot care today.  General Appearance  Alert, conversant and in no acute stress.  Vascular  Dorsalis pedis and posterior tibial  pulses are palpable  bilaterally.  Capillary return is within normal limits  bilaterally. Temperature is within normal limits  bilaterally.  Neurologic  Senn-Weinstein monofilament wire test absent  bilaterally. Muscle power within normal limits bilaterally.  Nails Thick disfigured discolored nails with subungual debris  Hallux nails  B/L. No evidence of bacterial infection or drainage bilaterally.  Orthopedic  No limitations of motion  feet .  No crepitus or effusions noted.  Hammer toes 2-4  B/L. Hallux malleus  B/L.  Cavus foot  B/L.  Skin  normotropic skin with no porokeratosis noted bilaterally.  No signs of infections or ulcers noted.     Onychomycosis  Pain in right toes  Pain in left toes  Consent was obtained for treatment procedures.   Mechanical debridement of hallux nails  bilaterally performed with a nail nipper.  Filed with dremel without incident.    Return office visit   4   months                  Told patient to return for periodic foot care and evaluation due to potential at risk complications.   Helane Gunther DPM

## 2023-12-17 NOTE — Telephone Encounter (Signed)
==  View-only below this line=== ----- Message ----- From: Meredith Pel, NP Sent: 12/17/2023   2:14 PM EST To: Marlowe Kays, CMA; Napoleon Form, MD  Adam Quinn Dr. [name] may prefer this time to get an MRI in lieu of a CT scan.  I am going to include her on this message.  Thanks ----- Message ----- From: Marlowe Kays, CMA Sent: 12/16/2023  10:37 AM EST To: Meredith Pel, NP

## 2023-12-18 NOTE — Telephone Encounter (Signed)
Either dedicated CT abdomen pancreas protocol with and without contrast or MR abdomen pancreas protocol is fine based on his insurance coverage. Thanks

## 2023-12-21 NOTE — Telephone Encounter (Signed)
Placed order for CT Abdomen Pancreas W/WO

## 2023-12-29 ENCOUNTER — Encounter (HOSPITAL_COMMUNITY): Payer: Self-pay

## 2023-12-29 ENCOUNTER — Ambulatory Visit (HOSPITAL_COMMUNITY)
Admission: RE | Admit: 2023-12-29 | Discharge: 2023-12-29 | Disposition: A | Discharge status: Home / Self Care | Payer: PPO | Source: Ambulatory Visit | Attending: Gastroenterology | Admitting: Gastroenterology

## 2023-12-29 DIAGNOSIS — K869 Disease of pancreas, unspecified: Secondary | ICD-10-CM | POA: Diagnosis present

## 2023-12-29 DIAGNOSIS — R1012 Left upper quadrant pain: Secondary | ICD-10-CM | POA: Diagnosis present

## 2023-12-29 DIAGNOSIS — K8689 Other specified diseases of pancreas: Secondary | ICD-10-CM | POA: Diagnosis present

## 2023-12-29 DIAGNOSIS — R935 Abnormal findings on diagnostic imaging of other abdominal regions, including retroperitoneum: Secondary | ICD-10-CM | POA: Diagnosis present

## 2023-12-29 LAB — POCT I-STAT CREATININE: Creatinine, Ser: 1.2 mg/dL (ref 0.61–1.24)

## 2023-12-29 MED ORDER — SODIUM CHLORIDE (PF) 0.9 % IJ SOLN
INTRAMUSCULAR | Status: AC
  Filled 2023-12-29: qty 50

## 2023-12-29 MED ORDER — IOHEXOL 300 MG/ML  SOLN
100.0000 mL | Freq: Once | INTRAMUSCULAR | Status: AC | PRN
  Administered 2023-12-29: 100 mL via INTRAVENOUS

## 2024-01-18 ENCOUNTER — Other Ambulatory Visit: Payer: Self-pay

## 2024-01-18 ENCOUNTER — Other Ambulatory Visit (HOSPITAL_BASED_OUTPATIENT_CLINIC_OR_DEPARTMENT_OTHER): Payer: Self-pay

## 2024-01-18 ENCOUNTER — Encounter: Payer: Self-pay | Admitting: Family

## 2024-01-18 MED ORDER — SODIUM BICARBONATE 650 MG PO TABS
650.0000 mg | ORAL_TABLET | Freq: Two times a day (BID) | ORAL | 0 refills | Status: DC
  Filled 2024-01-18: qty 180, 90d supply, fill #0

## 2024-01-22 ENCOUNTER — Telehealth: Payer: Self-pay | Admitting: Nurse Practitioner

## 2024-01-22 NOTE — Telephone Encounter (Signed)
 Reviewed note. MRCP 6 month reminder sent to Dr. Elana Alm nurse Waynetta Sandy.

## 2024-01-22 NOTE — Telephone Encounter (Signed)
 Inbound call from patient's wife, calling to follow up on CT results. States the results came in last week  but has not heard anything from our office.

## 2024-01-22 NOTE — Telephone Encounter (Signed)
 Called and discussed results with patient, please see result note.  Thank you

## 2024-02-03 ENCOUNTER — Inpatient Hospital Stay: Payer: PPO

## 2024-02-03 ENCOUNTER — Ambulatory Visit: Payer: PPO | Admitting: Family

## 2024-02-05 ENCOUNTER — Encounter: Payer: Self-pay | Admitting: Family

## 2024-02-05 ENCOUNTER — Telehealth: Payer: Self-pay | Admitting: Family

## 2024-02-05 NOTE — Telephone Encounter (Signed)
 Called to reschedule appointments per pt req via mychart. LVM to return call for scheduling.

## 2024-02-12 ENCOUNTER — Other Ambulatory Visit (HOSPITAL_BASED_OUTPATIENT_CLINIC_OR_DEPARTMENT_OTHER): Payer: Self-pay

## 2024-02-12 DIAGNOSIS — E119 Type 2 diabetes mellitus without complications: Secondary | ICD-10-CM | POA: Diagnosis not present

## 2024-02-12 DIAGNOSIS — Z961 Presence of intraocular lens: Secondary | ICD-10-CM | POA: Diagnosis not present

## 2024-02-12 DIAGNOSIS — H401132 Primary open-angle glaucoma, bilateral, moderate stage: Secondary | ICD-10-CM | POA: Diagnosis not present

## 2024-02-12 DIAGNOSIS — B0052 Herpesviral keratitis: Secondary | ICD-10-CM | POA: Diagnosis not present

## 2024-02-12 DIAGNOSIS — H18593 Other hereditary corneal dystrophies, bilateral: Secondary | ICD-10-CM | POA: Diagnosis not present

## 2024-02-15 ENCOUNTER — Inpatient Hospital Stay: Admitting: Family

## 2024-02-15 ENCOUNTER — Inpatient Hospital Stay

## 2024-02-16 ENCOUNTER — Encounter: Payer: Self-pay | Admitting: Family

## 2024-02-16 ENCOUNTER — Inpatient Hospital Stay (HOSPITAL_BASED_OUTPATIENT_CLINIC_OR_DEPARTMENT_OTHER): Admitting: Family

## 2024-02-16 ENCOUNTER — Inpatient Hospital Stay: Attending: Hematology & Oncology

## 2024-02-16 VITALS — BP 136/70 | HR 94 | Temp 98.0°F | Resp 17 | Wt 186.1 lb

## 2024-02-16 DIAGNOSIS — K909 Intestinal malabsorption, unspecified: Secondary | ICD-10-CM | POA: Insufficient documentation

## 2024-02-16 DIAGNOSIS — D500 Iron deficiency anemia secondary to blood loss (chronic): Secondary | ICD-10-CM | POA: Diagnosis not present

## 2024-02-16 DIAGNOSIS — K922 Gastrointestinal hemorrhage, unspecified: Secondary | ICD-10-CM | POA: Diagnosis not present

## 2024-02-16 LAB — IRON AND IRON BINDING CAPACITY (CC-WL,HP ONLY)
Iron: 75 ug/dL (ref 45–182)
Saturation Ratios: 23 % (ref 17.9–39.5)
TIBC: 321 ug/dL (ref 250–450)
UIBC: 246 ug/dL (ref 117–376)

## 2024-02-16 LAB — CBC
HCT: 40.1 % (ref 39.0–52.0)
Hemoglobin: 13.5 g/dL (ref 13.0–17.0)
MCH: 32.1 pg (ref 26.0–34.0)
MCHC: 33.7 g/dL (ref 30.0–36.0)
MCV: 95.5 fL (ref 80.0–100.0)
Platelets: 234 10*3/uL (ref 150–400)
RBC: 4.2 MIL/uL — ABNORMAL LOW (ref 4.22–5.81)
RDW: 12.9 % (ref 11.5–15.5)
WBC: 7.1 10*3/uL (ref 4.0–10.5)
nRBC: 0 % (ref 0.0–0.2)

## 2024-02-16 LAB — RETIC PANEL
Immature Retic Fract: 13 % (ref 2.3–15.9)
RBC.: 4.16 MIL/uL — ABNORMAL LOW (ref 4.22–5.81)
Retic Count, Absolute: 70.7 10*3/uL (ref 19.0–186.0)
Retic Ct Pct: 1.7 % (ref 0.4–3.1)
Reticulocyte Hemoglobin: 35 pg (ref >27.9)

## 2024-02-16 LAB — FERRITIN: Ferritin: 378 ng/mL — ABNORMAL HIGH (ref 24–336)

## 2024-02-16 NOTE — Progress Notes (Signed)
 Hematology and Oncology Follow Up Visit  Adam Quinn [id number] Feb 21, 1956 68 y.o. 02/16/2024   Principle Diagnosis:  Iron deficiency anemia Erythropoietin deficiency anemia   Current Therapy:        IV iron as indicated- Feraheme- 08/27/2023   Interim History:  Adam Quinn is here today for follow-up. He is doing well and has no complaints at this time.  He has not noted any blood loss. No bruising or petechiae.  No issue with fever, chills, n/v, cough, rash, SOB, chest pain, palpitations, abdominal pain or changes in bowel or bladder habits.  No recent flares with vertigo.  No swelling, tenderness, numbness or tingling in his extremities at this time.  No falls or syncope.  Appetite and hydration are good. Weight is stable at 186 lbs.   ECOG Performance Status: 1 - Symptomatic but completely ambulatory  Medications:  Allergies as of 02/16/2024       Reactions   Aspirin Anaphylaxis, Hives   Shrimp [shellfish Allergy] Hives   Just shrimp        Medication List        Accurate as of February 16, 2024  2:10 PM. If you have any questions, ask your nurse or doctor.          Alavert D-12 Hour Allergy/Cong 5-120 MG tablet Generic drug: loratadine-pseudoephedrine Take 1 tablet by mouth at bedtime.   clopidogrel 75 MG tablet Commonly known as: PLAVIX Take 1 tablet (75 mg total) by mouth daily.   dorzolamide 2 % ophthalmic solution Commonly known as: TRUSOPT Place 1 drop into both eyes 2 (two) times daily.   ELDERBERRY PO Take 1 tablet by mouth at bedtime. Gummy   FIBER PO Take 1 tablet by mouth daily. Miralax brand fiber gummy.   FreeStyle Libre 2 Sensor Misc Apply 1 sensor as directed every 14 days to read blood sugar continously.   gabapentin 300 MG capsule Commonly known as: NEURONTIN Take 1 capsule (300 mg total) by mouth daily.   HYDROcodone-acetaminophen 5-325 MG tablet Commonly known as: NORCO/VICODIN Take 1 tablet by mouth every 8 (eight) hours as  needed for moderate pain.   ibuprofen 800 MG tablet Commonly known as: IBU TAKE 1 TABLET BY MOUTH EVERY 6 TO 8 HOURS AS NEEDED.   Insupen Pen Needles 31G X 5 MM Misc Generic drug: Insulin Pen Needle Use 1 needle to inject Tresiba under the skin once daily as directed.   lansoprazole 30 MG capsule Commonly known as: PREVACID Take 1 capsule (30 mg total) by mouth 2 (two) times daily.   losartan 25 MG tablet Commonly known as: COZAAR Take 1 tablet (25 mg total) by mouth daily for kidney protection.   Lumigan 0.01 % Soln Generic drug: bimatoprost Place 1 drop into both eyes at bedtime.   Meclizine HCl 25 MG Chew 1 tablet as needed Orally every 12 hrs   Melatonin 5 MG Chew Chew 5 mg by mouth at bedtime.   metFORMIN 500 MG 24 hr tablet Commonly known as: GLUCOPHAGE-XR Take 2 tablets (1,000 mg total) by mouth 2 (two) times daily.   RA PROBIOTIC GUMMIES PO Take 1 capsule by mouth at bedtime.   sertraline 100 MG tablet Commonly known as: ZOLOFT Take 1 tablet (100 mg total) by mouth daily.   simvastatin 20 MG tablet Commonly known as: ZOCOR Take 1 tablet (20 mg total) by mouth daily.   sodium bicarbonate 650 MG tablet Take 1 tablet (650 mg total) by mouth 2 (two) times daily.  tamsulosin 0.4 MG Caps capsule Commonly known as: FLOMAX Take 1 capsule (0.4 mg total) by mouth at bedtime.   Evaristo Bury FlexTouch 200 UNIT/ML FlexTouch Pen Generic drug: insulin degludec Inject 30 Units into the skin daily.   Trulicity 1.5 MG/0.5ML Soaj Generic drug: Dulaglutide Inject under the skin once a week   Trulicity 1.5 MG/0.5ML Soaj Generic drug: Dulaglutide Inject 1.5 mg into the skin once a week.        Allergies:  Allergies  Allergen Reactions   Aspirin Anaphylaxis and Hives   Shrimp [Shellfish Allergy] Hives    Just shrimp     Past Medical History, Surgical history, Social history, and Family History were reviewed and updated.  Review of Systems: All other 10 point  review of systems is negative.   Physical Exam:  weight is 186 lb 1.9 oz (84.4 kg). His oral temperature is 98 F (36.7 C). His blood pressure is 136/70 and his pulse is 94. His respiration is 17 and oxygen saturation is 99%.   Wt Readings from Last 3 Encounters:  02/16/24 186 lb 1.9 oz (84.4 kg)  11/03/23 186 lb (84.4 kg)  08/04/23 186 lb 12.8 oz (84.7 kg)    Ocular: Sclerae unicteric, pupils equal, round and reactive to light Ear-nose-throat: Oropharynx clear, dentition fair Lymphatic: No cervical or supraclavicular adenopathy Lungs no rales or rhonchi, good excursion bilaterally Heart regular rate and rhythm, no murmur appreciated Abd soft, nontender, positive bowel sounds MSK no focal spinal tenderness, no joint edema Neuro: non-focal, well-oriented, appropriate affect Breasts: Deferred   Lab Results  Component Value Date   WBC 7.1 02/16/2024   HGB 13.5 02/16/2024   HCT 40.1 02/16/2024   MCV 95.5 02/16/2024   PLT 234 02/16/2024   Lab Results  Component Value Date   FERRITIN 416 (H) 11/03/2023   IRON 90 11/03/2023   TIBC 304 11/03/2023   UIBC 214 11/03/2023   IRONPCTSAT 30 11/03/2023   Lab Results  Component Value Date   RETICCTPCT 1.7 02/16/2024   RBC 4.16 (L) 02/16/2024   No results found for: "KPAFRELGTCHN", "LAMBDASER", "KAPLAMBRATIO" No results found for: "IGGSERUM", "IGA", "IGMSERUM" No results found for: "TOTALPROTELP", "ALBUMINELP", "A1GS", "A2GS", "BETS", "BETA2SER", "GAMS", "MSPIKE", "SPEI"   Chemistry      Component Value Date/Time   NA 140 07/17/2023 1136   NA 140 05/26/2017 0856   K 5.0 07/17/2023 1136   K 4.2 05/26/2017 0856   CL 104 07/17/2023 1136   CO2 26 07/17/2023 1136   CO2 25 05/26/2017 0856   BUN 18 07/17/2023 1136   BUN 12.7 05/26/2017 0856   CREATININE 1.20 12/29/2023 1243   CREATININE 1.32 (H) 06/13/2022 1027   CREATININE 1.2 05/26/2017 0856      Component Value Date/Time   CALCIUM 9.6 07/17/2023 1136   CALCIUM 9.5 05/26/2017  0856   ALKPHOS 84 12/02/2022 0938   ALKPHOS 83 05/26/2017 0856   AST 11 12/02/2022 0938   AST 13 (L) 06/13/2022 1027   AST 16 05/26/2017 0856   ALT 15 12/02/2022 0938   ALT 15 06/13/2022 1027   ALT 22 05/26/2017 0856   BILITOT 0.3 12/02/2022 0938   BILITOT 0.2 (L) 06/13/2022 1027   BILITOT 0.28 05/26/2017 0856       Impression and Plan: Mr. 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   Iron studies are pending. We will replace if needed.  Follow-up in 3  months.   Eileen Stanford, NP 3/25/20252:10 PM

## 2024-02-17 ENCOUNTER — Other Ambulatory Visit (HOSPITAL_BASED_OUTPATIENT_CLINIC_OR_DEPARTMENT_OTHER): Payer: Self-pay

## 2024-02-17 ENCOUNTER — Other Ambulatory Visit: Payer: Self-pay

## 2024-02-17 MED ORDER — LUMIGAN 0.01 % OP SOLN
1.0000 [drp] | Freq: Every day | OPHTHALMIC | 3 refills | Status: DC
  Filled 2024-02-17: qty 2.5, 50d supply, fill #0
  Filled 2024-05-17: qty 2.5, 50d supply, fill #1
  Filled 2024-07-03: qty 2.5, 50d supply, fill #2
  Filled 2024-08-22: qty 2.5, 30d supply, fill #3

## 2024-02-17 MED ORDER — BIMATOPROST 0.03 % OP SOLN
1.0000 [drp] | Freq: Every day | OPHTHALMIC | 3 refills | Status: DC
  Filled 2024-02-17: qty 2.5, 50d supply, fill #0
  Filled 2024-05-17: qty 7.5, 150d supply, fill #0
  Filled 2024-05-23: qty 7.5, 75d supply, fill #0
  Filled 2024-08-22: qty 7.5, 100d supply, fill #0

## 2024-02-17 MED ORDER — DORZOLAMIDE HCL 2 % OP SOLN
1.0000 [drp] | Freq: Two times a day (BID) | OPHTHALMIC | 3 refills | Status: AC
  Filled 2024-02-17: qty 10, 50d supply, fill #0
  Filled 2024-04-12: qty 10, 50d supply, fill #1
  Filled 2024-05-26: qty 10, 50d supply, fill #2
  Filled 2024-07-15: qty 10, 50d supply, fill #3
  Filled 2024-09-01: qty 10, 50d supply, fill #4
  Filled 2024-10-21: qty 10, 28d supply, fill #5
  Filled 2024-11-17: qty 10, 28d supply, fill #6
  Filled 2024-12-15: qty 10, 28d supply, fill #7

## 2024-03-02 ENCOUNTER — Other Ambulatory Visit: Payer: Self-pay

## 2024-03-02 ENCOUNTER — Other Ambulatory Visit (HOSPITAL_BASED_OUTPATIENT_CLINIC_OR_DEPARTMENT_OTHER): Payer: Self-pay

## 2024-03-02 MED ORDER — LANSOPRAZOLE 30 MG PO CPDR
30.0000 mg | DELAYED_RELEASE_CAPSULE | Freq: Two times a day (BID) | ORAL | 0 refills | Status: DC
Start: 2024-03-02 — End: 2024-05-26
  Filled 2024-03-02: qty 180, 90d supply, fill #0

## 2024-03-10 ENCOUNTER — Other Ambulatory Visit (HOSPITAL_BASED_OUTPATIENT_CLINIC_OR_DEPARTMENT_OTHER): Payer: Self-pay

## 2024-03-11 ENCOUNTER — Other Ambulatory Visit: Payer: Self-pay

## 2024-03-11 ENCOUNTER — Other Ambulatory Visit (HOSPITAL_BASED_OUTPATIENT_CLINIC_OR_DEPARTMENT_OTHER): Payer: Self-pay

## 2024-03-14 ENCOUNTER — Other Ambulatory Visit (HOSPITAL_BASED_OUTPATIENT_CLINIC_OR_DEPARTMENT_OTHER): Payer: Self-pay

## 2024-03-15 ENCOUNTER — Other Ambulatory Visit (HOSPITAL_BASED_OUTPATIENT_CLINIC_OR_DEPARTMENT_OTHER): Payer: Self-pay

## 2024-03-16 ENCOUNTER — Other Ambulatory Visit (HOSPITAL_BASED_OUTPATIENT_CLINIC_OR_DEPARTMENT_OTHER): Payer: Self-pay

## 2024-03-17 ENCOUNTER — Encounter: Payer: Self-pay | Admitting: Podiatry

## 2024-03-17 ENCOUNTER — Other Ambulatory Visit (HOSPITAL_BASED_OUTPATIENT_CLINIC_OR_DEPARTMENT_OTHER): Payer: Self-pay

## 2024-03-17 ENCOUNTER — Ambulatory Visit (INDEPENDENT_AMBULATORY_CARE_PROVIDER_SITE_OTHER): Payer: PPO | Admitting: Podiatry

## 2024-03-17 DIAGNOSIS — B351 Tinea unguium: Secondary | ICD-10-CM

## 2024-03-17 DIAGNOSIS — M79675 Pain in left toe(s): Secondary | ICD-10-CM | POA: Diagnosis not present

## 2024-03-17 DIAGNOSIS — E1142 Type 2 diabetes mellitus with diabetic polyneuropathy: Secondary | ICD-10-CM | POA: Diagnosis not present

## 2024-03-17 DIAGNOSIS — M79674 Pain in right toe(s): Secondary | ICD-10-CM | POA: Diagnosis not present

## 2024-03-17 NOTE — Progress Notes (Signed)
 This patient returns to my office for at risk foot care.  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  This patient is unable to cut nails himself since the patient cannot reach his nails.These nails are painful walking and wearing shoes.  This patient presents for at risk foot care today.  General Appearance  Alert, conversant and in no acute stress.  Vascular  Dorsalis pedis and posterior tibial  pulses are palpable  bilaterally.  Capillary return is within normal limits  bilaterally. Temperature is within normal limits  bilaterally.  Neurologic  Senn-Weinstein monofilament wire test absent  bilaterally. Muscle power within normal limits bilaterally.  Nails Thick disfigured discolored nails with subungual debris  Hallux nails  B/L. No evidence of bacterial infection or drainage bilaterally.  Orthopedic  No limitations of motion  feet .  No crepitus or effusions noted.  Hammer toes 2-4  B/L. Hallux malleus  B/L.  Cavus foot  B/L.  Skin  normotropic skin with no porokeratosis noted bilaterally.  No signs of infections or ulcers noted.     Onychomycosis  Pain in right toes  Pain in left toes  Consent was obtained for treatment procedures.   Mechanical debridement of hallux nails  bilaterally performed with a nail nipper.  Filed with dremel without incident.    Return office visit   4   months                  Told patient to return for periodic foot care and evaluation due to potential at risk complications.   Ruffin Cotton DPM

## 2024-03-24 ENCOUNTER — Other Ambulatory Visit (HOSPITAL_BASED_OUTPATIENT_CLINIC_OR_DEPARTMENT_OTHER): Payer: Self-pay

## 2024-03-26 ENCOUNTER — Other Ambulatory Visit (HOSPITAL_BASED_OUTPATIENT_CLINIC_OR_DEPARTMENT_OTHER): Payer: Self-pay

## 2024-03-28 ENCOUNTER — Other Ambulatory Visit (HOSPITAL_BASED_OUTPATIENT_CLINIC_OR_DEPARTMENT_OTHER): Payer: Self-pay

## 2024-03-28 ENCOUNTER — Other Ambulatory Visit: Payer: Self-pay

## 2024-03-28 MED ORDER — FREESTYLE LIBRE 2 SENSOR MISC
1.0000 | 1 refills | Status: DC
  Filled 2024-03-28: qty 6, 84d supply, fill #0
  Filled 2024-06-15: qty 6, 84d supply, fill #1

## 2024-04-12 ENCOUNTER — Encounter: Payer: Self-pay | Admitting: Family

## 2024-04-12 ENCOUNTER — Other Ambulatory Visit: Payer: Self-pay

## 2024-04-12 ENCOUNTER — Other Ambulatory Visit (HOSPITAL_BASED_OUTPATIENT_CLINIC_OR_DEPARTMENT_OTHER): Payer: Self-pay

## 2024-04-12 MED ORDER — SODIUM BICARBONATE 650 MG PO TABS
650.0000 mg | ORAL_TABLET | Freq: Two times a day (BID) | ORAL | 0 refills | Status: DC
  Filled 2024-04-12: qty 180, 90d supply, fill #0

## 2024-04-12 MED ORDER — SERTRALINE HCL 100 MG PO TABS
100.0000 mg | ORAL_TABLET | Freq: Every day | ORAL | 2 refills | Status: AC
  Filled 2024-04-12: qty 90, 90d supply, fill #0
  Filled 2024-07-15: qty 90, 90d supply, fill #1
  Filled 2024-10-11: qty 90, 90d supply, fill #2

## 2024-04-13 ENCOUNTER — Other Ambulatory Visit (HOSPITAL_BASED_OUTPATIENT_CLINIC_OR_DEPARTMENT_OTHER): Payer: Self-pay

## 2024-04-13 MED ORDER — INSULIN PEN NEEDLE 31G X 5 MM MISC
1 refills | Status: AC
Start: 2024-04-13 — End: ?
  Filled 2024-04-13: qty 100, 30d supply, fill #0
  Filled 2024-05-17: qty 100, 30d supply, fill #1

## 2024-04-14 ENCOUNTER — Other Ambulatory Visit (HOSPITAL_BASED_OUTPATIENT_CLINIC_OR_DEPARTMENT_OTHER): Payer: Self-pay

## 2024-04-15 ENCOUNTER — Other Ambulatory Visit (HOSPITAL_BASED_OUTPATIENT_CLINIC_OR_DEPARTMENT_OTHER): Payer: Self-pay

## 2024-04-18 ENCOUNTER — Other Ambulatory Visit (HOSPITAL_BASED_OUTPATIENT_CLINIC_OR_DEPARTMENT_OTHER): Payer: Self-pay

## 2024-04-20 ENCOUNTER — Other Ambulatory Visit: Payer: Self-pay

## 2024-04-20 ENCOUNTER — Other Ambulatory Visit (HOSPITAL_BASED_OUTPATIENT_CLINIC_OR_DEPARTMENT_OTHER): Payer: Self-pay

## 2024-04-20 MED ORDER — GABAPENTIN 300 MG PO CAPS
300.0000 mg | ORAL_CAPSULE | Freq: Every day | ORAL | 0 refills | Status: DC
  Filled 2024-04-20: qty 30, 30d supply, fill #0

## 2024-04-21 ENCOUNTER — Other Ambulatory Visit (HOSPITAL_BASED_OUTPATIENT_CLINIC_OR_DEPARTMENT_OTHER): Payer: Self-pay

## 2024-05-17 ENCOUNTER — Other Ambulatory Visit (HOSPITAL_BASED_OUTPATIENT_CLINIC_OR_DEPARTMENT_OTHER): Payer: Self-pay

## 2024-05-17 ENCOUNTER — Other Ambulatory Visit: Payer: Self-pay

## 2024-05-18 ENCOUNTER — Inpatient Hospital Stay (HOSPITAL_BASED_OUTPATIENT_CLINIC_OR_DEPARTMENT_OTHER): Admitting: Family

## 2024-05-18 ENCOUNTER — Other Ambulatory Visit (HOSPITAL_BASED_OUTPATIENT_CLINIC_OR_DEPARTMENT_OTHER): Payer: Self-pay

## 2024-05-18 ENCOUNTER — Inpatient Hospital Stay: Attending: Family

## 2024-05-18 DIAGNOSIS — K909 Intestinal malabsorption, unspecified: Secondary | ICD-10-CM | POA: Diagnosis not present

## 2024-05-18 DIAGNOSIS — D500 Iron deficiency anemia secondary to blood loss (chronic): Secondary | ICD-10-CM | POA: Insufficient documentation

## 2024-05-18 DIAGNOSIS — K922 Gastrointestinal hemorrhage, unspecified: Secondary | ICD-10-CM | POA: Diagnosis not present

## 2024-05-18 LAB — IRON AND IRON BINDING CAPACITY (CC-WL,HP ONLY)
Iron: 52 ug/dL (ref 45–182)
Saturation Ratios: 16 % — ABNORMAL LOW (ref 17.9–39.5)
TIBC: 329 ug/dL (ref 250–450)
UIBC: 277 ug/dL (ref 117–376)

## 2024-05-18 LAB — CBC WITH DIFFERENTIAL (CANCER CENTER ONLY)
Abs Immature Granulocytes: 0.06 10*3/uL (ref 0.00–0.07)
Basophils Absolute: 0.1 10*3/uL (ref 0.0–0.1)
Basophils Relative: 1 %
Eosinophils Absolute: 0.1 10*3/uL (ref 0.0–0.5)
Eosinophils Relative: 2 %
HCT: 38.7 % — ABNORMAL LOW (ref 39.0–52.0)
Hemoglobin: 13 g/dL (ref 13.0–17.0)
Immature Granulocytes: 1 %
Lymphocytes Relative: 18 %
Lymphs Abs: 1.4 10*3/uL (ref 0.7–4.0)
MCH: 31.3 pg (ref 26.0–34.0)
MCHC: 33.6 g/dL (ref 30.0–36.0)
MCV: 93.3 fL (ref 80.0–100.0)
Monocytes Absolute: 0.6 10*3/uL (ref 0.1–1.0)
Monocytes Relative: 8 %
Neutro Abs: 5.4 10*3/uL (ref 1.7–7.7)
Neutrophils Relative %: 70 %
Platelet Count: 239 10*3/uL (ref 150–400)
RBC: 4.15 MIL/uL — ABNORMAL LOW (ref 4.22–5.81)
RDW: 13.1 % (ref 11.5–15.5)
WBC Count: 7.6 10*3/uL (ref 4.0–10.5)
nRBC: 0 % (ref 0.0–0.2)

## 2024-05-18 LAB — RETICULOCYTES
Immature Retic Fract: 10 % (ref 2.3–15.9)
RBC.: 4.19 MIL/uL — ABNORMAL LOW (ref 4.22–5.81)
Retic Count, Absolute: 55.7 10*3/uL (ref 19.0–186.0)
Retic Ct Pct: 1.3 % (ref 0.4–3.1)

## 2024-05-18 LAB — FERRITIN: Ferritin: 361 ng/mL — ABNORMAL HIGH (ref 24–336)

## 2024-05-18 NOTE — Progress Notes (Signed)
 Hematology and Oncology Follow Up Visit  Adam Quinn [id number] [birth year] 68 y.o. 05/18/2024   Principle Diagnosis:  Iron deficiency anemia Erythropoietin  deficiency anemia   Current Therapy:        IV iron as indicated- Feraheme - 08/27/2023   Interim History:  Adam Quinn is here today for follow-up. He is doing well but has noted some fatigue recently.  He had some diarrhea last week with blood noted in it but none since that time.  No other blood loss. No abnormal bruising. He bruises easily on the arms and legs with Plavix . He will try taking vitamin C.  No fever, chills, n/v, cough, rash, chest pain, palpitations, abdominal pain or changes in bowel or bladder habits.  He has occasional dizziness with vertigo and some SOB that is mild with over exertion.  No falls or syncope reported.  No swelling in his extremities at this time.  Neuropathy in his hands and feet unchanged from baseline. Appetite and hydration are good. Weight is stable at 183 lbs.  ECOG Performance Status: 1 - Symptomatic but completely ambulatory  Medications:  Allergies as of 05/18/2024       Reactions   Aspirin Anaphylaxis, Hives   Shrimp [shellfish Allergy] Hives   Just shrimp        Medication List        Accurate as of May 18, 2024  2:00 PM. If you have any questions, ask your nurse or doctor.          Alavert  D-12 Hour Allergy/Cong 5-120 MG tablet Generic drug: loratadine -pseudoephedrine Take 1 tablet by mouth at bedtime.   bimatoprost  0.03 % ophthalmic solution Commonly known as: LUMIGAN  Place 1 drop into both eyes at bedtime.   Lumigan  0.01 % Soln Generic drug: bimatoprost  Place 1 drop into both eyes at bedtime.   clopidogrel  75 MG tablet Commonly known as: PLAVIX  Take 1 tablet (75 mg total) by mouth daily.   dorzolamide  2 % ophthalmic solution Commonly known as: TRUSOPT  Place 1 drop into both eyes 2 (two) times daily.   ELDERBERRY PO Take 1 tablet by mouth at  bedtime. Gummy   FIBER PO Take 1 tablet by mouth daily. Miralax  brand fiber gummy.   FreeStyle Libre 2 Sensor Misc Place on arm to read blood sugar continuously and change every 14 (fourteen) days.   gabapentin  300 MG capsule Commonly known as: NEURONTIN  Take 1 capsule (300 mg total) by mouth daily.   gabapentin  300 MG capsule Commonly known as: NEURONTIN  Take one capsule (300 mg) by mouth once a day.   HYDROcodone -acetaminophen  5-325 MG tablet Commonly known as: NORCO/VICODIN Take 1 tablet by mouth every 8 (eight) hours as needed for moderate pain.   ibuprofen  800 MG tablet Commonly known as: IBU TAKE 1 TABLET BY MOUTH EVERY 6 TO 8 HOURS AS NEEDED.   Insupen Pen Needles 31G X 5 MM Misc Generic drug: Insulin  Pen Needle Use 1 needle to inject Tresiba  under the skin once daily as directed.   TechLite Pen Needles 31G X 5 MM Misc Generic drug: Insulin  Pen Needle Use as directed with tresiba .   lansoprazole  30 MG capsule Commonly known as: PREVACID  Take 1 capsule (30 mg total) by mouth 2 (two) times daily.   losartan  25 MG tablet Commonly known as: COZAAR  Take 1 tablet (25 mg total) by mouth daily for kidney protection.   Meclizine  HCl 25 MG Chew 1 tablet as needed Orally every 12 hrs   Melatonin 5 MG Chew Chew  5 mg by mouth at bedtime.   metFORMIN  500 MG 24 hr tablet Commonly known as: GLUCOPHAGE -XR Take 2 tablets (1,000 mg total) by mouth 2 (two) times daily.   RA PROBIOTIC GUMMIES PO Take 1 capsule by mouth at bedtime.   sertraline  100 MG tablet Commonly known as: ZOLOFT  Take 1 tablet (100 mg total) by mouth daily.   simvastatin  20 MG tablet Commonly known as: ZOCOR  Take 1 tablet (20 mg total) by mouth daily.   sodium bicarbonate  650 MG tablet Take 1 tablet (650 mg total) by mouth 2 (two) times daily.   tamsulosin  0.4 MG Caps capsule Commonly known as: FLOMAX  Take 1 capsule (0.4 mg total) by mouth at bedtime.   Tresiba  FlexTouch 200 UNIT/ML FlexTouch  Pen Generic drug: insulin  degludec Inject 30 Units into the skin daily.   Trulicity  1.5 MG/0.5ML Soaj Generic drug: Dulaglutide  Inject under the skin once a week   Trulicity  1.5 MG/0.5ML Soaj Generic drug: Dulaglutide  Inject 1.5 mg into the skin once a week.        Allergies:  Allergies  Allergen Reactions   Aspirin Anaphylaxis and Hives   Shrimp [Shellfish Allergy] Hives    Just shrimp     Past Medical History, Surgical history, Social history, and Family History were reviewed and updated.  Review of Systems: All other 10 point review of systems is negative.   Physical Exam:  vitals were not taken for this visit.   Wt Readings from Last 3 Encounters:  02/16/24 186 lb 1.9 oz (84.4 kg)  11/03/23 186 lb (84.4 kg)  08/04/23 186 lb 12.8 oz (84.7 kg)    Ocular: Sclerae unicteric, pupils equal, round and reactive to light Ear-nose-throat: Oropharynx clear, dentition fair Lymphatic: No cervical or supraclavicular adenopathy Lungs no rales or rhonchi, good excursion bilaterally Heart regular rate and rhythm, no murmur appreciated Abd soft, nontender, positive bowel sounds MSK no focal spinal tenderness, no joint edema Neuro: non-focal, well-oriented, appropriate affect Breasts: Deferred   Lab Results  Component Value Date   WBC 7.6 05/18/2024   HGB 13.0 05/18/2024   HCT 38.7 (L) 05/18/2024   MCV 93.3 05/18/2024   PLT 239 05/18/2024   Lab Results  Component Value Date   FERRITIN 378 (H) 02/16/2024   IRON 75 02/16/2024   TIBC 321 02/16/2024   UIBC 246 02/16/2024   IRONPCTSAT 23 02/16/2024   Lab Results  Component Value Date   RETICCTPCT 1.7 02/16/2024   RBC 4.15 (L) 05/18/2024   No results found for: KPAFRELGTCHN, LAMBDASER, KAPLAMBRATIO No results found for: IGGSERUM, IGA, IGMSERUM No results found for: STEPHANY CARLOTA BENSON MARKEL EARLA JOANNIE DOC VICK, SPEI   Chemistry      Component Value Date/Time    NA 140 07/17/2023 1136   NA 140 05/26/2017 0856   K 5.0 07/17/2023 1136   K 4.2 05/26/2017 0856   CL 104 07/17/2023 1136   CO2 26 07/17/2023 1136   CO2 25 05/26/2017 0856   BUN 18 07/17/2023 1136   BUN 12.7 05/26/2017 0856   CREATININE 1.20 12/29/2023 1243   CREATININE 1.32 (H) 06/13/2022 1027   CREATININE 1.2 05/26/2017 0856      Component Value Date/Time   CALCIUM 9.6 07/17/2023 1136   CALCIUM 9.5 05/26/2017 0856   ALKPHOS 84 12/02/2022 0938   ALKPHOS 83 05/26/2017 0856   AST 11 12/02/2022 0938   AST 13 (L) 06/13/2022 1027   AST 16 05/26/2017 0856   ALT 15 12/02/2022 0938   ALT  15 06/13/2022 1027   ALT 22 05/26/2017 0856   BILITOT 0.3 12/02/2022 0938   BILITOT 0.2 (L) 06/13/2022 1027   BILITOT 0.28 05/26/2017 0856       Impression and Plan: Mr. 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   Iron studies are pending. We will replace if needed.  Follow-up in 3 months.   Lauraine Pepper, NP 6/25/20252:00 PM

## 2024-05-19 ENCOUNTER — Other Ambulatory Visit (HOSPITAL_BASED_OUTPATIENT_CLINIC_OR_DEPARTMENT_OTHER): Payer: Self-pay

## 2024-05-19 MED ORDER — GABAPENTIN 300 MG PO CAPS
300.0000 mg | ORAL_CAPSULE | Freq: Every day | ORAL | 0 refills | Status: DC
  Filled 2024-05-20: qty 30, 30d supply, fill #0

## 2024-05-20 ENCOUNTER — Other Ambulatory Visit (HOSPITAL_BASED_OUTPATIENT_CLINIC_OR_DEPARTMENT_OTHER): Payer: Self-pay

## 2024-05-23 ENCOUNTER — Other Ambulatory Visit (HOSPITAL_BASED_OUTPATIENT_CLINIC_OR_DEPARTMENT_OTHER): Payer: Self-pay

## 2024-05-23 ENCOUNTER — Encounter: Payer: Self-pay | Admitting: Family

## 2024-05-25 DIAGNOSIS — E1121 Type 2 diabetes mellitus with diabetic nephropathy: Secondary | ICD-10-CM | POA: Diagnosis not present

## 2024-05-25 DIAGNOSIS — D509 Iron deficiency anemia, unspecified: Secondary | ICD-10-CM | POA: Diagnosis not present

## 2024-05-25 DIAGNOSIS — Z794 Long term (current) use of insulin: Secondary | ICD-10-CM | POA: Diagnosis not present

## 2024-05-25 DIAGNOSIS — I6930 Unspecified sequelae of cerebral infarction: Secondary | ICD-10-CM | POA: Diagnosis not present

## 2024-05-25 DIAGNOSIS — G629 Polyneuropathy, unspecified: Secondary | ICD-10-CM | POA: Diagnosis not present

## 2024-05-25 DIAGNOSIS — E11319 Type 2 diabetes mellitus with unspecified diabetic retinopathy without macular edema: Secondary | ICD-10-CM | POA: Diagnosis not present

## 2024-05-25 DIAGNOSIS — F39 Unspecified mood [affective] disorder: Secondary | ICD-10-CM | POA: Diagnosis not present

## 2024-05-25 DIAGNOSIS — N1831 Chronic kidney disease, stage 3a: Secondary | ICD-10-CM | POA: Diagnosis not present

## 2024-05-25 DIAGNOSIS — I2510 Atherosclerotic heart disease of native coronary artery without angina pectoris: Secondary | ICD-10-CM | POA: Diagnosis not present

## 2024-05-25 DIAGNOSIS — E785 Hyperlipidemia, unspecified: Secondary | ICD-10-CM | POA: Diagnosis not present

## 2024-05-25 DIAGNOSIS — I700 Atherosclerosis of aorta: Secondary | ICD-10-CM | POA: Diagnosis not present

## 2024-05-25 DIAGNOSIS — K869 Disease of pancreas, unspecified: Secondary | ICD-10-CM | POA: Diagnosis not present

## 2024-05-26 ENCOUNTER — Other Ambulatory Visit: Payer: Self-pay

## 2024-05-26 ENCOUNTER — Other Ambulatory Visit (HOSPITAL_BASED_OUTPATIENT_CLINIC_OR_DEPARTMENT_OTHER): Payer: Self-pay

## 2024-05-26 MED ORDER — CLOPIDOGREL BISULFATE 75 MG PO TABS
75.0000 mg | ORAL_TABLET | Freq: Every day | ORAL | 3 refills | Status: AC
  Filled 2024-05-26: qty 90, 90d supply, fill #0
  Filled 2024-09-01: qty 90, 90d supply, fill #1
  Filled 2024-11-28: qty 90, 90d supply, fill #2

## 2024-05-26 MED ORDER — LANSOPRAZOLE 30 MG PO CPDR
30.0000 mg | DELAYED_RELEASE_CAPSULE | Freq: Two times a day (BID) | ORAL | 3 refills | Status: AC
  Filled 2024-05-26: qty 180, 90d supply, fill #0
  Filled 2024-08-20: qty 180, 90d supply, fill #1
  Filled 2024-11-23: qty 180, 90d supply, fill #2

## 2024-05-30 ENCOUNTER — Other Ambulatory Visit: Payer: Self-pay

## 2024-06-02 ENCOUNTER — Other Ambulatory Visit: Payer: Self-pay

## 2024-06-02 ENCOUNTER — Inpatient Hospital Stay: Attending: Family

## 2024-06-02 ENCOUNTER — Other Ambulatory Visit (HOSPITAL_BASED_OUTPATIENT_CLINIC_OR_DEPARTMENT_OTHER): Payer: Self-pay

## 2024-06-02 VITALS — BP 115/48 | HR 78 | Temp 98.0°F | Resp 18

## 2024-06-02 DIAGNOSIS — D500 Iron deficiency anemia secondary to blood loss (chronic): Secondary | ICD-10-CM | POA: Diagnosis not present

## 2024-06-02 DIAGNOSIS — K922 Gastrointestinal hemorrhage, unspecified: Secondary | ICD-10-CM | POA: Insufficient documentation

## 2024-06-02 DIAGNOSIS — K909 Intestinal malabsorption, unspecified: Secondary | ICD-10-CM | POA: Diagnosis not present

## 2024-06-02 MED ORDER — METFORMIN HCL ER 500 MG PO TB24
1000.0000 mg | ORAL_TABLET | Freq: Two times a day (BID) | ORAL | 3 refills | Status: AC
  Filled 2024-06-02: qty 360, 90d supply, fill #0
  Filled 2024-09-01: qty 360, 90d supply, fill #1
  Filled 2024-12-01: qty 360, 90d supply, fill #2

## 2024-06-02 MED ORDER — SODIUM CHLORIDE 0.9 % IV SOLN: INTRAVENOUS | Status: DC

## 2024-06-02 MED ORDER — SODIUM CHLORIDE 0.9 % IV SOLN
510.0000 mg | Freq: Once | INTRAVENOUS | Status: AC
  Administered 2024-06-02: 510 mg via INTRAVENOUS
  Filled 2024-06-02: qty 510

## 2024-06-02 NOTE — Patient Instructions (Signed)

## 2024-06-09 ENCOUNTER — Other Ambulatory Visit (HOSPITAL_BASED_OUTPATIENT_CLINIC_OR_DEPARTMENT_OTHER): Payer: Self-pay

## 2024-06-09 ENCOUNTER — Inpatient Hospital Stay

## 2024-06-09 VITALS — BP 111/45 | HR 91 | Temp 97.5°F | Resp 20

## 2024-06-09 DIAGNOSIS — D500 Iron deficiency anemia secondary to blood loss (chronic): Secondary | ICD-10-CM | POA: Diagnosis not present

## 2024-06-09 MED ORDER — SODIUM CHLORIDE 0.9 % IV SOLN: INTRAVENOUS | Status: DC

## 2024-06-09 MED ORDER — LOSARTAN POTASSIUM 25 MG PO TABS
25.0000 mg | ORAL_TABLET | Freq: Every day | ORAL | 3 refills | Status: AC
  Filled 2024-06-09: qty 90, 90d supply, fill #0
  Filled 2024-09-05: qty 90, 90d supply, fill #1
  Filled 2024-12-01: qty 90, 90d supply, fill #2

## 2024-06-09 MED ORDER — SODIUM CHLORIDE 0.9 % IV SOLN
510.0000 mg | Freq: Once | INTRAVENOUS | Status: AC
  Administered 2024-06-09: 510 mg via INTRAVENOUS
  Filled 2024-06-09: qty 510

## 2024-06-09 NOTE — Progress Notes (Signed)
Pt refused to stay for 30 minutes post iron infusion and is without complaints at time of discharge.

## 2024-06-13 DIAGNOSIS — H401132 Primary open-angle glaucoma, bilateral, moderate stage: Secondary | ICD-10-CM | POA: Diagnosis not present

## 2024-06-15 ENCOUNTER — Other Ambulatory Visit (HOSPITAL_BASED_OUTPATIENT_CLINIC_OR_DEPARTMENT_OTHER): Payer: Self-pay

## 2024-07-03 ENCOUNTER — Other Ambulatory Visit (HOSPITAL_BASED_OUTPATIENT_CLINIC_OR_DEPARTMENT_OTHER): Payer: Self-pay

## 2024-07-04 ENCOUNTER — Other Ambulatory Visit: Payer: Self-pay

## 2024-07-04 ENCOUNTER — Other Ambulatory Visit (HOSPITAL_BASED_OUTPATIENT_CLINIC_OR_DEPARTMENT_OTHER): Payer: Self-pay

## 2024-07-04 MED ORDER — SIMVASTATIN 20 MG PO TABS
20.0000 mg | ORAL_TABLET | Freq: Every evening | ORAL | 3 refills | Status: AC
  Filled 2024-07-04: qty 90, 90d supply, fill #0
  Filled 2024-09-05 – 2024-09-27 (×2): qty 90, 90d supply, fill #1
  Filled 2024-12-27: qty 90, 90d supply, fill #2

## 2024-07-04 MED ORDER — TRESIBA FLEXTOUCH 200 UNIT/ML ~~LOC~~ SOPN
30.0000 [IU] | PEN_INJECTOR | Freq: Every day | SUBCUTANEOUS | 5 refills | Status: AC
  Filled 2024-07-04: qty 9, 60d supply, fill #0
  Filled 2024-09-01: qty 9, 60d supply, fill #1
  Filled 2024-10-27: qty 9, 60d supply, fill #2
  Filled 2024-12-27: qty 9, 60d supply, fill #3

## 2024-07-04 MED ORDER — GABAPENTIN 300 MG PO CAPS
300.0000 mg | ORAL_CAPSULE | Freq: Every day | ORAL | 3 refills | Status: DC
  Filled 2024-07-04: qty 30, 30d supply, fill #0

## 2024-07-15 ENCOUNTER — Other Ambulatory Visit: Payer: Self-pay

## 2024-07-15 ENCOUNTER — Other Ambulatory Visit (HOSPITAL_BASED_OUTPATIENT_CLINIC_OR_DEPARTMENT_OTHER): Payer: Self-pay

## 2024-07-15 ENCOUNTER — Encounter: Payer: Self-pay | Admitting: Family

## 2024-07-15 MED ORDER — TRULICITY 1.5 MG/0.5ML ~~LOC~~ SOAJ
1.5000 mg | SUBCUTANEOUS | 5 refills | Status: AC
  Filled 2024-07-15: qty 6, 84d supply, fill #0
  Filled 2024-10-06: qty 6, 84d supply, fill #1
  Filled 2024-12-27: qty 6, 84d supply, fill #2

## 2024-07-15 MED ORDER — SODIUM BICARBONATE 650 MG PO TABS
650.0000 mg | ORAL_TABLET | Freq: Two times a day (BID) | ORAL | 2 refills | Status: AC
  Filled 2024-07-15: qty 180, 90d supply, fill #0
  Filled 2024-10-11: qty 180, 90d supply, fill #1

## 2024-07-18 ENCOUNTER — Ambulatory Visit (INDEPENDENT_AMBULATORY_CARE_PROVIDER_SITE_OTHER): Admitting: Podiatry

## 2024-07-18 ENCOUNTER — Encounter: Payer: Self-pay | Admitting: Podiatry

## 2024-07-18 DIAGNOSIS — M79675 Pain in left toe(s): Secondary | ICD-10-CM | POA: Diagnosis not present

## 2024-07-18 DIAGNOSIS — D689 Coagulation defect, unspecified: Secondary | ICD-10-CM | POA: Diagnosis not present

## 2024-07-18 DIAGNOSIS — E1142 Type 2 diabetes mellitus with diabetic polyneuropathy: Secondary | ICD-10-CM | POA: Diagnosis not present

## 2024-07-18 DIAGNOSIS — M79674 Pain in right toe(s): Secondary | ICD-10-CM

## 2024-07-18 DIAGNOSIS — B351 Tinea unguium: Secondary | ICD-10-CM | POA: Diagnosis not present

## 2024-07-18 NOTE — Progress Notes (Signed)
 This patient returns to my office for at risk foot care.  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  This patient is unable to cut nails himself since the patient cannot reach his nails.These nails are painful walking and wearing shoes.  This patient presents for at risk foot care today.  General Appearance  Alert, conversant and in no acute stress.  Vascular  Dorsalis pedis and posterior tibial  pulses are palpable  bilaterally.  Capillary return is within normal limits  bilaterally. Temperature is within normal limits  bilaterally.  Neurologic  Senn-Weinstein monofilament wire test absent  bilaterally. Muscle power within normal limits bilaterally.  Nails Thick disfigured discolored nails with subungual debris  Hallux nails  B/L. No evidence of bacterial infection or drainage bilaterally.  Orthopedic  No limitations of motion  feet .  No crepitus or effusions noted.  Hammer toes 2-4  B/L. Hallux malleus  B/L.  Cavus foot  B/L.  Skin  normotropic skin with no porokeratosis noted bilaterally.  No signs of infections or ulcers noted.     Onychomycosis  Pain in right toes  Pain in left toes  Consent was obtained for treatment procedures.   Mechanical debridement of hallux nails  bilaterally performed with a nail nipper.  Filed with dremel without incident.    Return office visit   4   months                  Told patient to return for periodic foot care and evaluation due to potential at risk complications.   Ruffin Cotton DPM

## 2024-07-21 ENCOUNTER — Other Ambulatory Visit: Payer: Self-pay

## 2024-07-21 ENCOUNTER — Telehealth: Payer: Self-pay | Admitting: Gastroenterology

## 2024-07-21 DIAGNOSIS — K869 Disease of pancreas, unspecified: Secondary | ICD-10-CM

## 2024-07-21 DIAGNOSIS — R935 Abnormal findings on diagnostic imaging of other abdominal regions, including retroperitoneum: Secondary | ICD-10-CM

## 2024-07-21 NOTE — Telephone Encounter (Signed)
 Spoke with the patient. Advised him he will be contacted by radiology scheduling to arrange the 6 month re-imaging of the pancreas. Last had CT. An MRCP has been ordered as per Dr Shila.

## 2024-07-21 NOTE — Telephone Encounter (Signed)
 Inbound call from patient requesting a call to discuss possibly scheduled an follow up MRCP. States in February he was advised he would be contact to schedule 6 month follow up but has not heard anything. Patient requesting a call back. Please advise, thank you

## 2024-07-26 ENCOUNTER — Encounter: Payer: Self-pay | Admitting: Sports Medicine

## 2024-07-29 ENCOUNTER — Other Ambulatory Visit (HOSPITAL_BASED_OUTPATIENT_CLINIC_OR_DEPARTMENT_OTHER): Payer: Self-pay

## 2024-07-29 MED ORDER — TAMSULOSIN HCL 0.4 MG PO CAPS
0.4000 mg | ORAL_CAPSULE | Freq: Every day | ORAL | 3 refills | Status: DC
  Filled 2024-07-29: qty 90, 90d supply, fill #0

## 2024-08-01 ENCOUNTER — Other Ambulatory Visit (HOSPITAL_BASED_OUTPATIENT_CLINIC_OR_DEPARTMENT_OTHER): Payer: Self-pay

## 2024-08-08 ENCOUNTER — Other Ambulatory Visit (HOSPITAL_BASED_OUTPATIENT_CLINIC_OR_DEPARTMENT_OTHER): Payer: Self-pay

## 2024-08-08 DIAGNOSIS — N401 Enlarged prostate with lower urinary tract symptoms: Secondary | ICD-10-CM | POA: Diagnosis not present

## 2024-08-08 DIAGNOSIS — R3912 Poor urinary stream: Secondary | ICD-10-CM | POA: Diagnosis not present

## 2024-08-08 MED ORDER — TAMSULOSIN HCL 0.4 MG PO CAPS
0.4000 mg | ORAL_CAPSULE | Freq: Every day | ORAL | 3 refills | Status: AC
  Filled 2024-08-08: qty 90, 365d supply, fill #0
  Filled 2024-09-01 – 2024-10-27 (×3): qty 90, 90d supply, fill #0

## 2024-08-10 ENCOUNTER — Other Ambulatory Visit (HOSPITAL_BASED_OUTPATIENT_CLINIC_OR_DEPARTMENT_OTHER): Payer: Self-pay

## 2024-08-10 ENCOUNTER — Other Ambulatory Visit: Payer: Self-pay

## 2024-08-10 MED ORDER — GABAPENTIN 300 MG PO CAPS
300.0000 mg | ORAL_CAPSULE | Freq: Every day | ORAL | 3 refills | Status: DC
  Filled 2024-08-10: qty 30, 30d supply, fill #0
  Filled 2024-09-05: qty 30, 30d supply, fill #1
  Filled 2024-10-06: qty 30, 30d supply, fill #2
  Filled 2024-11-07: qty 30, 30d supply, fill #3

## 2024-08-11 ENCOUNTER — Other Ambulatory Visit (HOSPITAL_BASED_OUTPATIENT_CLINIC_OR_DEPARTMENT_OTHER): Payer: Self-pay

## 2024-08-15 ENCOUNTER — Other Ambulatory Visit (HOSPITAL_BASED_OUTPATIENT_CLINIC_OR_DEPARTMENT_OTHER): Payer: Self-pay

## 2024-08-15 MED ORDER — FREESTYLE LIBRE 3 PLUS SENSOR MISC
1.0000 | 3 refills | Status: DC
  Filled 2024-08-15: qty 2, 30d supply, fill #0
  Filled 2024-09-12: qty 2, 30d supply, fill #1
  Filled 2024-10-21: qty 2, 30d supply, fill #2
  Filled 2024-11-17: qty 2, 30d supply, fill #3

## 2024-08-17 ENCOUNTER — Inpatient Hospital Stay (HOSPITAL_BASED_OUTPATIENT_CLINIC_OR_DEPARTMENT_OTHER): Admitting: Family

## 2024-08-17 ENCOUNTER — Inpatient Hospital Stay: Attending: Family

## 2024-08-17 ENCOUNTER — Encounter: Payer: Self-pay | Admitting: Family

## 2024-08-17 VITALS — BP 92/46 | HR 94 | Temp 98.2°F | Resp 20 | Ht 68.0 in | Wt 182.4 lb

## 2024-08-17 DIAGNOSIS — D500 Iron deficiency anemia secondary to blood loss (chronic): Secondary | ICD-10-CM | POA: Diagnosis not present

## 2024-08-17 DIAGNOSIS — K922 Gastrointestinal hemorrhage, unspecified: Secondary | ICD-10-CM | POA: Diagnosis not present

## 2024-08-17 DIAGNOSIS — K909 Intestinal malabsorption, unspecified: Secondary | ICD-10-CM | POA: Diagnosis not present

## 2024-08-17 LAB — CBC WITH DIFFERENTIAL (CANCER CENTER ONLY)
Abs Immature Granulocytes: 0.03 K/uL (ref 0.00–0.07)
Basophils Absolute: 0.1 K/uL (ref 0.0–0.1)
Basophils Relative: 1 %
Eosinophils Absolute: 0.1 K/uL (ref 0.0–0.5)
Eosinophils Relative: 2 %
HCT: 40.6 % (ref 39.0–52.0)
Hemoglobin: 14.1 g/dL (ref 13.0–17.0)
Immature Granulocytes: 0 %
Lymphocytes Relative: 16 %
Lymphs Abs: 1.3 K/uL (ref 0.7–4.0)
MCH: 32.3 pg (ref 26.0–34.0)
MCHC: 34.7 g/dL (ref 30.0–36.0)
MCV: 93.1 fL (ref 80.0–100.0)
Monocytes Absolute: 0.7 K/uL (ref 0.1–1.0)
Monocytes Relative: 9 %
Neutro Abs: 6 K/uL (ref 1.7–7.7)
Neutrophils Relative %: 72 %
Platelet Count: 217 K/uL (ref 150–400)
RBC: 4.36 MIL/uL (ref 4.22–5.81)
RDW: 13.4 % (ref 11.5–15.5)
WBC Count: 8.2 K/uL (ref 4.0–10.5)
nRBC: 0 % (ref 0.0–0.2)

## 2024-08-17 LAB — FERRITIN: Ferritin: 945 ng/mL — ABNORMAL HIGH (ref 24–336)

## 2024-08-17 LAB — IRON AND IRON BINDING CAPACITY (CC-WL,HP ONLY)
Iron: 68 ug/dL (ref 45–182)
Saturation Ratios: 22 % (ref 17.9–39.5)
TIBC: 309 ug/dL (ref 250–450)
UIBC: 241 ug/dL

## 2024-08-17 LAB — RETICULOCYTES
Immature Retic Fract: 10.2 % (ref 2.3–15.9)
RBC.: 4.35 MIL/uL (ref 4.22–5.81)
Retic Count, Absolute: 63.1 K/uL (ref 19.0–186.0)
Retic Ct Pct: 1.5 % (ref 0.4–3.1)

## 2024-08-17 NOTE — Progress Notes (Signed)
 Hematology and Oncology Follow Up Visit  Adam Quinn [id number] 05/23/1956 68 y.o. 08/17/2024   Principle Diagnosis:  Iron deficiency anemia Erythropoietin  deficiency anemia   Current Therapy:        IV iron as indicated- Feraheme - 08/27/2023   Interim History:  Adam Quinn is here today for follow-up. He recently got over Covid and is finally feeling an improvement in his energy.  His last dose of Feraheme  was in July. 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 We will hold off on treating him for this. He cannot have gotten Feraheme  within 3 months of having an MRI.  No obvious blood loss noted.  No abnormal bruising, no petechiae.  Hgb is improved at 14.1, MCV 93 and platelets 217.  He had an episode of vertigo last week.  No fever, chills, n/v, cough, rash, dizziness, SOB, chest pain, palpitations, abdominal pain or changes in bowel or bladder habits at this time.  No swelling in his extremities.  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  No falls or syncope reported.  Appetite and hydration are good. Weight is stable at 182 lbs.   ECOG Performance Status: 1 - Symptomatic but completely ambulatory  Medications:  Allergies as of 08/17/2024       Reactions   Aspirin Anaphylaxis, Hives   Shrimp [shellfish Allergy] Hives   Just shrimp        Medication List        Accurate as of August 17, 2024  1:28 PM. If you have any questions, ask your nurse or doctor.          Alavert  D-12 Hour Allergy/Cong 5-120 MG tablet Generic drug: loratadine -pseudoephedrine Take 1 tablet by mouth at bedtime.   bimatoprost  0.03 % ophthalmic solution Commonly known as: LUMIGAN  Place 1 drop into both eyes at bedtime.   Lumigan  0.01 % Soln Generic drug: bimatoprost  Place 1 drop into both eyes at bedtime.   clopidogrel  75 MG tablet Commonly known as: PLAVIX  Take 1 tablet (75 mg total) by mouth daily.   dorzolamide  2  % ophthalmic solution Commonly known as: TRUSOPT  Place 1 drop into both eyes 2 (two) times daily.   ELDERBERRY PO Take 1 tablet by mouth at bedtime. Gummy   FIBER PO Take 1 tablet by mouth daily. Miralax  brand fiber gummy.   FreeStyle Libre 2 Sensor Misc Place on arm to read blood sugar continuously and change every 14 (fourteen) days.   FreeStyle Libre 3 Plus Sensor Misc Apply to arm as directed to read blood sugar continuously, and change every 15 days.   gabapentin  300 MG capsule Commonly known as: NEURONTIN  Take 1 capsule (300 mg total) by mouth daily.   gabapentin  300 MG capsule Commonly known as: NEURONTIN  Take 1 capsule (300 mg total) by mouth daily.   gabapentin  300 MG capsule Commonly known as: NEURONTIN  Take 1 capsule (300 mg total) by mouth daily.   HYDROcodone -acetaminophen  5-325 MG tablet Commonly known as: NORCO/VICODIN Take 1 tablet by mouth every 8 (eight) hours as needed for moderate pain.   ibuprofen  800 MG tablet Commonly known as: IBU TAKE 1 TABLET BY MOUTH EVERY 6 TO 8 HOURS AS NEEDED.   Insupen Pen Needles 31G X 5 MM Misc Generic drug: Insulin  Pen Needle Use 1 needle to inject Tresiba  under the skin once daily as directed.   TechLite Pen Needles 31G X 5 MM Misc Generic drug: Insulin  Pen  Needle Use as directed with tresiba .   lansoprazole  30 MG capsule Commonly known as: PREVACID  Take 1 capsule (30 mg total) by mouth 2 (two) times daily.   losartan  25 MG tablet Commonly known as: COZAAR  Take 1 tablet (25 mg total) by mouth daily for kidney protection.   Meclizine  HCl 25 MG Chew 1 tablet as needed Orally every 12 hrs   Melatonin 5 MG Chew Chew 5 mg by mouth at bedtime.   metFORMIN  500 MG 24 hr tablet Commonly known as: GLUCOPHAGE -XR Take 2 tablets (1,000 mg total) by mouth 2 (two) times daily.   RA PROBIOTIC GUMMIES PO Take 1 capsule by mouth at bedtime.   sertraline  100 MG tablet Commonly known as: ZOLOFT  Take 1 tablet (100 mg  total) by mouth daily.   simvastatin  20 MG tablet Commonly known as: ZOCOR  Take 1 tablet (20 mg total) by mouth every evening.   sodium bicarbonate  650 MG tablet Take 1 tablet (650 mg total) by mouth 2 (two) times daily.   tamsulosin  0.4 MG Caps capsule Commonly known as: FLOMAX  Take 1 capsule (0.4 mg total) by mouth at bedtime.   tamsulosin  0.4 MG Caps capsule Commonly known as: FLOMAX  Take 1 capsule (0.4 mg) by mouth daily at bedtime.   Tresiba  FlexTouch 200 UNIT/ML FlexTouch Pen Generic drug: insulin  degludec Inject 30 Units into the skin daily.   Trulicity  1.5 MG/0.5ML Soaj Generic drug: Dulaglutide  Inject under the skin once a week   Trulicity  1.5 MG/0.5ML Soaj Generic drug: Dulaglutide  Inject 1.5 mg into the skin once a week.        Allergies:  Allergies  Allergen Reactions   Aspirin Anaphylaxis and Hives   Shrimp [Shellfish Allergy] Hives    Just shrimp     Past Medical History, Surgical history, Social history, and Family History were reviewed and updated.  Review of Systems: All other 10 point review of systems is negative.   Physical Exam:  vitals were not taken for this visit.   Wt Readings from Last 3 Encounters:  02/16/24 186 lb 1.9 oz (84.4 kg)  11/03/23 186 lb (84.4 kg)  08/04/23 186 lb 12.8 oz (84.7 kg)    Ocular: Sclerae unicteric, pupils equal, round and reactive to light Ear-nose-throat: Oropharynx clear, dentition fair Lymphatic: No cervical or supraclavicular adenopathy Lungs no rales or rhonchi, good excursion bilaterally Heart regular rate and rhythm, no murmur appreciated Abd soft, nontender, positive bowel sounds MSK no focal spinal tenderness, no joint edema Neuro: non-focal, well-oriented, appropriate affect Breasts: Deferred   Lab Results  Component Value Date   WBC 7.6 05/18/2024   HGB 13.0 05/18/2024   HCT 38.7 (L) 05/18/2024   MCV 93.3 05/18/2024   PLT 239 05/18/2024   Lab Results  Component Value Date    FERRITIN 361 (H) 05/18/2024   IRON 52 05/18/2024   TIBC 329 05/18/2024   UIBC 277 05/18/2024   IRONPCTSAT 16 (L) 05/18/2024   Lab Results  Component Value Date   RETICCTPCT 1.3 05/18/2024   RBC 4.19 (L) 05/18/2024   No results found for: KPAFRELGTCHN, LAMBDASER, KAPLAMBRATIO No results found for: IGGSERUM, IGA, IGMSERUM No results found for: STEPHANY CARLOTA BENSON MARKEL EARLA JOANNIE DOC VICK, SPEI   Chemistry      Component Value Date/Time   NA 140 07/17/2023 1136   NA 140 05/26/2017 0856   K 5.0 07/17/2023 1136   K 4.2 05/26/2017 0856   CL 104 07/17/2023 1136   CO2 26 07/17/2023 1136  CO2 25 05/26/2017 0856   BUN 18 07/17/2023 1136   BUN 12.7 05/26/2017 0856   CREATININE 1.20 12/29/2023 1243   CREATININE 1.32 (H) 06/13/2022 1027   CREATININE 1.2 05/26/2017 0856      Component Value Date/Time   CALCIUM 9.6 07/17/2023 1136   CALCIUM 9.5 05/26/2017 0856   ALKPHOS 84 12/02/2022 0938   ALKPHOS 83 05/26/2017 0856   AST 11 12/02/2022 0938   AST 13 (L) 06/13/2022 1027   AST 16 05/26/2017 0856   ALT 15 12/02/2022 0938   ALT 15 06/13/2022 1027   ALT 22 05/26/2017 0856   BILITOT 0.3 12/02/2022 0938   BILITOT 0.2 (L) 06/13/2022 1027   BILITOT 0.28 05/26/2017 0856       Impression and Plan: Mr. 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    Iron studies are pending. We will replace if needed.  Follow-up in 3 months.   Lauraine Pepper, NP 9/24/20251:28 PM

## 2024-08-22 ENCOUNTER — Other Ambulatory Visit (HOSPITAL_BASED_OUTPATIENT_CLINIC_OR_DEPARTMENT_OTHER): Payer: Self-pay

## 2024-08-23 ENCOUNTER — Other Ambulatory Visit (HOSPITAL_BASED_OUTPATIENT_CLINIC_OR_DEPARTMENT_OTHER): Payer: Self-pay

## 2024-08-23 MED ORDER — LUMIGAN 0.01 % OP SOLN
1.0000 [drp] | Freq: Every day | OPHTHALMIC | 2 refills | Status: DC
  Filled 2024-08-23: qty 2.5, 25d supply, fill #0
  Filled 2024-09-21: qty 7.5, 75d supply, fill #0

## 2024-08-29 ENCOUNTER — Encounter: Payer: Self-pay | Admitting: Gastroenterology

## 2024-08-31 ENCOUNTER — Other Ambulatory Visit (HOSPITAL_BASED_OUTPATIENT_CLINIC_OR_DEPARTMENT_OTHER): Payer: Self-pay

## 2024-09-01 ENCOUNTER — Other Ambulatory Visit: Payer: Self-pay

## 2024-09-01 ENCOUNTER — Other Ambulatory Visit (HOSPITAL_BASED_OUTPATIENT_CLINIC_OR_DEPARTMENT_OTHER): Payer: Self-pay

## 2024-09-05 ENCOUNTER — Other Ambulatory Visit (HOSPITAL_BASED_OUTPATIENT_CLINIC_OR_DEPARTMENT_OTHER): Payer: Self-pay

## 2024-09-05 ENCOUNTER — Other Ambulatory Visit: Payer: Self-pay

## 2024-09-21 ENCOUNTER — Other Ambulatory Visit (HOSPITAL_BASED_OUTPATIENT_CLINIC_OR_DEPARTMENT_OTHER): Payer: Self-pay

## 2024-09-30 ENCOUNTER — Other Ambulatory Visit (HOSPITAL_BASED_OUTPATIENT_CLINIC_OR_DEPARTMENT_OTHER): Payer: Self-pay

## 2024-10-06 ENCOUNTER — Other Ambulatory Visit (HOSPITAL_BASED_OUTPATIENT_CLINIC_OR_DEPARTMENT_OTHER): Payer: Self-pay

## 2024-10-06 ENCOUNTER — Other Ambulatory Visit: Payer: Self-pay

## 2024-10-11 ENCOUNTER — Other Ambulatory Visit (HOSPITAL_BASED_OUTPATIENT_CLINIC_OR_DEPARTMENT_OTHER): Payer: Self-pay

## 2024-10-11 ENCOUNTER — Encounter: Payer: Self-pay | Admitting: Family

## 2024-10-12 DIAGNOSIS — H401132 Primary open-angle glaucoma, bilateral, moderate stage: Secondary | ICD-10-CM | POA: Diagnosis not present

## 2024-10-12 DIAGNOSIS — Z961 Presence of intraocular lens: Secondary | ICD-10-CM | POA: Diagnosis not present

## 2024-10-12 DIAGNOSIS — E119 Type 2 diabetes mellitus without complications: Secondary | ICD-10-CM | POA: Diagnosis not present

## 2024-10-12 DIAGNOSIS — H18593 Other hereditary corneal dystrophies, bilateral: Secondary | ICD-10-CM | POA: Diagnosis not present

## 2024-10-14 ENCOUNTER — Other Ambulatory Visit (HOSPITAL_BASED_OUTPATIENT_CLINIC_OR_DEPARTMENT_OTHER): Payer: Self-pay

## 2024-10-14 MED ORDER — FLUZONE HIGH-DOSE 0.5 ML IM SUSY
0.5000 mL | PREFILLED_SYRINGE | Freq: Once | INTRAMUSCULAR | 0 refills | Status: AC
  Filled 2024-10-14: qty 0.5, 1d supply, fill #0

## 2024-10-24 ENCOUNTER — Encounter: Payer: Self-pay | Admitting: Gastroenterology

## 2024-10-24 ENCOUNTER — Other Ambulatory Visit (HOSPITAL_BASED_OUTPATIENT_CLINIC_OR_DEPARTMENT_OTHER): Payer: Self-pay

## 2024-10-24 ENCOUNTER — Ambulatory Visit: Admitting: Gastroenterology

## 2024-10-24 ENCOUNTER — Other Ambulatory Visit: Payer: Self-pay

## 2024-10-24 VITALS — BP 122/70 | HR 80 | Ht 68.0 in | Wt 180.0 lb

## 2024-10-24 DIAGNOSIS — R935 Abnormal findings on diagnostic imaging of other abdominal regions, including retroperitoneum: Secondary | ICD-10-CM

## 2024-10-24 DIAGNOSIS — Z860101 Personal history of adenomatous and serrated colon polyps: Secondary | ICD-10-CM | POA: Diagnosis not present

## 2024-10-24 DIAGNOSIS — K869 Disease of pancreas, unspecified: Secondary | ICD-10-CM | POA: Diagnosis not present

## 2024-10-24 DIAGNOSIS — K8689 Other specified diseases of pancreas: Secondary | ICD-10-CM

## 2024-10-24 MED ORDER — INSULIN PEN NEEDLE 31G X 5 MM MISC
1 refills | Status: AC
  Filled 2024-10-24: qty 100, 90d supply, fill #0

## 2024-10-24 NOTE — Patient Instructions (Signed)
 You have been scheduled for an MRCP at [facility] on Thursday, 11/03/24. Your appointment time is 8:00 am. Please arrive to admitting (at main entrance of the hospital) 30 minutes prior to your appointment time for registration purposes. Please make certain not to have anything to eat or drink 6 hours prior to your test. In addition, if you have any metal in your body, have a pacemaker or defibrillator, please be sure to let your ordering physician know. This test typically takes 45 minutes to 1 hour to complete. Should you need to reschedule, please call [phone] to do so.  Thank you for trusting me with your gastrointestinal care!   Camie Furbish, PA-C  _______________________________________________________  If your blood pressure at your visit was 140/90 or greater, please contact your primary care physician to follow up on this.  _______________________________________________________  If you are age 75 or older, your body mass index should be between 23-30. Your Body mass index is 27.37 kg/m. If this is out of the aforementioned range listed, please consider follow up with your Primary Care Provider.  If you are age 26 or younger, your body mass index should be between 19-25. Your Body mass index is 27.37 kg/m. If this is out of the aformentioned range listed, please consider follow up with your Primary Care Provider.   ________________________________________________________  The [facility] providers would like to encourage you to use MYCHART to communicate with providers for non-urgent requests or questions.  Due to long hold times on the telephone, sending your provider a message by [facility] may be a faster and more efficient way to get a response.  Please allow 48 business hours for a response.  Please remember that this is for non-urgent requests.  _______________________________________________________  [facility] is using a team-based approach to care.   Your team is made up of your doctor and two to three APPS. Our APPS (Nurse Practitioners and Physician Assistants) work with your physician to ensure care continuity for you. They are fully qualified to address your health concerns and develop a treatment plan. They communicate directly with your gastroenterologist to care for you. Seeing the Advanced Practice Practitioners on your physician's team can help you by facilitating care more promptly, often allowing for earlier appointments, access to diagnostic testing, procedures, and other specialty referrals.

## 2024-10-24 NOTE — Progress Notes (Signed)
 Adam Quinn [id number] Oct 31, 1956   Chief Complaint: Schedule CT/MRI  Referring Provider: Vernon Velna SAUNDERS, MD Primary GI MD: Dr. Shila  HPI: Adam Quinn is a 68 y.o. male with past medical history of anxiety, CKD, diabetes, diverticulosis with prior diverticulitis, GERD, colon polyps, hiatal hernia, IDA with previous iron infusions, OSA on CPAP, stroke who presents today for follow up.  Patient last seen in office 12/18/2022 by Vina Dasen, NP.  Noted to have chronic iron deficiency anemia with previous unrevealing GI workup and followed by hematology.  Had acute on chronic anemia in setting of recurrent GI bleeding with maroon stools on Plavix  earlier that month, source of bleeding elusive as before but possibly diverticular.  CT was negative for active bleeding.  Received IV iron. Noted to have a subcentimeter but progressively calcifying lesion of the pancreatic head/uncinate on CT scan.  Unclear significance.  Noted to need a pancreas protocol CT abdomen versus MRI/MRCP in 1 year to evaluate the lesion, recall placed.  CT pancreas abdomen with and without contrast 12/29/2023 showed a fluid density lesion medially in the pancreatic head with a small amount of calcification along its inferolateral border.  Size qualified for formal interval growth.  Reasonable options included EUS with FNA or 38-month follow-up pancreatic protocol MRI or CT scan.  Dr. Shila recommended MRCP in 6 months.  Patient called to schedule MRCP in August.  CT abdomen with contrast has been ordered.  Neither imaging study has been completed.  Labs 08/17/2024: Normal CBC, normal iron panel, elevated ferritin in setting of iron infusions, normal reticulocyte count.   Discussed the use of AI scribe software for clinical note transcription with the patient, who gave verbal consent to proceed.  History of Present Illness Adam Quinn is a 68 year old male who presents for follow-up regarding a  lesion noted on a prior CT scan.  Abdominal lesion - CT scan in February revealed a lesion with slight interval increase in size - MRI recommended for further evaluation  Anemia - History of anemia with no iron infusion required since July - Recent laboratory results show stable hemoglobin and iron levels - Currently not anemic - Ongoing hematology follow-up with upcoming blood work (CBC and iron saturation) scheduled for December 24  Weight loss and appetite changes - Weight decreased from 185 pounds to 180 pounds, attributed to decreased appetite following COVID-19 infection in early August - Persistent taste changes since COVID-19 infection in 2021, during which he lost 30 pounds  Gastrointestinal symptoms - No abdominal pain, nausea, vomiting, or changes in bowel movements - History of diverticulosis without recent complications  Jaundice and hepatobiliary symptoms - No yellowing of skin or eyes - No history of pancreatitis or gallbladder disease    Previous GI Procedures/Imaging   CT pancreas abdomen with and without contrast 12/29/2023 IMPRESSION: 1. A 1.4 by 1.0 by 1.6 cm fluid density lesion is observed medially in the pancreatic head, with a small amount of calcification along its inferolateral border. I measure this at 0.9 by 0.7 by 1.0 cm on 11/27/2022, and this qualifies as formal interval growth. No dorsal pancreatic duct dilatation is observed. Appearance favors intraductal papillary mucinous neoplasm or a postinflammatory cystic lesion. Current reasonable options include endoscopic ultrasound with fine-needle aspiration, or six-month follow up pancreatic protocol MRI or CT scan. This recommendation follows ACR consensus guidelines: Management of Incidental Pancreatic Cysts: A White Paper of the ACR Incidental Findings Committee. J Am Coll Radiol 2017;14:911-923. 2. Diverticulosis  of the visualized portion of the colon. 3. Calcific nodularity along the margin of  the descending colon and along part of the sigmoid colon probably represents chronic contrast medium in diverticula, alternatively could represent chronic nonprogressive calcified omental nodules. 4. Left anterior descending circumflex coronary atherosclerosis. Descending thoracic aortic atherosclerosis. Mild cardiomegaly.  Capsule endoscopy 04/04/2022 Complete capsule endoscopy with no findings to explain iron deficiency anemia Recent admission with hematochezia very likely diverticular in etiology  Colonoscopy 01/30/2022 - Stool in the entire examined colon.  - One 12 mm polyp in the ascending colon, removed with a cold snare. Resected and retrieved.  - One 4 mm polyp in the rectum, removed with a cold snare. Resected and retrieved.  - Severe diverticulosis in the sigmoid colon, in the descending colon, in the transverse colon, in the ascending colon and in the cecum. There was evidence of diverticular spasm. Peri- diverticular erythema was seen. There was evidence of an impacted diverticulum.  - Non- bleeding external and internal hemorrhoids. - Recall 3 years with extended prep Path: A. COLON, ASCENDING, POLYPECTOMY:  Tubular adenoma  Negative for high-grade dysplasia and carcinoma   B. RECTUM, POLYPECTOMY:  Hyperplastic polyp  Negative for dysplasia and carcinoma   EGD 08/02/2021 - Z- line regular, 38 cm from the incisors.  - No gross lesions in esophagus.  - Normal stomach.  - Normal examined duodenum.  - No specimens collected.  Past Medical History:  Diagnosis Date   Allergy    seasonal   Anxiety    Cataract    cataract surgery bilaterally resolved issues   Central scotoma    Chronic kidney disease    small protien showing uses Lisinopril for this   Diverticulitis    Diverticulosis    DM (diabetes mellitus) (HCC)    GERD (gastroesophageal reflux disease)    Glaucoma    History of colon polyps    History of hiatal hernia    History of kidney stones    x1   Iron  deficiency anemia    Iron infusion 04/29/2019   Neuropathy    OSA on CPAP    Stroke (HCC)    Wears glasses    READING    Past Surgical History:  Procedure Laterality Date   cataract surgery     bilateral   COLONOSCOPY     COLONOSCOPY N/A 06/04/2015   Procedure: COLONOSCOPY;  Surgeon: Lamar JONETTA Aho, MD;  Location: [facility];  Service: Endoscopy;  Laterality: N/A;   COLONOSCOPY WITH PROPOFOL  N/A 05/09/2019   Procedure: COLONOSCOPY WITH PROPOFOL ;  Surgeon: Legrand Victory LITTIE DOUGLAS, MD;  Location: [facility];  Service: Gastroenterology;  Laterality: N/A;   COLONOSCOPY WITH PROPOFOL  N/A 01/30/2022   Procedure: COLONOSCOPY WITH PROPOFOL ;  Surgeon: Shila Gustav GAILS, MD;  Location: [facility];  Service: Endoscopy;  Laterality: N/A;   ESOPHAGOGASTRODUODENOSCOPY N/A 06/04/2015   Procedure: ESOPHAGOGASTRODUODENOSCOPY (EGD);  Surgeon: Lamar JONETTA Aho, MD;  Location: [facility];  Service: Endoscopy;  Laterality: N/A;   ESOPHAGOGASTRODUODENOSCOPY (EGD) WITH PROPOFOL  N/A 08/02/2021   Procedure: ESOPHAGOGASTRODUODENOSCOPY (EGD) WITH PROPOFOL ;  Surgeon: Shila Gustav GAILS, MD;  Location: [facility];  Service: Endoscopy;  Laterality: N/A;   POLYPECTOMY  01/30/2022   Procedure: POLYPECTOMY;  Surgeon: Shila Gustav GAILS, MD;  Location: [facility];  Service: Endoscopy;;   UPPER GASTROINTESTINAL ENDOSCOPY     WISDOM TOOTH EXTRACTION     with sedation    Current Outpatient Medications  Medication Sig Dispense Refill   acetaminophen  (TYLENOL ) 650 MG CR tablet  Take 650 mg by mouth every 8 (eight) hours as needed for pain.     bimatoprost  (LUMIGAN ) 0.01 % SOLN Place 1 drop into both eyes at bedtime. 2.5 mL 3   clopidogrel  (PLAVIX ) 75 MG tablet Take 1 tablet (75 mg total) by mouth daily. 90 tablet 3   Continuous Glucose Sensor (FREESTYLE LIBRE 3 PLUS SENSOR) MISC Apply to arm as directed to read blood sugar continuously, and change every 15 days. 2 each 3   dorzolamide  (TRUSOPT ) 2 % ophthalmic solution  Place 1 drop into both eyes 2 (two) times daily. 30 mL 3   Dulaglutide  (TRULICITY ) 1.5 MG/0.5ML SOAJ Inject 1.5 mg into the skin once a week. 6 mL 5   ELDERBERRY PO Take 1 tablet by mouth at bedtime. Gummy     FIBER PO Take 1 tablet by mouth daily. Miralax  brand fiber gummy.     gabapentin  (NEURONTIN ) 300 MG capsule Take 1 capsule (300 mg total) by mouth daily. 30 capsule 3   insulin  degludec (TRESIBA  FLEXTOUCH) 200 UNIT/ML FlexTouch Pen Inject 30 Units into the skin daily. 9 mL 5   Insulin  Pen Needle (PEN NEEDLES) 31G X 5 MM MISC Use 1 needle to inject Tresiba  under the skin once daily as directed. 100 each 1   Insulin  Pen Needle 31G X 5 MM MISC Use as directed with tresiba . 100 each 1   lansoprazole  (PREVACID ) 30 MG capsule Take 1 capsule (30 mg total) by mouth 2 (two) times daily. 180 capsule 3   loratadine -pseudoephedrine (ALAVERT  D-12 HOUR ALLERGY/CONG) 5-120 MG tablet Take 1 tablet by mouth at bedtime.     losartan  (COZAAR ) 25 MG tablet Take 1 tablet (25 mg total) by mouth daily for kidney protection. 90 tablet 3   Meclizine  HCl 25 MG CHEW 1 tablet as needed Orally every 12 hrs (Patient taking differently: As needed for vertigo) 30 tablet 1   Melatonin 5 MG CHEW Chew 5 mg by mouth at bedtime.     metFORMIN  (GLUCOPHAGE -XR) 500 MG 24 hr tablet Take 2 tablets (1,000 mg total) by mouth 2 (two) times daily. 360 tablet 3   Probiotic Product (RA PROBIOTIC GUMMIES PO) Take 1 capsule by mouth at bedtime.     sertraline  (ZOLOFT ) 100 MG tablet Take 1 tablet (100 mg total) by mouth daily. 90 tablet 2   simvastatin  (ZOCOR ) 20 MG tablet Take 1 tablet (20 mg total) by mouth every evening. 90 tablet 3   sodium bicarbonate  650 MG tablet Take 1 tablet (650 mg total) by mouth 2 (two) times daily. 180 tablet 2   tamsulosin  (FLOMAX ) 0.4 MG CAPS capsule Take 1 capsule (0.4 mg total) by mouth at bedtime. 90 capsule 3   No current facility-administered medications for this visit.    Allergies as of 10/24/2024 -  Review Complete 10/24/2024  Allergen Reaction Noted   Aspirin Anaphylaxis and Hives 03/16/2008   Shrimp [shellfish allergy] Hives 05/21/2015    Family History  Problem Relation Age of Onset   Colon polyps Father    Lung cancer Father    Diabetes Mother    Dementia Mother    Diabetes Sister        x 2   Stroke Maternal Grandmother    Stroke Paternal Grandfather    Colon cancer Neg Hx    Rectal cancer Neg Hx    Stomach cancer Neg Hx    Pancreatic cancer Neg Hx     Social History   Tobacco Use   Smoking  status: Former    Current packs/day: 0.00    Types: Cigarettes    Quit date: 10/04/1997    Years since quitting: 27.0   Smokeless tobacco: Never  Vaping Use   Vaping status: Never Used  Substance Use Topics   Alcohol use: No   Drug use: No     Review of Systems:    Constitutional: No unexplained weight loss, fever, chills Gastrointestinal: See HPI and otherwise negative   Physical Exam:  Vital signs: BP 122/70   Pulse 80   Ht 5' 8 (1.727 m)   Wt 180 lb (81.6 kg)   BMI 27.37 kg/m   Constitutional: Pleasant, overweight male in NAD, alert and cooperative Head:  Normocephalic and atraumatic.  Eyes: No scleral icterus.  Respiratory: Respirations even and unlabored. Lungs clear to auscultation bilaterally.  No wheezes, crackles, or rhonchi.  Cardiovascular:  Regular rate and rhythm. No murmurs. No peripheral edema. Gastrointestinal:  Soft, nondistended, nontender. No rebound or guarding. Normal bowel sounds. No appreciable masses or hepatomegaly. Rectal:  Not performed.  Neurologic:  Alert and oriented x4;  grossly normal neurologically.  Skin:   Dry and intact without significant lesions or rashes. Psychiatric: Oriented to person, place and time. Demonstrates good judgement and reason without abnormal affect or behaviors.   RELEVANT LABS AND IMAGING: CBC    Component Value Date/Time   WBC 8.2 08/17/2024 1320   WBC 7.1 02/16/2024 1253   RBC 4.36 08/17/2024  1320   RBC 4.35 08/17/2024 1320   HGB 14.1 08/17/2024 1320   HGB 15.9 09/07/2017 0906   HCT 40.6 08/17/2024 1320   HCT 47.4 09/07/2017 0906   PLT 217 08/17/2024 1320   PLT 225 09/07/2017 0906   MCV 93.1 08/17/2024 1320   MCV 86 09/07/2017 0906   MCH 32.3 08/17/2024 1320   MCHC 34.7 08/17/2024 1320   RDW 13.4 08/17/2024 1320   RDW 17.3 (H) 09/07/2017 0906   LYMPHSABS 1.3 08/17/2024 1320   LYMPHSABS 1.1 09/07/2017 0906   MONOABS 0.7 08/17/2024 1320   EOSABS 0.1 08/17/2024 1320   EOSABS 0.1 09/07/2017 0906   BASOSABS 0.1 08/17/2024 1320   BASOSABS 0.0 09/07/2017 0906    CMP     Component Value Date/Time   NA 140 07/17/2023 1136   NA 140 05/26/2017 0856   K 5.0 07/17/2023 1136   K 4.2 05/26/2017 0856   CL 104 07/17/2023 1136   CO2 26 07/17/2023 1136   CO2 25 05/26/2017 0856   GLUCOSE 198 (H) 07/17/2023 1136   GLUCOSE 175 (H) 05/26/2017 0856   BUN 18 07/17/2023 1136   BUN 12.7 05/26/2017 0856   CREATININE 1.20 12/29/2023 1243   CREATININE 1.32 (H) 06/13/2022 1027   CREATININE 1.2 05/26/2017 0856   CALCIUM 9.6 07/17/2023 1136   CALCIUM 9.5 05/26/2017 0856   PROT 6.5 12/02/2022 0938   PROT 6.9 05/26/2017 0856   ALBUMIN 3.9 12/02/2022 0938   ALBUMIN 3.7 05/26/2017 0856   AST 11 12/02/2022 0938   AST 13 (L) 06/13/2022 1027   AST 16 05/26/2017 0856   ALT 15 12/02/2022 0938   ALT 15 06/13/2022 1027   ALT 22 05/26/2017 0856   ALKPHOS 84 12/02/2022 0938   ALKPHOS 83 05/26/2017 0856   BILITOT 0.3 12/02/2022 0938   BILITOT 0.2 (L) 06/13/2022 1027   BILITOT 0.28 05/26/2017 0856   GFRNONAA 60 (L) 11/28/2022 0528   GFRNONAA 59 (L) 06/13/2022 1027   GFRAA >60 04/24/2020 1003   Echocardiogram 11/27/2022 1.  Left ventricular ejection fraction, by estimation, is 65 to 70% . The left ventricle has normal function. The left ventricle has no regional wall motion abnormalities. There is mild left ventricular hypertrophy. Left ventricular diastolic parameters are consistent with Grade  I diastolic dysfunction ( impaired relaxation) .  2. Right ventricular systolic function is normal. The right ventricular size is normal. Tricuspid regurgitation signal is inadequate for assessing PA pressure.  3. The mitral valve is normal in structure. No evidence of mitral valve regurgitation. No evidence of mitral stenosis.  4. The aortic valve is tricuspid. Aortic valve regurgitation is not visualized. No aortic stenosis is present.  5. Aortic dilatation noted. There is mild dilatation of the aortic root, measuring 40 mm.  6. The inferior vena cava is normal in size with greater than 50% respiratory variability, suggesting right atrial pressure of 3 mmHg.  Assessment/Plan:   Assessment & Plan Pancreatic lesion under surveillance Lesion slightly increased in size on February CT, repeat imaging in 6 months (MRCP per Dr. Shila) was recommended.   - Order MRCP - Follow up with Dr. Shila after imaging study  History of iron deficiency anemia Anemia well-managed, no current anemia.  - Continue hematology follow-up for repeat iron studies.  History of colonic polyps Previous colonoscopy with inadequate preparation, 3 year recall with extended prep recommended, due 01/2025.  - Recall is in for repeat colonoscopy   Camie Furbish, PA-C [facility] 10/24/2024, 9:43 AM  Patient Care Team: Vernon Velna SAUNDERS, MD as PCP - General (Internal Medicine)

## 2024-10-27 ENCOUNTER — Other Ambulatory Visit: Payer: Self-pay

## 2024-10-27 ENCOUNTER — Other Ambulatory Visit (HOSPITAL_BASED_OUTPATIENT_CLINIC_OR_DEPARTMENT_OTHER): Payer: Self-pay

## 2024-11-03 ENCOUNTER — Ambulatory Visit (HOSPITAL_COMMUNITY): Admission: RE | Admit: 2024-11-03 | Discharge: 2024-11-03 | Attending: Gastroenterology

## 2024-11-03 ENCOUNTER — Other Ambulatory Visit: Payer: Self-pay | Admitting: Gastroenterology

## 2024-11-03 DIAGNOSIS — Z860101 Personal history of adenomatous and serrated colon polyps: Secondary | ICD-10-CM | POA: Diagnosis present

## 2024-11-03 DIAGNOSIS — R935 Abnormal findings on diagnostic imaging of other abdominal regions, including retroperitoneum: Secondary | ICD-10-CM | POA: Diagnosis present

## 2024-11-03 DIAGNOSIS — K8689 Other specified diseases of pancreas: Secondary | ICD-10-CM

## 2024-11-03 DIAGNOSIS — K869 Disease of pancreas, unspecified: Secondary | ICD-10-CM

## 2024-11-03 MED ORDER — GADOBUTROL 1 MMOL/ML IV SOLN
8.0000 mL | Freq: Once | INTRAVENOUS | Status: AC | PRN
  Administered 2024-11-03: 8 mL via INTRAVENOUS

## 2024-11-07 ENCOUNTER — Ambulatory Visit: Payer: Self-pay | Admitting: Gastroenterology

## 2024-11-09 ENCOUNTER — Other Ambulatory Visit (HOSPITAL_BASED_OUTPATIENT_CLINIC_OR_DEPARTMENT_OTHER): Payer: Self-pay

## 2024-11-10 NOTE — Progress Notes (Addendum)
 Adam Quinn                                          MRN: [id number]   12/02/2024   The [facility] Quality Team Specialist reviewed this patient medical record for the purposes of chart review for care gap closure. The following were reviewed: chart review for care gap closure-kidney health evaluation for diabetes:eGFR  and uACR.    [facility] Quality Team

## 2024-11-14 ENCOUNTER — Ambulatory Visit: Admitting: Podiatry

## 2024-11-14 ENCOUNTER — Encounter: Payer: Self-pay | Admitting: Podiatry

## 2024-11-14 ENCOUNTER — Telehealth: Payer: Self-pay | Admitting: Gastroenterology

## 2024-11-14 DIAGNOSIS — E1142 Type 2 diabetes mellitus with diabetic polyneuropathy: Secondary | ICD-10-CM

## 2024-11-14 DIAGNOSIS — D689 Coagulation defect, unspecified: Secondary | ICD-10-CM

## 2024-11-14 DIAGNOSIS — M79675 Pain in left toe(s): Secondary | ICD-10-CM

## 2024-11-14 DIAGNOSIS — B351 Tinea unguium: Secondary | ICD-10-CM | POA: Diagnosis not present

## 2024-11-14 DIAGNOSIS — M79674 Pain in right toe(s): Secondary | ICD-10-CM

## 2024-11-14 NOTE — Progress Notes (Signed)
 This patient returns to my office for at risk foot care.  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  This patient is unable to cut nails himself since the patient cannot reach his nails.These nails are painful walking and wearing shoes.  This patient presents for at risk foot care today.  General Appearance  Alert, conversant and in no acute stress.  Vascular  Dorsalis pedis and posterior tibial  pulses are palpable  bilaterally.  Capillary return is within normal limits  bilaterally. Temperature is within normal limits  bilaterally.  Neurologic  Senn-Weinstein monofilament wire test absent  bilaterally. Muscle power within normal limits bilaterally.  Nails Thick disfigured discolored nails with subungual debris  Hallux nails  B/L. No evidence of bacterial infection or drainage bilaterally.  Orthopedic  No limitations of motion  feet .  No crepitus or effusions noted.  Hammer toes 2-4  B/L. Hallux malleus  B/L.  Cavus foot  B/L.  Skin  normotropic skin with no porokeratosis noted bilaterally.  No signs of infections or ulcers noted.     Onychomycosis  Pain in right toes  Pain in left toes  Consent was obtained for treatment procedures.   Mechanical debridement of hallux nails  bilaterally performed with a nail nipper.  Filed with dremel without incident.    Return office visit   3  months                  Told patient to return for periodic foot care and evaluation due to potential at risk complications.   Cordella Bold DPM

## 2024-11-14 NOTE — Telephone Encounter (Signed)
 Left message on machine to call back

## 2024-11-14 NOTE — Telephone Encounter (Signed)
 Please let patient know I have reviewed the results of his MRCP.  I have also discussed findings with Dr. Shila.   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  Recommendation is for follow-up MRCP in 1 year.  Please place recall.  He also has some findings of the liver and spleen which seem to reflect iron deposition.  This is likely caused by his recent iron infusions and no further workup for this is needed at this time.  I did look back at his most recent iron/ferritin studies and ferritin has been high.  Not sure if there is any plan for repeat infusions but looks like he has a visit with oncology this week and recommend he discuss with them.

## 2024-11-15 NOTE — Telephone Encounter (Signed)
 See My Chart message- he does view his messages

## 2024-11-16 ENCOUNTER — Inpatient Hospital Stay: Attending: Family

## 2024-11-16 ENCOUNTER — Other Ambulatory Visit: Payer: Self-pay

## 2024-11-16 ENCOUNTER — Encounter: Payer: Self-pay | Admitting: Family

## 2024-11-16 ENCOUNTER — Inpatient Hospital Stay: Admitting: Family

## 2024-11-16 ENCOUNTER — Other Ambulatory Visit (HOSPITAL_BASED_OUTPATIENT_CLINIC_OR_DEPARTMENT_OTHER): Payer: Self-pay

## 2024-11-16 VITALS — BP 142/71 | HR 85 | Temp 98.6°F | Resp 18 | Ht 68.0 in | Wt 176.0 lb

## 2024-11-16 DIAGNOSIS — K922 Gastrointestinal hemorrhage, unspecified: Secondary | ICD-10-CM | POA: Insufficient documentation

## 2024-11-16 DIAGNOSIS — K909 Intestinal malabsorption, unspecified: Secondary | ICD-10-CM | POA: Diagnosis present

## 2024-11-16 DIAGNOSIS — D500 Iron deficiency anemia secondary to blood loss (chronic): Secondary | ICD-10-CM | POA: Diagnosis not present

## 2024-11-16 LAB — CBC WITH DIFFERENTIAL (CANCER CENTER ONLY)
Abs Immature Granulocytes: 0.09 K/uL — ABNORMAL HIGH (ref 0.00–0.07)
Basophils Absolute: 0.1 K/uL (ref 0.0–0.1)
Basophils Relative: 1 %
Eosinophils Absolute: 0.1 K/uL (ref 0.0–0.5)
Eosinophils Relative: 1 %
HCT: 41.7 % (ref 39.0–52.0)
Hemoglobin: 14 g/dL (ref 13.0–17.0)
Immature Granulocytes: 1 %
Lymphocytes Relative: 15 %
Lymphs Abs: 1 K/uL (ref 0.7–4.0)
MCH: 31.9 pg (ref 26.0–34.0)
MCHC: 33.6 g/dL (ref 30.0–36.0)
MCV: 95 fL (ref 80.0–100.0)
Monocytes Absolute: 0.6 K/uL (ref 0.1–1.0)
Monocytes Relative: 9 %
Neutro Abs: 4.8 K/uL (ref 1.7–7.7)
Neutrophils Relative %: 73 %
Platelet Count: 236 K/uL (ref 150–400)
RBC: 4.39 MIL/uL (ref 4.22–5.81)
RDW: 12.8 % (ref 11.5–15.5)
WBC Count: 6.6 K/uL (ref 4.0–10.5)
nRBC: 0 % (ref 0.0–0.2)

## 2024-11-16 LAB — RETICULOCYTES
Immature Retic Fract: 7.5 % (ref 2.3–15.9)
RBC.: 4.33 MIL/uL (ref 4.22–5.81)
Retic Count, Absolute: 47.6 K/uL (ref 19.0–186.0)
Retic Ct Pct: 1.1 % (ref 0.4–3.1)

## 2024-11-16 LAB — FERRITIN: Ferritin: 863 ng/mL — ABNORMAL HIGH (ref 24–336)

## 2024-11-16 LAB — IRON AND IRON BINDING CAPACITY (CC-WL,HP ONLY)
Iron: 91 ug/dL (ref 45–182)
Saturation Ratios: 31 % (ref 17.9–39.5)
TIBC: 294 ug/dL (ref 250–450)
UIBC: 203 ug/dL

## 2024-11-16 MED ORDER — AREXVY 120 MCG/0.5ML IM SUSR
0.5000 mL | Freq: Once | INTRAMUSCULAR | 0 refills | Status: AC
  Filled 2024-11-16: qty 0.5, 1d supply, fill #0

## 2024-11-16 NOTE — Progress Notes (Signed)
 " Hematology and Oncology Follow Up Visit  Adam Quinn [id number] May 06, 1956 68 y.o. 11/16/2024   Principle Diagnosis:  Iron deficiency anemia Erythropoietin  deficiency anemia   Current Therapy:        IV iron as indicated   Interim History:  Adam Quinn is here today for follow-up. He is doing well and has no complaints at this time.  He will be having his colonoscopy in March 2026.  No obvious blood loss noted.  No fever, chills, n/v, cough, rash, dizziness, SOB, chest pain, palpitations, abdominal pain or changes in bowel or bladder habits at this time.  No swelling in his extremities.  Neuropathy in his lower extremities unchanged from baseline. He is ambulating with a cane at times for added support.  No falls or syncope reported.  Appetite and hydration are good. Weight is stable at 176 lbs.   ECOG Performance Status: 0 - Asymptomatic  Medications:  Allergies as of 11/16/2024       Reactions   Aspirin Anaphylaxis, Hives   Shrimp [shellfish Allergy] Hives   Just shrimp        Medication List        Accurate as of November 16, 2024 10:41 AM. If you have any questions, ask your nurse or doctor.          acetaminophen  650 MG CR tablet Commonly known as: TYLENOL  Take 650 mg by mouth every 8 (eight) hours as needed for pain.   Alavert  D-12 Hour Allergy/Cong 5-120 MG tablet Generic drug: loratadine -pseudoephedrine Take 1 tablet by mouth at bedtime.   clopidogrel  75 MG tablet Commonly known as: PLAVIX  Take 1 tablet (75 mg total) by mouth daily.   dorzolamide  2 % ophthalmic solution Commonly known as: TRUSOPT  Place 1 drop into both eyes 2 (two) times daily.   ELDERBERRY PO Take 1 tablet by mouth at bedtime. Gummy   FIBER PO Take 1 tablet by mouth daily. Miralax  brand fiber gummy.   FreeStyle Libre 3 Plus Sensor Misc Apply to arm as directed to read blood sugar continuously, and change every 15 days.   gabapentin  300 MG capsule Commonly known as:  NEURONTIN  Take 1 capsule (300 mg total) by mouth daily.   Insupen Pen Needles 31G X 5 MM Misc Generic drug: Insulin  Pen Needle Use 1 needle to inject Tresiba  under the skin once daily as directed.   TechLite Pen Needles 31G X 5 MM Misc Generic drug: Insulin  Pen Needle Use as directed with tresiba .   TechLite Pen Needles 31G X 5 MM Misc Generic drug: Insulin  Pen Needle Use as directed daily with Tresiba .   lansoprazole  30 MG capsule Commonly known as: PREVACID  Take 1 capsule (30 mg total) by mouth 2 (two) times daily.   losartan  25 MG tablet Commonly known as: COZAAR  Take 1 tablet (25 mg total) by mouth daily for kidney protection.   Lumigan  0.01 % Soln Generic drug: bimatoprost  Place 1 drop into both eyes at bedtime.   Meclizine  HCl 25 MG Chew 1 tablet as needed Orally every 12 hrs What changed: additional instructions   Melatonin 5 MG Chew Chew 5 mg by mouth at bedtime.   metFORMIN  500 MG 24 hr tablet Commonly known as: GLUCOPHAGE -XR Take 2 tablets (1,000 mg total) by mouth 2 (two) times daily.   RA PROBIOTIC GUMMIES PO Take 1 capsule by mouth at bedtime.   sertraline  100 MG tablet Commonly known as: ZOLOFT  Take 1 tablet (100 mg total) by mouth daily.   simvastatin   20 MG tablet Commonly known as: ZOCOR  Take 1 tablet (20 mg total) by mouth every evening.   sodium bicarbonate  650 MG tablet Take 1 tablet (650 mg total) by mouth 2 (two) times daily.   tamsulosin  0.4 MG Caps capsule Commonly known as: FLOMAX  Take 1 capsule (0.4 mg total) by mouth at bedtime.   Tresiba  FlexTouch 200 UNIT/ML FlexTouch Pen Generic drug: insulin  degludec Inject 30 Units into the skin daily.   Trulicity  1.5 MG/0.5ML Soaj Generic drug: Dulaglutide  Inject 1.5 mg into the skin once a week.        Allergies: Allergies[1]  Past Medical History, Surgical history, Social history, and Family History were reviewed and updated.  Review of Systems: All other 10 point review of  systems is negative.   Physical Exam:  vitals were not taken for this visit.   Wt Readings from Last 3 Encounters:  10/24/24 180 lb (81.6 kg)  08/17/24 182 lb 6.4 oz (82.7 kg)  02/16/24 186 lb 1.9 oz (84.4 kg)    Ocular: Sclerae unicteric, pupils equal, round and reactive to light Ear-nose-throat: Oropharynx clear, dentition fair Lymphatic: No cervical or supraclavicular adenopathy Lungs no rales or rhonchi, good excursion bilaterally Heart regular rate and rhythm, no murmur appreciated Abd soft, nontender, positive bowel sounds MSK no focal spinal tenderness, no joint edema Neuro: non-focal, well-oriented, appropriate affect Breasts: Deferred   Lab Results  Component Value Date   WBC 8.2 08/17/2024   HGB 14.1 08/17/2024   HCT 40.6 08/17/2024   MCV 93.1 08/17/2024   PLT 217 08/17/2024   Lab Results  Component Value Date   FERRITIN 945 (H) 08/17/2024   IRON 68 08/17/2024   TIBC 309 08/17/2024   UIBC 241 08/17/2024   IRONPCTSAT 22 08/17/2024   Lab Results  Component Value Date   RETICCTPCT 1.5 08/17/2024   RBC 4.36 08/17/2024   RBC 4.35 08/17/2024   No results found for: KPAFRELGTCHN, LAMBDASER, KAPLAMBRATIO No results found for: IGGSERUM, IGA, IGMSERUM No results found for: STEPHANY CARLOTA BENSON MARKEL EARLA JOANNIE DOC VICK, SPEI   Chemistry      Component Value Date/Time   NA 140 07/17/2023 1136   NA 140 05/26/2017 0856   K 5.0 07/17/2023 1136   K 4.2 05/26/2017 0856   CL 104 07/17/2023 1136   CO2 26 07/17/2023 1136   CO2 25 05/26/2017 0856   BUN 18 07/17/2023 1136   BUN 12.7 05/26/2017 0856   CREATININE 1.20 12/29/2023 1243   CREATININE 1.32 (H) 06/13/2022 1027   CREATININE 1.2 05/26/2017 0856      Component Value Date/Time   CALCIUM 9.6 07/17/2023 1136   CALCIUM 9.5 05/26/2017 0856   ALKPHOS 84 12/02/2022 0938   ALKPHOS 83 05/26/2017 0856   AST 11 12/02/2022 0938   AST 13 (L) 06/13/2022 1027   AST 16  05/26/2017 0856   ALT 15 12/02/2022 0938   ALT 15 06/13/2022 1027   ALT 22 05/26/2017 0856   BILITOT 0.3 12/02/2022 0938   BILITOT 0.2 (L) 06/13/2022 1027   BILITOT 0.28 05/26/2017 0856       Impression and Plan: Adam Quinn is a very pleasant 68 yo caucasian gentleman with 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    Iron studies are pending. We will replace if needed.  Follow-up in 3 months.   Lauraine Pepper, NP 12/24/202510:41 AM     [1]  Allergies Allergen Reactions   Aspirin Anaphylaxis and Hives  Shrimp [Shellfish Allergy] Hives    Just shrimp    "

## 2024-12-01 ENCOUNTER — Telehealth: Payer: Self-pay

## 2024-12-01 ENCOUNTER — Encounter: Payer: Self-pay | Admitting: Gastroenterology

## 2024-12-01 ENCOUNTER — Other Ambulatory Visit (HOSPITAL_COMMUNITY): Payer: Self-pay

## 2024-12-01 ENCOUNTER — Ambulatory Visit: Payer: Self-pay | Admitting: Gastroenterology

## 2024-12-01 ENCOUNTER — Other Ambulatory Visit (HOSPITAL_BASED_OUTPATIENT_CLINIC_OR_DEPARTMENT_OTHER): Payer: Self-pay

## 2024-12-01 ENCOUNTER — Other Ambulatory Visit (INDEPENDENT_AMBULATORY_CARE_PROVIDER_SITE_OTHER)

## 2024-12-01 ENCOUNTER — Ambulatory Visit: Admitting: Gastroenterology

## 2024-12-01 ENCOUNTER — Other Ambulatory Visit: Payer: Self-pay

## 2024-12-01 VITALS — BP 122/78 | HR 96 | Ht 68.0 in | Wt 179.4 lb

## 2024-12-01 DIAGNOSIS — D500 Iron deficiency anemia secondary to blood loss (chronic): Secondary | ICD-10-CM

## 2024-12-01 DIAGNOSIS — D136 Benign neoplasm of pancreas: Secondary | ICD-10-CM

## 2024-12-01 DIAGNOSIS — Z7901 Long term (current) use of anticoagulants: Secondary | ICD-10-CM | POA: Diagnosis not present

## 2024-12-01 DIAGNOSIS — Z860101 Personal history of adenomatous and serrated colon polyps: Secondary | ICD-10-CM | POA: Diagnosis not present

## 2024-12-01 DIAGNOSIS — K869 Disease of pancreas, unspecified: Secondary | ICD-10-CM | POA: Diagnosis not present

## 2024-12-01 DIAGNOSIS — D509 Iron deficiency anemia, unspecified: Secondary | ICD-10-CM | POA: Diagnosis not present

## 2024-12-01 LAB — HEPATIC FUNCTION PANEL
ALT: 12 U/L (ref 3–53)
AST: 9 U/L (ref 5–37)
Albumin: 4.2 g/dL (ref 3.5–5.2)
Alkaline Phosphatase: 113 U/L (ref 39–117)
Bilirubin, Direct: 0 mg/dL — ABNORMAL LOW (ref 0.1–0.3)
Total Bilirubin: 0.3 mg/dL (ref 0.2–1.2)
Total Protein: 7 g/dL (ref 6.0–8.3)

## 2024-12-01 MED ORDER — NA SULFATE-K SULFATE-MG SULF 17.5-3.13-1.6 GM/177ML PO SOLN
1.0000 | Freq: Once | ORAL | 0 refills | Status: AC
  Filled 2024-12-01: qty 354, 1d supply, fill #0

## 2024-12-01 NOTE — Progress Notes (Unsigned)
 "  Adam Quinn [id number] 03-Dec-1955   Chief Complaint: Pancreatic lesion  Referring Provider: Vernon Velna SAUNDERS, MD Primary GI MD: Dr. Shila  HPI: Adam Quinn is a 69 y.o. male with past medical history of anxiety, CKD, diabetes, diverticulosis with prior diverticulitis, GERD, colon polyps, hiatal hernia, IDA with previous iron infusions, OSA on CPAP, stroke who presents today for follow up.  Seen in office 12/18/2022 by Vina Dasen, NP.  Noted to have chronic iron deficiency anemia with previous unrevealing GI workup and followed by hematology.  Had acute on chronic anemia in setting of recurrent GI bleeding with maroon stools on Plavix  earlier that month, source of bleeding elusive as before but possibly diverticular.  CT was negative for active bleeding.  Received IV iron. Noted to have a subcentimeter but progressively calcifying lesion of the pancreatic head/uncinate on CT scan.  Unclear significance.  Noted to need a pancreas protocol CT abdomen versus MRI/MRCP in 1 year to evaluate the lesion, recall placed.   CT pancreas abdomen with and without contrast 12/29/2023 showed a fluid density lesion medially in the pancreatic head with a small amount of calcification along its inferolateral border.  Size qualified for formal interval growth.  Reasonable options included EUS with FNA or 46-month follow-up pancreatic protocol MRI or CT scan.  Dr. Shila recommended MRCP in 6 months.   Labs 08/17/2024: Normal CBC, normal iron panel, elevated ferritin in setting of iron infusions, normal reticulocyte count.  Last seen in office 10/24/2024 for follow-up of pancreatic lesion which had slightly increased in size on CT February 2025.  MRCP was ordered. Patient with history of IDA, anemia well-managed and stable, advised to continue follow-up with hematology for repeat iron studies. Patient will be due for repeat colonoscopy 01/2025 with extended prep due to previous inadequate prep.  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  Findings discussed with Dr. Shila.  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  Labs 11/16/2024: Normal CBC, reticulocyte count, iron panel.  Elevated ferritin 863, decreased from 3 months prior.   Discussed the use of AI scribe software for clinical note transcription with the patient, who gave verbal consent to proceed.  History of Present Illness Adam Quinn is a 69 year old male with a benign pancreatic neoplasm who presents for follow-up.  Pancreatic lesion surveillance - Benign pancreatic neoplasm with stable findings on recent MRI. - No abdominal pain, chills, jaundice, or changes in bowel movements.  Hepatic and splenic iron deposition - MRI revealed iron deposition in the liver and spleen. - Received iron infusions at least once a year for the past two to three years, previously initiated for iron saturation as low as 6%. - Phlebotomy performed in the past when indicated. - Ferritin level has been high but is trending down. - No recent iron infusions required.   Previous GI Procedures/Imaging   MRCP 11/03/2024 IMPRESSION: 1. 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 No suspicious features. Follow-up examination is recommended in 1 year with MRI abdomen with MRCP protocol. 2. There is diffuse T2 hypointensity of the liver and spleen which exhibit loss of signal on in phase images, in comparison to the out of phase images, favoring  deposition of paramagnetic substances such as iron, melatonin, etc. Findings commonly seen with secondary hemochromatosis. 3. Multiple other nonacute observations, as described above.  CT pancreas  abdomen with and without contrast 12/29/2023 IMPRESSION: 1. A 1.4 by 1.0 by 1.6 cm fluid density lesion is observed medially in the pancreatic head, with a small amount of calcification along its inferolateral border. I measure this at 0.9 by 0.7 by 1.0 cm on 11/27/2022, and this qualifies as formal interval growth. No dorsal pancreatic duct dilatation is observed. Appearance favors intraductal papillary mucinous neoplasm or a postinflammatory cystic lesion. Current reasonable options include endoscopic ultrasound with fine-needle aspiration, or six-month follow up pancreatic protocol MRI or CT scan. This recommendation follows ACR consensus guidelines: Management of Incidental Pancreatic Cysts: A White Paper of the ACR Incidental Findings Committee. J Am Coll Radiol 2017;14:911-923. 2. Diverticulosis of the visualized portion of the colon. 3. Calcific nodularity along the margin of the descending colon and along part of the sigmoid colon probably represents chronic contrast medium in diverticula, alternatively could represent chronic nonprogressive calcified omental nodules. 4. Left anterior descending circumflex coronary atherosclerosis. Descending thoracic aortic atherosclerosis. Mild cardiomegaly.   Capsule endoscopy 04/04/2022 Complete capsule endoscopy with no findings to explain iron deficiency anemia Recent admission with hematochezia very likely diverticular in etiology   Colonoscopy 01/30/2022 - Stool in the entire examined colon.  - One 12 mm polyp in the ascending colon, removed with a cold snare. Resected and retrieved.  - One 4 mm polyp in the rectum, removed with a cold snare. Resected and retrieved.  - Severe diverticulosis in the sigmoid colon, in the descending colon, in the transverse colon, in the ascending colon and in the cecum. There was evidence of diverticular spasm. Peri- diverticular erythema was seen. There was evidence of an impacted diverticulum.  - Non-  bleeding external and internal hemorrhoids. - Recall 3 years with extended prep Path: A. COLON, ASCENDING, POLYPECTOMY:  Tubular adenoma  Negative for high-grade dysplasia and carcinoma   B. RECTUM, POLYPECTOMY:  Hyperplastic polyp  Negative for dysplasia and carcinoma    EGD 08/02/2021 - Z- line regular, 38 cm from the incisors.  - No gross lesions in esophagus.  - Normal stomach.  - Normal examined duodenum.  - No specimens collected.   Past Medical History:  Diagnosis Date   Allergy    seasonal   Anxiety    Cataract    cataract surgery bilaterally resolved issues   Central scotoma    Chronic kidney disease    small protien showing uses Lisinopril for this   Diverticulitis    Diverticulosis    DM (diabetes mellitus) (HCC)    GERD (gastroesophageal reflux disease)    Glaucoma    History of colon polyps    History of hiatal hernia    History of kidney stones    x1   Iron deficiency anemia    Iron infusion 04/29/2019   Neuropathy    OSA on CPAP    Stroke (HCC)    Wears glasses    READING    Past Surgical History:  Procedure Laterality Date   cataract surgery     bilateral   COLONOSCOPY     COLONOSCOPY N/A 06/04/2015   Procedure: COLONOSCOPY;  Surgeon: Lamar JONETTA Aho, MD;  Location: [facility];  Service: Endoscopy;  Laterality: N/A;   COLONOSCOPY WITH PROPOFOL  N/A 05/09/2019   Procedure: COLONOSCOPY WITH PROPOFOL ;  Surgeon: Legrand Victory LITTIE DOUGLAS, MD;  Location: [facility];  Service: Gastroenterology;  Laterality: N/A;   COLONOSCOPY WITH PROPOFOL  N/A 01/30/2022   Procedure: COLONOSCOPY WITH PROPOFOL ;  Surgeon: Shila Gustav GAILS, MD;  Location: [facility];  Service: Endoscopy;  Laterality: N/A;   ESOPHAGOGASTRODUODENOSCOPY N/A 06/04/2015   Procedure: ESOPHAGOGASTRODUODENOSCOPY (EGD);  Surgeon: Lamar JONETTA Aho, MD;  Location: [facility];  Service: Endoscopy;  Laterality: N/A;   ESOPHAGOGASTRODUODENOSCOPY (EGD) WITH PROPOFOL  N/A 08/02/2021   Procedure:  ESOPHAGOGASTRODUODENOSCOPY (EGD) WITH PROPOFOL ;  Surgeon: Shila Gustav GAILS, MD;  Location: [facility];  Service: Endoscopy;  Laterality: N/A;   POLYPECTOMY  01/30/2022   Procedure: POLYPECTOMY;  Surgeon: Shila Gustav GAILS, MD;  Location: [facility];  Service: Endoscopy;;   UPPER GASTROINTESTINAL ENDOSCOPY     WISDOM TOOTH EXTRACTION     with sedation    Current Outpatient Medications  Medication Sig Dispense Refill   acetaminophen  (TYLENOL ) 650 MG CR tablet Take 650 mg by mouth every 8 (eight) hours as needed for pain.     bimatoprost  (LUMIGAN ) 0.01 % SOLN Place 1 drop into both eyes at bedtime. 2.5 mL 3   clopidogrel  (PLAVIX ) 75 MG tablet Take 1 tablet (75 mg total) by mouth daily. 90 tablet 3   Continuous Glucose Sensor (FREESTYLE LIBRE 3 PLUS SENSOR) MISC Apply to arm as directed to read blood sugar continuously, and change every 15 days. 2 each 3   dorzolamide  (TRUSOPT ) 2 % ophthalmic solution Place 1 drop into both eyes 2 (two) times daily. 30 mL 3   Dulaglutide  (TRULICITY ) 1.5 MG/0.5ML SOAJ Inject 1.5 mg into the skin once a week. 6 mL 5   ELDERBERRY PO Take 1 tablet by mouth at bedtime. Gummy     FIBER PO Take 1 tablet by mouth daily. Miralax  brand fiber gummy.     gabapentin  (NEURONTIN ) 300 MG capsule Take 1 capsule (300 mg total) by mouth daily. 30 capsule 3   insulin  degludec (TRESIBA  FLEXTOUCH) 200 UNIT/ML FlexTouch Pen Inject 30 Units into the skin daily. 9 mL 5   Insulin  Pen Needle (PEN NEEDLES) 31G X 5 MM MISC Use 1 needle to inject Tresiba  under the skin once daily as directed. 100 each 1   Insulin  Pen Needle 31G X 5 MM MISC Use as directed with tresiba . 100 each 1   Insulin  Pen Needle 31G X 5 MM MISC Use as directed daily with Tresiba . 100 each 1   lansoprazole  (PREVACID ) 30 MG capsule Take 1 capsule (30 mg total) by mouth 2 (two) times daily. 180 capsule 3   loratadine -pseudoephedrine (ALAVERT  D-12 HOUR ALLERGY/CONG) 5-120 MG tablet Take 1 tablet by mouth at bedtime.      losartan  (COZAAR ) 25 MG tablet Take 1 tablet (25 mg total) by mouth daily for kidney protection. 90 tablet 3   Meclizine  HCl 25 MG CHEW 1 tablet as needed Orally every 12 hrs (Patient taking differently: As needed for vertigo) 30 tablet 1   Melatonin 5 MG CHEW Chew 5 mg by mouth at bedtime.     metFORMIN  (GLUCOPHAGE -XR) 500 MG 24 hr tablet Take 2 tablets (1,000 mg total) by mouth 2 (two) times daily. 360 tablet 3   Probiotic Product (RA PROBIOTIC GUMMIES PO) Take 1 capsule by mouth at bedtime.     sertraline  (ZOLOFT ) 100 MG tablet Take 1 tablet (100 mg total) by mouth daily. 90 tablet 2   simvastatin  (ZOCOR ) 20 MG tablet Take 1 tablet (20 mg total) by mouth every evening. 90 tablet 3   sodium bicarbonate  650 MG tablet Take 1 tablet (650 mg total)  by mouth 2 (two) times daily. 180 tablet 2   tamsulosin  (FLOMAX ) 0.4 MG CAPS capsule Take 1 capsule (0.4 mg total) by mouth at bedtime. 90 capsule 3   No current facility-administered medications for this visit.    Allergies as of 12/01/2024 - Review Complete 12/01/2024  Allergen Reaction Noted   Aspirin Anaphylaxis and Hives 03/16/2008   Shrimp [shellfish allergy] Hives 05/21/2015    Family History  Problem Relation Age of Onset   Colon polyps Father    Lung cancer Father    Diabetes Mother    Dementia Mother    Diabetes Sister        x 2   Stroke Maternal Grandmother    Stroke Paternal Grandfather    Colon cancer Neg Hx    Rectal cancer Neg Hx    Stomach cancer Neg Hx    Pancreatic cancer Neg Hx     Social History[1]   Review of Systems:    Constitutional: No weight loss, fever, chills Cardiovascular: No chest pain Respiratory: No SOB  Gastrointestinal: See HPI and otherwise negative   Physical Exam:  Vital signs: BP 122/78   Pulse 96   Ht 5' 8 (1.727 m)   Wt 179 lb 6.4 oz (81.4 kg)   BMI 27.28 kg/m   Wt Readings from Last 3 Encounters:  12/01/24 179 lb 6.4 oz (81.4 kg)  11/16/24 176 lb (79.8 kg)  10/24/24 180 lb  (81.6 kg)     Constitutional: Pleasant, overweight male in NAD, alert and cooperative Head:  Normocephalic and atraumatic.  Respiratory: Respirations even and unlabored. Lungs clear to auscultation bilaterally.  No wheezes, crackles, or rhonchi.  Cardiovascular:  Regular rate and rhythm. No murmurs. No peripheral edema. Gastrointestinal:  Soft, nondistended, nontender. No rebound or guarding. Normal bowel sounds. No appreciable masses or hepatomegaly. Rectal:  Not performed.  Neurologic:  Alert and oriented x4;  grossly normal neurologically.  Skin:   Dry and intact without significant lesions or rashes. Psychiatric: Oriented to person, place and time. Demonstrates good judgement and reason without abnormal affect or behaviors.   Echocardiogram 11/27/2022 1. Left ventricular ejection fraction, by estimation, is 65 to 70% . The left ventricle has normal function. The left ventricle has no regional wall motion abnormalities. There is mild left ventricular hypertrophy. Left ventricular diastolic parameters are consistent with Grade I diastolic dysfunction (impaired relaxation) .  2. Right ventricular systolic function is normal. The right ventricular size is normal. Tricuspid regurgitation signal is inadequate for assessing PA pressure.  3. The mitral valve is normal in structure. No evidence of mitral valve regurgitation. No evidence of mitral stenosis.  4. The aortic valve is tricuspid. Aortic valve regurgitation is not visualized. No aortic stenosis is present.  5. Aortic dilatation noted. There is mild dilatation of the aortic root, measuring 40 mm.  6. The inferior vena cava is normal in size with greater than 50% respiratory variability, suggesting right atrial pressure of 3 mmHg.   Assessment/Plan:   Assessment & Plan Pancreatic lesion (benign neoplasm of pancreas) Asymptomatic, stable on MRI, low risk for progression.  Repeat MRI recommended in 1 year to monitor stability.  - Repeat  MRCP in one year. - Follow-up in office in 1 year  Iron deficiency anemia Hepatic and splenic iron deposition Iron deposition likely from prior infusions, ferritin elevated but decreasing, asymptomatic.  Follows with hematology, recently seen and had repeat labs showing stable CBC, iron panel.  Elevated ferritin but it is downtrending.    -  Advised patient to contact hematology regarding recent MRI findings to determine if chelation/phlebotomy needed, otherwise continue to monitor. - Check hepatic function panel today  History of adenomatous colon polyps Due for colonoscopy in March, prior prep inadequate.  Will need extended prep.  - Scheduled colonoscopy. I thoroughly discussed the procedure with the patient to include nature of the procedure, alternatives, benefits, and risks (including but not limited to bleeding, infection, perforation, anesthesia/cardiac/pulmonary complications). Patient verbalized understanding and gave verbal consent to proceed with procedure.  - Request Plavix  hold    Camie Furbish, PA-C [facility] 12/01/2024, 9:02 AM  Patient Care Team: Vernon Velna SAUNDERS, MD as PCP - General (Internal Medicine)       [1]  Social History Tobacco Use   Smoking status: Former    Current packs/day: 0.00    Types: Cigarettes    Quit date: 10/04/1997    Years since quitting: 27.1   Smokeless tobacco: Never  Vaping Use   Vaping status: Never Used  Substance Use Topics   Alcohol use: No   Drug use: No   "

## 2024-12-01 NOTE — Telephone Encounter (Signed)
 Sent patient MyChart message with appointment info for colonoscopy and PV appointment. Patient was instructed to call if he needs to reschedule.  Patient read the message
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
## 2024-12-01 NOTE — Telephone Encounter (Signed)
 Letter faxed to Dr. Vernon at [phone].  Ph: [phone]   12/01/2024   Adam Quinn 02/11/1956 [id number]  Dear Dr. Vernon [facility]):  We have scheduled the above named patient for a colonoscopy procedure. Our records show that he is on anticoagulation therapy.  Please advise as to whether the patient may come off their therapy of PLAVIX  5 days prior to their procedure which is scheduled for 02-09-25 with Dr. Shila.  Please route your response to Madison Favre, CMA or fax response to [phone]. Thank you for your prompt reply.  Sincerely,    [facility]

## 2024-12-01 NOTE — Patient Instructions (Addendum)
 Please go to the lab in the basement of our building to have lab work done as you leave today. Hit B for basement when you get on the elevator.  When the doors open the lab is on your left.  We will call you with the results. Thank you.   You have been scheduled for a colonoscopy with Dr. Nandigam on 02-01-25. Please follow written instructions given to you at your visit today.   If you use inhalers (even only as needed), please bring them with you on the day of your procedure.  DO NOT TAKE 7 DAYS PRIOR TO TEST- Trulicity  (dulaglutide ) Ozempic, Wegovy (semaglutide) Mounjaro, Zepbound (tirzepatide) Bydureon Bcise (exanatide extended release)  DO NOT TAKE 1 DAY PRIOR TO YOUR TEST Rybelsus (semaglutide) Adlyxin (lixisenatide) Victoza (liraglutide) Byetta (exanatide) ___________________________________________________________________________  Rosine will be contacted by our office prior to your procedure for directions on holding your Plavix .  If you do not hear from our office 1 week prior to your scheduled procedure, please call [phone] to discuss.    You will be due for a repeat MRCP in December 2026.  Please contact your Hematologist regarding MRI findings.  Thank you for entrusting me with your care and for choosing [facility], [place], [place]  _______________________________________________________  If your blood pressure at your visit was 140/90 or greater, please contact your primary care physician to follow up on this.  _______________________________________________________  If you are age 60 or older, your body mass index should be between 23-30. Your Body mass index is 27.28 kg/m. If this is out of the aforementioned range listed, please consider follow up with your Primary Care Provider.  If you are age 28 or younger, your body mass index should be between 19-25. Your Body mass index is 27.28 kg/m. If this is out of the aformentioned range listed, please  consider follow up with your Primary Care Provider.   ________________________________________________________  The [facility] providers would like to encourage you to use MYCHART to communicate with providers for non-urgent requests or questions.  Due to long hold times on the telephone, sending your provider a message by [facility] may be a faster and more efficient way to get a response.  Please allow 48 business hours for a response.  Please remember that this is for non-urgent requests.  _______________________________________________________  [facility] is using a team-based approach to care.  Your team is made up of your doctor and two to three APPS. Our APPS (Nurse Practitioners and Physician Assistants) work with your physician to ensure care continuity for you. They are fully qualified to address your health concerns and develop a treatment plan. They communicate directly with your gastroenterologist to care for you. Seeing the Advanced Practice Practitioners on your physician's team can help you by facilitating care more promptly, often allowing for earlier appointments, access to diagnostic testing, procedures, and other specialty referrals.

## 2024-12-02 ENCOUNTER — Other Ambulatory Visit (HOSPITAL_BASED_OUTPATIENT_CLINIC_OR_DEPARTMENT_OTHER): Payer: Self-pay

## 2024-12-02 MED ORDER — LUMIGAN 0.01 % OP SOLN
1.0000 [drp] | Freq: Every day | OPHTHALMIC | 3 refills | Status: AC
  Filled 2024-12-02: qty 5, 90d supply, fill #0

## 2024-12-04 ENCOUNTER — Encounter: Payer: Self-pay | Admitting: Gastroenterology

## 2024-12-04 ENCOUNTER — Telehealth: Payer: Self-pay | Admitting: Gastroenterology

## 2024-12-04 NOTE — Telephone Encounter (Signed)
 Please make sure this patient does extended/2-day prep for his colonoscopy, thank you

## 2024-12-05 ENCOUNTER — Other Ambulatory Visit (HOSPITAL_BASED_OUTPATIENT_CLINIC_OR_DEPARTMENT_OTHER): Payer: Self-pay

## 2024-12-06 NOTE — Telephone Encounter (Signed)
 Called Dr. Everlina office at [place].  They indicated that patient has an appointment next week on 1-21 and it will be addressed then.  They asked that I refax the request to fax [phone]

## 2024-12-06 NOTE — Telephone Encounter (Signed)
 Refaxed request to [phone]

## 2024-12-07 ENCOUNTER — Other Ambulatory Visit (HOSPITAL_BASED_OUTPATIENT_CLINIC_OR_DEPARTMENT_OTHER): Payer: Self-pay

## 2024-12-07 MED ORDER — GABAPENTIN 300 MG PO CAPS
300.0000 mg | ORAL_CAPSULE | Freq: Every day | ORAL | 3 refills | Status: AC
  Filled 2024-12-07: qty 30, 30d supply, fill #0

## 2024-12-20 NOTE — Telephone Encounter (Signed)
 Called Dr. Adin office and spoke to [place].  She will send a note to her nurse to check on the request and get back to us  with an answer.

## 2024-12-20 NOTE — Telephone Encounter (Signed)
 Received a call back from Dr. Everlina office. They said they faxed the approval for the request to hold Plavix  2 weeks ago.  They will refax to [phone], attn Jan

## 2024-12-20 NOTE — Telephone Encounter (Signed)
 Received fax that patient has been approved for the procedure and has been cleared to hold Plavix  for 5 days prior to procedure.  Called and spoke to patient.  He understands and will mark his calendar.  He has a PV in early March.

## 2024-12-27 ENCOUNTER — Other Ambulatory Visit (HOSPITAL_BASED_OUTPATIENT_CLINIC_OR_DEPARTMENT_OTHER): Payer: Self-pay

## 2024-12-27 ENCOUNTER — Other Ambulatory Visit: Payer: Self-pay

## 2024-12-27 MED ORDER — FREESTYLE LIBRE 3 SENSOR MISC
3 refills | Status: AC
  Filled 2024-12-27: qty 2, 30d supply, fill #0

## 2024-12-29 ENCOUNTER — Other Ambulatory Visit (HOSPITAL_COMMUNITY): Payer: Self-pay | Admitting: Internal Medicine

## 2024-12-29 DIAGNOSIS — Z136 Encounter for screening for cardiovascular disorders: Secondary | ICD-10-CM

## 2025-01-23 ENCOUNTER — Encounter

## 2025-02-01 ENCOUNTER — Encounter: Admitting: Gastroenterology

## 2025-02-09 ENCOUNTER — Encounter (HOSPITAL_COMMUNITY): Payer: Self-pay

## 2025-02-09 ENCOUNTER — Ambulatory Visit (HOSPITAL_COMMUNITY): Admit: 2025-02-09 | Admitting: Gastroenterology

## 2025-02-09 SURGERY — COLONOSCOPY
Anesthesia: Monitor Anesthesia Care

## 2025-02-13 ENCOUNTER — Ambulatory Visit: Admitting: Podiatry

## 2025-02-14 ENCOUNTER — Inpatient Hospital Stay: Admitting: Family

## 2025-02-14 ENCOUNTER — Inpatient Hospital Stay
